# Patient Record
Sex: Male | Born: 1950 | Race: White | Smoking: Current every day smoker
Health system: Southern US, Community
[De-identification: ages and names within clinical notes are randomized; demographics above are authoritative.]

## PROBLEM LIST (undated history)

## (undated) DIAGNOSIS — F329 Major depressive disorder, single episode, unspecified: Secondary | ICD-10-CM

## (undated) DIAGNOSIS — M5386 Other specified dorsopathies, lumbar region: Secondary | ICD-10-CM

## (undated) DIAGNOSIS — K922 Gastrointestinal hemorrhage, unspecified: Secondary | ICD-10-CM

## (undated) DIAGNOSIS — F32A Depression, unspecified: Secondary | ICD-10-CM

## (undated) DIAGNOSIS — M62838 Other muscle spasm: Secondary | ICD-10-CM

## (undated) DIAGNOSIS — F1010 Alcohol abuse, uncomplicated: Secondary | ICD-10-CM

## (undated) DIAGNOSIS — M489 Spondylopathy, unspecified: Secondary | ICD-10-CM

## (undated) DIAGNOSIS — N189 Chronic kidney disease, unspecified: Secondary | ICD-10-CM

## (undated) DIAGNOSIS — F419 Anxiety disorder, unspecified: Secondary | ICD-10-CM

## (undated) DIAGNOSIS — N400 Enlarged prostate without lower urinary tract symptoms: Secondary | ICD-10-CM

## (undated) HISTORY — PX: ELBOW SURGERY: SHX618

## (undated) HISTORY — DX: Anxiety disorder, unspecified: F41.9

## (undated) HISTORY — DX: Other specified dorsopathies, lumbar region: M53.86

## (undated) HISTORY — DX: Chronic kidney disease, unspecified: N18.9

## (undated) HISTORY — DX: Spondylopathy, unspecified: M48.9

---

## 2012-07-04 ENCOUNTER — Other Ambulatory Visit: Payer: Self-pay | Admitting: Specialist

## 2012-07-04 DIAGNOSIS — M545 Low back pain: Secondary | ICD-10-CM

## 2012-07-04 DIAGNOSIS — M79606 Pain in leg, unspecified: Secondary | ICD-10-CM

## 2012-07-10 ENCOUNTER — Other Ambulatory Visit: Payer: Self-pay

## 2012-07-10 ENCOUNTER — Ambulatory Visit
Admission: RE | Admit: 2012-07-10 | Discharge: 2012-07-10 | Disposition: A | Discharge status: Home / Self Care | Payer: Worker's Compensation | Source: Ambulatory Visit | Attending: Specialist | Admitting: Specialist

## 2012-07-10 DIAGNOSIS — M79606 Pain in leg, unspecified: Secondary | ICD-10-CM

## 2012-07-10 DIAGNOSIS — M545 Low back pain: Secondary | ICD-10-CM

## 2012-07-15 ENCOUNTER — Ambulatory Visit
Admission: RE | Admit: 2012-07-15 | Discharge: 2012-07-15 | Disposition: A | Discharge status: Home / Self Care | Payer: Worker's Compensation | Source: Ambulatory Visit | Attending: Specialist | Admitting: Specialist

## 2012-07-15 VITALS — BP 138/70 | HR 73

## 2012-07-15 DIAGNOSIS — M542 Cervicalgia: Secondary | ICD-10-CM

## 2012-07-15 DIAGNOSIS — M549 Dorsalgia, unspecified: Secondary | ICD-10-CM

## 2012-07-15 MED ORDER — DIAZEPAM 5 MG PO TABS
10.0000 mg | ORAL_TABLET | Freq: Once | ORAL | Status: AC
  Administered 2012-07-15: 10 mg via ORAL

## 2012-07-15 MED ORDER — IOHEXOL 300 MG/ML  SOLN
10.0000 mL | Freq: Once | INTRAMUSCULAR | Status: AC | PRN
  Administered 2012-07-15: 10 mL via INTRATHECAL

## 2012-07-15 MED ORDER — HYDROXYZINE HCL 50 MG/ML IM SOLN
25.0000 mg | Freq: Four times a day (QID) | INTRAMUSCULAR | Status: DC | PRN
  Administered 2012-07-15: 25 mg via INTRAMUSCULAR

## 2012-07-15 MED ORDER — HYDROMORPHONE HCL PF 2 MG/ML IJ SOLN
2.0000 mg | Freq: Once | INTRAMUSCULAR | Status: AC
  Administered 2012-07-15: 2 mg via INTRAMUSCULAR

## 2013-08-03 ENCOUNTER — Other Ambulatory Visit: Payer: Self-pay | Admitting: Neurology

## 2013-08-03 DIAGNOSIS — M542 Cervicalgia: Secondary | ICD-10-CM

## 2013-08-11 ENCOUNTER — Other Ambulatory Visit: Payer: Self-pay | Admitting: Neurology

## 2013-08-11 ENCOUNTER — Ambulatory Visit
Admission: RE | Admit: 2013-08-11 | Discharge: 2013-08-11 | Disposition: A | Discharge status: Home / Self Care | Payer: Worker's Compensation | Source: Ambulatory Visit | Attending: Neurology | Admitting: Neurology

## 2013-08-11 DIAGNOSIS — M542 Cervicalgia: Secondary | ICD-10-CM

## 2013-08-11 MED ORDER — IOHEXOL 300 MG/ML  SOLN
1.0000 mL | Freq: Once | INTRAMUSCULAR | Status: AC | PRN
  Administered 2013-08-11: 1 mL via INTRAVENOUS

## 2013-08-11 MED ORDER — DEXAMETHASONE SODIUM PHOSPHATE 4 MG/ML IJ SOLN
6.0000 mg | Freq: Once | INTRAMUSCULAR | Status: AC
  Administered 2013-08-11: 6 mg via INTRA_ARTICULAR

## 2015-03-31 DIAGNOSIS — H2513 Age-related nuclear cataract, bilateral: Secondary | ICD-10-CM | POA: Diagnosis not present

## 2015-03-31 DIAGNOSIS — H524 Presbyopia: Secondary | ICD-10-CM | POA: Diagnosis not present

## 2015-03-31 DIAGNOSIS — H52203 Unspecified astigmatism, bilateral: Secondary | ICD-10-CM | POA: Diagnosis not present

## 2015-03-31 DIAGNOSIS — H5203 Hypermetropia, bilateral: Secondary | ICD-10-CM | POA: Diagnosis not present

## 2015-04-11 ENCOUNTER — Other Ambulatory Visit: Payer: Self-pay | Admitting: Neurology

## 2015-04-11 DIAGNOSIS — H5462 Unqualified visual loss, left eye, normal vision right eye: Secondary | ICD-10-CM

## 2015-04-13 ENCOUNTER — Ambulatory Visit
Admission: RE | Admit: 2015-04-13 | Discharge: 2015-04-13 | Disposition: A | Discharge status: Home / Self Care | Payer: Medicare Other | Source: Ambulatory Visit | Attending: Neurology | Admitting: Neurology

## 2015-04-13 DIAGNOSIS — I6523 Occlusion and stenosis of bilateral carotid arteries: Secondary | ICD-10-CM | POA: Diagnosis not present

## 2015-04-13 DIAGNOSIS — H5462 Unqualified visual loss, left eye, normal vision right eye: Secondary | ICD-10-CM

## 2015-10-17 ENCOUNTER — Encounter: Payer: Self-pay | Admitting: Family Medicine

## 2015-10-17 ENCOUNTER — Ambulatory Visit (INDEPENDENT_AMBULATORY_CARE_PROVIDER_SITE_OTHER): Payer: Medicare Other | Admitting: Physician Assistant

## 2015-10-17 ENCOUNTER — Encounter: Payer: Self-pay | Admitting: Physician Assistant

## 2015-10-17 VITALS — BP 132/76 | HR 84 | Temp 98.2°F | Resp 18 | Ht 74.0 in | Wt 237.0 lb

## 2015-10-17 DIAGNOSIS — N529 Male erectile dysfunction, unspecified: Secondary | ICD-10-CM

## 2015-10-17 DIAGNOSIS — Z720 Tobacco use: Secondary | ICD-10-CM | POA: Diagnosis not present

## 2015-10-17 DIAGNOSIS — Z125 Encounter for screening for malignant neoplasm of prostate: Secondary | ICD-10-CM | POA: Diagnosis not present

## 2015-10-17 DIAGNOSIS — Z0000 Encounter for general adult medical examination without abnormal findings: Secondary | ICD-10-CM

## 2015-10-17 DIAGNOSIS — M5386 Other specified dorsopathies, lumbar region: Secondary | ICD-10-CM | POA: Insufficient documentation

## 2015-10-17 DIAGNOSIS — Z7189 Other specified counseling: Secondary | ICD-10-CM

## 2015-10-17 DIAGNOSIS — G894 Chronic pain syndrome: Secondary | ICD-10-CM | POA: Diagnosis not present

## 2015-10-17 DIAGNOSIS — Z23 Encounter for immunization: Secondary | ICD-10-CM | POA: Diagnosis not present

## 2015-10-17 DIAGNOSIS — R5383 Other fatigue: Secondary | ICD-10-CM | POA: Diagnosis not present

## 2015-10-17 DIAGNOSIS — Z7689 Persons encountering health services in other specified circumstances: Secondary | ICD-10-CM

## 2015-10-17 DIAGNOSIS — F419 Anxiety disorder, unspecified: Secondary | ICD-10-CM | POA: Insufficient documentation

## 2015-10-17 DIAGNOSIS — M489 Spondylopathy, unspecified: Secondary | ICD-10-CM | POA: Insufficient documentation

## 2015-10-17 DIAGNOSIS — F17200 Nicotine dependence, unspecified, uncomplicated: Secondary | ICD-10-CM | POA: Insufficient documentation

## 2015-10-17 LAB — CBC WITH DIFFERENTIAL/PLATELET
Basophils Absolute: 0.1 10*3/uL (ref 0.0–0.1)
Basophils Relative: 1 % (ref 0–1)
EOS ABS: 0.3 10*3/uL (ref 0.0–0.7)
EOS PCT: 3 % (ref 0–5)
HEMATOCRIT: 48.3 % (ref 39.0–52.0)
Hemoglobin: 16.7 g/dL (ref 13.0–17.0)
LYMPHS PCT: 43 % (ref 12–46)
Lymphs Abs: 4.1 10*3/uL — ABNORMAL HIGH (ref 0.7–4.0)
MCH: 31.4 pg (ref 26.0–34.0)
MCHC: 34.6 g/dL (ref 30.0–36.0)
MCV: 90.8 fL (ref 78.0–100.0)
MONO ABS: 0.6 10*3/uL (ref 0.1–1.0)
MPV: 9.2 fL (ref 8.6–12.4)
Monocytes Relative: 6 % (ref 3–12)
Neutro Abs: 4.5 10*3/uL (ref 1.7–7.7)
Neutrophils Relative %: 47 % (ref 43–77)
Platelets: 213 10*3/uL (ref 150–400)
RBC: 5.32 MIL/uL (ref 4.22–5.81)
RDW: 13.4 % (ref 11.5–15.5)
WBC: 9.5 10*3/uL (ref 4.0–10.5)

## 2015-10-17 LAB — COMPLETE METABOLIC PANEL WITH GFR
ALT: 16 U/L (ref 9–46)
AST: 16 U/L (ref 10–35)
Albumin: 4.3 g/dL (ref 3.6–5.1)
Alkaline Phosphatase: 53 U/L (ref 40–115)
BUN: 10 mg/dL (ref 7–25)
CALCIUM: 9.5 mg/dL (ref 8.6–10.3)
CHLORIDE: 102 mmol/L (ref 98–110)
CO2: 26 mmol/L (ref 20–31)
Creat: 0.99 mg/dL (ref 0.70–1.25)
GFR, Est Non African American: 80 mL/min (ref >=60)
Glucose, Bld: 102 mg/dL — ABNORMAL HIGH (ref 70–99)
POTASSIUM: 4.4 mmol/L (ref 3.5–5.3)
Sodium: 137 mmol/L (ref 135–146)
Total Bilirubin: 0.6 mg/dL (ref 0.2–1.2)
Total Protein: 7.1 g/dL (ref 6.1–8.1)

## 2015-10-17 LAB — LIPID PANEL
CHOLESTEROL: 196 mg/dL (ref 125–200)
HDL: 29 mg/dL — AB (ref >=40)
LDL CALC: 98 mg/dL (ref <130)
TRIGLYCERIDES: 344 mg/dL — AB (ref <150)
Total CHOL/HDL Ratio: 6.8 Ratio — ABNORMAL HIGH (ref <=5.0)
VLDL: 69 mg/dL — AB (ref <30)

## 2015-10-17 LAB — TSH: TSH: 0.95 m[IU]/L (ref 0.40–4.50)

## 2015-10-17 MED ORDER — VARENICLINE TARTRATE 1 MG PO TABS: 1.0000 mg | ORAL_TABLET | Freq: Two times a day (BID) | ORAL | Status: DC

## 2015-10-17 MED ORDER — VARENICLINE TARTRATE 0.5 MG PO TABS: 0.5000 mg | ORAL_TABLET | Freq: Two times a day (BID) | ORAL | Status: DC
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
## 2015-10-18 LAB — VITAMIN D 25 HYDROXY (VIT D DEFICIENCY, FRACTURES): Vit D, 25-Hydroxy: 14 ng/mL — ABNORMAL LOW (ref 30–100)

## 2015-10-18 LAB — PSA, MEDICARE: PSA: 3.69 ng/mL (ref <=4.00)

## 2015-10-18 LAB — TESTOSTERONE: Testosterone: 224 ng/dL — ABNORMAL LOW (ref 250–827)

## 2015-10-21 ENCOUNTER — Other Ambulatory Visit: Payer: Medicare Other

## 2015-10-21 ENCOUNTER — Encounter: Payer: Self-pay | Admitting: Family Medicine

## 2015-10-21 DIAGNOSIS — E559 Vitamin D deficiency, unspecified: Secondary | ICD-10-CM

## 2015-10-21 DIAGNOSIS — E291 Testicular hypofunction: Secondary | ICD-10-CM | POA: Diagnosis not present

## 2015-10-21 DIAGNOSIS — R7989 Other specified abnormal findings of blood chemistry: Secondary | ICD-10-CM

## 2015-10-21 LAB — PROLACTIN: Prolactin: 6.1 ng/mL (ref 2.0–18.0)

## 2015-10-21 LAB — TESTOSTERONE: Testosterone: 241 ng/dL — ABNORMAL LOW (ref 250–827)

## 2015-10-21 LAB — FSH/LH
FSH: 8.6 m[IU]/mL — AB (ref 1.6–8.0)
LH: 9.4 m[IU]/mL (ref 1.6–15.2)

## 2015-10-26 ENCOUNTER — Other Ambulatory Visit: Payer: Self-pay | Admitting: Family Medicine

## 2015-10-26 ENCOUNTER — Encounter: Payer: Self-pay | Admitting: Gastroenterology

## 2015-10-26 DIAGNOSIS — E349 Endocrine disorder, unspecified: Secondary | ICD-10-CM | POA: Insufficient documentation

## 2015-10-26 MED ORDER — TESTOSTERONE 20.25 MG/ACT (1.62%) TD GEL: TRANSDERMAL | Status: DC

## 2015-11-30 ENCOUNTER — Telehealth: Payer: Self-pay | Admitting: Family Medicine

## 2015-11-30 MED ORDER — DIAZEPAM 5 MG PO TABS: 5.0000 mg | ORAL_TABLET | Freq: Three times a day (TID) | ORAL | Status: DC | PRN

## 2015-11-30 NOTE — Telephone Encounter (Signed)
Daughter calling, patient's wife just passed and funeral is toady.  Barry Mccoy is very distraught.  Can we order something to help calm him through this time?

## 2015-11-30 NOTE — Telephone Encounter (Signed)
Daughter aware of RX.  Extended our condolences.

## 2015-11-30 NOTE — Telephone Encounter (Signed)
Rx has been called in, left message for daughter to call back.

## 2015-11-30 NOTE — Telephone Encounter (Signed)
Valium 5 mg 1 po Q 8 hours PRN # 40 + 0 Tell him to take each night to help with sleep and PRN during day.  Sorry for his loss.

## 2015-12-02 ENCOUNTER — Other Ambulatory Visit: Payer: Self-pay | Admitting: *Deleted

## 2015-12-02 DIAGNOSIS — E349 Endocrine disorder, unspecified: Secondary | ICD-10-CM

## 2015-12-02 NOTE — Telephone Encounter (Signed)
Pt came into office stating he has been presribe Androgel and instructions states 4 pumps a day which only gives him enough for 15 days. Pt wants to know if you prescribe him a 90 day supply.   Pharmacy [place] [facility]

## 2015-12-05 ENCOUNTER — Ambulatory Visit (AMBULATORY_SURGERY_CENTER): Payer: Self-pay | Admitting: *Deleted

## 2015-12-05 VITALS — Ht 74.0 in | Wt 243.0 lb

## 2015-12-05 DIAGNOSIS — Z800 Family history of malignant neoplasm of digestive organs: Secondary | ICD-10-CM

## 2015-12-05 DIAGNOSIS — Z1211 Encounter for screening for malignant neoplasm of colon: Secondary | ICD-10-CM

## 2015-12-05 MED ORDER — NA SULFATE-K SULFATE-MG SULF 17.5-3.13-1.6 GM/177ML PO SOLN: 1.0000 | Freq: Once | ORAL | Status: DC

## 2015-12-05 NOTE — Progress Notes (Signed)
No egg or soy allergy known to patient  No issues with past sedation with any surgeries  or procedures, no intubation problems -only surgery was as a child No diet pills per patient No home 02 use per patient  No blood thinners per patient  Pt denies issues with constipation

## 2015-12-05 NOTE — Telephone Encounter (Signed)
OK to send 90 day supply 

## 2015-12-06 MED ORDER — TESTOSTERONE 20.25 MG/ACT (1.62%) TD GEL: TRANSDERMAL | Status: DC

## 2015-12-06 NOTE — Telephone Encounter (Signed)
Approved.  

## 2015-12-06 NOTE — Telephone Encounter (Signed)
rx printed, and will fax after signed in AM

## 2015-12-14 ENCOUNTER — Encounter: Payer: Self-pay | Admitting: Gastroenterology

## 2015-12-14 ENCOUNTER — Ambulatory Visit (AMBULATORY_SURGERY_CENTER): Payer: Medicare Other | Admitting: Gastroenterology

## 2015-12-14 VITALS — BP 90/58 | HR 72 | Temp 98.6°F | Resp 19 | Ht 74.0 in | Wt 243.0 lb

## 2015-12-14 DIAGNOSIS — D120 Benign neoplasm of cecum: Secondary | ICD-10-CM

## 2015-12-14 DIAGNOSIS — D125 Benign neoplasm of sigmoid colon: Secondary | ICD-10-CM | POA: Diagnosis not present

## 2015-12-14 DIAGNOSIS — Z800 Family history of malignant neoplasm of digestive organs: Secondary | ICD-10-CM

## 2015-12-14 DIAGNOSIS — Z1211 Encounter for screening for malignant neoplasm of colon: Secondary | ICD-10-CM

## 2015-12-14 DIAGNOSIS — N189 Chronic kidney disease, unspecified: Secondary | ICD-10-CM | POA: Diagnosis not present

## 2015-12-14 DIAGNOSIS — D123 Benign neoplasm of transverse colon: Secondary | ICD-10-CM | POA: Diagnosis not present

## 2015-12-14 DIAGNOSIS — G8929 Other chronic pain: Secondary | ICD-10-CM | POA: Diagnosis not present

## 2015-12-14 MED ORDER — SODIUM CHLORIDE 0.9 % IV SOLN: 500.0000 mL | INTRAVENOUS | Status: DC

## 2015-12-14 NOTE — Progress Notes (Signed)
Called to room to assist during endoscopic procedure.  Patient ID and intended procedure confirmed with present staff. Received instructions for my participation in the procedure from the performing physician.  

## 2015-12-14 NOTE — Progress Notes (Signed)
To pacu vss patent aw reprot to rn 

## 2015-12-14 NOTE — Patient Instructions (Signed)
YOU HAD AN ENDOSCOPIC PROCEDURE TODAY AT [facility]:   Refer to the procedure report that was given to you for any specific questions about what was found during the examination.  If the procedure report does not answer your questions, please call your gastroenterologist to clarify.  If you requested that your care partner not be given the details of your procedure findings, then the procedure report has been included in a sealed envelope for you to review at your convenience later.  YOU SHOULD EXPECT: Some feelings of bloating in the abdomen. Passage of more gas than usual.  Walking can help get rid of the air that was put into your GI tract during the procedure and reduce the bloating. If you had a lower endoscopy (such as a colonoscopy or flexible sigmoidoscopy) you may notice spotting of blood in your stool or on the toilet paper. If you underwent a bowel prep for your procedure, you may not have a normal bowel movement for a few days.  Please Note:  You might notice some irritation and congestion in your nose or some drainage.  This is from the oxygen used during your procedure.  There is no need for concern and it should clear up in a day or so.  SYMPTOMS TO REPORT IMMEDIATELY:   Following lower endoscopy (colonoscopy or flexible sigmoidoscopy):  Excessive amounts of blood in the stool  Significant tenderness or worsening of abdominal pains  Swelling of the abdomen that is new, acute  Fever of 100F or higher   For urgent or emergent issues, a gastroenterologist can be reached at any hour by calling [phone].   DIET: Your first meal following the procedure should be a small meal and then it is ok to progress to your normal diet. Heavy or fried foods are harder to digest and may make you feel nauseous or bloated.  Likewise, meals heavy in dairy and vegetables can increase bloating.  Drink plenty of fluids but you should avoid alcoholic beverages for 24  hours.  ACTIVITY:  You should plan to take it easy for the rest of today and you should NOT DRIVE or use heavy machinery until tomorrow (because of the sedation medicines used during the test).    FOLLOW UP: Our staff will call the number listed on your records the next business day following your procedure to check on you and address any questions or concerns that you may have regarding the information given to you following your procedure. If we do not reach you, we will leave a message.  However, if you are feeling well and you are not experiencing any problems, there is no need to return our call.  We will assume that you have returned to your regular daily activities without incident.  If any biopsies were taken you will be contacted by phone or by letter within the next 1-3 weeks.  Please call us at [phone] if you have not heard about the biopsies in 3 weeks.    SIGNATURES/CONFIDENTIALITY: You and/or your care partner have signed paperwork which will be entered into your electronic medical record.  These signatures attest to the fact that that the information above on your After Visit Summary has been reviewed and is understood.  Full responsibility of the confidentiality of this discharge information lies with you and/or your care-partner.  4 polyps removed and sent to pathology.  Await pathology for final recommendations.   No Aspirin or aspirin containing products for 2 weeks.

## 2015-12-14 NOTE — Op Note (Signed)
[facility] Patient Name: Hoyle Montero Procedure Date: 12/14/2015 11:44 AM MRN: [id number] Endoscopist: Remo Lipps P. Havery Moros , MD Age: 65 Referring MD:  Date of Birth: 01-07-1951 Gender: Male Procedure:                Colonoscopy Indications:              Screening in patient at increased risk: Family                            history of 1st-degree relative with colorectal                            cancer (mother around age 22), first time exam Medicines:                Monitored Anesthesia Care Procedure:                Pre-Anesthesia Assessment:                           - Prior to the procedure, a History and Physical                            was performed, and patient medications and                            allergies were reviewed. The patient's tolerance of                            previous anesthesia was also reviewed. The risks                            and benefits of the procedure and the sedation                            options and risks were discussed with the patient.                            All questions were answered, and informed consent                            was obtained. Prior Anticoagulants: The patient has                            taken no previous anticoagulant or antiplatelet                            agents. ASA Grade Assessment: III - A patient with                            severe systemic disease. After reviewing the risks                            and benefits, the patient was deemed in  satisfactory condition to undergo the procedure.                           After obtaining informed consent, the colonoscope                            was passed under direct vision. Throughout the                            procedure, the patient's blood pressure, pulse, and                            oxygen saturations were monitored continuously. The                            Model CF-HQ190L [id number]) scope  was introduced                            through the anus and advanced to the the cecum,                            identified by appendiceal orifice and ileocecal                            valve. The colonoscopy was performed without                            difficulty. The patient tolerated the procedure                            well. The quality of the bowel preparation was                            adequate. The ileocecal valve, appendiceal orifice,                            and rectum were photographed. Scope In: 11:54:15 AM Scope Out: 12:15:31 PM Scope Withdrawal Time: 0 hours 18 minutes 39 seconds  Total Procedure Duration: 0 hours 21 minutes 16 seconds  Findings:      The perianal and digital rectal examinations were normal.      A 5 mm polyp was found in the cecum. The polyp was sessile. The polyp       was removed with a cold snare. Resection and retrieval were complete.      A 10 mm polyp was found in the hepatic flexure. The polyp was sessile.       The polyp was removed with a cold snare. Resection and retrieval were       complete.      A 3 mm polyp was found in the transverse colon. The polyp was sessile.       The polyp was removed with a cold biopsy forceps. Resection and       retrieval were complete.      A 5 mm polyp was found in the sigmoid colon. The polyp was sessile. The  polyp was removed with a cold snare. Resection and retrieval were       complete.      Multiple small and large-mouthed diverticula were found in the left       colon. A few isolated diverticuli were noted in the right colon      Non-bleeding internal hemorrhoids were found during retroflexion. The       hemorrhoids were moderate.      The exam was otherwise normal throughout the examined colon. Complications:            No immediate complications. Estimated blood loss:                            Minimal. Estimated Blood Loss:     Estimated blood loss was minimal. Impression:                - One 5 mm polyp in the cecum, removed with a cold                            snare. Resected and retrieved.                           - One 10 mm polyp at the hepatic flexure, removed                            with a cold snare. Resected and retrieved.                           - One 3 mm polyp in the transverse colon, removed                            with a cold biopsy forceps. Resected and retrieved.                           - One 5 mm polyp in the sigmoid colon, removed with                            a cold snare. Resected and retrieved.                           - Diverticulosis in the left colon and right colon,                            highest burden in left colon                           - Non-bleeding internal hemorrhoids. Recommendation:           - Patient has a contact number available for                            emergencies. The signs and symptoms of potential                            delayed complications were discussed with the  patient. Return to normal activities tomorrow.                            Written discharge instructions were provided to the                            patient.                           - Resume previous diet.                           - Continue present medications.                           - No aspirin, ibuprofen, naproxen, or other                            non-steroidal anti-inflammatory drugs for 2 weeks                            after polyp removal.                           - Await pathology results.                           - Repeat colonoscopy is recommended for                            surveillance. The colonoscopy date will be                            determined after pathology results from today's                            exam become available for review. Remo Lipps P. Armbruster, MD 12/14/2015 12:20:29 PM This report has been signed electronically. Number of Addenda: 0 Referring  MD:      Lonie Peak. Dixon

## 2015-12-14 NOTE — Progress Notes (Signed)
No egg or soy allergy known to patient  No issues with past sedation with any surgeries  or procedures, no intubation problems  No diet pills per patient No home 02 use per patient  No blood thinners per patient  Pt denies issues with constipation   

## 2015-12-15 ENCOUNTER — Telehealth: Payer: Self-pay

## 2015-12-15 NOTE — Telephone Encounter (Signed)
  Follow up Call-  Call back number 12/14/2015  Post procedure Call Back phone  # [phone]  Permission to leave phone message Yes     Patient questions:  Do you have a fever, pain , or abdominal swelling? No. Pain Score  0 *  Have you tolerated food without any problems? Yes.    Have you been able to return to your normal activities? Yes.    Do you have any questions about your discharge instructions: Diet   No. Medications  No. Follow up visit  No.  Do you have questions or concerns about your Care? No.  Actions: * If pain score is 4 or above: No action needed, pain <4.

## 2015-12-24 ENCOUNTER — Encounter: Payer: Self-pay | Admitting: Gastroenterology

## 2016-04-19 ENCOUNTER — Ambulatory Visit (INDEPENDENT_AMBULATORY_CARE_PROVIDER_SITE_OTHER): Payer: Medicare Other | Admitting: Physician Assistant

## 2016-04-19 ENCOUNTER — Encounter: Payer: Self-pay | Admitting: Physician Assistant

## 2016-04-19 VITALS — BP 118/60 | HR 78 | Temp 98.4°F | Resp 16 | Ht 74.0 in | Wt 231.0 lb

## 2016-04-19 DIAGNOSIS — G894 Chronic pain syndrome: Secondary | ICD-10-CM | POA: Diagnosis not present

## 2016-04-19 DIAGNOSIS — E291 Testicular hypofunction: Secondary | ICD-10-CM | POA: Diagnosis not present

## 2016-04-19 DIAGNOSIS — M539 Dorsopathy, unspecified: Secondary | ICD-10-CM | POA: Diagnosis not present

## 2016-04-19 DIAGNOSIS — F17200 Nicotine dependence, unspecified, uncomplicated: Secondary | ICD-10-CM

## 2016-04-19 DIAGNOSIS — M5386 Other specified dorsopathies, lumbar region: Secondary | ICD-10-CM

## 2016-04-19 DIAGNOSIS — F419 Anxiety disorder, unspecified: Secondary | ICD-10-CM

## 2016-04-19 DIAGNOSIS — F329 Major depressive disorder, single episode, unspecified: Secondary | ICD-10-CM | POA: Diagnosis not present

## 2016-04-19 DIAGNOSIS — M489 Spondylopathy, unspecified: Secondary | ICD-10-CM

## 2016-04-19 DIAGNOSIS — E349 Endocrine disorder, unspecified: Secondary | ICD-10-CM

## 2016-04-19 DIAGNOSIS — E559 Vitamin D deficiency, unspecified: Secondary | ICD-10-CM | POA: Diagnosis not present

## 2016-04-19 DIAGNOSIS — F4321 Adjustment disorder with depressed mood: Secondary | ICD-10-CM | POA: Diagnosis not present

## 2016-04-19 DIAGNOSIS — Z720 Tobacco use: Secondary | ICD-10-CM

## 2016-04-19 DIAGNOSIS — F32A Depression, unspecified: Secondary | ICD-10-CM

## 2016-04-19 DIAGNOSIS — M438X2 Other specified deforming dorsopathies, cervical region: Secondary | ICD-10-CM | POA: Diagnosis not present

## 2016-04-19 DIAGNOSIS — F4320 Adjustment disorder, unspecified: Secondary | ICD-10-CM

## 2016-04-19 DIAGNOSIS — N400 Enlarged prostate without lower urinary tract symptoms: Secondary | ICD-10-CM

## 2016-04-19 MED ORDER — TAMSULOSIN HCL 0.4 MG PO CAPS: 0.4000 mg | ORAL_CAPSULE | Freq: Every day | ORAL | 3 refills | Status: DC

## 2016-04-19 MED ORDER — SERTRALINE HCL 50 MG PO TABS: 50.0000 mg | ORAL_TABLET | Freq: Every day | ORAL | 3 refills | Status: DC

## 2016-04-19 MED ORDER — DIAZEPAM 5 MG PO TABS: 5.0000 mg | ORAL_TABLET | Freq: Three times a day (TID) | ORAL | 0 refills | Status: DC | PRN
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
## 2016-05-31 ENCOUNTER — Encounter: Payer: Self-pay | Admitting: Physician Assistant

## 2016-05-31 ENCOUNTER — Ambulatory Visit (INDEPENDENT_AMBULATORY_CARE_PROVIDER_SITE_OTHER): Payer: Medicare Other | Admitting: Physician Assistant

## 2016-05-31 VITALS — BP 117/72 | HR 89 | Temp 97.6°F | Resp 16 | Wt 236.0 lb

## 2016-05-31 DIAGNOSIS — F419 Anxiety disorder, unspecified: Secondary | ICD-10-CM | POA: Diagnosis not present

## 2016-05-31 DIAGNOSIS — F4321 Adjustment disorder with depressed mood: Secondary | ICD-10-CM

## 2016-05-31 DIAGNOSIS — N400 Enlarged prostate without lower urinary tract symptoms: Secondary | ICD-10-CM

## 2016-05-31 DIAGNOSIS — F4320 Adjustment disorder, unspecified: Secondary | ICD-10-CM

## 2016-05-31 DIAGNOSIS — F329 Major depressive disorder, single episode, unspecified: Secondary | ICD-10-CM

## 2016-05-31 DIAGNOSIS — F32A Depression, unspecified: Secondary | ICD-10-CM

## 2016-05-31 MED ORDER — SERTRALINE HCL 50 MG PO TABS: 50.0000 mg | ORAL_TABLET | Freq: Every day | ORAL | 1 refills | Status: DC

## 2016-05-31 MED ORDER — TAMSULOSIN HCL 0.4 MG PO CAPS: 0.4000 mg | ORAL_CAPSULE | Freq: Every day | ORAL | 1 refills | Status: DC
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
## 2016-10-01 ENCOUNTER — Inpatient Hospital Stay (HOSPITAL_COMMUNITY)
Admission: EM | Admit: 2016-10-01 | Discharge: 2016-10-23 | DRG: 811 | Disposition: A | Discharge status: Skilled Nursing Facility | Payer: Medicare Other | Attending: Internal Medicine | Admitting: Internal Medicine

## 2016-10-01 ENCOUNTER — Emergency Department (HOSPITAL_COMMUNITY): Payer: Medicare Other

## 2016-10-01 ENCOUNTER — Encounter (HOSPITAL_COMMUNITY): Payer: Self-pay | Admitting: Emergency Medicine

## 2016-10-01 DIAGNOSIS — E861 Hypovolemia: Secondary | ICD-10-CM | POA: Diagnosis present

## 2016-10-01 DIAGNOSIS — Z452 Encounter for adjustment and management of vascular access device: Secondary | ICD-10-CM | POA: Diagnosis not present

## 2016-10-01 DIAGNOSIS — F333 Major depressive disorder, recurrent, severe with psychotic symptoms: Secondary | ICD-10-CM | POA: Diagnosis not present

## 2016-10-01 DIAGNOSIS — K921 Melena: Secondary | ICD-10-CM

## 2016-10-01 DIAGNOSIS — J690 Pneumonitis due to inhalation of food and vomit: Secondary | ICD-10-CM | POA: Diagnosis not present

## 2016-10-01 DIAGNOSIS — G2581 Restless legs syndrome: Secondary | ICD-10-CM | POA: Diagnosis present

## 2016-10-01 DIAGNOSIS — Z811 Family history of alcohol abuse and dependence: Secondary | ICD-10-CM | POA: Diagnosis not present

## 2016-10-01 DIAGNOSIS — Z01818 Encounter for other preprocedural examination: Secondary | ICD-10-CM

## 2016-10-01 DIAGNOSIS — R339 Retention of urine, unspecified: Secondary | ICD-10-CM | POA: Diagnosis not present

## 2016-10-01 DIAGNOSIS — F1123 Opioid dependence with withdrawal: Secondary | ICD-10-CM | POA: Diagnosis present

## 2016-10-01 DIAGNOSIS — N189 Chronic kidney disease, unspecified: Secondary | ICD-10-CM | POA: Diagnosis present

## 2016-10-01 DIAGNOSIS — E860 Dehydration: Secondary | ICD-10-CM | POA: Diagnosis present

## 2016-10-01 DIAGNOSIS — Z86010 Personal history of colonic polyps: Secondary | ICD-10-CM

## 2016-10-01 DIAGNOSIS — Z79899 Other long term (current) drug therapy: Secondary | ICD-10-CM | POA: Diagnosis not present

## 2016-10-01 DIAGNOSIS — Z800 Family history of malignant neoplasm of digestive organs: Secondary | ICD-10-CM

## 2016-10-01 DIAGNOSIS — R404 Transient alteration of awareness: Secondary | ICD-10-CM | POA: Diagnosis not present

## 2016-10-01 DIAGNOSIS — J9690 Respiratory failure, unspecified, unspecified whether with hypoxia or hypercapnia: Secondary | ICD-10-CM

## 2016-10-01 DIAGNOSIS — J811 Chronic pulmonary edema: Secondary | ICD-10-CM | POA: Diagnosis present

## 2016-10-01 DIAGNOSIS — R000 Tachycardia, unspecified: Secondary | ICD-10-CM

## 2016-10-01 DIAGNOSIS — K8020 Calculus of gallbladder without cholecystitis without obstruction: Secondary | ICD-10-CM | POA: Diagnosis not present

## 2016-10-01 DIAGNOSIS — K668 Other specified disorders of peritoneum: Secondary | ICD-10-CM | POA: Diagnosis not present

## 2016-10-01 DIAGNOSIS — K2970 Gastritis, unspecified, without bleeding: Secondary | ICD-10-CM | POA: Diagnosis present

## 2016-10-01 DIAGNOSIS — R410 Disorientation, unspecified: Secondary | ICD-10-CM

## 2016-10-01 DIAGNOSIS — D638 Anemia in other chronic diseases classified elsewhere: Secondary | ICD-10-CM | POA: Diagnosis present

## 2016-10-01 DIAGNOSIS — J9600 Acute respiratory failure, unspecified whether with hypoxia or hypercapnia: Secondary | ICD-10-CM

## 2016-10-01 DIAGNOSIS — K3189 Other diseases of stomach and duodenum: Secondary | ICD-10-CM | POA: Diagnosis present

## 2016-10-01 DIAGNOSIS — R778 Other specified abnormalities of plasma proteins: Secondary | ICD-10-CM

## 2016-10-01 DIAGNOSIS — G9349 Other encephalopathy: Secondary | ICD-10-CM | POA: Diagnosis present

## 2016-10-01 DIAGNOSIS — N3000 Acute cystitis without hematuria: Secondary | ICD-10-CM | POA: Diagnosis not present

## 2016-10-01 DIAGNOSIS — I959 Hypotension, unspecified: Secondary | ICD-10-CM | POA: Diagnosis present

## 2016-10-01 DIAGNOSIS — I4891 Unspecified atrial fibrillation: Secondary | ICD-10-CM | POA: Diagnosis present

## 2016-10-01 DIAGNOSIS — K922 Gastrointestinal hemorrhage, unspecified: Secondary | ICD-10-CM | POA: Diagnosis not present

## 2016-10-01 DIAGNOSIS — Z91013 Allergy to seafood: Secondary | ICD-10-CM | POA: Diagnosis not present

## 2016-10-01 DIAGNOSIS — G312 Degeneration of nervous system due to alcohol: Secondary | ICD-10-CM | POA: Diagnosis present

## 2016-10-01 DIAGNOSIS — Z7982 Long term (current) use of aspirin: Secondary | ICD-10-CM

## 2016-10-01 DIAGNOSIS — R0902 Hypoxemia: Secondary | ICD-10-CM | POA: Diagnosis present

## 2016-10-01 DIAGNOSIS — K766 Portal hypertension: Secondary | ICD-10-CM | POA: Diagnosis present

## 2016-10-01 DIAGNOSIS — F17210 Nicotine dependence, cigarettes, uncomplicated: Secondary | ICD-10-CM | POA: Diagnosis present

## 2016-10-01 DIAGNOSIS — Z8042 Family history of malignant neoplasm of prostate: Secondary | ICD-10-CM | POA: Diagnosis not present

## 2016-10-01 DIAGNOSIS — R161 Splenomegaly, not elsewhere classified: Secondary | ICD-10-CM | POA: Diagnosis present

## 2016-10-01 DIAGNOSIS — F0390 Unspecified dementia without behavioral disturbance: Secondary | ICD-10-CM | POA: Diagnosis present

## 2016-10-01 DIAGNOSIS — K529 Noninfective gastroenteritis and colitis, unspecified: Secondary | ICD-10-CM | POA: Diagnosis present

## 2016-10-01 DIAGNOSIS — Z4682 Encounter for fitting and adjustment of non-vascular catheter: Secondary | ICD-10-CM | POA: Diagnosis not present

## 2016-10-01 DIAGNOSIS — F322 Major depressive disorder, single episode, severe without psychotic features: Secondary | ICD-10-CM | POA: Diagnosis present

## 2016-10-01 DIAGNOSIS — D509 Iron deficiency anemia, unspecified: Secondary | ICD-10-CM | POA: Diagnosis not present

## 2016-10-01 DIAGNOSIS — Z9889 Other specified postprocedural states: Secondary | ICD-10-CM | POA: Diagnosis not present

## 2016-10-01 DIAGNOSIS — Z87442 Personal history of urinary calculi: Secondary | ICD-10-CM

## 2016-10-01 DIAGNOSIS — D62 Acute posthemorrhagic anemia: Secondary | ICD-10-CM | POA: Diagnosis not present

## 2016-10-01 DIAGNOSIS — R55 Syncope and collapse: Secondary | ICD-10-CM

## 2016-10-01 DIAGNOSIS — R109 Unspecified abdominal pain: Secondary | ICD-10-CM | POA: Diagnosis not present

## 2016-10-01 DIAGNOSIS — I471 Supraventricular tachycardia: Secondary | ICD-10-CM | POA: Diagnosis not present

## 2016-10-01 DIAGNOSIS — F323 Major depressive disorder, single episode, severe with psychotic features: Secondary | ICD-10-CM

## 2016-10-01 DIAGNOSIS — E876 Hypokalemia: Secondary | ICD-10-CM | POA: Diagnosis not present

## 2016-10-01 DIAGNOSIS — J9601 Acute respiratory failure with hypoxia: Secondary | ICD-10-CM | POA: Diagnosis not present

## 2016-10-01 DIAGNOSIS — E872 Acidosis: Secondary | ICD-10-CM | POA: Diagnosis present

## 2016-10-01 DIAGNOSIS — R7989 Other specified abnormal findings of blood chemistry: Secondary | ICD-10-CM | POA: Diagnosis not present

## 2016-10-01 DIAGNOSIS — R531 Weakness: Secondary | ICD-10-CM | POA: Diagnosis not present

## 2016-10-01 DIAGNOSIS — M545 Low back pain: Secondary | ICD-10-CM | POA: Diagnosis present

## 2016-10-01 DIAGNOSIS — F10231 Alcohol dependence with withdrawal delirium: Secondary | ICD-10-CM | POA: Diagnosis present

## 2016-10-01 DIAGNOSIS — R319 Hematuria, unspecified: Secondary | ICD-10-CM | POA: Diagnosis not present

## 2016-10-01 DIAGNOSIS — D649 Anemia, unspecified: Secondary | ICD-10-CM | POA: Diagnosis present

## 2016-10-01 DIAGNOSIS — N390 Urinary tract infection, site not specified: Secondary | ICD-10-CM | POA: Diagnosis not present

## 2016-10-01 DIAGNOSIS — Z781 Physical restraint status: Secondary | ICD-10-CM

## 2016-10-01 DIAGNOSIS — Z9911 Dependence on respirator [ventilator] status: Secondary | ICD-10-CM | POA: Diagnosis not present

## 2016-10-01 DIAGNOSIS — R933 Abnormal findings on diagnostic imaging of other parts of digestive tract: Secondary | ICD-10-CM | POA: Diagnosis not present

## 2016-10-01 DIAGNOSIS — J40 Bronchitis, not specified as acute or chronic: Secondary | ICD-10-CM | POA: Diagnosis not present

## 2016-10-01 DIAGNOSIS — G9340 Encephalopathy, unspecified: Secondary | ICD-10-CM | POA: Diagnosis not present

## 2016-10-01 DIAGNOSIS — Z91048 Other nonmedicinal substance allergy status: Secondary | ICD-10-CM

## 2016-10-01 DIAGNOSIS — I864 Gastric varices: Secondary | ICD-10-CM | POA: Diagnosis present

## 2016-10-01 DIAGNOSIS — F419 Anxiety disorder, unspecified: Secondary | ICD-10-CM | POA: Diagnosis present

## 2016-10-01 DIAGNOSIS — I130 Hypertensive heart and chronic kidney disease with heart failure and stage 1 through stage 4 chronic kidney disease, or unspecified chronic kidney disease: Secondary | ICD-10-CM | POA: Diagnosis present

## 2016-10-01 DIAGNOSIS — Z836 Family history of other diseases of the respiratory system: Secondary | ICD-10-CM | POA: Diagnosis not present

## 2016-10-01 DIAGNOSIS — G8929 Other chronic pain: Secondary | ICD-10-CM | POA: Diagnosis present

## 2016-10-01 DIAGNOSIS — F05 Delirium due to known physiological condition: Secondary | ICD-10-CM | POA: Diagnosis not present

## 2016-10-01 DIAGNOSIS — R4182 Altered mental status, unspecified: Secondary | ICD-10-CM

## 2016-10-01 DIAGNOSIS — Z4659 Encounter for fitting and adjustment of other gastrointestinal appliance and device: Secondary | ICD-10-CM

## 2016-10-01 DIAGNOSIS — G43909 Migraine, unspecified, not intractable, without status migrainosus: Secondary | ICD-10-CM | POA: Diagnosis present

## 2016-10-01 DIAGNOSIS — M6281 Muscle weakness (generalized): Secondary | ICD-10-CM

## 2016-10-01 DIAGNOSIS — R195 Other fecal abnormalities: Secondary | ICD-10-CM | POA: Diagnosis not present

## 2016-10-01 DIAGNOSIS — Z978 Presence of other specified devices: Secondary | ICD-10-CM

## 2016-10-01 DIAGNOSIS — R9401 Abnormal electroencephalogram [EEG]: Secondary | ICD-10-CM | POA: Diagnosis present

## 2016-10-01 DIAGNOSIS — R0600 Dyspnea, unspecified: Secondary | ICD-10-CM | POA: Diagnosis not present

## 2016-10-01 DIAGNOSIS — N179 Acute kidney failure, unspecified: Secondary | ICD-10-CM | POA: Diagnosis present

## 2016-10-01 DIAGNOSIS — K7460 Unspecified cirrhosis of liver: Secondary | ICD-10-CM | POA: Diagnosis present

## 2016-10-01 HISTORY — DX: Other muscle spasm: M62.838

## 2016-10-01 LAB — I-STAT CG4 LACTIC ACID, ED: LACTIC ACID, VENOUS: 2.15 mmol/L — AB (ref 0.5–1.9)

## 2016-10-01 LAB — BASIC METABOLIC PANEL
ANION GAP: 9 (ref 5–15)
BUN: 16 mg/dL (ref 6–20)
CHLORIDE: 102 mmol/L (ref 101–111)
CO2: 23 mmol/L (ref 22–32)
Calcium: 8.6 mg/dL — ABNORMAL LOW (ref 8.9–10.3)
Creatinine, Ser: 1.21 mg/dL (ref 0.61–1.24)
GFR calc Af Amer: >60 mL/min (ref >60)
GFR calc non Af Amer: >60 mL/min (ref >60)
GLUCOSE: 111 mg/dL — AB (ref 65–99)
POTASSIUM: 3.9 mmol/L (ref 3.5–5.1)
Sodium: 134 mmol/L — ABNORMAL LOW (ref 135–145)

## 2016-10-01 LAB — LIPASE, BLOOD: Lipase: 40 U/L (ref 11–51)

## 2016-10-01 LAB — CBC
HEMATOCRIT: 13.9 % — AB (ref 39.0–52.0)
HEMOGLOBIN: 4.1 g/dL — AB (ref 13.0–17.0)
MCH: 22.4 pg — ABNORMAL LOW (ref 26.0–34.0)
MCHC: 29.5 g/dL — AB (ref 30.0–36.0)
MCV: 76 fL — AB (ref 78.0–100.0)
Platelets: 327 10*3/uL (ref 150–400)
RBC: 1.83 MIL/uL — ABNORMAL LOW (ref 4.22–5.81)
RDW: 19.5 % — AB (ref 11.5–15.5)
WBC: 10.3 10*3/uL (ref 4.0–10.5)

## 2016-10-01 LAB — HEPATIC FUNCTION PANEL
ALK PHOS: 62 U/L (ref 38–126)
ALT: 14 U/L — ABNORMAL LOW (ref 17–63)
AST: 18 U/L (ref 15–41)
Albumin: 3 g/dL — ABNORMAL LOW (ref 3.5–5.0)
Bilirubin, Direct: 0.1 mg/dL (ref 0.1–0.5)
Indirect Bilirubin: 0.3 mg/dL (ref 0.3–0.9)
TOTAL PROTEIN: 5.9 g/dL — AB (ref 6.5–8.1)
Total Bilirubin: 0.4 mg/dL (ref 0.3–1.2)

## 2016-10-01 LAB — PROTIME-INR
INR: 1.26
Prothrombin Time: 15.9 seconds — ABNORMAL HIGH (ref 11.4–15.2)

## 2016-10-01 LAB — PREPARE RBC (CROSSMATCH)

## 2016-10-01 MED ORDER — ACETAMINOPHEN 650 MG RE SUPP: 650.0000 mg | Freq: Four times a day (QID) | RECTAL | Status: DC | PRN

## 2016-10-01 MED ORDER — PANTOPRAZOLE SODIUM 40 MG IV SOLR: 40.0000 mg | Freq: Once | INTRAVENOUS | Status: DC

## 2016-10-01 MED ORDER — ONDANSETRON HCL 4 MG PO TABS: 4.0000 mg | ORAL_TABLET | Freq: Four times a day (QID) | ORAL | Status: DC | PRN

## 2016-10-01 MED ORDER — SODIUM CHLORIDE 0.9 % IV SOLN
80.0000 mg | Freq: Once | INTRAVENOUS | Status: AC
  Administered 2016-10-02: 01:00:00 80 mg via INTRAVENOUS
  Filled 2016-10-01: qty 80

## 2016-10-01 MED ORDER — SODIUM CHLORIDE 0.9 % IV SOLN
8.0000 mg/h | INTRAVENOUS | Status: DC
  Administered 2016-10-02 – 2016-10-04 (×5): 8 mg/h via INTRAVENOUS
  Filled 2016-10-01 (×9): qty 80

## 2016-10-01 MED ORDER — SODIUM CHLORIDE 0.9 % IV SOLN
INTRAVENOUS | Status: AC
  Administered 2016-10-02 (×2): via INTRAVENOUS

## 2016-10-01 MED ORDER — SODIUM CHLORIDE 0.9 % IV SOLN
1500.0000 mg | Freq: Every day | INTRAVENOUS | Status: DC
  Administered 2016-10-02 – 2016-10-09 (×9): 1500 mg via INTRAVENOUS
  Filled 2016-10-01 (×11): qty 15

## 2016-10-01 MED ORDER — IOPAMIDOL (ISOVUE-370) INJECTION 76%
INTRAVENOUS | Status: AC
  Administered 2016-10-01: 100 mL
  Filled 2016-10-01: qty 100

## 2016-10-01 MED ORDER — SODIUM CHLORIDE 0.9 % IV SOLN: 10.0000 mL/h | Freq: Once | INTRAVENOUS | Status: DC

## 2016-10-01 MED ORDER — PANTOPRAZOLE SODIUM 40 MG IV SOLR: 40.0000 mg | Freq: Two times a day (BID) | INTRAVENOUS | Status: DC

## 2016-10-01 MED ORDER — ONDANSETRON HCL 4 MG/2ML IJ SOLN: 4.0000 mg | Freq: Four times a day (QID) | INTRAMUSCULAR | Status: DC | PRN

## 2016-10-01 MED ORDER — ACETAMINOPHEN 325 MG PO TABS: 650.0000 mg | ORAL_TABLET | Freq: Four times a day (QID) | ORAL | Status: DC | PRN

## 2016-10-01 MED ORDER — SODIUM CHLORIDE 0.9 % IV BOLUS (SEPSIS)
1000.0000 mL | Freq: Once | INTRAVENOUS | Status: AC
  Administered 2016-10-01: 1000 mL via INTRAVENOUS

## 2016-10-01 NOTE — ED Triage Notes (Signed)
Pt brought to ED by [facility] from [facility] for increase weakness near syncope, pt very diaphoretic on first responder arrival SPO2 sat 88 on RA placed on NRM changed to 100 % pt then change to 2L [place] and keep saturation on 100%, initial BP 100/70, very pale, pt states he got only one episode of clear secretion vomit today and black stool for the past few day, last time feeling good 4 weeks ago. C/o 8/10 left side pain with hx of kidney stone, 2-3 runs of A fib on the monitor with no prior hx of that with a HR 110-130 that lasted for 15-20 sec on monitor, SR on monitor with frequent PAC, pt having a left side eye drop that pt states this is normal for him.

## 2016-10-01 NOTE — ED Notes (Signed)
Sent add on labels

## 2016-10-01 NOTE — ED Notes (Signed)
Nurse will draw labs. 

## 2016-10-01 NOTE — ED Provider Notes (Signed)
[facility] DEPT Provider Note   CSN: [id number] Arrival date & time: 10/01/16  1750     History   Chief Complaint Chief Complaint  Patient presents with  . Weakness  . Near Syncope    HPI Barry Mccoy is a 66 y.o. male.  HPI 66 year old male presenting with a near syncopal episode and intermittent dark stools. He states that for the past several months he has had intermittent dark stools. He is a very poor historian and difficult to get information from. He states that he does not have dark stools every day but he did have one today. He has had increasing fatigue over the past few days. He states that when he woke up this morning he "did not feel right" and was unable to explain further. He states that he went to [employer] to get Cat food and while walking around he thought he was going passout. He became diaphoretic and pale and very lightheaded. He states that he did not fall or hit his head. He slid down a wall this down. When EMS arrived he was supposedly 88% on room air and placed on a nonrebreather and changed to 2 L keeping it O2 sat of 100%. Initial BP on scene was 100/70. When asked if he has had chest pain. He has a very difficult time explaining but he states that he has had a "funny feeling in his chest" earlier today but denies any chest pain or that "funny feeling" at this time  Past Medical History:  Diagnosis Date  . Muscle spasm     There are no active problems to display for this patient.   History reviewed. No pertinent surgical history.     Home Medications    Prior to Admission medications   Medication Sig Start Date End Date Taking? Authorizing Provider  baclofen (LIORESAL) 20 MG tablet Take 40 mg by mouth at bedtime.   Yes Historical Provider, MD  Cholecalciferol (VITAMIN D-3) 5000 units TABS Take 5,000 Units by mouth at bedtime.   Yes Historical Provider, MD  levETIRAcetam (KEPPRA) 500 MG tablet Take 1,500 mg by mouth at bedtime. For migraine  prevention   Yes Historical Provider, MD  lidocaine (LIDODERM) 5 % Place 2 patches onto the skin daily as needed (pain). Remove & Discard patch within 12 hours or as directed by MD   Yes Historical Provider, MD  Multiple Vitamin (MULTIVITAMIN WITH MINERALS) TABS tablet Take 1 tablet by mouth at bedtime.   Yes Historical Provider, MD  OnabotulinumtoxinA (BOTOX IJ) Inject as directed See admin instructions. Botox injections done at Dr. Audery Amel office approximately every 90 days   Yes Historical Provider, MD  sertraline (ZOLOFT) 50 MG tablet Take 50 mg by mouth at bedtime.   Yes Historical Provider, MD  tamsulosin (FLOMAX) 0.4 MG CAPS capsule Take 0.4 mg by mouth at bedtime.   Yes Historical Provider, MD  traZODone (DESYREL) 100 MG tablet Take 200 mg by mouth at bedtime.   Yes Historical Provider, MD    Family History History reviewed. No pertinent family history.  Social History Social History  Substance Use Topics  . Smoking status: Current Every Day Smoker    Packs/day: 1.00    Types: Cigarettes  . Smokeless tobacco: Never Used  . Alcohol use No     Allergies   Patient has no known allergies.   Review of Systems Review of Systems  Constitutional: Positive for fatigue. Negative for chills and fever.  HENT: Negative.   Respiratory:  Positive for shortness of breath. Negative for cough and wheezing.   Cardiovascular: Positive for chest pain. Negative for palpitations.  Gastrointestinal: Positive for blood in stool. Negative for abdominal pain, constipation, diarrhea, nausea and vomiting.  Genitourinary: Negative.   Musculoskeletal: Negative.   Skin: Negative.   Neurological: Negative for dizziness, weakness and light-headedness.  All other systems reviewed and are negative.    Physical Exam Updated Vital Signs BP 104/56   Pulse (!) 45   Temp 98.2 F (36.8 C) (Oral)   Resp 21   Ht 6\' 2"  (1.88 m)   Wt 63.5 kg   SpO2 (!) 70%   BMI 17.97 kg/m   Physical Exam    Constitutional: He is oriented to person, place, and time. He appears well-developed and well-nourished.  HENT:  Head: Normocephalic and atraumatic.  Eyes: EOM are normal. Pupils are equal, round, and reactive to light.  Neck: Normal range of motion. Neck supple.  Cardiovascular: Normal rate, regular rhythm, normal heart sounds and intact distal pulses.  Exam reveals no gallop and no friction rub.   No murmur heard. Pulmonary/Chest: Effort normal and breath sounds normal. No respiratory distress. He has no decreased breath sounds. He has no wheezes.  Abdominal: Soft. Bowel sounds are normal. He exhibits no distension. There is no tenderness. There is no rigidity, no rebound and no guarding.  Genitourinary: Rectal exam shows no mass and no tenderness.  Genitourinary Comments: Brown stool on rectal exam  Neurological: He is alert and oriented to person, place, and time. He has normal strength. No cranial nerve deficit or sensory deficit. GCS eye subscore is 4. GCS verbal subscore is 5. GCS motor subscore is 6.  Skin: Skin is warm. Capillary refill takes 2 to 3 seconds. There is pallor.  Nursing note and vitals reviewed.    ED Treatments / Results  Labs (all labs ordered are listed, but only abnormal results are displayed) Labs Reviewed  BASIC METABOLIC PANEL - Abnormal; Notable for the following:       Result Value   Sodium 134 (*)    Glucose, Bld 111 (*)    Calcium 8.6 (*)    All other components within normal limits  CBC - Abnormal; Notable for the following:    RBC 1.83 (*)    Hemoglobin 4.1 (*)    HCT 13.9 (*)    MCV 76.0 (*)    MCH 22.4 (*)    MCHC 29.5 (*)    RDW 19.5 (*)    All other components within normal limits  PROTIME-INR - Abnormal; Notable for the following:    Prothrombin Time 15.9 (*)    All other components within normal limits  HEPATIC FUNCTION PANEL - Abnormal; Notable for the following:    Total Protein 5.9 (*)    Albumin 3.0 (*)    ALT 14 (*)    All  other components within normal limits  LIPASE, BLOOD  URINALYSIS, ROUTINE W REFLEX MICROSCOPIC  CBG MONITORING, ED  POC OCCULT BLOOD, ED  TYPE AND SCREEN  ABO/RH  PREPARE RBC (CROSSMATCH)    EKG  EKG Interpretation  Date/Time:  Monday October 01 2016 17:52:27 EST Ventricular Rate:  82 PR Interval:    QRS Duration: 100 QT Interval:  415 QTC Calculation: 485 R Axis:   109 Text Interpretation:  Right and left arm electrode reversal, interpretation assumes no reversal Sinus arrhythmia Ventricular premature complex Probable lateral infarct, age indeterminate No significant change since last tracing Confirmed by Darl Householder  MD, DAVID ([id number]) on 10/01/2016 7:43:12 PM       Radiology Ct Angio Chest/abd/pel For Dissection W And/or Wo Contrast  Result Date: 10/01/2016 CLINICAL DATA:  66 year old male with left-sided flank pain, concerning for dissection. EXAM: CT ANGIOGRAPHY CHEST, ABDOMEN AND PELVIS TECHNIQUE: Multidetector CT imaging through the chest, abdomen and pelvis was performed using the standard protocol during bolus administration of intravenous contrast. Multiplanar reconstructed images including MIPs were obtained and reviewed to evaluate the vascular anatomy. CONTRAST:  100 cc Isovue 370 COMPARISON:  None. FINDINGS: CTA CHEST FINDINGS Cardiovascular: Heart: No cardiomegaly. No pericardial fluid/thickening. Calcifications of the left main, left anterior descending, circumflex coronary arteries. Aorta: Unremarkable course, caliber, contour of the thoracic aorta. No aneurysm or dissection flap. No periaortic fluid. Soft plaque of the aortic arch with minimal calcifications. Mixed atherosclerotic and soft plaque of the descending thoracic aorta. Pulmonary arteries: No central, lobar, segmental, or proximal subsegmental filling defects. Mediastinum/Nodes: Mediastinal lymph nodes are present, none of which are enlarged by CT size criteria. Unremarkable appearance of the thoracic esophagus.  Unremarkable appearance of the thoracic inlet and thyroid. Lungs/Pleura: No pneumothorax or pleural effusion. Centrilobular emphysema with minimal paraseptal emphysema at the lung apices. No confluent airspace disease. No significant bronchial wall thickening. No pneumothorax. Musculoskeletal: No displaced fracture. Degenerative changes of the thoracic spine. Review of the MIP images confirms the above findings. CTA ABDOMEN AND PELVIS FINDINGS VASCULAR Aorta: Mixed calcified and soft plaque of the abdominal aorta. No aneurysm. No periaortic fluid. No dissection. Celiac: Typical branch pattern of the celiac artery, with left gastric, common hepatic, and splenic artery identified. SMA: No significant atherosclerotic changes of the superior mesenteric artery. Renals: Renal arteries are patent. Single left and single right renal artery. IMA: Inferior mesenteric artery is patent. Left colic artery patent. Superior rectal artery patent. Right lower extremity: Unremarkable course, caliber, and contour of the right iliac system. No aneurysm, dissection, or occlusion. Hypogastric artery is patent. Anterior and posterior division patent. Common femoral artery patent. Proximal SFA and profunda femoris patent. Left lower extremity: Unremarkable course, caliber, and contour of the left iliac system. No aneurysm, dissection, or occlusion. Hypogastric artery is patent. Anterior and posterior division patent. Common femoral artery patent. Proximal SFA and profunda femoris patent. Veins: Splenic vein appears attenuated posterior to the body of the pancreas, though not well evaluated secondary to the phase of the contrast. Portal vein not well evaluated by the phase of the contrast bolus. Review of the MIP images confirms the above findings. NON-VASCULAR Hepatobiliary: Unremarkable appearance of liver. The gallbladder demonstrates lumen full of non radiopaque stones. Pericholecystic fluid is present with questionable gallbladder wall  thickening. No intrahepatic or extrahepatic biliary ductal dilatation. Pancreas: No inflammatory changes at the head of the pancreas. Body the pancreas relatively unremarkable. Coarse calcification the tail of the pancreas. Focal fluid at the tail the pancreas extending towards the inferior pole of the spleen and adjacent to the splenic flexure, within the splenic colic ligament. No pancreatic ductal dilation. Spleen: Fluid and inflammatory changes adjacent to the inferior tip of the spleen. Adrenals/Urinary Tract: Unremarkable appearance of adrenal glands. No evidence of right-sided or left-sided hydronephrosis. Likely nonobstructive nephrolithiasis bilateral kidneys. Course the bilateral ureters unremarkable. Unremarkable appearance of the urinary bladder. Stomach/Bowel: Inflammatory changes adjacent to the splenic flexure of the colon. Circumferential wall thickening of the distal transverse colon and sigmoid colon. Small fat focus on the lateral sigmoid colon (image 170 series 6). Extensive colonic diverticula of the sigmoid colon, descending colon,  and the distal transverse colon. Unremarkable stomach. Unremarkable small bowel with no abnormal distention. Normal appendix. Lymphatic: Lymph nodes within the preaortic and para-aortic nodal station. Mesenteric: Inflammatory changes within the left upper quadrant adjacent to the spleen. Inflammatory changes of the fat adjacent the gallbladder extending into the hilum of the liver surrounding portal venous structures. No free fluid. Reproductive: Unremarkable appearance of the pelvic organs. Other: No hernia. Musculoskeletal: No acute fracture identified. Multilevel degenerative changes of the lower lumbar spine worst at the L2-L3 level and L5-S1 level. Bilateral facet disease worst in the lower lumbar spine. Degenerative changes bilateral hips. Review of the MIP images confirms the above findings. IMPRESSION: Study is negative for findings of acute aortic syndrome.  Significant inflammatory changes within the left upper quadrant at the tail the pancreas and splenic flexure of the colon. There is apparent circumferential colon wall thickening, and small volume of free fluid. Differential diagnosis includes distal pancreatitis, diverticulitis/epiploic appendagitis, focal ischemia, or potentially sequela of portal hypertension/sinistral portal hypertension. Considered much less likely would be occult colon cancer with perforation. Cholelithiasis with circumferential fluid/gallbladder wall thickening. If there is concern for acute cholecystitis, correlation with lab values and potentially HIDA scan may be considered. Inflammatory changes adjacent to the gallbladder extend towards the liver hilum surrounding portal venous structures, and there is small caliber splenic vein posterior to the pancreas. Portal vein not well evaluated given the absence of a delayed CT phase. If there is concern for portal vein abnormality/thrombus, repeat contrast-enhanced CT with 8 delayed portal venous phase may be considered. The above results were called by telephone at the time of interpretation on 10/01/2016 at 9:30 pm to Dr. Ovid Curd Latanga Nedrow , who verbally acknowledged these results. Aortic atherosclerosis without aneurysm. Coarse calcification the tail of the pancreas, potentially representing chronic pancreatitis/prior pancreatitis. Centrilobular emphysema with minimal paraseptal emphysema. Coronary artery disease. Signed, Dulcy Fanny. Earleen Newport, DO [facility] Radiology Electronically Signed   By: Corrie Mckusick D.O.   On: 10/01/2016 21:34    Procedures Procedures (including critical care time) EMERGENCY DEPARTMENT ULTRASOUND  Study: Limited Retroperitoneal Ultrasound of the Abdominal Aorta.  INDICATIONS:Hypotension Multiple views of the abdominal aorta were obtained in real-time from the diaphragmatic hiatus to the aortic bifurcation in transverse  planes with a multi-frequency probe. PERFORMED BY: Myself and my Attending, Dr. Darl Householder IMAGES ARCHIVED?: Yes FINDINGS: Maximum aortic dimensions are 2.8cm LIMITATIONS:  Body habitus and Bowel gas INTERPRETATION:  No abdominal aortic aneurysm     Medications Ordered in ED Medications  0.9 %  sodium chloride infusion (not administered)  sodium chloride 0.9 % bolus 1,000 mL (1,000 mLs Intravenous New Bag/Given 10/01/16 1849)  iopamidol (ISOVUE-370) 76 % injection (100 mLs  Contrast Given 10/01/16 2020)     Initial Impression / Assessment and Plan / ED Course  I have reviewed the triage vital signs and the nursing notes.  Pertinent labs & imaging results that were available during my care of the patient were reviewed by me and considered in my medical decision making (see chart for details).    66 year old male presenting with a near syncopal episode and intermittent dark stools for several months. Patient hypotensive to 90/40 in triage with a heart rate of 82. Satting well on room air. House and the patient had a systolic in the low [id number]. He is very pale all over. Not currently diaphoretic and denies any pain, chest pain, shortness of breath, abdominal pain, nausea, vomiting. Rectal exam with brown stool. Bedside ultrasound  for AAA negative however limited due to body habitus and bowel gas. Fluid bolus given. CT chest and pelvis dissection study ordered to rule out ruptured AAA.  Labs for a hemoglobin of 4.1 and hematocrit of 13.9. Type and screen ordered and 3 units of packed red blood cells ordered. Protonix Given.  CT with negative findings of acute aortic syndrome however several findings found significant for inflammatory changes in the left upper quadrant at the patella pancreas and splenic flexure of the colon as well as around the gallbladder that I spoke about with the radiologist. LFTs and lipase added. LFTs and lipase unremarkable and he has no abdominal tenderness to  palpation. Patient will be admitted to stepdown unit with tried hospitalist for continued transfusion and monitoring of hemoglobin as well as further workup for concern of upper GI bleed.  Patient care discussed and supervised by my attending, Dr. Darl Householder. Drucie Ip, MD   Final Clinical Impressions(s) / ED Diagnoses   Final diagnoses:  Weakness  Melena    New Prescriptions New Prescriptions   No medications on file     Jaeley Wiker [place] Fredy Gladu, MD 10/01/16 Sharon Yao, MD 10/02/16 [place] Yao, MD 10/02/16 1113

## 2016-10-01 NOTE — H&P (Signed)
History and Physical    Barry Mccoy DOB: Jun 10, 1951 DOA: 10/01/2016  PCP: No PCP Per Patient  Patient coming from: Home.  Chief Complaint: Almost passed out.  HPI: Barry Mccoy is a 66 y.o. male with history of low back pain was brought to the ER the patient almost passed out during shopping. Patient states over the last 1 month patient has been having recurrent episodes of near syncope. Patient also gets exertional shortness of breath. Initially the patient was hypotensive in the ER which improved with fluid bolus. Hemoglobin was 4.1 with microcytic hypochromic picture. Patient states he takes NSAIDs off and on for pain related issues. And also has noticed black stools. Stool for blood in the ER was done which showed brown stools. At this time patient is being admitted for symptomatic severe anemia possibly from GI bleed. Patient states he has had a colonoscopy 8 months ago at [facility] and was advised to get it done every 2-3 years because of polyp.  Since patient also had mild chest discomfort on presentation with hypotension ER physician and ordered CT and exam of the chest abdomen and pelvis which shows inflammatory stranding around the upper quadrants. Patient states that over the last 1 week he has been having some left upper quadrant pain.   ED Course: IV fluid bolus was given followed by 2 units of PRBC ordered. EKG shows normal sinus rhythm. CT angiogram of the chest abdomen and pelvis was done.  Review of Systems: As per HPI, rest all negative.   Past Medical History:  Diagnosis Date  . Muscle spasm     Past Surgical History:  Procedure Laterality Date  . ELBOW SURGERY       reports that he has been smoking Cigarettes.  He has been smoking about 1.00 pack per day. He has never used smokeless tobacco. He reports that he does not drink alcohol or use drugs.  No Known Allergies  Family History  Problem Relation Age of Onset  . Colon  cancer Brother     Prior to Admission medications   Medication Sig Start Date End Date Taking? Authorizing Provider  baclofen (LIORESAL) 20 MG tablet Take 40 mg by mouth at bedtime.   Yes Historical Provider, MD  Cholecalciferol (VITAMIN D-3) 5000 units TABS Take 5,000 Units by mouth at bedtime.   Yes Historical Provider, MD  levETIRAcetam (KEPPRA) 500 MG tablet Take 1,500 mg by mouth at bedtime. For migraine prevention   Yes Historical Provider, MD  lidocaine (LIDODERM) 5 % Place 2 patches onto the skin daily as needed (pain). Remove & Discard patch within 12 hours or as directed by MD   Yes Historical Provider, MD  Multiple Vitamin (MULTIVITAMIN WITH MINERALS) TABS tablet Take 1 tablet by mouth at bedtime.   Yes Historical Provider, MD  OnabotulinumtoxinA (BOTOX IJ) Inject as directed See admin instructions. Botox injections done at Dr. Audery Amel office approximately every 90 days   Yes Historical Provider, MD  sertraline (ZOLOFT) 50 MG tablet Take 50 mg by mouth at bedtime.   Yes Historical Provider, MD  tamsulosin (FLOMAX) 0.4 MG CAPS capsule Take 0.4 mg by mouth at bedtime.   Yes Historical Provider, MD  traZODone (DESYREL) 100 MG tablet Take 200 mg by mouth at bedtime.   Yes Historical Provider, MD    Physical Exam: Vitals:   10/01/16 1820 10/01/16 1822 10/01/16 2247 10/01/16 2308  BP:   (!) 110/52 106/71  Pulse:   (!) 135 Marland Kitchen)  131  Resp:   20 20  Temp: 98.2 F (36.8 C)  99 F (37.2 C) 98.3 F (36.8 C)  TempSrc: Oral  Oral Oral  SpO2:   100% 100%  Weight:  63.5 kg (140 lb)    Height:  6\' 2"  (1.88 m)        Constitutional: Moderately built and nourished. Vitals:   10/01/16 1820 10/01/16 1822 10/01/16 2247 10/01/16 2308  BP:   (!) 110/52 106/71  Pulse:   (!) 135 (!) 131  Resp:   20 20  Temp: 98.2 F (36.8 C)  99 F (37.2 C) 98.3 F (36.8 C)  TempSrc: Oral  Oral Oral  SpO2:   100% 100%  Weight:  63.5 kg (140 lb)    Height:  6\' 2"  (1.88 m)     Eyes: Anicteric no  pallor. ENMT: No discharge from the ears eyes nose and mouth. Neck: No neck rigidity no mass felt. Respiratory: No rhonchi or crepitations. Cardiovascular: S1-S2 heard no murmurs appreciated. Abdomen: Soft nontender bowel sounds present. Musculoskeletal: No edema no joint effusion. Skin: No rash. Skin appears warm. Neurologic: Alert awake oriented to time place and person. Moves all extremities. Psychiatric: Appears normal. Normal affect.   Labs on Admission: I have personally reviewed following labs and imaging studies  CBC:  Recent Labs Lab 10/01/16 2000  WBC 10.3  HGB 4.1*  HCT 13.9*  MCV 76.0*  PLT [id number]   Basic Metabolic Panel:  Recent Labs Lab 10/01/16 1846  NA 134*  K 3.9  CL 102  CO2 23  GLUCOSE 111*  BUN 16  CREATININE 1.21  CALCIUM 8.6*   GFR: Estimated Creatinine Clearance: 53.9 mL/min (by C-G formula based on SCr of 1.21 mg/dL). Liver Function Tests:  Recent Labs Lab 10/01/16 2112  AST 18  ALT 14*  ALKPHOS 62  BILITOT 0.4  PROT 5.9*  ALBUMIN 3.0*    Recent Labs Lab 10/01/16 2112  LIPASE 40   No results for input(s): AMMONIA in the last 168 hours. Coagulation Profile:  Recent Labs Lab 10/01/16 2000  INR 1.26   Cardiac Enzymes: No results for input(s): CKTOTAL, CKMB, CKMBINDEX, TROPONINI in the last 168 hours. BNP (last 3 results) No results for input(s): PROBNP in the last 8760 hours. HbA1C: No results for input(s): HGBA1C in the last 72 hours. CBG: No results for input(s): GLUCAP in the last 168 hours. Lipid Profile: No results for input(s): CHOL, HDL, LDLCALC, TRIG, CHOLHDL, LDLDIRECT in the last 72 hours. Thyroid Function Tests: No results for input(s): TSH, T4TOTAL, FREET4, T3FREE, THYROIDAB in the last 72 hours. Anemia Panel: No results for input(s): VITAMINB12, FOLATE, FERRITIN, TIBC, IRON, RETICCTPCT in the last 72 hours. Urine analysis: No results found for: COLORURINE, APPEARANCEUR, LABSPEC, PHURINE, GLUCOSEU, HGBUR,  BILIRUBINUR, KETONESUR, PROTEINUR, UROBILINOGEN, NITRITE, LEUKOCYTESUR Sepsis Labs: @LABRCNTIP (procalcitonin:4,lacticidven:4) )No results found for this or any previous visit (from the past 240 hour(s)).   Radiological Exams on Admission: Ct Angio Chest/abd/pel For Dissection W And/or Wo Contrast  Result Date: 10/01/2016 CLINICAL DATA:  66 year old male with left-sided flank pain, concerning for dissection. EXAM: CT ANGIOGRAPHY CHEST, ABDOMEN AND PELVIS TECHNIQUE: Multidetector CT imaging through the chest, abdomen and pelvis was performed using the standard protocol during bolus administration of intravenous contrast. Multiplanar reconstructed images including MIPs were obtained and reviewed to evaluate the vascular anatomy. CONTRAST:  100 cc Isovue 370 COMPARISON:  None. FINDINGS: CTA CHEST FINDINGS Cardiovascular: Heart: No cardiomegaly. No pericardial fluid/thickening. Calcifications of the left main, left anterior  descending, circumflex coronary arteries. Aorta: Unremarkable course, caliber, contour of the thoracic aorta. No aneurysm or dissection flap. No periaortic fluid. Soft plaque of the aortic arch with minimal calcifications. Mixed atherosclerotic and soft plaque of the descending thoracic aorta. Pulmonary arteries: No central, lobar, segmental, or proximal subsegmental filling defects. Mediastinum/Nodes: Mediastinal lymph nodes are present, none of which are enlarged by CT size criteria. Unremarkable appearance of the thoracic esophagus. Unremarkable appearance of the thoracic inlet and thyroid. Lungs/Pleura: No pneumothorax or pleural effusion. Centrilobular emphysema with minimal paraseptal emphysema at the lung apices. No confluent airspace disease. No significant bronchial wall thickening. No pneumothorax. Musculoskeletal: No displaced fracture. Degenerative changes of the thoracic spine. Review of the MIP images confirms the above findings. CTA ABDOMEN AND PELVIS FINDINGS VASCULAR Aorta:  Mixed calcified and soft plaque of the abdominal aorta. No aneurysm. No periaortic fluid. No dissection. Celiac: Typical branch pattern of the celiac artery, with left gastric, common hepatic, and splenic artery identified. SMA: No significant atherosclerotic changes of the superior mesenteric artery. Renals: Renal arteries are patent. Single left and single right renal artery. IMA: Inferior mesenteric artery is patent. Left colic artery patent. Superior rectal artery patent. Right lower extremity: Unremarkable course, caliber, and contour of the right iliac system. No aneurysm, dissection, or occlusion. Hypogastric artery is patent. Anterior and posterior division patent. Common femoral artery patent. Proximal SFA and profunda femoris patent. Left lower extremity: Unremarkable course, caliber, and contour of the left iliac system. No aneurysm, dissection, or occlusion. Hypogastric artery is patent. Anterior and posterior division patent. Common femoral artery patent. Proximal SFA and profunda femoris patent. Veins: Splenic vein appears attenuated posterior to the body of the pancreas, though not well evaluated secondary to the phase of the contrast. Portal vein not well evaluated by the phase of the contrast bolus. Review of the MIP images confirms the above findings. NON-VASCULAR Hepatobiliary: Unremarkable appearance of liver. The gallbladder demonstrates lumen full of non radiopaque stones. Pericholecystic fluid is present with questionable gallbladder wall thickening. No intrahepatic or extrahepatic biliary ductal dilatation. Pancreas: No inflammatory changes at the head of the pancreas. Body the pancreas relatively unremarkable. Coarse calcification the tail of the pancreas. Focal fluid at the tail the pancreas extending towards the inferior pole of the spleen and adjacent to the splenic flexure, within the splenic colic ligament. No pancreatic ductal dilation. Spleen: Fluid and inflammatory changes adjacent  to the inferior tip of the spleen. Adrenals/Urinary Tract: Unremarkable appearance of adrenal glands. No evidence of right-sided or left-sided hydronephrosis. Likely nonobstructive nephrolithiasis bilateral kidneys. Course the bilateral ureters unremarkable. Unremarkable appearance of the urinary bladder. Stomach/Bowel: Inflammatory changes adjacent to the splenic flexure of the colon. Circumferential wall thickening of the distal transverse colon and sigmoid colon. Small fat focus on the lateral sigmoid colon (image 170 series 6). Extensive colonic diverticula of the sigmoid colon, descending colon, and the distal transverse colon. Unremarkable stomach. Unremarkable small bowel with no abnormal distention. Normal appendix. Lymphatic: Lymph nodes within the preaortic and para-aortic nodal station. Mesenteric: Inflammatory changes within the left upper quadrant adjacent to the spleen. Inflammatory changes of the fat adjacent the gallbladder extending into the hilum of the liver surrounding portal venous structures. No free fluid. Reproductive: Unremarkable appearance of the pelvic organs. Other: No hernia. Musculoskeletal: No acute fracture identified. Multilevel degenerative changes of the lower lumbar spine worst at the L2-L3 level and L5-S1 level. Bilateral facet disease worst in the lower lumbar spine. Degenerative changes bilateral hips. Review of the MIP images  confirms the above findings. IMPRESSION: Study is negative for findings of acute aortic syndrome. Significant inflammatory changes within the left upper quadrant at the tail the pancreas and splenic flexure of the colon. There is apparent circumferential colon wall thickening, and small volume of free fluid. Differential diagnosis includes distal pancreatitis, diverticulitis/epiploic appendagitis, focal ischemia, or potentially sequela of portal hypertension/sinistral portal hypertension. Considered much less likely would be occult colon cancer with  perforation. Cholelithiasis with circumferential fluid/gallbladder wall thickening. If there is concern for acute cholecystitis, correlation with lab values and potentially HIDA scan may be considered. Inflammatory changes adjacent to the gallbladder extend towards the liver hilum surrounding portal venous structures, and there is small caliber splenic vein posterior to the pancreas. Portal vein not well evaluated given the absence of a delayed CT phase. If there is concern for portal vein abnormality/thrombus, repeat contrast-enhanced CT with 8 delayed portal venous phase may be considered. The above results were called by telephone at the time of interpretation on 10/01/2016 at 9:30 pm to Dr. Ovid Curd PAGE , who verbally acknowledged these results. Aortic atherosclerosis without aneurysm. Coarse calcification the tail of the pancreas, potentially representing chronic pancreatitis/prior pancreatitis. Centrilobular emphysema with minimal paraseptal emphysema. Coronary artery disease. Signed, Dulcy Fanny. Earleen Newport, DO [facility] Electronically Signed   By: Corrie Mckusick D.O.   On: 10/01/2016 21:34    EKG: Independently reviewed. Normal sinus rhythm.  Assessment/Plan Principal Problem:   Acute GI bleeding Active Problems:   Symptomatic anemia    1. Acute GI bleeding - given the history of black stools and use of NSAIDs probably patient has upper GI bleed. Patient has been placed on Protonix infusion and 3 units of packed red blood cell transfusion has been ordered. Patient will be kept nothing by mouth in anticipation of GI procedure. Will need to consult gastroenterologist. Patient states he has had a colonoscopy 8 months ago in [facility]. Patient was told to repeat colonoscopy in 2-3 years. 2. Abnormal CT of the abdomen - discussed with Dr. Alvino Blood, on call general surgeon. At this time per Dr. Alvino Blood, there was no signs of perforation. Likely  patient has colitis as per the general surgeon. I have placed patient on IV antibiotics for now. 3. Gallbladder stones - further workup as outpatient. 4. Chronic back pain - for now I have dosed Keppra through IV. 5. Blood loss anemia - follow CBC after transfusion.  Since patient also had initially had some chest pain probably due to symptomatic anemia will check troponin.   DVT prophylaxis: SCDs. Code Status: Full code.  Family Communication: Discussed with patient.  Disposition Plan: Home.  Consults called: Discussed with general surgery.  Admission status: Inpatient.    Rise Patience MD [facility] Pager [phone].  If 7PM-7AM, please contact night-coverage [email or web address] Password [facility]  10/01/2016, 11:36 PM

## 2016-10-02 ENCOUNTER — Inpatient Hospital Stay (HOSPITAL_COMMUNITY): Payer: Medicare Other

## 2016-10-02 DIAGNOSIS — J9600 Acute respiratory failure, unspecified whether with hypoxia or hypercapnia: Secondary | ICD-10-CM

## 2016-10-02 DIAGNOSIS — K922 Gastrointestinal hemorrhage, unspecified: Secondary | ICD-10-CM

## 2016-10-02 DIAGNOSIS — R410 Disorientation, unspecified: Secondary | ICD-10-CM

## 2016-10-02 DIAGNOSIS — F10231 Alcohol dependence with withdrawal delirium: Secondary | ICD-10-CM

## 2016-10-02 DIAGNOSIS — D649 Anemia, unspecified: Secondary | ICD-10-CM

## 2016-10-02 DIAGNOSIS — J9601 Acute respiratory failure with hypoxia: Secondary | ICD-10-CM

## 2016-10-02 DIAGNOSIS — G9340 Encephalopathy, unspecified: Secondary | ICD-10-CM

## 2016-10-02 LAB — COMPREHENSIVE METABOLIC PANEL
ALBUMIN: 3 g/dL — AB (ref 3.5–5.0)
ALT: 20 U/L (ref 17–63)
AST: 33 U/L (ref 15–41)
Alkaline Phosphatase: 70 U/L (ref 38–126)
Anion gap: 13 (ref 5–15)
BILIRUBIN TOTAL: 2.6 mg/dL — AB (ref 0.3–1.2)
BUN: 19 mg/dL (ref 6–20)
CHLORIDE: 106 mmol/L (ref 101–111)
CO2: 18 mmol/L — AB (ref 22–32)
Calcium: 8.4 mg/dL — ABNORMAL LOW (ref 8.9–10.3)
Creatinine, Ser: 1.41 mg/dL — ABNORMAL HIGH (ref 0.61–1.24)
GFR calc Af Amer: 58 mL/min — ABNORMAL LOW (ref >60)
GFR calc non Af Amer: 50 mL/min — ABNORMAL LOW (ref >60)
GLUCOSE: 138 mg/dL — AB (ref 65–99)
POTASSIUM: 4 mmol/L (ref 3.5–5.1)
SODIUM: 137 mmol/L (ref 135–145)
Total Protein: 6.1 g/dL — ABNORMAL LOW (ref 6.5–8.1)

## 2016-10-02 LAB — BASIC METABOLIC PANEL
ANION GAP: 9 (ref 5–15)
BUN: 21 mg/dL — ABNORMAL HIGH (ref 6–20)
CHLORIDE: 108 mmol/L (ref 101–111)
CO2: 20 mmol/L — AB (ref 22–32)
CREATININE: 1.19 mg/dL (ref 0.61–1.24)
Calcium: 8.3 mg/dL — ABNORMAL LOW (ref 8.9–10.3)
GFR calc non Af Amer: >60 mL/min (ref >60)
Glucose, Bld: 89 mg/dL (ref 65–99)
Potassium: 4 mmol/L (ref 3.5–5.1)
SODIUM: 137 mmol/L (ref 135–145)

## 2016-10-02 LAB — GLUCOSE, CAPILLARY
GLUCOSE-CAPILLARY: 101 mg/dL — AB (ref 65–99)
Glucose-Capillary: 108 mg/dL — ABNORMAL HIGH (ref 65–99)

## 2016-10-02 LAB — URINALYSIS, ROUTINE W REFLEX MICROSCOPIC
Bilirubin Urine: NEGATIVE
Glucose, UA: NEGATIVE mg/dL
Hgb urine dipstick: NEGATIVE
Ketones, ur: NEGATIVE mg/dL
LEUKOCYTES UA: NEGATIVE
NITRITE: NEGATIVE
PH: 5 (ref 5.0–8.0)
Protein, ur: NEGATIVE mg/dL

## 2016-10-02 LAB — MRSA PCR SCREENING
MRSA by PCR: NEGATIVE
MRSA by PCR: NEGATIVE

## 2016-10-02 LAB — CK TOTAL AND CKMB (NOT AT ARMC)
CK TOTAL: 1886 U/L — AB (ref 49–397)
CK, MB: 10.3 ng/mL — AB (ref 0.5–5.0)
RELATIVE INDEX: 0.5 (ref 0.0–2.5)

## 2016-10-02 LAB — CBC
HCT: 19.7 % — ABNORMAL LOW (ref 39.0–52.0)
Hemoglobin: 6.1 g/dL — CL (ref 13.0–17.0)
MCH: 24.7 pg — ABNORMAL LOW (ref 26.0–34.0)
MCHC: 31 g/dL (ref 30.0–36.0)
MCV: 79.8 fL (ref 78.0–100.0)
PLATELETS: 296 10*3/uL (ref 150–400)
RBC: 2.47 MIL/uL — ABNORMAL LOW (ref 4.22–5.81)
RDW: 19 % — AB (ref 11.5–15.5)
WBC: 11.6 10*3/uL — AB (ref 4.0–10.5)

## 2016-10-02 LAB — PREPARE RBC (CROSSMATCH)

## 2016-10-02 LAB — POCT I-STAT 3, ART BLOOD GAS (G3+)
Acid-base deficit: 2 mmol/L (ref 0.0–2.0)
BICARBONATE: 22.7 mmol/L (ref 20.0–28.0)
O2 SAT: 100 %
Patient temperature: 99.2
TCO2: 24 mmol/L (ref 0–100)
pCO2 arterial: 40.3 mmHg (ref 32.0–48.0)
pH, Arterial: 7.361 (ref 7.350–7.450)
pO2, Arterial: 180 mmHg — ABNORMAL HIGH (ref 83.0–108.0)

## 2016-10-02 LAB — CBG MONITORING, ED: Glucose-Capillary: 97 mg/dL (ref 65–99)

## 2016-10-02 LAB — ETHANOL: Alcohol, Ethyl (B): <5 mg/dL (ref <5)

## 2016-10-02 LAB — AMMONIA: AMMONIA: 18 umol/L (ref 9–35)

## 2016-10-02 LAB — TROPONIN I
Troponin I: 0.04 ng/mL (ref <0.03)
Troponin I: 0.05 ng/mL (ref <0.03)

## 2016-10-02 LAB — ABO/RH: ABO/RH(D): A POS

## 2016-10-02 LAB — TSH: TSH: 1.094 u[IU]/mL (ref 0.350–4.500)

## 2016-10-02 MED ORDER — PIPERACILLIN-TAZOBACTAM 3.375 G IVPB
3.3750 g | Freq: Three times a day (TID) | INTRAVENOUS | Status: DC
  Filled 2016-10-02 (×3): qty 50

## 2016-10-02 MED ORDER — CHLORHEXIDINE GLUCONATE 0.12% ORAL RINSE (MEDLINE KIT)
15.0000 mL | Freq: Two times a day (BID) | OROMUCOSAL | Status: DC
  Administered 2016-10-03 – 2016-10-09 (×12): 15 mL via OROMUCOSAL

## 2016-10-02 MED ORDER — DIAZEPAM 5 MG/ML IJ SOLN
2.5000 mg | INTRAMUSCULAR | Status: DC | PRN
  Administered 2016-10-02: 2.5 mg via INTRAVENOUS

## 2016-10-02 MED ORDER — ROCURONIUM BROMIDE 50 MG/5ML IV SOLN
50.0000 mg | Freq: Once | INTRAVENOUS | Status: AC
  Administered 2016-10-02: 50 mg via INTRAVENOUS

## 2016-10-02 MED ORDER — FENTANYL BOLUS VIA INFUSION
25.0000 ug | INTRAVENOUS | Status: DC | PRN
  Administered 2016-10-04 – 2016-10-07 (×5): 25 ug via INTRAVENOUS
  Filled 2016-10-02: qty 25

## 2016-10-02 MED ORDER — ETOMIDATE 2 MG/ML IV SOLN
20.0000 mg | Freq: Once | INTRAVENOUS | Status: AC
  Administered 2016-10-02: 20 mg via INTRAVENOUS

## 2016-10-02 MED ORDER — DIAZEPAM 5 MG/ML IJ SOLN: 5.0000 mg | INTRAMUSCULAR | Status: DC | PRN

## 2016-10-02 MED ORDER — MIDAZOLAM HCL 2 MG/2ML IJ SOLN
2.0000 mg | Freq: Once | INTRAMUSCULAR | Status: AC
  Administered 2016-10-02: 2 mg via INTRAVENOUS

## 2016-10-02 MED ORDER — LORAZEPAM 2 MG/ML IJ SOLN
2.0000 mg | INTRAMUSCULAR | Status: DC | PRN
  Administered 2016-10-02: 2 mg via INTRAVENOUS
  Administered 2016-10-02: 3 mg via INTRAVENOUS
  Filled 2016-10-02: qty 2
  Filled 2016-10-02: qty 1

## 2016-10-02 MED ORDER — LORAZEPAM 2 MG/ML IJ SOLN
2.0000 mg | INTRAMUSCULAR | Status: DC
  Administered 2016-10-02: 2 mg via INTRAVENOUS

## 2016-10-02 MED ORDER — LORAZEPAM 2 MG/ML IJ SOLN: 4.0000 mg | INTRAMUSCULAR | Status: DC | PRN

## 2016-10-02 MED ORDER — SODIUM CHLORIDE 0.9 % IV SOLN
25.0000 ug/h | INTRAVENOUS | Status: DC
  Administered 2016-10-02: 100 ug/h via INTRAVENOUS
  Administered 2016-10-03 (×2): 400 ug/h via INTRAVENOUS
  Administered 2016-10-03: 175 ug/h via INTRAVENOUS
  Administered 2016-10-04 – 2016-10-05 (×7): 400 ug/h via INTRAVENOUS
  Administered 2016-10-06: 250 ug/h via INTRAVENOUS
  Administered 2016-10-06 – 2016-10-07 (×2): 300 ug/h via INTRAVENOUS
  Filled 2016-10-02 (×18): qty 50

## 2016-10-02 MED ORDER — LORAZEPAM 2 MG/ML IJ SOLN
INTRAMUSCULAR | Status: AC
  Administered 2016-10-02: 2 mg
  Filled 2016-10-02: qty 1

## 2016-10-02 MED ORDER — DIAZEPAM 5 MG/ML IJ SOLN
2.5000 mg | INTRAMUSCULAR | Status: DC | PRN
  Filled 2016-10-02: qty 2

## 2016-10-02 MED ORDER — LORAZEPAM 2 MG/ML IJ SOLN
1.0000 mg | Freq: Once | INTRAMUSCULAR | Status: AC
  Administered 2016-10-02: 1 mg via INTRAVENOUS

## 2016-10-02 MED ORDER — MIDAZOLAM HCL 2 MG/2ML IJ SOLN
1.0000 mg | INTRAMUSCULAR | Status: DC | PRN
  Administered 2016-10-06 – 2016-10-08 (×4): 1 mg via INTRAVENOUS
  Filled 2016-10-02: qty 2

## 2016-10-02 MED ORDER — FENTANYL CITRATE (PF) 100 MCG/2ML IJ SOLN: 50.0000 ug | Freq: Once | INTRAMUSCULAR | Status: DC

## 2016-10-02 MED ORDER — NICOTINE 21 MG/24HR TD PT24
21.0000 mg | MEDICATED_PATCH | Freq: Every day | TRANSDERMAL | Status: DC
  Administered 2016-10-02 – 2016-10-08 (×8): 21 mg via TRANSDERMAL
  Filled 2016-10-02 (×10): qty 1

## 2016-10-02 MED ORDER — HALOPERIDOL LACTATE 5 MG/ML IJ SOLN
2.0000 mg | Freq: Once | INTRAMUSCULAR | Status: AC
  Administered 2016-10-02: 2 mg via INTRAMUSCULAR
  Filled 2016-10-02: qty 1

## 2016-10-02 MED ORDER — LORAZEPAM 2 MG/ML IJ SOLN
INTRAMUSCULAR | Status: AC
  Administered 2016-10-02: 2 mg via INTRAMUSCULAR
  Filled 2016-10-02: qty 1

## 2016-10-02 MED ORDER — PIPERACILLIN-TAZOBACTAM 3.375 G IVPB
3.3750 g | Freq: Three times a day (TID) | INTRAVENOUS | Status: AC
  Administered 2016-10-02 – 2016-10-09 (×21): 3.375 g via INTRAVENOUS
  Filled 2016-10-02 (×21): qty 50

## 2016-10-02 MED ORDER — THIAMINE HCL 100 MG/ML IJ SOLN
100.0000 mg | Freq: Every day | INTRAMUSCULAR | Status: DC
  Administered 2016-10-03 – 2016-10-09 (×7): 100 mg via INTRAVENOUS
  Filled 2016-10-02 (×8): qty 2

## 2016-10-02 MED ORDER — ORAL CARE MOUTH RINSE
15.0000 mL | Freq: Four times a day (QID) | OROMUCOSAL | Status: DC
  Administered 2016-10-03 – 2016-10-09 (×26): 15 mL via OROMUCOSAL

## 2016-10-02 MED ORDER — LORAZEPAM 2 MG/ML IJ SOLN
2.0000 mg | Freq: Once | INTRAMUSCULAR | Status: AC
Start: 2016-10-02 — End: 2016-10-02
  Administered 2016-10-02: 2 mg via INTRAMUSCULAR

## 2016-10-02 MED ORDER — DEXMEDETOMIDINE HCL IN NACL 400 MCG/100ML IV SOLN
0.0000 ug/kg/h | INTRAVENOUS | Status: DC
  Administered 2016-10-02: 1.1 ug/kg/h via INTRAVENOUS
  Administered 2016-10-02: 0.4 ug/kg/h via INTRAVENOUS
  Administered 2016-10-02 (×2): 1.2 ug/kg/h via INTRAVENOUS
  Administered 2016-10-03: 1.1 ug/kg/h via INTRAVENOUS
  Administered 2016-10-03: 1 ug/kg/h via INTRAVENOUS
  Administered 2016-10-03: 1.2 ug/kg/h via INTRAVENOUS
  Administered 2016-10-03: 0.5 ug/kg/h via INTRAVENOUS
  Administered 2016-10-03: 1.2 ug/kg/h via INTRAVENOUS
  Filled 2016-10-02: qty 50
  Filled 2016-10-02: qty 100
  Filled 2016-10-02: qty 50
  Filled 2016-10-02: qty 100
  Filled 2016-10-02 (×2): qty 50
  Filled 2016-10-02: qty 100
  Filled 2016-10-02: qty 50
  Filled 2016-10-02 (×2): qty 100

## 2016-10-02 MED ORDER — LORAZEPAM 2 MG/ML IJ SOLN
INTRAMUSCULAR | Status: AC
  Administered 2016-10-02: 09:00:00 via INTRAMUSCULAR
  Filled 2016-10-02: qty 1

## 2016-10-02 MED ORDER — SODIUM CHLORIDE 0.9 % IV SOLN
Freq: Once | INTRAVENOUS | Status: AC
  Administered 2016-10-02: 21:00:00 via INTRAVENOUS

## 2016-10-02 MED ORDER — MIDAZOLAM HCL 2 MG/2ML IJ SOLN
1.0000 mg | INTRAMUSCULAR | Status: DC | PRN
  Administered 2016-10-03 (×2): 1 mg via INTRAVENOUS
  Filled 2016-10-02 (×2): qty 2

## 2016-10-02 MED ORDER — FENTANYL CITRATE (PF) 100 MCG/2ML IJ SOLN
100.0000 ug | Freq: Once | INTRAMUSCULAR | Status: AC
  Administered 2016-10-02: 100 ug via INTRAVENOUS

## 2016-10-02 NOTE — Procedures (Signed)
Intubation Procedure Note Barry Mccoy [id number] 04-Sep-1951  Procedure: Intubation Indications: Respiratory insufficiency  Procedure Details Consent: Unable to obtain consent because of emergent medical necessity. Time Out: Verified patient identification, verified procedure, site/side was marked, verified correct patient position, special equipment/implants available, medications/allergies/relevent history reviewed, required imaging and test results available.  Performed  Maximum sterile technique was used including gloves, hand hygiene and mask.  MAC    Evaluation Hemodynamic Status: BP stable throughout; O2 sats: stable throughout Patient's Current Condition: stable Complications: No apparent complications Patient did tolerate procedure well. Chest X-ray ordered to verify placement.  CXR: pending.   [name] 10/02/2016

## 2016-10-02 NOTE — ED Notes (Signed)
Hospital bed ordered.

## 2016-10-02 NOTE — Progress Notes (Signed)
Around  6:30, RN noticed pt became increasingly fidgety and anxious. NP Schorr notified and 1mg  of ativan was given. Afterwards, pt appeared to have altered mental status and became combative with RN and assistance was called. MD Reesa Chew, and rapid response notified to come to bedside for assessment and 4 pt restraints were applied.

## 2016-10-02 NOTE — ED Notes (Signed)
Pt removed himself from cardiac monitor. States he needs to walk around the room. Pt encouraged to allow staff to reattach monitor leads, pt refused, walking around the treatment room at this time, denies lightheadedness/dizziness.

## 2016-10-02 NOTE — Consult Note (Addendum)
PULMONARY / CRITICAL CARE MEDICINE   Name: Barry Mccoy MRN: [id number] DOB: [birth year]    ADMISSION DATE:  10/01/2016 CONSULTATION DATE:  1/23  REFERRING MD:  Reesa Chew   CHIEF COMPLAINT:  Acute Encephalopathy and intermittent hypoxia   HISTORY OF PRESENT ILLNESS:   This is a 66 year old male who was admitted on 1/23 w/ cc: recurrent episodes of near syncope & exertional shortness of breath. In the ER found to be Hypotensive which responded to IVFs and had a hgb of 4.1. Had been reporting black stools, + NSAIDs.  Has h/o polyps. Admitted for symptomatic and severe anemia. In ER got 3 units PRBCs, IVF bolus's. Was admitted w/ working dx of melena, symptomatic anemia.  - since admit from the ER developed increased agitation to the point he was in 4 point restraints.  - was combative and received PRN ativan.  - as of the afternoon on 1/23: had received 12mg  ativan this then resulted in hypoactive delirium/oversedation w/ sats at times down to the 70 w/ associated episodes of apnea. PCCM was asked to assess.    PAST MEDICAL HISTORY :  He  has a past medical history of Muscle spasm. Also h/o colon polyps  PAST SURGICAL HISTORY: He  has a past surgical history that includes Elbow surgery.  No Known Allergies  No current facility-administered medications on file prior to encounter.    No current outpatient prescriptions on file prior to encounter.    FAMILY HISTORY:  His indicated that the status of his brother is unknown.    SOCIAL HISTORY: He  reports that he has been smoking Cigarettes.  He has been smoking about 1.00 pack per day. He has never used smokeless tobacco. He reports that he does not drink alcohol or use drugs.  REVIEW OF SYSTEMS:   Not able currently   SUBJECTIVE:  Minimally responsive 66 year old male  VITAL SIGNS: BP 130/82   Pulse 93   Temp 98.6 F (37 C) (Axillary)   Resp (!) 26   Ht 6\' 2"  (1.88 m)   Wt 235 lb 3.2 oz (106.7 kg)   SpO2 (!) 89%   BMI 30.20  kg/m   HEMODYNAMICS:    VENTILATOR SETTINGS: FiO2 (%):  [0 %] 0 %  INTAKE / OUTPUT: I/O last 3 completed shifts: In: 1857.4 [I.V.:286.3; Blood:1356.2; IV Piggyback:215] Out: -   PHYSICAL EXAMINATION: General appearance:  66 Year old  Male, well nourished, pale, currently minimally responsive.  Eyes: anicteric, moist conjunctivae; PERRL, EOMI bilaterally. Mouth:  membranes and no mucosal ulcerations; normal hard and soft palate, tongue and mouth is dry  Neck: Trachea midline; neck supple, no JVD Lungs/chest: mild accessory muscle use. Prolonged expiratory wheeze CV: RRR, no MRGs  Abdomen: Soft, non-tender; no masses or HSM Extremities: No peripheral edema or extremity lymphadenopathy Skin: Normal temperature, turgor and texture; no rash, ulcers or subcutaneous nodules, pale as mentioned above  Psych/neuro: currently sedated. Was agitated and non-focal  LABS:  BMET  Recent Labs Lab 10/01/16 1846 10/02/16 0955  NA 134* 137  K 3.9 4.0  CL 102 106  CO2 23 18*  BUN 16 19  CREATININE 1.21 1.41*  GLUCOSE 111* 138*    Electrolytes  Recent Labs Lab 10/01/16 1846 10/02/16 0955  CALCIUM 8.6* 8.4*    CBC  Recent Labs Lab 10/01/16 2000 10/02/16 0955  WBC 10.3 18.4*  HGB 4.1* 6.3*  HCT 13.9* 20.2*  PLT 327 308    Coag's  Recent Labs Lab  10/01/16 2000  INR 1.26    Sepsis Markers  Recent Labs Lab 10/01/16 2234  LATICACIDVEN 2.15*    ABG No results for input(s): PHART, PCO2ART, PO2ART in the last 168 hours.  Liver Enzymes  Recent Labs Lab 10/01/16 2112 10/02/16 0955  AST 18 33  ALT 14* 20  ALKPHOS 62 70  BILITOT 0.4 2.6*  ALBUMIN 3.0* 3.0*    Cardiac Enzymes  Recent Labs Lab 10/02/16 0955  TROPONINI 0.04*    Glucose  Recent Labs Lab 10/02/16 0018  GLUCAP 97    Imaging Ct Angio Chest/abd/pel For Dissection W And/or Wo Contrast  Result Date: 10/01/2016 CLINICAL DATA:  66 year old male with left-sided flank pain, concerning  for dissection. EXAM: CT ANGIOGRAPHY CHEST, ABDOMEN AND PELVIS TECHNIQUE: Multidetector CT imaging through the chest, abdomen and pelvis was performed using the standard protocol during bolus administration of intravenous contrast. Multiplanar reconstructed images including MIPs were obtained and reviewed to evaluate the vascular anatomy. CONTRAST:  100 cc Isovue 370 COMPARISON:  None. FINDINGS: CTA CHEST FINDINGS Cardiovascular: Heart: No cardiomegaly. No pericardial fluid/thickening. Calcifications of the left main, left anterior descending, circumflex coronary arteries. Aorta: Unremarkable course, caliber, contour of the thoracic aorta. No aneurysm or dissection flap. No periaortic fluid. Soft plaque of the aortic arch with minimal calcifications. Mixed atherosclerotic and soft plaque of the descending thoracic aorta. Pulmonary arteries: No central, lobar, segmental, or proximal subsegmental filling defects. Mediastinum/Nodes: Mediastinal lymph nodes are present, none of which are enlarged by CT size criteria. Unremarkable appearance of the thoracic esophagus. Unremarkable appearance of the thoracic inlet and thyroid. Lungs/Pleura: No pneumothorax or pleural effusion. Centrilobular emphysema with minimal paraseptal emphysema at the lung apices. No confluent airspace disease. No significant bronchial wall thickening. No pneumothorax. Musculoskeletal: No displaced fracture. Degenerative changes of the thoracic spine. Review of the MIP images confirms the above findings. CTA ABDOMEN AND PELVIS FINDINGS VASCULAR Aorta: Mixed calcified and soft plaque of the abdominal aorta. No aneurysm. No periaortic fluid. No dissection. Celiac: Typical branch pattern of the celiac artery, with left gastric, common hepatic, and splenic artery identified. SMA: No significant atherosclerotic changes of the superior mesenteric artery. Renals: Renal arteries are patent. Single left and single right renal artery. IMA: Inferior mesenteric  artery is patent. Left colic artery patent. Superior rectal artery patent. Right lower extremity: Unremarkable course, caliber, and contour of the right iliac system. No aneurysm, dissection, or occlusion. Hypogastric artery is patent. Anterior and posterior division patent. Common femoral artery patent. Proximal SFA and profunda femoris patent. Left lower extremity: Unremarkable course, caliber, and contour of the left iliac system. No aneurysm, dissection, or occlusion. Hypogastric artery is patent. Anterior and posterior division patent. Common femoral artery patent. Proximal SFA and profunda femoris patent. Veins: Splenic vein appears attenuated posterior to the body of the pancreas, though not well evaluated secondary to the phase of the contrast. Portal vein not well evaluated by the phase of the contrast bolus. Review of the MIP images confirms the above findings. NON-VASCULAR Hepatobiliary: Unremarkable appearance of liver. The gallbladder demonstrates lumen full of non radiopaque stones. Pericholecystic fluid is present with questionable gallbladder wall thickening. No intrahepatic or extrahepatic biliary ductal dilatation. Pancreas: No inflammatory changes at the head of the pancreas. Body the pancreas relatively unremarkable. Coarse calcification the tail of the pancreas. Focal fluid at the tail the pancreas extending towards the inferior pole of the spleen and adjacent to the splenic flexure, within the splenic colic ligament. No pancreatic ductal dilation. Spleen: Fluid and  inflammatory changes adjacent to the inferior tip of the spleen. Adrenals/Urinary Tract: Unremarkable appearance of adrenal glands. No evidence of right-sided or left-sided hydronephrosis. Likely nonobstructive nephrolithiasis bilateral kidneys. Course the bilateral ureters unremarkable. Unremarkable appearance of the urinary bladder. Stomach/Bowel: Inflammatory changes adjacent to the splenic flexure of the colon. Circumferential  wall thickening of the distal transverse colon and sigmoid colon. Small fat focus on the lateral sigmoid colon (image 170 series 6). Extensive colonic diverticula of the sigmoid colon, descending colon, and the distal transverse colon. Unremarkable stomach. Unremarkable small bowel with no abnormal distention. Normal appendix. Lymphatic: Lymph nodes within the preaortic and para-aortic nodal station. Mesenteric: Inflammatory changes within the left upper quadrant adjacent to the spleen. Inflammatory changes of the fat adjacent the gallbladder extending into the hilum of the liver surrounding portal venous structures. No free fluid. Reproductive: Unremarkable appearance of the pelvic organs. Other: No hernia. Musculoskeletal: No acute fracture identified. Multilevel degenerative changes of the lower lumbar spine worst at the L2-L3 level and L5-S1 level. Bilateral facet disease worst in the lower lumbar spine. Degenerative changes bilateral hips. Review of the MIP images confirms the above findings. IMPRESSION: Study is negative for findings of acute aortic syndrome. Significant inflammatory changes within the left upper quadrant at the tail the pancreas and splenic flexure of the colon. There is apparent circumferential colon wall thickening, and small volume of free fluid. Differential diagnosis includes distal pancreatitis, diverticulitis/epiploic appendagitis, focal ischemia, or potentially sequela of portal hypertension/sinistral portal hypertension. Considered much less likely would be occult colon cancer with perforation. Cholelithiasis with circumferential fluid/gallbladder wall thickening. If there is concern for acute cholecystitis, correlation with lab values and potentially HIDA scan may be considered. Inflammatory changes adjacent to the gallbladder extend towards the liver hilum surrounding portal venous structures, and there is small caliber splenic vein posterior to the pancreas. Portal vein not well  evaluated given the absence of a delayed CT phase. If there is concern for portal vein abnormality/thrombus, repeat contrast-enhanced CT with 8 delayed portal venous phase may be considered. The above results were called by telephone at the time of interpretation on 10/01/2016 at 9:30 pm to Dr. Ovid Curd PAGE , who verbally acknowledged these results. Aortic atherosclerosis without aneurysm. Coarse calcification the tail of the pancreas, potentially representing chronic pancreatitis/prior pancreatitis. Centrilobular emphysema with minimal paraseptal emphysema. Coronary artery disease. Signed, Dulcy Fanny. Earleen Newport, DO [facility] Electronically Signed   By: Corrie Mckusick D.O.   On: 10/01/2016 21:34     STUDIES:  EEG 1/23>>> Ammonia 1/23>>> TSH 1/23>> Total CK 1/23>> ETOH 1/23>>> PCT 1/23>>> CT head 1/23>>> CT abd/pelvis 1/23:Significant inflammatory changes within the left upper quadrant at the tail the pancreas and splenic flexure of the colon. There is apparent circumferential colon wall thickening, and small volume of free fluid. Differential diagnosis includes distal pancreatitis, diverticulitis/epiploic appendagitis, focal ischemia, or potentially sequela of portal hypertension/sinistral portal hypertension. Considered much less likely would be occult colon cancer with perforation. Cholelithiasis with circumferential fluid/gallbladder wall thickening. If there is concern for acute cholecystitis, correlation with lab values and potentially HIDA scan may be considered. Inflammatory changes adjacent to the gallbladder extend towards the liver hilum surrounding portal venous structures, and there is small caliber splenic vein posterior to the pancreas. Portal vein not well evaluated given the absence of a delayed CT phase. If there is concern for portal vein abnormality/thrombus, repeat contrast-enhanced CT with 8 delayed portal venous phase  may  CULTURES: UC 1/23>>>  ANTIBIOTICS: Zosyn 1/23>>> SIGNIFICANT EVENTS:   LINES/TUBES:   DISCUSSION: This is a 66 year old male who was admitted for near syncope in the setting of symptomatic anemia. Source seems to be GI w/ findings of melena. Has been transfused and is now hemodynamically stable but unfortunately developed acute delirium and agitation. This required a total of 12mg  of ativan. He is now over sedated. Unclear if he was withdrawling or what exactly the etiology of his MS change is. For now we will move to the ICU, stop benzos, ck abg, ammonia,CT head and EEG.    ASSESSMENT / PLAN:  NEUROLOGIC A:   Acute encephalopathy.  Near syncope  -presume ETOH w/d, alternatively consider post-ictal state or sepsis. He has baclofen, Keppra and zoloft on his home med list. Not sure when these were last taken  P:   Dc lorazepam Place on precedex gtt Ck ammonia level Ck total CKs Ck Tsh CT head Ck ABG Repeat chemistry  EEG thiamine and folate supplementation    PULMONARY A: Apnea w/ acute hypoxic respiratory failure. Exacerbated by sedating medications P:   Transfer to the ICU O2 as needed ABG; repeat PCXR Limit sedating medications Not BIPAP candidate given Mental status   CARDIOVASCULAR A:  Hypovolemia-> seems to have improved.  Mild troponin I elevation P:  Trend trops  Cont IVFs Holding antihypertensives   RENAL A:   AKI Lactic acidosis as well as NAGMA --.suspect d/t climbing Cl w/ NaCl resuscitation   P:   Cont IVFs Re-assess chemistry, Might need to consider change IVFs to LR Strict I&O Hold any antihypertensives.   GASTROINTESTINAL A:   Melena  Possible colitis  P:   Cont PPI NPO EGD when MS more appropriate   HEMATOLOGIC A:   Acute/subacute blood loss anemia  Microcytic/hypochromic anemia  S/p transfusion 3 units.  P:  F/u cbc tranfuse for hgb <7 Holding anticoagulation  SCDs  INFECTIOUS A:   R/o colitis  P:   Trend  fever curve Trend wbc Zosyn      FAMILY  - Updates: pending   - Inter-disciplinary family meet or Palliative Care meeting due by:  1/30  Erick Colace ACNP-BC [place] Pager # [phone] OR # [phone] if no answer  Attending Note:  66 year old male with alcohol abuse history who presents to the hospital with pre-syncope and was noted to have severe anemia from UGI bleed.  While in the hospital developed DTs that required 12 mg of ativan in 6 hours.  PCCM was consulted since patient was combative and agitated.  Upon evaluating the patient, he is completely obtunded and not protecting his airway.  However, per RN when becomes agitated he is screaming.  She reports she just gave him 2 mg of IV ativan.  I reviewed chest CT myself, no evidence of acute aortic disease.  At this point, given concern for airway protection and agitation at times, I believe it will be safer to transfer patient to the ICU for closer observation.  Check TSH, head CT, ammonia level, electrolytes and LFT.  Maintain NPO.  Precedex if becomes agitated.  EEG is ordered as well, unsure if post ictal.  Continue current medications otherwise.  The patient is critically ill with multiple organ systems failure and requires high complexity decision making for assessment and support, frequent evaluation and titration of therapies, application of advanced monitoring technologies and extensive interpretation of multiple databases.   Critical Care Time devoted to patient care services described  in this note is  45  Minutes. This time reflects time of care of this signee Dr [name]. This critical care time does not reflect procedure time, or teaching time or supervisory time of PA/NP/Med student/Med Resident etc but could involve care discussion time.  Rush Farmer, M.D. [facility] Pulmonary/Critical Care Medicine. Pager: [phone]. After hours pager: [phone].  10/02/2016, 1:55 PM

## 2016-10-02 NOTE — ED Notes (Signed)
Paged by [employer]

## 2016-10-02 NOTE — ED Notes (Signed)
Asked pt for urine. Specimen will give urine sample when he can.

## 2016-10-02 NOTE — Progress Notes (Signed)
PROGRESS NOTE    Barry Mccoy  [name] DOB: 1951-08-05 DOA: 10/01/2016 PCP: No PCP Per Patient   Brief Narrative:  Patient admitted for multiple episodes of syncope concerns of GI bleed likely due to NSAID use. His Hb on Admission was 4.1 and received 3 units of PRBC and 2L Fluids.  Overnight he became extremely agitated.    Assessment & Plan:   Principal Problem:   Acute GI bleeding Active Problems:   Symptomatic anemia   Acute Agitation and AMS, concerns for alcohol withdrawal  -will start him on CIWA, scoring high at this time. -discussed with the ICU attending Dr Ashok Cordia, will schedule him for more frequent ativan and add valium for longer acting regimen. Add thiamine as well daily.  -supportive care at this time.  -check b12 folate  -no need for CT head - no focal deficit at this time.   Syncope -likely from dehydration/ low Hb and GI bleed -will cont IVF for now, consult GI  GI Bleed, unknown etiology at this time  -s/p 3 units prbc, GI consulted -cont PPI drip at this time, recheck CBC this morning.  -further plan once we determine the etiology of his bleeding  -keep him off antihypertensive   Non specific Colitis -unknown cause of it, vascular in nature? -cont fluids and antibiotics for now -Pain control  -CTA Chest, abd and pelvis done- no significant stenosis of abd blood vessels.   Anemia -likely chronic from blood loss anemia.  -monitor for now   Gallbladder stones -outpatient work up for now   Chronic low back pain -on keppra per the admitting physician? (patient unable to provide hx at this time in regards to why he is on it). Will cont IV form for now.     DVT prophylaxis:  SCDs Code Status: FULL  Disposition Plan: Maintain stepdown status, low threshold for upgrade to the ICU  CONTINUE 4 POINTS RESTRAINT FOR STAFF AND PATIENT SAFETY.   Consultants:   None  Procedures:   None  Antimicrobials:   None   Subjective: Patient is  very agitated this morning. I am unable to get any hx from him but overnight nurse tells me apparently he may have hx of alcohol abuse. No furhter episode of bleeding at this time. He is s/p 3 units of blood. So far he has received couple of doses of ativan and Haldol. He continues to be on 4 points restraints.   Objective: Vitals:   10/02/16 0400 10/02/16 0430 10/02/16 0500 10/02/16 0600  BP: 119/80 118/62 (!) 116/100 (!) 107/50  Pulse: (!) 128 83 87 72  Resp: 17 (!) 21 (!) 24 17  Temp:  98.1 F (36.7 C)    TempSrc:  Oral    SpO2: 97% 100% 100% 95%  Weight:      Height:        Intake/Output Summary (Last 24 hours) at 10/02/16 0820 Last data filed at 10/02/16 0430  Gross per 24 hour  Intake          1857.42 ml  Output                0 ml  Net          1857.42 ml   Filed Weights   10/01/16 1822 10/02/16 0320  Weight: 63.5 kg (140 lb) 106.7 kg (235 lb 3.2 oz)    Examination:  General exam: Pale and extremely agitated; diaphoretic.  Respiratory system: Clear to auscultation. Respiratory effort normal with tachypnea Cardiovascular  system: tacyhcardia, S1 & S2 heard, RRR. No JVD, murmurs, rubs, gallops or clicks. No pedal edema. Gastrointestinal system: Abdomen is nondistended, soft and nontender. No organomegaly or masses felt. Normal bowel sounds heard. Central nervous system: Alert and oriented. No focal neurological deficits. Extremities: Symmetric 5 x 5 power 4 point restraints in place.  Skin: No rashes, lesions or ulcers Psychiatry: unable to assess    Data Reviewed:   CBC:  Recent Labs Lab 10/01/16 2000  WBC 10.3  HGB 4.1*  HCT 13.9*  MCV 76.0*  PLT [id number]   Basic Metabolic Panel:  Recent Labs Lab 10/01/16 1846  NA 134*  K 3.9  CL 102  CO2 23  GLUCOSE 111*  BUN 16  CREATININE 1.21  CALCIUM 8.6*   GFR: Estimated Creatinine Clearance: 78.1 mL/min (by C-G formula based on SCr of 1.21 mg/dL). Liver Function Tests:  Recent Labs Lab 10/01/16 2112    AST 18  ALT 14*  ALKPHOS 62  BILITOT 0.4  PROT 5.9*  ALBUMIN 3.0*    Recent Labs Lab 10/01/16 2112  LIPASE 40   No results for input(s): AMMONIA in the last 168 hours. Coagulation Profile:  Recent Labs Lab 10/01/16 2000  INR 1.26   Cardiac Enzymes: No results for input(s): CKTOTAL, CKMB, CKMBINDEX, TROPONINI in the last 168 hours. BNP (last 3 results) No results for input(s): PROBNP in the last 8760 hours. HbA1C: No results for input(s): HGBA1C in the last 72 hours. CBG:  Recent Labs Lab 10/02/16 0018  GLUCAP 97   Lipid Profile: No results for input(s): CHOL, HDL, LDLCALC, TRIG, CHOLHDL, LDLDIRECT in the last 72 hours. Thyroid Function Tests: No results for input(s): TSH, T4TOTAL, FREET4, T3FREE, THYROIDAB in the last 72 hours. Anemia Panel: No results for input(s): VITAMINB12, FOLATE, FERRITIN, TIBC, IRON, RETICCTPCT in the last 72 hours. Sepsis Labs:  Recent Labs Lab 10/01/16 2234  LATICACIDVEN 2.15*    Recent Results (from the past 240 hour(s))  MRSA PCR Screening     Status: None   Collection Time: 10/02/16  3:32 AM  Result Value Ref Range Status   MRSA by PCR NEGATIVE NEGATIVE Final    Comment:        The GeneXpert MRSA Assay (FDA approved for NASAL specimens only), is one component of a comprehensive MRSA colonization surveillance program. It is not intended to diagnose MRSA infection nor to guide or monitor treatment for MRSA infections.          Radiology Studies: Ct Angio Chest/abd/pel For Dissection W And/or Wo Contrast  Result Date: 10/01/2016 CLINICAL DATA:  65 year old male with left-sided flank pain, concerning for dissection. EXAM: CT ANGIOGRAPHY CHEST, ABDOMEN AND PELVIS TECHNIQUE: Multidetector CT imaging through the chest, abdomen and pelvis was performed using the standard protocol during bolus administration of intravenous contrast. Multiplanar reconstructed images including MIPs were obtained and reviewed to evaluate the  vascular anatomy. CONTRAST:  100 cc Isovue 370 COMPARISON:  None. FINDINGS: CTA CHEST FINDINGS Cardiovascular: Heart: No cardiomegaly. No pericardial fluid/thickening. Calcifications of the left main, left anterior descending, circumflex coronary arteries. Aorta: Unremarkable course, caliber, contour of the thoracic aorta. No aneurysm or dissection flap. No periaortic fluid. Soft plaque of the aortic arch with minimal calcifications. Mixed atherosclerotic and soft plaque of the descending thoracic aorta. Pulmonary arteries: No central, lobar, segmental, or proximal subsegmental filling defects. Mediastinum/Nodes: Mediastinal lymph nodes are present, none of which are enlarged by CT size criteria. Unremarkable appearance of the thoracic esophagus. Unremarkable appearance of the thoracic  inlet and thyroid. Lungs/Pleura: No pneumothorax or pleural effusion. Centrilobular emphysema with minimal paraseptal emphysema at the lung apices. No confluent airspace disease. No significant bronchial wall thickening. No pneumothorax. Musculoskeletal: No displaced fracture. Degenerative changes of the thoracic spine. Review of the MIP images confirms the above findings. CTA ABDOMEN AND PELVIS FINDINGS VASCULAR Aorta: Mixed calcified and soft plaque of the abdominal aorta. No aneurysm. No periaortic fluid. No dissection. Celiac: Typical branch pattern of the celiac artery, with left gastric, common hepatic, and splenic artery identified. SMA: No significant atherosclerotic changes of the superior mesenteric artery. Renals: Renal arteries are patent. Single left and single right renal artery. IMA: Inferior mesenteric artery is patent. Left colic artery patent. Superior rectal artery patent. Right lower extremity: Unremarkable course, caliber, and contour of the right iliac system. No aneurysm, dissection, or occlusion. Hypogastric artery is patent. Anterior and posterior division patent. Common femoral artery patent. Proximal SFA and  profunda femoris patent. Left lower extremity: Unremarkable course, caliber, and contour of the left iliac system. No aneurysm, dissection, or occlusion. Hypogastric artery is patent. Anterior and posterior division patent. Common femoral artery patent. Proximal SFA and profunda femoris patent. Veins: Splenic vein appears attenuated posterior to the body of the pancreas, though not well evaluated secondary to the phase of the contrast. Portal vein not well evaluated by the phase of the contrast bolus. Review of the MIP images confirms the above findings. NON-VASCULAR Hepatobiliary: Unremarkable appearance of liver. The gallbladder demonstrates lumen full of non radiopaque stones. Pericholecystic fluid is present with questionable gallbladder wall thickening. No intrahepatic or extrahepatic biliary ductal dilatation. Pancreas: No inflammatory changes at the head of the pancreas. Body the pancreas relatively unremarkable. Coarse calcification the tail of the pancreas. Focal fluid at the tail the pancreas extending towards the inferior pole of the spleen and adjacent to the splenic flexure, within the splenic colic ligament. No pancreatic ductal dilation. Spleen: Fluid and inflammatory changes adjacent to the inferior tip of the spleen. Adrenals/Urinary Tract: Unremarkable appearance of adrenal glands. No evidence of right-sided or left-sided hydronephrosis. Likely nonobstructive nephrolithiasis bilateral kidneys. Course the bilateral ureters unremarkable. Unremarkable appearance of the urinary bladder. Stomach/Bowel: Inflammatory changes adjacent to the splenic flexure of the colon. Circumferential wall thickening of the distal transverse colon and sigmoid colon. Small fat focus on the lateral sigmoid colon (image 170 series 6). Extensive colonic diverticula of the sigmoid colon, descending colon, and the distal transverse colon. Unremarkable stomach. Unremarkable small bowel with no abnormal distention. Normal  appendix. Lymphatic: Lymph nodes within the preaortic and para-aortic nodal station. Mesenteric: Inflammatory changes within the left upper quadrant adjacent to the spleen. Inflammatory changes of the fat adjacent the gallbladder extending into the hilum of the liver surrounding portal venous structures. No free fluid. Reproductive: Unremarkable appearance of the pelvic organs. Other: No hernia. Musculoskeletal: No acute fracture identified. Multilevel degenerative changes of the lower lumbar spine worst at the L2-L3 level and L5-S1 level. Bilateral facet disease worst in the lower lumbar spine. Degenerative changes bilateral hips. Review of the MIP images confirms the above findings. IMPRESSION: Study is negative for findings of acute aortic syndrome. Significant inflammatory changes within the left upper quadrant at the tail the pancreas and splenic flexure of the colon. There is apparent circumferential colon wall thickening, and small volume of free fluid. Differential diagnosis includes distal pancreatitis, diverticulitis/epiploic appendagitis, focal ischemia, or potentially sequela of portal hypertension/sinistral portal hypertension. Considered much less likely would be occult colon cancer with perforation. Cholelithiasis with circumferential  fluid/gallbladder wall thickening. If there is concern for acute cholecystitis, correlation with lab values and potentially HIDA scan may be considered. Inflammatory changes adjacent to the gallbladder extend towards the liver hilum surrounding portal venous structures, and there is small caliber splenic vein posterior to the pancreas. Portal vein not well evaluated given the absence of a delayed CT phase. If there is concern for portal vein abnormality/thrombus, repeat contrast-enhanced CT with 8 delayed portal venous phase may be considered. The above results were called by telephone at the time of interpretation on 10/01/2016 at 9:30 pm to Dr. Ovid Curd PAGE , who  verbally acknowledged these results. Aortic atherosclerosis without aneurysm. Coarse calcification the tail of the pancreas, potentially representing chronic pancreatitis/prior pancreatitis. Centrilobular emphysema with minimal paraseptal emphysema. Coronary artery disease. Signed, Dulcy Fanny. Earleen Newport, DO [facility] Radiology Electronically Signed   By: Corrie Mckusick D.O.   On: 10/01/2016 21:34        Scheduled Meds: . LORazepam      . LORazepam      . levETIRAcetam  1,500 mg Intravenous QHS  . nicotine  21 mg Transdermal Daily  . [START ON 10/05/2016] pantoprazole  40 mg Intravenous Q12H  . piperacillin-tazobactam (ZOSYN)  IV  3.375 g Intravenous Q8H   Continuous Infusions: . sodium chloride 100 mL/hr at 10/02/16 0020  . pantoprozole (PROTONIX) infusion 8 mg/hr (10/02/16 0213)     LOS: 1 day    Time spent: 40 mins     Ankit Arsenio Loader, MD [facility] Pager [phone]   If 7PM-7AM, please contact night-coverage [email or web address] Password [email or web address] 10/02/2016, 8:20 AM

## 2016-10-02 NOTE — Progress Notes (Signed)
Patient not available for EEG at this time. He will be moving to an ICU shortly per Nira Conn, RN

## 2016-10-02 NOTE — Progress Notes (Signed)
CRITICAL VALUE ALERT  Critical value received: Troponin 0.04  Date of notification: 10/02/16  Time of notification: 1104  Critical value read back:Yes.    Nurse who received alert: Norva Karvonen, RN   MD notified (1st page): Dr. Reesa Chew  Time of first page: 11:09      Responding MD:  Dr. Reesa Chew  Time MD responded:  11:18

## 2016-10-02 NOTE — ED Notes (Signed)
Pt receiving blood transfusion at this time.

## 2016-10-02 NOTE — Progress Notes (Signed)
Called by bedside RN, patient is deteriorating from a respiratory standpoint.  After evaluation, decision was made to intubate, sedate via precedex.  Informed primary MD that we will take patient down to ICU and intubate.  Will evaluate if central line is needed after patient is more stable.  The patient is critically ill with multiple organ systems failure and requires high complexity decision making for assessment and support, frequent evaluation and titration of therapies, application of advanced monitoring technologies and extensive interpretation of multiple databases.   Critical Care Time devoted to patient care services described in this note is  45  Minutes. This time reflects time of care of this signee Dr [name]. This critical care time does not reflect procedure time, or teaching time or supervisory time of PA/NP/Med student/Med Resident etc but could involve care discussion time.  Rush Farmer, M.D. [facility] Pulmonary/Critical Care Medicine. Pager: [phone]. After hours pager: [phone].

## 2016-10-02 NOTE — Consult Note (Signed)
Reason for Consult: Melena and severe anemia Referring Physician: [facility]  Normand Sloop HPI: This is a 66 year old male without any significant PMH admitted for syncope.  During the work up his admission HGB was found to be at 4.1 g/dL.  He states that he has had near syncopal events over the past month and DOE.  There is a history of melena and recent NSAID use for complaints of low back pain.  Per the hospital notes, he had a colonoscopy at [facility] 8 months ago for a history of polyps.  Currently he is suffering from severe AMS.  Nursing reports that sometime during the evening his mental status acutely changed.  He was combative and disoriented, which required four point restraints.  Currently the patient is going to be transferred to ICU for further management.  Past Medical History:  Diagnosis Date  . Muscle spasm     Past Surgical History:  Procedure Laterality Date  . ELBOW SURGERY      Family History  Problem Relation Age of Onset  . Colon cancer Brother     Social History:  reports that he has been smoking Cigarettes.  He has been smoking about 1.00 pack per day. He has never used smokeless tobacco. He reports that he does not drink alcohol or use drugs.  Allergies: No Known Allergies  Medications:  Scheduled: . levETIRAcetam  1,500 mg Intravenous QHS  . nicotine  21 mg Transdermal Daily  . [START ON 10/05/2016] pantoprazole  40 mg Intravenous Q12H  . piperacillin-tazobactam (ZOSYN)  IV  3.375 g Intravenous Q8H  . thiamine injection  100 mg Intravenous Daily   Continuous: . sodium chloride 100 mL/hr at 10/02/16 0020  . pantoprozole (PROTONIX) infusion 8 mg/hr (10/02/16 0213)    Results for orders placed or performed during the hospital encounter of 10/01/16 (from the past 24 hour(s))  Basic metabolic panel     Status: Abnormal   Collection Time: 10/01/16  6:46 PM  Result Value Ref Range   Sodium 134 (L) 135 - 145 mmol/L   Potassium 3.9 3.5 - 5.1  mmol/L   Chloride 102 101 - 111 mmol/L   CO2 23 22 - 32 mmol/L   Glucose, Bld 111 (H) 65 - 99 mg/dL   BUN 16 6 - 20 mg/dL   Creatinine, Ser 1.21 0.61 - 1.24 mg/dL   Calcium 8.6 (L) 8.9 - 10.3 mg/dL   GFR calc non Af Amer >60 >60 mL/min   GFR calc Af Amer >60 >60 mL/min   Anion gap 9 5 - 15  CBC     Status: Abnormal   Collection Time: 10/01/16  8:00 PM  Result Value Ref Range   WBC 10.3 4.0 - 10.5 K/uL   RBC 1.83 (L) 4.22 - 5.81 MIL/uL   Hemoglobin 4.1 (LL) 13.0 - 17.0 g/dL   HCT 13.9 (L) 39.0 - 52.0 %   MCV 76.0 (L) 78.0 - 100.0 fL   MCH 22.4 (L) 26.0 - 34.0 pg   MCHC 29.5 (L) 30.0 - 36.0 g/dL   RDW 19.5 (H) 11.5 - 15.5 %   Platelets 327 150 - 400 K/uL  Protime-INR     Status: Abnormal   Collection Time: 10/01/16  8:00 PM  Result Value Ref Range   Prothrombin Time 15.9 (H) 11.4 - 15.2 seconds   INR 1.26   Type and screen [place]     Status: None (Preliminary result)   Collection Time:  10/01/16  8:07 PM  Result Value Ref Range   ISSUE DATE / TIME [id number]    Blood Product Unit Number [id number]    PRODUCT CODE [id number]    Unit Type and Rh 6200    Blood Product Expiration Date V154338    ISSUE DATE / TIME [id number]    Blood Product Unit Number [id number]    PRODUCT CODE [id number]    Unit Type and Rh [id number]    Blood Product Expiration Date V154338    ISSUE DATE / TIME [id number]    Blood Product Unit Number [id number]    PRODUCT CODE [id number]    Unit Type and Rh 6200    Blood Product Expiration Date V154338   ABO/Rh     Status: None   Collection Time: 10/01/16  8:07 PM  Result Value Ref Range   ABO/RH(D) A POS   Prepare RBC     Status: None   Collection Time: 10/01/16  9:05 PM  Result Value Ref Range   Order Confirmation ORDER PROCESSED BY BLOOD BANK   Lipase, blood     Status: None   Collection Time: 10/01/16  9:12 PM  Result Value Ref Range   Lipase 40 11 - 51 U/L  Hepatic function panel     Status: Abnormal    Collection Time: 10/01/16  9:12 PM  Result Value Ref Range   Total Protein 5.9 (L) 6.5 - 8.1 g/dL   Albumin 3.0 (L) 3.5 - 5.0 g/dL   AST 18 15 - 41 U/L   ALT 14 (L) 17 - 63 U/L   Alkaline Phosphatase 62 38 - 126 U/L   Total Bilirubin 0.4 0.3 - 1.2 mg/dL   Bilirubin, Direct 0.1 0.1 - 0.5 mg/dL   Indirect Bilirubin 0.3 0.3 - 0.9 mg/dL  I-Stat CG4 Lactic Acid, ED     Status: Abnormal   Collection Time: 10/01/16 10:34 PM  Result Value Ref Range   Lactic Acid, Venous 2.15 (HH) 0.5 - 1.9 mmol/L   Comment NOTIFIED PHYSICIAN   CBG monitoring, ED     Status: None   Collection Time: 10/02/16 12:18 AM  Result Value Ref Range   Glucose-Capillary 97 65 - 99 mg/dL  Urinalysis, Routine w reflex microscopic     Status: Abnormal   Collection Time: 10/02/16  1:00 AM  Result Value Ref Range   Color, Urine YELLOW YELLOW   APPearance CLEAR CLEAR   Specific Gravity, Urine >1.046 (H) 1.005 - 1.030   pH 5.0 5.0 - 8.0   Glucose, UA NEGATIVE NEGATIVE mg/dL   Hgb urine dipstick NEGATIVE NEGATIVE   Bilirubin Urine NEGATIVE NEGATIVE   Ketones, ur NEGATIVE NEGATIVE mg/dL   Protein, ur NEGATIVE NEGATIVE mg/dL   Nitrite NEGATIVE NEGATIVE   Leukocytes, UA NEGATIVE NEGATIVE  MRSA PCR Screening     Status: None   Collection Time: 10/02/16  3:32 AM  Result Value Ref Range   MRSA by PCR NEGATIVE NEGATIVE  CBC WITH DIFFERENTIAL     Status: Abnormal   Collection Time: 10/02/16  9:55 AM  Result Value Ref Range   WBC 18.4 (H) 4.0 - 10.5 K/uL   RBC 2.53 (L) 4.22 - 5.81 MIL/uL   Hemoglobin 6.3 (LL) 13.0 - 17.0 g/dL   HCT 20.2 (L) 39.0 - 52.0 %   MCV 79.8 78.0 - 100.0 fL   MCH 24.9 (L) 26.0 - 34.0 pg   MCHC 31.2 30.0 - 36.0 g/dL   RDW 19.5 (H) 11.5 - 15.5 %  Platelets 308 150 - 400 K/uL   Neutrophils Relative % 74 %   Neutro Abs 13.6 (H) 1.7 - 7.7 K/uL   Lymphocytes Relative 21 %   Lymphs Abs 3.8 0.7 - 4.0 K/uL   Monocytes Relative 5 %   Monocytes Absolute 0.9 0.1 - 1.0 K/uL   Eosinophils Relative 0 %    Eosinophils Absolute 0.0 0.0 - 0.7 K/uL   Basophils Relative 0 %   Basophils Absolute 0.0 0.0 - 0.1 K/uL  Comprehensive metabolic panel     Status: Abnormal   Collection Time: 10/02/16  9:55 AM  Result Value Ref Range   Sodium 137 135 - 145 mmol/L   Potassium 4.0 3.5 - 5.1 mmol/L   Chloride 106 101 - 111 mmol/L   CO2 18 (L) 22 - 32 mmol/L   Glucose, Bld 138 (H) 65 - 99 mg/dL   BUN 19 6 - 20 mg/dL   Creatinine, Ser 1.41 (H) 0.61 - 1.24 mg/dL   Calcium 8.4 (L) 8.9 - 10.3 mg/dL   Total Protein 6.1 (L) 6.5 - 8.1 g/dL   Albumin 3.0 (L) 3.5 - 5.0 g/dL   AST 33 15 - 41 U/L   ALT 20 17 - 63 U/L   Alkaline Phosphatase 70 38 - 126 U/L   Total Bilirubin 2.6 (H) 0.3 - 1.2 mg/dL   GFR calc non Af Amer 50 (L) >60 mL/min   GFR calc Af Amer 58 (L) >60 mL/min   Anion gap 13 5 - 15  Troponin I     Status: Abnormal   Collection Time: 10/02/16  9:55 AM  Result Value Ref Range   Troponin I 0.04 (HH) <0.03 ng/mL  BLOOD TRANSFUSION REPORT - SCANNED     Status: None   Collection Time: 10/02/16 12:04 PM   Narrative   Ordered by an unspecified provider.     Ct Angio Chest/abd/pel For Dissection W And/or Wo Contrast  Result Date: 10/01/2016 CLINICAL DATA:  66 year old male with left-sided flank pain, concerning for dissection. EXAM: CT ANGIOGRAPHY CHEST, ABDOMEN AND PELVIS TECHNIQUE: Multidetector CT imaging through the chest, abdomen and pelvis was performed using the standard protocol during bolus administration of intravenous contrast. Multiplanar reconstructed images including MIPs were obtained and reviewed to evaluate the vascular anatomy. CONTRAST:  100 cc Isovue 370 COMPARISON:  None. FINDINGS: CTA CHEST FINDINGS Cardiovascular: Heart: No cardiomegaly. No pericardial fluid/thickening. Calcifications of the left main, left anterior descending, circumflex coronary arteries. Aorta: Unremarkable course, caliber, contour of the thoracic aorta. No aneurysm or dissection flap. No periaortic fluid. Soft  plaque of the aortic arch with minimal calcifications. Mixed atherosclerotic and soft plaque of the descending thoracic aorta. Pulmonary arteries: No central, lobar, segmental, or proximal subsegmental filling defects. Mediastinum/Nodes: Mediastinal lymph nodes are present, none of which are enlarged by CT size criteria. Unremarkable appearance of the thoracic esophagus. Unremarkable appearance of the thoracic inlet and thyroid. Lungs/Pleura: No pneumothorax or pleural effusion. Centrilobular emphysema with minimal paraseptal emphysema at the lung apices. No confluent airspace disease. No significant bronchial wall thickening. No pneumothorax. Musculoskeletal: No displaced fracture. Degenerative changes of the thoracic spine. Review of the MIP images confirms the above findings. CTA ABDOMEN AND PELVIS FINDINGS VASCULAR Aorta: Mixed calcified and soft plaque of the abdominal aorta. No aneurysm. No periaortic fluid. No dissection. Celiac: Typical branch pattern of the celiac artery, with left gastric, common hepatic, and splenic artery identified. SMA: No significant atherosclerotic changes of the superior mesenteric artery. Renals: Renal arteries are patent.  Single left and single right renal artery. IMA: Inferior mesenteric artery is patent. Left colic artery patent. Superior rectal artery patent. Right lower extremity: Unremarkable course, caliber, and contour of the right iliac system. No aneurysm, dissection, or occlusion. Hypogastric artery is patent. Anterior and posterior division patent. Common femoral artery patent. Proximal SFA and profunda femoris patent. Left lower extremity: Unremarkable course, caliber, and contour of the left iliac system. No aneurysm, dissection, or occlusion. Hypogastric artery is patent. Anterior and posterior division patent. Common femoral artery patent. Proximal SFA and profunda femoris patent. Veins: Splenic vein appears attenuated posterior to the body of the pancreas, though  not well evaluated secondary to the phase of the contrast. Portal vein not well evaluated by the phase of the contrast bolus. Review of the MIP images confirms the above findings. NON-VASCULAR Hepatobiliary: Unremarkable appearance of liver. The gallbladder demonstrates lumen full of non radiopaque stones. Pericholecystic fluid is present with questionable gallbladder wall thickening. No intrahepatic or extrahepatic biliary ductal dilatation. Pancreas: No inflammatory changes at the head of the pancreas. Body the pancreas relatively unremarkable. Coarse calcification the tail of the pancreas. Focal fluid at the tail the pancreas extending towards the inferior pole of the spleen and adjacent to the splenic flexure, within the splenic colic ligament. No pancreatic ductal dilation. Spleen: Fluid and inflammatory changes adjacent to the inferior tip of the spleen. Adrenals/Urinary Tract: Unremarkable appearance of adrenal glands. No evidence of right-sided or left-sided hydronephrosis. Likely nonobstructive nephrolithiasis bilateral kidneys. Course the bilateral ureters unremarkable. Unremarkable appearance of the urinary bladder. Stomach/Bowel: Inflammatory changes adjacent to the splenic flexure of the colon. Circumferential wall thickening of the distal transverse colon and sigmoid colon. Small fat focus on the lateral sigmoid colon (image 170 series 6). Extensive colonic diverticula of the sigmoid colon, descending colon, and the distal transverse colon. Unremarkable stomach. Unremarkable small bowel with no abnormal distention. Normal appendix. Lymphatic: Lymph nodes within the preaortic and para-aortic nodal station. Mesenteric: Inflammatory changes within the left upper quadrant adjacent to the spleen. Inflammatory changes of the fat adjacent the gallbladder extending into the hilum of the liver surrounding portal venous structures. No free fluid. Reproductive: Unremarkable appearance of the pelvic organs. Other:  No hernia. Musculoskeletal: No acute fracture identified. Multilevel degenerative changes of the lower lumbar spine worst at the L2-L3 level and L5-S1 level. Bilateral facet disease worst in the lower lumbar spine. Degenerative changes bilateral hips. Review of the MIP images confirms the above findings. IMPRESSION: Study is negative for findings of acute aortic syndrome. Significant inflammatory changes within the left upper quadrant at the tail the pancreas and splenic flexure of the colon. There is apparent circumferential colon wall thickening, and small volume of free fluid. Differential diagnosis includes distal pancreatitis, diverticulitis/epiploic appendagitis, focal ischemia, or potentially sequela of portal hypertension/sinistral portal hypertension. Considered much less likely would be occult colon cancer with perforation. Cholelithiasis with circumferential fluid/gallbladder wall thickening. If there is concern for acute cholecystitis, correlation with lab values and potentially HIDA scan may be considered. Inflammatory changes adjacent to the gallbladder extend towards the liver hilum surrounding portal venous structures, and there is small caliber splenic vein posterior to the pancreas. Portal vein not well evaluated given the absence of a delayed CT phase. If there is concern for portal vein abnormality/thrombus, repeat contrast-enhanced CT with 8 delayed portal venous phase may be considered. The above results were called by telephone at the time of interpretation on 10/01/2016 at 9:30 pm to Dr. Ovid Curd PAGE , who verbally  acknowledged these results. Aortic atherosclerosis without aneurysm. Coarse calcification the tail of the pancreas, potentially representing chronic pancreatitis/prior pancreatitis. Centrilobular emphysema with minimal paraseptal emphysema. Coronary artery disease. Signed, Dulcy Fanny. Earleen Newport, DO [facility] Radiology Electronically Signed    By: Corrie Mckusick D.O.   On: 10/01/2016 21:34    ROS:  As stated above in the HPI otherwise negative.  Blood pressure 130/82, pulse 93, temperature 98.6 F (37 C), temperature source Axillary, resp. rate (!) 26, height 6\' 2"  (1.88 m), weight 106.7 kg (235 lb 3.2 oz), SpO2 (!) 89 %.    PE: Gen: Sedated. Lungs: CTA Bilaterally CV: RRR ABM: Soft, NTND, +BS Ext: No C/C/E  Assessment/Plan: 1) Melena. 2) Anemia. 3) Syncope. 4) Severe AMS.   This appears to be a slow bleed.  He is hemodynamically stable, but his mental status is severely altered.  The etiology is unknown.  There is no history about any ETOH use/abuse, etc.  He requires an EGD for further evaluation, however, this procedure will be delayed until his mental status improves.    Plan: 1) Work up and treat AMS. 2) Follow HGB and transfuse as necessary. 3) GI will follow and perform an EGD when AMS improves or if there is an emergent need.  Bridget Westbrooks D 10/02/2016, 1:30 PM

## 2016-10-02 NOTE — Significant Event (Signed)
Rapid Response Event Note  Overview: Time Called: 0729 Arrival Time: 0733 Event Type: Other (Comment) (DTs)  Initial Focused Assessment: Patient very agitated in 4 point restrains, not communicating or responding to staff. Skin color is pale.  Mottled legs  Lung sounds clear  Heart tones regular. When agitated HR 130s  When calm HR 100s BP 125/57  RR 32  O2 sat 100% on [place] He is cool and diaphoretic No IV access Dr Reesa Chew at bedside   Interventions: Ativan given IM x 2 he would calm down for a few minutes then be very agitated again. CCM consulted, asked to maximize PRN meds IV team able to place PIV Left forearm. Valium given IV When patient calm and sleep he will obstruct his airway  O2 sats decrease to 69 then quickly improve to 95%   Plan of Care (if not transferred): MD wrote orders for increased medication administration. Patient continued to cycle between being completely sedated to completely agitated with medication 1600 CCM at bedside:  Plan transfer patient to ICU   Event Summary: Name of Physician Notified: Reesa Chew at [place]  Name of Consulting Physician Notified: Yacoub at 0800  Outcome: Transferred (Comment)  Event End Time: 0920  Raliegh Ip

## 2016-10-03 ENCOUNTER — Inpatient Hospital Stay (HOSPITAL_COMMUNITY): Payer: Medicare Other

## 2016-10-03 LAB — CBC
HCT: 25.8 % — ABNORMAL LOW (ref 39.0–52.0)
HCT: 25.5 % — ABNORMAL LOW (ref 39.0–52.0)
HEMATOCRIT: 26.4 % — AB (ref 39.0–52.0)
HEMOGLOBIN: 8.1 g/dL — AB (ref 13.0–17.0)
HEMOGLOBIN: 7.8 g/dL — AB (ref 13.0–17.0)
Hemoglobin: 8.2 g/dL — ABNORMAL LOW (ref 13.0–17.0)
MCH: 25.6 pg — AB (ref 26.0–34.0)
MCH: 25.5 pg — ABNORMAL LOW (ref 26.0–34.0)
MCH: 25.6 pg — AB (ref 26.0–34.0)
MCHC: 31.4 g/dL (ref 30.0–36.0)
MCHC: 31.1 g/dL (ref 30.0–36.0)
MCHC: 30.6 g/dL (ref 30.0–36.0)
MCV: 81.4 fL (ref 78.0–100.0)
MCV: 82.2 fL (ref 78.0–100.0)
MCV: 83.6 fL (ref 78.0–100.0)
Platelets: 269 10*3/uL (ref 150–400)
Platelets: 262 10*3/uL (ref 150–400)
Platelets: 246 10*3/uL (ref 150–400)
RBC: 3.17 MIL/uL — ABNORMAL LOW (ref 4.22–5.81)
RBC: 3.21 MIL/uL — ABNORMAL LOW (ref 4.22–5.81)
RBC: 3.05 MIL/uL — AB (ref 4.22–5.81)
RDW: 17.9 % — ABNORMAL HIGH (ref 11.5–15.5)
RDW: 18.5 % — AB (ref 11.5–15.5)
RDW: 18.9 % — ABNORMAL HIGH (ref 11.5–15.5)
WBC: 12.4 10*3/uL — ABNORMAL HIGH (ref 4.0–10.5)
WBC: 13.2 10*3/uL — AB (ref 4.0–10.5)
WBC: 12.3 10*3/uL — ABNORMAL HIGH (ref 4.0–10.5)

## 2016-10-03 LAB — COMPREHENSIVE METABOLIC PANEL
ALBUMIN: 2.7 g/dL — AB (ref 3.5–5.0)
ALT: 32 U/L (ref 17–63)
ANION GAP: 6 (ref 5–15)
AST: 43 U/L — ABNORMAL HIGH (ref 15–41)
Alkaline Phosphatase: 64 U/L (ref 38–126)
BUN: 21 mg/dL — ABNORMAL HIGH (ref 6–20)
CO2: 23 mmol/L (ref 22–32)
Calcium: 8.2 mg/dL — ABNORMAL LOW (ref 8.9–10.3)
Chloride: 111 mmol/L (ref 101–111)
Creatinine, Ser: 1.27 mg/dL — ABNORMAL HIGH (ref 0.61–1.24)
GFR calc Af Amer: >60 mL/min (ref >60)
GFR calc non Af Amer: 57 mL/min — ABNORMAL LOW (ref >60)
Glucose, Bld: 92 mg/dL (ref 65–99)
POTASSIUM: 4.2 mmol/L (ref 3.5–5.1)
SODIUM: 140 mmol/L (ref 135–145)
TOTAL PROTEIN: 5.7 g/dL — AB (ref 6.5–8.1)
Total Bilirubin: 1.7 mg/dL — ABNORMAL HIGH (ref 0.3–1.2)

## 2016-10-03 LAB — CBC WITH DIFFERENTIAL/PLATELET
BASOS ABS: 0 10*3/uL (ref 0.0–0.1)
BASOS PCT: 0 %
Eosinophils Absolute: 0 10*3/uL (ref 0.0–0.7)
Eosinophils Relative: 0 %
HEMATOCRIT: 20.2 % — AB (ref 39.0–52.0)
Hemoglobin: 6.3 g/dL — CL (ref 13.0–17.0)
Lymphocytes Relative: 21 %
Lymphs Abs: 3.8 10*3/uL (ref 0.7–4.0)
MCH: 24.9 pg — ABNORMAL LOW (ref 26.0–34.0)
MCHC: 31.2 g/dL (ref 30.0–36.0)
MCV: 79.8 fL (ref 78.0–100.0)
Monocytes Absolute: 0.9 10*3/uL (ref 0.1–1.0)
Monocytes Relative: 5 %
NEUTROS ABS: 13.6 10*3/uL — AB (ref 1.7–7.7)
NEUTROS PCT: 74 %
PLATELETS: 308 10*3/uL (ref 150–400)
RBC: 2.53 MIL/uL — ABNORMAL LOW (ref 4.22–5.81)
RDW: 19.5 % — AB (ref 11.5–15.5)
WBC: 18.4 10*3/uL — ABNORMAL HIGH (ref 4.0–10.5)

## 2016-10-03 LAB — TYPE AND SCREEN
BLOOD PRODUCT EXPIRATION DATE: 201802072359
BLOOD PRODUCT EXPIRATION DATE: 201802072359
BLOOD PRODUCT EXPIRATION DATE: 201802082359
Blood Product Expiration Date: 201802072359
Blood Product Expiration Date: 201802082359
ISSUE DATE / TIME: 201801230152
ISSUE DATE / TIME: 201801232226
ISSUE DATE / TIME: 201801222235
ISSUE DATE / TIME: 201801230447
ISSUE DATE / TIME: 201801232056
UNIT TYPE AND RH: 6200
Unit Type and Rh: 6200
Unit Type and Rh: 6200
Unit Type and Rh: 6200
Unit Type and Rh: 6200

## 2016-10-03 LAB — GLUCOSE, CAPILLARY
GLUCOSE-CAPILLARY: 96 mg/dL (ref 65–99)
Glucose-Capillary: 108 mg/dL — ABNORMAL HIGH (ref 65–99)
Glucose-Capillary: 105 mg/dL — ABNORMAL HIGH (ref 65–99)
Glucose-Capillary: 94 mg/dL (ref 65–99)
Glucose-Capillary: 110 mg/dL — ABNORMAL HIGH (ref 65–99)
Glucose-Capillary: 90 mg/dL (ref 65–99)

## 2016-10-03 LAB — APTT: aPTT: 37 seconds — ABNORMAL HIGH (ref 24–36)

## 2016-10-03 LAB — BLOOD GAS, ARTERIAL
ACID-BASE DEFICIT: 2.3 mmol/L — AB (ref 0.0–2.0)
BICARBONATE: 21.2 mmol/L (ref 20.0–28.0)
Drawn by: 25788
FIO2: 44
O2 Saturation: 94.8 %
PATIENT TEMPERATURE: 98.6
PH ART: 7.44 (ref 7.350–7.450)
PO2 ART: 72.6 mmHg — AB (ref 83.0–108.0)
pCO2 arterial: 31.8 mmHg — ABNORMAL LOW (ref 32.0–48.0)

## 2016-10-03 LAB — PROCALCITONIN: PROCALCITONIN: 3.18 ng/mL

## 2016-10-03 LAB — PROTIME-INR
INR: 1.34
Prothrombin Time: 16.7 seconds — ABNORMAL HIGH (ref 11.4–15.2)

## 2016-10-03 LAB — URINE CULTURE

## 2016-10-03 LAB — TROPONIN I
Troponin I: 0.05 ng/mL (ref <0.03)
Troponin I: 0.04 ng/mL (ref <0.03)

## 2016-10-03 LAB — VITAMIN B12: VITAMIN B 12: 241 pg/mL (ref 180–914)

## 2016-10-03 LAB — LACTIC ACID, PLASMA: LACTIC ACID, VENOUS: 0.9 mmol/L (ref 0.5–1.9)

## 2016-10-03 MED ORDER — SODIUM CHLORIDE 0.9 % IV SOLN: INTRAVENOUS | Status: DC

## 2016-10-03 MED ORDER — MIDAZOLAM HCL 5 MG/ML IJ SOLN
1.0000 mg/h | INTRAMUSCULAR | Status: DC
  Administered 2016-10-03: 5 mg/h via INTRAVENOUS
  Administered 2016-10-03: 1 mg/h via INTRAVENOUS
  Administered 2016-10-04: 7 mg/h via INTRAVENOUS
  Administered 2016-10-04: 6 mg/h via INTRAVENOUS
  Administered 2016-10-05 (×2): 3 mg/h via INTRAVENOUS
  Administered 2016-10-06: 2 mg/h via INTRAVENOUS
  Filled 2016-10-03 (×8): qty 10

## 2016-10-03 MED ORDER — LACTATED RINGERS IV SOLN
INTRAVENOUS | Status: DC
  Administered 2016-10-03 – 2016-10-05 (×4): via INTRAVENOUS

## 2016-10-03 MED ORDER — FOLIC ACID 5 MG/ML IJ SOLN
1.0000 mg | Freq: Every day | INTRAMUSCULAR | Status: DC
  Administered 2016-10-03 – 2016-10-09 (×7): 1 mg via INTRAVENOUS
  Filled 2016-10-03 (×7): qty 0.2

## 2016-10-03 NOTE — Progress Notes (Signed)
Patient not available for EEG at this time. Pt will be going for a procedure. Nurse will let us know if he becomes available soon or will try again tomorrow as schedule permits.

## 2016-10-03 NOTE — Progress Notes (Signed)
PULMONARY / CRITICAL CARE MEDICINE   Name: Barry Mccoy MRN: [id number] DOB: 1950-11-18    ADMISSION DATE:  10/01/2016 CONSULTATION DATE:  1/23  REFERRING MD:  Reesa Chew, MD ([facility])  CHIEF COMPLAINT:  Acute Encephalopathy and intermittent hypoxia   SUBJECTIVE:  Intubated, sedated  VITAL SIGNS: BP (!) 87/50   Pulse (!) 56   Temp 97.7 F (36.5 C) (Oral)   Resp 14   Ht 6\' 2"  (1.88 m)   Wt 238 lb 1.6 oz (108 kg)   SpO2 97%   BMI 30.57 kg/m   HEMODYNAMICS:    VENTILATOR SETTINGS: Vent Mode: PRVC FiO2 (%):  [30 %-100 %] 30 % Set Rate:  [14 bmp-16 bmp] 14 bmp Vt Set:  [500 mL-660 mL] 660 mL PEEP:  [5 cmH20] 5 cmH20 Plateau Pressure:  [11 cmH20-20 cmH20] 17 cmH20  INTAKE / OUTPUT: I/O last 3 completed shifts: In: 5729.6 [I.V.:2628.4; Blood:2736.2; IV Piggyback:365] Out: 900 [Urine:900]  PHYSICAL EXAMINATION: General appearance: NAD  Eyes: anicteric sclera, PERRL, EOMI bilaterally. Mouth:  membranes and no mucosal ulcerations; normal hard and soft palate, tongue and mouth is dry  Neck: Trachea midline; neck supple, no JVD Lungs/chest: No wheezes CV: RRR, no MRGs  Abdomen: Soft, non-tender; no masses or HSM Extremities: No peripheral edema or extremity lymphadenopathy Skin: Normal temperature, turgor and texture; no rash, ulcers or subcutaneous nodules, pale as mentioned above  Psych/neuro: currently sedated    LABS:  BMET  Recent Labs Lab 10/01/16 1846 10/02/16 0955 10/02/16 1515  NA 134* 137 137  K 3.9 4.0 4.0  CL 102 106 108  CO2 23 18* 20*  BUN 16 19 21*  CREATININE 1.21 1.41* 1.19  GLUCOSE 111* 138* 89    Electrolytes  Recent Labs Lab 10/01/16 1846 10/02/16 0955 10/02/16 1515  CALCIUM 8.6* 8.4* 8.3*    CBC  Recent Labs Lab 10/02/16 0955 10/02/16 1900 10/03/16 0239  WBC 18.4* 11.6* 12.4*  HGB 6.3* 6.1* 8.1*  HCT 20.2* 19.7* 25.8*  PLT 308 296 269    Coag's  Recent Labs Lab 10/01/16 2000 10/03/16 0239  APTT  --  37*  INR  1.26 1.34    Sepsis Markers  Recent Labs Lab 10/01/16 2234  LATICACIDVEN 2.15*    ABG  Recent Labs Lab 10/02/16 1530 10/02/16 2113  PHART 7.440 7.361  PCO2ART 31.8* 40.3  PO2ART 72.6* 180.0*    Liver Enzymes  Recent Labs Lab 10/01/16 2112 10/02/16 0955  AST 18 33  ALT 14* 20  ALKPHOS 62 70  BILITOT 0.4 2.6*  ALBUMIN 3.0* 3.0*    Cardiac Enzymes  Recent Labs Lab 10/02/16 0955 10/02/16 1515 10/03/16 0239  TROPONINI 0.04* 0.05* 0.05*    Glucose  Recent Labs Lab 10/02/16 0018 10/02/16 1932 10/02/16 2346 10/03/16 0447  GLUCAP 97 101* 108* 108*    Imaging Ct Head Wo Contrast  Result Date: 10/03/2016 CLINICAL DATA:  66 year old male with acute encephalopathy. EXAM: CT HEAD WITHOUT CONTRAST TECHNIQUE: Contiguous axial images were obtained from the base of the skull through the vertex without intravenous contrast. COMPARISON:  None. FINDINGS: Brain: The ventricles and sulci appropriate in size for patient's age. Mild periventricular and deep white matter chronic microvascular ischemic changes noted. A focal area of hypodensity in the right occipital subcortical white matter likely represent chronic microvascular ischemic changes. 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 There is no acute intracranial hemorrhage. No mass effect or midline shift noted. No extra-axial fluid collection. Vascular: No hyperdense vessel or unexpected  calcification. Skull: Normal. Negative for fracture or focal lesion. Sinuses/Orbits: No acute finding. Other: Partially visualized support tubes in the mouth. IMPRESSION: No acute intracranial hemorrhage. Mild chronic microvascular ischemic changes. If symptoms persist and there are no contraindications, MRI may provide better evaluation if clinically indicated. Electronically Signed   By: Anner Crete M.D.   On: 10/03/2016 01:28   Portable Chest Xray  Result Date: 10/02/2016 CLINICAL DATA:  Hypoxia EXAM: PORTABLE CHEST 1 VIEW  COMPARISON:  Chest CT October 01, 2016 FINDINGS: Endotracheal tube tip is 2.5 cm above the carina. Nasogastric tube tip and side port are below the diaphragm. No pneumothorax. There is patchy consolidation in the left lower lobe. Lungs elsewhere are clear. There is mild cardiomegaly with pulmonary venous hypertension. No evident adenopathy. No bone lesions. IMPRESSION: Tube positions as described without pneumothorax. Left lower lobe consolidation, likely pneumonia. There is cardiomegaly with pulmonary venous hypertension indicative of a degree of pulmonary vascular congestion. Electronically Signed   By: Lowella Grip III M.D.   On: 10/02/2016 17:30   Dg Abd Portable 1v  Result Date: 10/02/2016 CLINICAL DATA:  Nasogastric tube placement EXAM: PORTABLE ABDOMEN - 1 VIEW COMPARISON:  None. FINDINGS: Nasogastric tube tip and side port are in the stomach. There is no bowel dilatation or air-fluid level suggesting bowel obstruction. No free air. IMPRESSION: Nasogastric tube tip and side port in stomach. Bowel gas pattern unremarkable. Electronically Signed   By: Lowella Grip III M.D.   On: 10/02/2016 17:28     STUDIES:  EEG 1/23>> CT head 1/23>> No acute intracranial hemorrhage. Mild chronic microvascular ischemic changes. CT abd/pelvis 1/23>>Significant inflammatory changes within the left upper quadrant at the tail the pancreas and splenic flexure of the colon. Circumferential colon wall thickening, and small volume of free fluid. Cholelithiasis with circumferential fluid/gallbladder wall Thickening. CXR 1/23>>Left lower lobe consolidation   CULTURES: UCx 1/23>>>  ANTIBIOTICS: Zosyn 1/23>>>  SIGNIFICANT EVENTS: 1/22 admit, transfused 3 u pRBC 1/23 intubated, transfused 2 u pRBC  LINES/TUBES: ETT 1/23>>  DISCUSSION: 66 year old man presented with near syncope, dyspnea on exertion found to have symptomatic anemia. Source seems to be GI w/ findings of melena. He was transfused and was  hemodynamically stable but developed acute delirium and agitation requiring a total of 12mg  of ativan. This resulted in somnolence and acute hypoxic respiratory failure with episodes of apnea. He was transferred to the ICU.  ASSESSMENT / PLAN:  NEUROLOGIC A:   Acute encephalopathy: TSH and ammonia normal, CT head with no acute changes Near syncope  Possible Alcohol Withdrawal P:   RASS goal 0 to -1 Precedex gtt, fent gtt, fent prn, versed prn EEG Cont keppra Thiamine and folate supplementation   PULMONARY A: Apnea w/ acute hypoxic respiratory failure LLL consolidation P:   Wean vent as tolerated  CARDIOVASCULAR A:  Hypovolemia > seems to have improved.  Mild troponin I elevation > plateau'd at 0.05 P:  Cont IVFs  RENAL A:   AKI Lactic acidosis Non Anion Gap Metabolic Acidosis Elevated CK > 1886 on 1/23. UA negative. P:   LR@100   GASTROINTESTINAL A:   Melena  Possible colitis  P:   Cont PPI gtt NPO EGD when MS more appropriate   HEMATOLOGIC A:   Acute/subacute blood loss anemia  Microcytic/hypochromic anemia  S/p transfusion 3 units.  P:  F/u cbc tranfuse for hgb <7 Holding anticoagulation  SCDs  INFECTIOUS A:   R/o colitis  P:   Trend fever curve Trend wbc Zosyn 1/23>>  FAMILY  - Updates: pending   - Inter-disciplinary family meet or Palliative Care meeting due by:  1/30  Jacques Earthly, MD  Internal Medicine PGY-3 Pager # [phone] OR # [phone] if no answer 10/03/2016, 7:14 AM

## 2016-10-03 NOTE — Progress Notes (Signed)
EEG completed; results pending.    

## 2016-10-03 NOTE — Progress Notes (Signed)
RN called d/t pt desat to 85% on 30% fio2.  Per RN she has given several 100% o2 breaths x 2 minutes per vent, and also that pt was agitated and RN just sedated pt.    Pt suctioned x 2 for moderate secretions.  No distress noted, fio2 increased to 100%, sat improved to 94-95% on 100% fio2 currently.  Will notify unit  RT.

## 2016-10-03 NOTE — Progress Notes (Signed)
Initial Nutrition Assessment  DOCUMENTATION CODES:   Obesity unspecified  INTERVENTION:    If unable to extubate within next 24 hours, recommend initiate TF   Vital High Protein at goal rate of 40 ml/h (960 ml per day) and Prostat 60 ml TID would provide 1560 kcals, 174 gm protein, 803 ml free water daily.  NUTRITION DIAGNOSIS:   Inadequate oral intake related to inability to eat as evidenced by NPO status.  GOAL:   Provide needs based on ASPEN/SCCM guidelines  MONITOR:   Vent status  REASON FOR ASSESSMENT:   Ventilator    ASSESSMENT:   66 year old man who presented to the hospital on 1/22 with near syncope, dyspnea on exertion found to have symptomatic anemia. Source seems to be GI w/ findings of melena. He developed acute hypoxic respiratory failure requiring transfer to the ICU and intubation 1/23.  Nutrition-Focused physical exam completed. Findings are no fat depletion, no muscle depletion, and no edema.  Patient has required 5 units of PRBC since admission. Patient is currently intubated on ventilator support MV: 9.8 L/min Temp (24hrs), Avg:98.6 F (37 C), Min:97.7 F (36.5 C), Max:99.2 F (37.3 C)   Diet Order:  Diet NPO time specified  Skin:  Reviewed, no issues  Last BM:  1/22  Height:   Ht Readings from Last 1 Encounters:  10/02/16 6\' 2"  (1.88 m)    Weight:   Wt Readings from Last 1 Encounters:  10/03/16 238 lb 1.6 oz (108 kg)    Ideal Body Weight:  86.4 kg  BMI:  Body mass index is 30.57 kg/m.  Estimated Nutritional Needs:   Kcal:  1200-1500  Protein:  >/= 170 gm  Fluid:  2 L  EDUCATION NEEDS:   No education needs identified at this time  Molli Barrows, [place], [place], [place] Pager [phone] After Hours Pager [phone]

## 2016-10-03 NOTE — Progress Notes (Signed)
UNASSIGNED PATIENT Subjective: Patient was scheduled for an EGD today that got delayed as patient underwent an EEG, results of which are not available right now. His hemoglobin has been stable and he was intubated this afternoon as he was in severe respiratory distress. Her is on Precidex and is going to be started on a Versed drip now.  Objective: Vital signs in last 24 hours: Temp:  [97.7 F (36.5 C)-99.2 F (37.3 C)] 98 F (36.7 C) (01/24 1644) Pulse Rate:  [54-82] 59 (01/24 1515) Resp:  [13-25] 15 (01/24 1515) BP: (83-146)/(50-87) 114/63 (01/24 1515) SpO2:  [96 %-100 %] 100 % (01/24 1515) FiO2 (%):  [30 %-100 %] 80 % (01/24 1515) Weight:  [108 kg (238 lb 1.6 oz)] 108 kg (238 lb 1.6 oz) (01/24 0300) Last BM Date: 10/01/16  Intake/Output from previous day: 01/23 0701 - 01/24 0700 In: 4246.4 [I.V.:2728.9; Blood:1380; IV Piggyback:137.5] Out: 900 [Urine:900] Intake/Output this shift: Total I/O In: 1184.2 [I.V.:1184.2] Out: 290 [Urine:290]  General appearance: appears older than stated age, combative, no distress and pale Resp: clear to auscultation bilaterally Cardio: regular rate and rhythm, S1, S2 normal, no murmur, click, rub or gallop GI: soft, non-tender; bowel sounds normal; no masses,  no organomegaly Extremities: extremities normal, atraumatic, no cyanosis or edema  Lab Results:  Recent Labs  10/02/16 1900 10/03/16 0239 10/03/16 1505  WBC 11.6* 12.4* 13.2*  HGB 6.1* 8.1* 8.2*  HCT 19.7* 25.8* 26.4*  PLT 296 269 262   BMET  Recent Labs  10/02/16 0955 10/02/16 1515 10/03/16 0751  NA 137 137 140  K 4.0 4.0 4.2  CL 106 108 111  CO2 18* 20* 23  GLUCOSE 138* 89 92  BUN 19 21* 21*  CREATININE 1.41* 1.19 1.27*  CALCIUM 8.4* 8.3* 8.2*   LFT  Recent Labs  10/01/16 2112  10/03/16 0751  PROT 5.9*  < > 5.7*  ALBUMIN 3.0*  < > 2.7*  AST 18  < > 43*  ALT 14*  < > 32  ALKPHOS 62  < > 64  BILITOT 0.4  < > 1.7*  BILIDIR 0.1  --   --   IBILI 0.3  --   --    < > = values in this interval not displayed. PT/INR  Recent Labs  10/01/16 2000 10/03/16 0239  LABPROT 15.9* 16.7*  INR 1.26 1.34   Hepatitis Panel No results for input(s): HEPBSAG, HCVAB, HEPAIGM, HEPBIGM in the last 72 hours. C-Diff No results for input(s): CDIFFTOX in the last 72 hours. No results for input(s): CDIFFPCR in the last 72 hours. Fecal Lactopherrin No results for input(s): FECLLACTOFRN in the last 72 hours.  Studies/Results: Ct Head Wo Contrast  Result Date: 10/03/2016 CLINICAL DATA:  65 year old male with acute encephalopathy. EXAM: CT HEAD WITHOUT CONTRAST TECHNIQUE: Contiguous axial images were obtained from the base of the skull through the vertex without intravenous contrast. COMPARISON:  None. FINDINGS: Brain: The ventricles and sulci appropriate in size for patient's age. Mild periventricular and deep white matter chronic microvascular ischemic changes noted. A focal area of hypodensity in the right occipital subcortical white matter likely represent chronic microvascular ischemic changes. 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 There is no acute intracranial hemorrhage. No mass effect or midline shift noted. No extra-axial fluid collection. Vascular: No hyperdense vessel or unexpected calcification. Skull: Normal. Negative for fracture or focal lesion. Sinuses/Orbits: No acute finding. Other: Partially visualized support tubes in the mouth. IMPRESSION: No acute intracranial hemorrhage. Mild chronic microvascular  ischemic changes. If symptoms persist and there are no contraindications, MRI may provide better evaluation if clinically indicated. Electronically Signed   By: Anner Crete M.D.   On: 10/03/2016 01:28   Dg Chest Port 1 View  Result Date: 10/03/2016 CLINICAL DATA:  Hypoxia, intubated, smoker EXAM: PORTABLE CHEST 1 VIEW COMPARISON:  Portable exam 0901 hours compared to 10/02/2016 FINDINGS: Tip of endotracheal tube projects 3.3 cm above carina.  Enlargement of cardiac silhouette. Mediastinal contours and pulmonary vascularity normal. Persistent LEFT lower lobe infiltrate question pneumonia. Central peribronchial thickening with question minimal atelectasis or infiltrate at RIGHT base. Upper lungs clear. No definite pleural effusion or pneumothorax. IMPRESSION: Persistent LEFT lower lobe infiltrate with question minimal atelectasis versus infiltrate RIGHT base. Electronically Signed   By: Lavonia Dana M.D.   On: 10/03/2016 09:11   Portable Chest Xray  Result Date: 10/02/2016 CLINICAL DATA:  Hypoxia EXAM: PORTABLE CHEST 1 VIEW COMPARISON:  Chest CT October 01, 2016 FINDINGS: Endotracheal tube tip is 2.5 cm above the carina. Nasogastric tube tip and side port are below the diaphragm. No pneumothorax. There is patchy consolidation in the left lower lobe. Lungs elsewhere are clear. There is mild cardiomegaly with pulmonary venous hypertension. No evident adenopathy. No bone lesions. IMPRESSION: Tube positions as described without pneumothorax. Left lower lobe consolidation, likely pneumonia. There is cardiomegaly with pulmonary venous hypertension indicative of a degree of pulmonary vascular congestion. Electronically Signed   By: Lowella Grip III M.D.   On: 10/02/2016 17:30   Dg Abd Portable 1v  Result Date: 10/03/2016 CLINICAL DATA:  Assess orogastric tube placement. EXAM: PORTABLE ABDOMEN - 1 VIEW COMPARISON:  Abdominal radiograph of October 02, 2016 FINDINGS: The orogastric tube tip lies in the mid gastric body. The proximal port lies just below the GE junction. The observed bowel gas pattern is unremarkable. IMPRESSION: Positioning of the orogastric tube since at the proximal port is just below the expected location of the GE junction. Advancement by 5-10 cm is recommended to assure that the proximal port remains below the GE junction. Electronically Signed   By: David  Martinique M.D.   On: 10/03/2016 07:57   Dg Abd Portable 1v  Result Date:  10/02/2016 CLINICAL DATA:  Nasogastric tube placement EXAM: PORTABLE ABDOMEN - 1 VIEW COMPARISON:  None. FINDINGS: Nasogastric tube tip and side port are in the stomach. There is no bowel dilatation or air-fluid level suggesting bowel obstruction. No free air. IMPRESSION: Nasogastric tube tip and side port in stomach. Bowel gas pattern unremarkable. Electronically Signed   By: Lowella Grip III M.D.   On: 10/02/2016 17:28   Ct Angio Chest/abd/pel For Dissection W And/or Wo Contrast  Result Date: 10/01/2016 CLINICAL DATA:  67 year old male with left-sided flank pain, concerning for dissection. EXAM: CT ANGIOGRAPHY CHEST, ABDOMEN AND PELVIS TECHNIQUE: Multidetector CT imaging through the chest, abdomen and pelvis was performed using the standard protocol during bolus administration of intravenous contrast. Multiplanar reconstructed images including MIPs were obtained and reviewed to evaluate the vascular anatomy. CONTRAST:  100 cc Isovue 370 COMPARISON:  None. FINDINGS: CTA CHEST FINDINGS Cardiovascular: Heart: No cardiomegaly. No pericardial fluid/thickening. Calcifications of the left main, left anterior descending, circumflex coronary arteries. Aorta: Unremarkable course, caliber, contour of the thoracic aorta. No aneurysm or dissection flap. No periaortic fluid. Soft plaque of the aortic arch with minimal calcifications. Mixed atherosclerotic and soft plaque of the descending thoracic aorta. Pulmonary arteries: No central, lobar, segmental, or proximal subsegmental filling defects. Mediastinum/Nodes: Mediastinal lymph nodes are present,  none of which are enlarged by CT size criteria. Unremarkable appearance of the thoracic esophagus. Unremarkable appearance of the thoracic inlet and thyroid. Lungs/Pleura: No pneumothorax or pleural effusion. Centrilobular emphysema with minimal paraseptal emphysema at the lung apices. No confluent airspace disease. No significant bronchial wall thickening. No pneumothorax.  Musculoskeletal: No displaced fracture. Degenerative changes of the thoracic spine. Review of the MIP images confirms the above findings. CTA ABDOMEN AND PELVIS FINDINGS VASCULAR Aorta: Mixed calcified and soft plaque of the abdominal aorta. No aneurysm. No periaortic fluid. No dissection. Celiac: Typical branch pattern of the celiac artery, with left gastric, common hepatic, and splenic artery identified. SMA: No significant atherosclerotic changes of the superior mesenteric artery. Renals: Renal arteries are patent. Single left and single right renal artery. IMA: Inferior mesenteric artery is patent. Left colic artery patent. Superior rectal artery patent. Right lower extremity: Unremarkable course, caliber, and contour of the right iliac system. No aneurysm, dissection, or occlusion. Hypogastric artery is patent. Anterior and posterior division patent. Common femoral artery patent. Proximal SFA and profunda femoris patent. Left lower extremity: Unremarkable course, caliber, and contour of the left iliac system. No aneurysm, dissection, or occlusion. Hypogastric artery is patent. Anterior and posterior division patent. Common femoral artery patent. Proximal SFA and profunda femoris patent. Veins: Splenic vein appears attenuated posterior to the body of the pancreas, though not well evaluated secondary to the phase of the contrast. Portal vein not well evaluated by the phase of the contrast bolus. Review of the MIP images confirms the above findings. NON-VASCULAR Hepatobiliary: Unremarkable appearance of liver. The gallbladder demonstrates lumen full of non radiopaque stones. Pericholecystic fluid is present with questionable gallbladder wall thickening. No intrahepatic or extrahepatic biliary ductal dilatation. Pancreas: No inflammatory changes at the head of the pancreas. Body the pancreas relatively unremarkable. Coarse calcification the tail of the pancreas. Focal fluid at the tail the pancreas extending  towards the inferior pole of the spleen and adjacent to the splenic flexure, within the splenic colic ligament. No pancreatic ductal dilation. Spleen: Fluid and inflammatory changes adjacent to the inferior tip of the spleen. Adrenals/Urinary Tract: Unremarkable appearance of adrenal glands. No evidence of right-sided or left-sided hydronephrosis. Likely nonobstructive nephrolithiasis bilateral kidneys. Course the bilateral ureters unremarkable. Unremarkable appearance of the urinary bladder. Stomach/Bowel: Inflammatory changes adjacent to the splenic flexure of the colon. Circumferential wall thickening of the distal transverse colon and sigmoid colon. Small fat focus on the lateral sigmoid colon (image 170 series 6). Extensive colonic diverticula of the sigmoid colon, descending colon, and the distal transverse colon. Unremarkable stomach. Unremarkable small bowel with no abnormal distention. Normal appendix. Lymphatic: Lymph nodes within the preaortic and para-aortic nodal station. Mesenteric: Inflammatory changes within the left upper quadrant adjacent to the spleen. Inflammatory changes of the fat adjacent the gallbladder extending into the hilum of the liver surrounding portal venous structures. No free fluid. Reproductive: Unremarkable appearance of the pelvic organs. Other: No hernia. Musculoskeletal: No acute fracture identified. Multilevel degenerative changes of the lower lumbar spine worst at the L2-L3 level and L5-S1 level. Bilateral facet disease worst in the lower lumbar spine. Degenerative changes bilateral hips. Review of the MIP images confirms the above findings. IMPRESSION: Study is negative for findings of acute aortic syndrome. Significant inflammatory changes within the left upper quadrant at the tail the pancreas and splenic flexure of the colon. There is apparent circumferential colon wall thickening, and small volume of free fluid. Differential diagnosis includes distal pancreatitis,  diverticulitis/epiploic appendagitis, focal ischemia, or  potentially sequela of portal hypertension/sinistral portal hypertension. Considered much less likely would be occult colon cancer with perforation. Cholelithiasis with circumferential fluid/gallbladder wall thickening. If there is concern for acute cholecystitis, correlation with lab values and potentially HIDA scan may be considered. Inflammatory changes adjacent to the gallbladder extend towards the liver hilum surrounding portal venous structures, and there is small caliber splenic vein posterior to the pancreas. Portal vein not well evaluated given the absence of a delayed CT phase. If there is concern for portal vein abnormality/thrombus, repeat contrast-enhanced CT with 8 delayed portal venous phase may be considered. The above results were called by telephone at the time of interpretation on 10/01/2016 at 9:30 pm to Dr. Ovid Curd PAGE , who verbally acknowledged these results. Aortic atherosclerosis without aneurysm. Coarse calcification the tail of the pancreas, potentially representing chronic pancreatitis/prior pancreatitis. Centrilobular emphysema with minimal paraseptal emphysema. Coronary artery disease. Signed, Dulcy Fanny. Earleen Newport, DO [facility] Electronically Signed   By: Corrie Mckusick D.O.   On: 10/01/2016 21:34   Medications: I have reviewed the patient's current medications.  Assessment/Plan: Melena with severe anemia in the setting of MS-we will check the EEG results before doing an EGD as his hemoglobin is currently stable.  LOS: 2 days   Christine Schiefelbein 10/03/2016, 4:53 PM

## 2016-10-04 ENCOUNTER — Encounter (HOSPITAL_COMMUNITY)
Admission: EM | Disposition: A | Discharge status: Skilled Nursing Facility | Payer: Self-pay | Source: Home / Self Care | Attending: Pulmonary Disease

## 2016-10-04 ENCOUNTER — Inpatient Hospital Stay (HOSPITAL_COMMUNITY): Payer: Medicare Other

## 2016-10-04 ENCOUNTER — Encounter (HOSPITAL_COMMUNITY): Payer: Self-pay | Admitting: *Deleted

## 2016-10-04 HISTORY — PX: ESOPHAGOGASTRODUODENOSCOPY: SHX5428

## 2016-10-04 LAB — CBC
HCT: 26.6 % — ABNORMAL LOW (ref 39.0–52.0)
HEMATOCRIT: 25.9 % — AB (ref 39.0–52.0)
HEMOGLOBIN: 7.9 g/dL — AB (ref 13.0–17.0)
Hemoglobin: 7.8 g/dL — ABNORMAL LOW (ref 13.0–17.0)
MCH: 25.5 pg — ABNORMAL LOW (ref 26.0–34.0)
MCH: 25.4 pg — ABNORMAL LOW (ref 26.0–34.0)
MCHC: 30.1 g/dL (ref 30.0–36.0)
MCHC: 29.7 g/dL — AB (ref 30.0–36.0)
MCV: 84.6 fL (ref 78.0–100.0)
MCV: 85.5 fL (ref 78.0–100.0)
PLATELETS: 251 10*3/uL (ref 150–400)
Platelets: 224 10*3/uL (ref 150–400)
RBC: 3.06 MIL/uL — ABNORMAL LOW (ref 4.22–5.81)
RBC: 3.11 MIL/uL — AB (ref 4.22–5.81)
RDW: 19.2 % — AB (ref 11.5–15.5)
RDW: 19.1 % — ABNORMAL HIGH (ref 11.5–15.5)
WBC: 11.7 10*3/uL — ABNORMAL HIGH (ref 4.0–10.5)
WBC: 11.8 10*3/uL — ABNORMAL HIGH (ref 4.0–10.5)

## 2016-10-04 LAB — FOLATE RBC
FOLATE, HEMOLYSATE: 244.8 ng/mL
Folate, RBC: 927 ng/mL (ref >498)
HEMATOCRIT: 26.4 % — AB (ref 37.5–51.0)

## 2016-10-04 LAB — BASIC METABOLIC PANEL
Anion gap: 9 (ref 5–15)
BUN: 21 mg/dL — AB (ref 6–20)
CHLORIDE: 111 mmol/L (ref 101–111)
CO2: 21 mmol/L — ABNORMAL LOW (ref 22–32)
CREATININE: 1.26 mg/dL — AB (ref 0.61–1.24)
Calcium: 8.2 mg/dL — ABNORMAL LOW (ref 8.9–10.3)
GFR calc Af Amer: >60 mL/min (ref >60)
GFR calc non Af Amer: 58 mL/min — ABNORMAL LOW (ref >60)
Glucose, Bld: 84 mg/dL (ref 65–99)
Potassium: 4 mmol/L (ref 3.5–5.1)
SODIUM: 141 mmol/L (ref 135–145)

## 2016-10-04 LAB — MAGNESIUM: MAGNESIUM: 2.4 mg/dL (ref 1.7–2.4)

## 2016-10-04 LAB — PHOSPHORUS: Phosphorus: 3.8 mg/dL (ref 2.5–4.6)

## 2016-10-04 LAB — GLUCOSE, CAPILLARY
GLUCOSE-CAPILLARY: 100 mg/dL — AB (ref 65–99)
GLUCOSE-CAPILLARY: 75 mg/dL (ref 65–99)
GLUCOSE-CAPILLARY: 92 mg/dL (ref 65–99)
GLUCOSE-CAPILLARY: 96 mg/dL (ref 65–99)
Glucose-Capillary: 83 mg/dL (ref 65–99)

## 2016-10-04 LAB — PROCALCITONIN: Procalcitonin: 1.82 ng/mL

## 2016-10-04 SURGERY — EGD (ESOPHAGOGASTRODUODENOSCOPY)
Anesthesia: Moderate Sedation

## 2016-10-04 MED ORDER — PANTOPRAZOLE SODIUM 40 MG IV SOLR
40.0000 mg | Freq: Two times a day (BID) | INTRAVENOUS | Status: DC
  Administered 2016-10-04 – 2016-10-11 (×15): 40 mg via INTRAVENOUS
  Filled 2016-10-04 (×16): qty 40

## 2016-10-04 MED ORDER — DEXMEDETOMIDINE HCL IN NACL 200 MCG/50ML IV SOLN
0.4000 ug/kg/h | INTRAVENOUS | Status: DC
  Administered 2016-10-04 – 2016-10-06 (×15): 1.2 ug/kg/h via INTRAVENOUS
  Administered 2016-10-06: 0.8 ug/kg/h via INTRAVENOUS
  Administered 2016-10-06: 1.2 ug/kg/h via INTRAVENOUS
  Administered 2016-10-06: 0.8 ug/kg/h via INTRAVENOUS
  Administered 2016-10-07: 1.6 ug/kg/h via INTRAVENOUS
  Administered 2016-10-07: 1.2 ug/kg/h via INTRAVENOUS
  Administered 2016-10-07 (×2): 1.6 ug/kg/h via INTRAVENOUS
  Administered 2016-10-07 (×2): 1.8 ug/kg/h via INTRAVENOUS
  Administered 2016-10-07: 1 ug/kg/h via INTRAVENOUS
  Administered 2016-10-07: 1.2 ug/kg/h via INTRAVENOUS
  Administered 2016-10-07 – 2016-10-09 (×21): 1.8 ug/kg/h via INTRAVENOUS
  Administered 2016-10-09: 1.5 ug/kg/h via INTRAVENOUS
  Administered 2016-10-09 (×3): 1.8 ug/kg/h via INTRAVENOUS
  Administered 2016-10-09: 1.2 ug/kg/h via INTRAVENOUS
  Administered 2016-10-09 (×7): 1.8 ug/kg/h via INTRAVENOUS
  Administered 2016-10-09: 1.2 ug/kg/h via INTRAVENOUS
  Administered 2016-10-09 (×4): 1.8 ug/kg/h via INTRAVENOUS
  Administered 2016-10-10: 1.4 ug/kg/h via INTRAVENOUS
  Administered 2016-10-10: 1.8 ug/kg/h via INTRAVENOUS
  Administered 2016-10-10: 1.4 ug/kg/h via INTRAVENOUS
  Administered 2016-10-10: 0.5 ug/kg/h via INTRAVENOUS
  Administered 2016-10-10: 1.8 ug/kg/h via INTRAVENOUS
  Administered 2016-10-10: 1.6 ug/kg/h via INTRAVENOUS
  Administered 2016-10-10: 1.8 ug/kg/h via INTRAVENOUS
  Filled 2016-10-04: qty 150
  Filled 2016-10-04: qty 100
  Filled 2016-10-04: qty 200
  Filled 2016-10-04: qty 100
  Filled 2016-10-04: qty 50
  Filled 2016-10-04 (×10): qty 100
  Filled 2016-10-04: qty 200
  Filled 2016-10-04 (×4): qty 100
  Filled 2016-10-04: qty 50
  Filled 2016-10-04 (×8): qty 100
  Filled 2016-10-04: qty 150
  Filled 2016-10-04: qty 50
  Filled 2016-10-04 (×2): qty 100
  Filled 2016-10-04: qty 200
  Filled 2016-10-04 (×6): qty 100
  Filled 2016-10-04: qty 50
  Filled 2016-10-04: qty 150
  Filled 2016-10-04 (×4): qty 100
  Filled 2016-10-04 (×2): qty 200
  Filled 2016-10-04 (×2): qty 50
  Filled 2016-10-04 (×3): qty 100

## 2016-10-04 MED ORDER — SODIUM CHLORIDE 0.9 % IV BOLUS (SEPSIS)
500.0000 mL | Freq: Once | INTRAVENOUS | Status: AC
  Administered 2016-10-04: 500 mL via INTRAVENOUS

## 2016-10-04 MED ORDER — DEXMEDETOMIDINE HCL IN NACL 200 MCG/50ML IV SOLN
0.4000 ug/kg/h | INTRAVENOUS | Status: DC
  Administered 2016-10-04: 0.4 ug/kg/h via INTRAVENOUS
  Filled 2016-10-04: qty 50

## 2016-10-04 MED ORDER — SODIUM CHLORIDE 0.9 % IV SOLN
0.0000 ug/min | INTRAVENOUS | Status: DC
  Administered 2016-10-04: 20 ug/min via INTRAVENOUS
  Filled 2016-10-04 (×2): qty 1

## 2016-10-04 MED ORDER — SERTRALINE HCL 50 MG PO TABS
50.0000 mg | ORAL_TABLET | Freq: Every day | ORAL | Status: DC
  Administered 2016-10-05 – 2016-10-06 (×2): 50 mg
  Filled 2016-10-04 (×4): qty 1

## 2016-10-04 NOTE — Progress Notes (Signed)
PULMONARY / CRITICAL CARE MEDICINE   Name: Aubrey Powelson MRN: [id number] DOB: [birth year]    ADMISSION DATE:  10/01/2016 CONSULTATION DATE:  1/23  REFERRING MD:  Reesa Chew, MD ([facility])  CHIEF COMPLAINT:  Acute Encephalopathy and intermittent hypoxia   SUBJECTIVE:  Had agitation last night and Precedex reordered. Agitation this morning when stimulated.   VITAL SIGNS: BP (!) 86/55   Pulse (!) 51   Temp 98 F (36.7 C) (Oral)   Resp 14   Ht 6\' 2"  (1.88 m)   Wt 238 lb 1.6 oz (108 kg)   SpO2 100%   BMI 30.57 kg/m   HEMODYNAMICS:    VENTILATOR SETTINGS: Vent Mode: PRVC FiO2 (%):  [30 %-100 %] 40 % Set Rate:  [14 bmp] 14 bmp Vt Set:  [660 mL] 660 mL PEEP:  [5 cmH20] 5 cmH20 Plateau Pressure:  [13 cmH20-18 cmH20] 17 cmH20  INTAKE / OUTPUT: I/O last 3 completed shifts: In: 5708.8 [I.V.:4501.3; Blood:1045; IV Piggyback:162.5] Out: [id number] [Urine:1190]  PHYSICAL EXAMINATION: General appearance: NAD  Eyes: anicteric sclera, PERRL, EOMI bilaterally. Mouth:  membranes and no mucosal ulcerations; normal hard and soft palate, tongue and mouth is dry  Neck: Trachea midline; neck supple, no JVD Lungs/chest: No wheezes CV: RRR, no MRGs  Abdomen: Soft, non-tender; no masses or HSM Extremities: No peripheral edema or extremity lymphadenopathy Skin: Normal temperature, turgor and texture; no rash, ulcers or subcutaneous nodules, pale as mentioned above  Psych/neuro: wakens to tactile stimulation, agitated, tremulous, nonpurposeful movements   LABS:  BMET  Recent Labs Lab 10/02/16 1515 10/03/16 0751 10/04/16 0329  NA 137 140 141  K 4.0 4.2 4.0  CL 108 111 111  CO2 20* 23 21*  BUN 21* 21* 21*  CREATININE 1.19 1.27* 1.26*  GLUCOSE 89 92 84    Electrolytes  Recent Labs Lab 10/02/16 1515 10/03/16 0751 10/04/16 0329  CALCIUM 8.3* 8.2* 8.2*  MG  --   --  2.4  PHOS  --   --  3.8    CBC  Recent Labs Lab 10/03/16 1505 10/03/16 2238 10/04/16 0329  WBC 13.2* 12.3*  11.7*  HGB 8.2* 7.8* 7.8*  HCT 26.4* 25.5* 25.9*  PLT 262 246 251    Coag's  Recent Labs Lab 10/01/16 2000 10/03/16 0239  APTT  --  37*  INR 1.26 1.34    Sepsis Markers  Recent Labs Lab 10/01/16 2234 10/03/16 0751 10/04/16 0329  LATICACIDVEN 2.15* 0.9  --   PROCALCITON  --  3.18 1.82    ABG  Recent Labs Lab 10/02/16 1530 10/02/16 2113  PHART 7.440 7.361  PCO2ART 31.8* 40.3  PO2ART 72.6* 180.0*    Liver Enzymes  Recent Labs Lab 10/01/16 2112 10/02/16 0955 10/03/16 0751  AST 18 33 43*  ALT 14* 20 32  ALKPHOS 62 70 64  BILITOT 0.4 2.6* 1.7*  ALBUMIN 3.0* 3.0* 2.7*    Cardiac Enzymes  Recent Labs Lab 10/02/16 1515 10/03/16 0239 10/03/16 0751  TROPONINI 0.05* 0.05* 0.04*    Glucose  Recent Labs Lab 10/03/16 0710 10/03/16 1232 10/03/16 1649 10/03/16 1949 10/03/16 2331 10/04/16 0400  GLUCAP 105* 94 110* 96 90 83    Imaging Dg Chest Port 1 View  Result Date: 10/03/2016 CLINICAL DATA:  Hypoxia, intubated, smoker EXAM: PORTABLE CHEST 1 VIEW COMPARISON:  Portable exam 0901 hours compared to 10/02/2016 FINDINGS: Tip of endotracheal tube projects 3.3 cm above carina. Enlargement of cardiac silhouette. Mediastinal contours and pulmonary vascularity normal. Persistent LEFT lower lobe  infiltrate question pneumonia. Central peribronchial thickening with question minimal atelectasis or infiltrate at RIGHT base. Upper lungs clear. No definite pleural effusion or pneumothorax. IMPRESSION: Persistent LEFT lower lobe infiltrate with question minimal atelectasis versus infiltrate RIGHT base. Electronically Signed   By: Lavonia Dana M.D.   On: 10/03/2016 09:11     STUDIES:  EEG 1/23>> CT head 1/23>> No acute intracranial hemorrhage. Mild chronic microvascular ischemic changes. CT abd/pelvis 1/23>>Significant inflammatory changes within the left upper quadrant at the tail the pancreas and splenic flexure of the colon. Circumferential colon wall thickening,  and small volume of free fluid. Cholelithiasis with circumferential fluid/gallbladder wall Thickening. CXR 1/23>>Left lower lobe consolidation   CULTURES: UCx 1/23>>multiple species  ANTIBIOTICS: Zosyn 1/23>>  SIGNIFICANT EVENTS: 1/22 admit, transfused 3 u pRBC 1/23 intubated, transfused 2 u pRBC  LINES/TUBES: ETT 1/23>>  DISCUSSION: 66 year old man presented with near syncope, dyspnea on exertion found to have symptomatic anemia. Source seems to be GI w/ findings of melena. He was transfused and was hemodynamically stable but developed acute delirium and agitation requiring a total of 12mg  of ativan. This resulted in somnolence and acute hypoxic respiratory failure with episodes of apnea. He was transferred to the ICU.  ASSESSMENT / PLAN:  NEUROLOGIC A:   Acute encephalopathy: TSH and ammonia normal, CT head with no acute changes Near syncope  Possible Alcohol Withdrawal P:   RASS goal 0 to -1 fent gtt, fent prn, versed gtt, versed prn EEG pending Cont keppra Thiamine and folate supplementation   PULMONARY A: Apnea w/ acute hypoxic respiratory failure LLL consolidation P:   Wean vent as tolerated  CARDIOVASCULAR A:  Hypovolemia > seems to have improved.  Mild troponin I elevation > plateau'd at 0.05 P:  Cont IVFs  RENAL A:   AKI Lactic acidosis Non Anion Gap Metabolic Acidosis Elevated CK > 1886 on 1/23. UA negative. P:   LR@100   GASTROINTESTINAL A:   Melena  Possible colitis  P:   Cont PPI gtt NPO EGD today   HEMATOLOGIC A:   Acute/subacute blood loss anemia  Microcytic/hypochromic anemia  S/p transfusion 3 units.  P:  F/u cbc tranfuse for hgb <7 Holding anticoagulation  SCDs  INFECTIOUS A:   R/o colitis  P:   Zosyn 1/23>>    FAMILY  - Updates: pending   - Inter-disciplinary family meet or Palliative Care meeting due by:  1/30  Jacques Earthly, MD  Internal Medicine PGY-3 Pager # [phone] OR # [phone] if no  answer 10/04/2016, 7:00 AM

## 2016-10-04 NOTE — Procedures (Signed)
Central Venous Catheter Insertion Procedure Note Barry Mccoy [id number] 1951-02-26  Procedure: Insertion of Central Venous Catheter Indications: Drug and/or fluid administration  Procedure Details Consent: Risks of procedure as well as the alternatives and risks of each were explained to the (patient/caregiver).  Consent for procedure obtained. Time Out: Verified patient identification, verified procedure, site/side was marked, verified correct patient position, special equipment/implants available, medications/allergies/relevent history reviewed, required imaging and test results available.  Performed  Maximum sterile technique was used including antiseptics, cap, gloves, gown, hand hygiene, mask and sheet. Skin prep: Chlorhexidine; local anesthetic administered A antimicrobial bonded/coated triple lumen catheter was placed in the right internal jugular vein using the Seldinger technique.  Evaluation Blood flow good Complications: No apparent complications Patient did tolerate procedure well. Chest X-ray ordered to verify placement.  CXR: normal.  Jacques Earthly, MD  Internal Medicine PGY-3 10/04/2016, 1:24 PM

## 2016-10-04 NOTE — Procedures (Signed)
Electroencephalogram (EEG) Report  Date of study: 10/03/16  Requesting clinician: Salvadore Dom, NP  Reason for study: Evaluate for seizure  Brief clinical history: This is a 66 year old man admitted to the ICU after presented with symptomatic anemia. He has been delirious and agitated. EEG is being performed for further evaluation.  Medications:  Current Facility-Administered Medications:  .  0.9 %  sodium chloride infusion, , Intravenous, Continuous, Carol Ada, MD .  chlorhexidine gluconate (MEDLINE KIT) (PERIDEX) 0.12 % solution 15 mL, 15 mL, Mouth Rinse, BID, Corey Harold, NP, 15 mL at 10/04/16 0824 .  dexmedetomidine (PRECEDEX) 400 MCG/100ML (4 mcg/mL) infusion, 0.4-1.2 mcg/kg/hr, Intravenous, Titrated, Praveen Mannam, MD, Last Rate: 32.4 mL/hr at 10/04/16 1000, 1.2 mcg/kg/hr at 10/04/16 1000 .  fentaNYL (SUBLIMAZE) 2,500 mcg in sodium chloride 0.9 % 250 mL (10 mcg/mL) infusion, 25-400 mcg/hr, Intravenous, Continuous, Corey Harold, NP, Last Rate: 40 mL/hr at 10/04/16 1115, 400 mcg/hr at 10/04/16 1115 .  fentaNYL (SUBLIMAZE) bolus via infusion 25 mcg, 25 mcg, Intravenous, Q1H PRN, Corey Harold, NP, 25 mcg at 10/04/16 1002 .  fentaNYL (SUBLIMAZE) injection 50 mcg, 50 mcg, Intravenous, Once, Corey Harold, NP .  folic acid injection 1 mg, 1 mg, Intravenous, Daily, Praveen Mannam, MD, 1 mg at 10/04/16 1005 .  lactated ringers infusion, , Intravenous, Continuous, Milagros Loll, MD, Last Rate: 100 mL/hr at 10/04/16 1100 .  levETIRAcetam (KEPPRA) 1,500 mg in sodium chloride 0.9 % 100 mL IVPB, 1,500 mg, Intravenous, QHS, Rise Patience, MD, Last Rate: 460 mL/hr at 10/03/16 2118, 1,500 mg at 10/03/16 2118 .  MEDLINE mouth rinse, 15 mL, Mouth Rinse, QID, Corey Harold, NP, 15 mL at 10/04/16 1200 .  midazolam (VERSED) 50 mg in sodium chloride 0.9 % 50 mL (1 mg/mL) infusion, 1-10 mg/hr, Intravenous, Continuous, Milagros Loll, MD, Last Rate: 7 mL/hr at 10/04/16 1311, 7 mg/hr at  10/04/16 1311 .  midazolam (VERSED) injection 1 mg, 1 mg, Intravenous, Q15 min PRN, Velna Ochs, MD, 1 mg at 10/03/16 1451 .  midazolam (VERSED) injection 1 mg, 1 mg, Intravenous, Q2H PRN, Velna Ochs, MD .  nicotine (NICODERM CQ - dosed in mg/24 hours) patch 21 mg, 21 mg, Transdermal, Daily, Rhetta Mura Schorr, NP, 21 mg at 10/04/16 1004 .  pantoprazole (PROTONIX) injection 40 mg, 40 mg, Intravenous, Q12H, Praveen Mannam, MD, 40 mg at 10/04/16 1006 .  phenylephrine (NEO-SYNEPHRINE) 10 mg in sodium chloride 0.9 % 250 mL (0.04 mg/mL) infusion, 0-400 mcg/min, Intravenous, Titrated, Marshell Garfinkel, MD, Stopped at 10/04/16 1512 .  piperacillin-tazobactam (ZOSYN) IVPB 3.375 g, 3.375 g, Intravenous, Q8H, Milagros Loll, MD, 3.375 g at 10/04/16 1313 .  sertraline (ZOLOFT) tablet 50 mg, 50 mg, Per Tube, QHS, Milagros Loll, MD .  thiamine (B-1) injection 100 mg, 100 mg, Intravenous, Daily, Ankit Arsenio Loader, MD, 100 mg at 10/04/16 1005  Description: This is a routine EEG performed using standard international 10-20 electrode placement. A total of 18 channels are recorded, including one for the EKG. this study is performed in the ICU with the patient intubated. He is sedated with Precedex, fentanyl, and Versed.  Activating Maneuvers: None  Findings:  The EKG channel demonstrates a regular rhythm with a rate of 70 beats per minute.   The background consists of low voltage delta activity with frequencies average 2-2.5 Hz. This background is poorly reactive.  There are no focal asymmetries. No epileptiform discharges are present. No seizures are recorded.    Impression:  This is a markedly abnormal EEG due to severe diffuse generalized slowing.  Clinical correlation: This EEG is consistent with a global encephalopathic process but is nonspecific as to the etiology. This pattern can be seen in the setting of medication effect, metabolic derangements, neurodegenerative disorders, and acute  CNS processes including ischemia and infection. Clinical correlation is required, though this pattern would certainly be compatible with reported sedation with Precedex, fentanyl, and Versed. This nothing to suggest seizure on this recording.   Melba Coon, MD [facility]

## 2016-10-04 NOTE — Progress Notes (Signed)
Called to patient's bedside due to worsening agitation requiring 4 point restraints. Patient was well controlled on Precedex gtt and Fentanyl gtt last night. Precedex d/c'ed today for unclear reasons- I have looked through the notes and do see specific orders or explanation to d/c. Possibly due to HR versus BP. Discussed with Dr. Chase Caller, MD. We will add back Precedex gtt, but nursing will notify us if MAP drops greater than 5, SBP <90 or HR <50.   Barry Malay, DO PGY-3 Internal Medicine Resident Pager # [phone] 10/04/2016 1:17 AM

## 2016-10-04 NOTE — Op Note (Signed)
[facility] Patient Name: Barry Mccoy Procedure Date : 10/04/2016 MRN: [id number] Attending MD: Carol Ada , MD Date of Birth: 1950-12-16 CSN: [id number] Age: 66 Admit Type: Inpatient Procedure:                Upper GI endoscopy Indications:              Iron deficiency anemia, Melena Providers:                Carol Ada, MD, Elna Breslow, RN, Cherylynn Ridges,                            Technician Referring MD:              Medicines:                None Complications:            No immediate complications. Estimated Blood Loss:     Estimated blood loss: none. Procedure:                Pre-Anesthesia Assessment:                           - Prior to the procedure, a History and Physical                            was performed, and patient medications and                            allergies were reviewed. The patient's tolerance of                            previous anesthesia was also reviewed. The risks                            and benefits of the procedure and the sedation                            options and risks were discussed with the patient.                            All questions were answered, and informed consent                            was obtained. Prior Anticoagulants: The patient has                            taken no previous anticoagulant or antiplatelet                            agents. ASA Grade Assessment: IV - A patient with                            severe systemic disease that is a constant threat  to life. After reviewing the risks and benefits,                            the patient was deemed in satisfactory condition to                            undergo the procedure.                           After obtaining informed consent, the endoscope was                            passed under direct vision. Throughout the                            procedure, the patient's blood pressure, pulse, and                   oxygen saturations were monitored continuously. The                            [id number]) scope was introduced through the                            mouth, and advanced to the second part of duodenum.                            The upper GI endoscopy was accomplished without                            difficulty. The patient tolerated the procedure                            well. Scope In: Scope Out: Findings:      The esophagus was normal.      Severe portal hypertensive gastropathy versus an inflammatory gastritis       was found in the entire examined stomach.      The examined duodenum was normal.      The current findings cannot explain the magnitude of his anemia,       however, his colon exhibits inflammaton on the CT scan. Impression:               - Normal esophagus.                           - Portal hypertensive gastropathy versus                            inflammatory gastritis.                           - Normal examined duodenum.                           - No specimens collected. Moderate Sedation:      None Recommendation:           -  Return patient to ICU for ongoing care.                           - NPO.                           - Continue present medications.                           - If patient improves clinically, a colonoscopy is                            warranted to follow up on the CT scan findings. Procedure Code(s):        --- Professional ---                           [phone], Esophagogastroduodenoscopy, flexible,                            transoral; diagnostic, including collection of                            specimen(s) by brushing or washing, when performed                            (separate procedure) Diagnosis Code(s):        --- Professional ---                           K76.6, Portal hypertension                           K31.89, Other diseases of stomach and duodenum                           D50.9, Iron  deficiency anemia, unspecified                           K92.1, Melena (includes Hematochezia) CPT copyright 2016 [employer]. All rights reserved. The codes documented in this report are preliminary and upon coder review may  be revised to meet current compliance requirements. Carol Ada, MD Carol Ada, MD 10/04/2016 5:14:24 PM This report has been signed electronically. Number of Addenda: 0

## 2016-10-04 NOTE — Care Management Note (Signed)
Case Management Note  Patient Details  Name: Barry Mccoy MRN: [id number] Date of Birth: 08/19/1951  Subjective/Objective:     Pt admitted with GIB               Action/Plan:  PTA from home alone - recent widow.  Daughter planning on taking dad back home with her to live at discharge.  CSW consulted - daughter requested "greiving and depression"  resources in [place].  CM will continue to follow for discharge needs   Expected Discharge Date:  10/04/16               Expected Discharge Plan:     In-House Referral:  Clinical Social Work  Discharge planning Services  CM Consult  Post Acute Care Choice:    Choice offered to:     DME Arranged:    DME Agency:     HH Arranged:    HH Agency:     Status of Service:  In process, will continue to follow  If discussed at Long Length of Stay Meetings, dates discussed:    Additional Comments:  Maryclare Labrador, RN 10/04/2016, 3:20 PM

## 2016-10-05 ENCOUNTER — Inpatient Hospital Stay (HOSPITAL_COMMUNITY): Payer: Medicare Other

## 2016-10-05 ENCOUNTER — Encounter (HOSPITAL_COMMUNITY): Payer: Self-pay | Admitting: Gastroenterology

## 2016-10-05 LAB — PROCALCITONIN: Procalcitonin: 0.71 ng/mL

## 2016-10-05 LAB — GLUCOSE, CAPILLARY
GLUCOSE-CAPILLARY: 106 mg/dL — AB (ref 65–99)
GLUCOSE-CAPILLARY: 116 mg/dL — AB (ref 65–99)
Glucose-Capillary: 96 mg/dL (ref 65–99)
Glucose-Capillary: 106 mg/dL — ABNORMAL HIGH (ref 65–99)
Glucose-Capillary: 116 mg/dL — ABNORMAL HIGH (ref 65–99)

## 2016-10-05 LAB — CBC
HCT: 27.1 % — ABNORMAL LOW (ref 39.0–52.0)
Hemoglobin: 8 g/dL — ABNORMAL LOW (ref 13.0–17.0)
MCH: 25.3 pg — ABNORMAL LOW (ref 26.0–34.0)
MCHC: 29.5 g/dL — ABNORMAL LOW (ref 30.0–36.0)
MCV: 85.8 fL (ref 78.0–100.0)
PLATELETS: 230 10*3/uL (ref 150–400)
RBC: 3.16 MIL/uL — AB (ref 4.22–5.81)
RDW: 19.5 % — ABNORMAL HIGH (ref 11.5–15.5)
WBC: 9 10*3/uL (ref 4.0–10.5)

## 2016-10-05 LAB — COMPREHENSIVE METABOLIC PANEL
ALT: 35 U/L (ref 17–63)
AST: 37 U/L (ref 15–41)
Albumin: 2.3 g/dL — ABNORMAL LOW (ref 3.5–5.0)
Alkaline Phosphatase: 55 U/L (ref 38–126)
Anion gap: 7 (ref 5–15)
BUN: 18 mg/dL (ref 6–20)
CHLORIDE: 114 mmol/L — AB (ref 101–111)
CO2: 21 mmol/L — AB (ref 22–32)
CREATININE: 1.15 mg/dL (ref 0.61–1.24)
Calcium: 8 mg/dL — ABNORMAL LOW (ref 8.9–10.3)
GFR calc Af Amer: >60 mL/min (ref >60)
GLUCOSE: 103 mg/dL — AB (ref 65–99)
Potassium: 3.9 mmol/L (ref 3.5–5.1)
SODIUM: 142 mmol/L (ref 135–145)
Total Bilirubin: 1.2 mg/dL (ref 0.3–1.2)
Total Protein: 5.3 g/dL — ABNORMAL LOW (ref 6.5–8.1)

## 2016-10-05 MED ORDER — DOCUSATE SODIUM 50 MG/5ML PO LIQD
100.0000 mg | Freq: Two times a day (BID) | ORAL | Status: DC
  Administered 2016-10-05 (×2): 100 mg via ORAL
  Filled 2016-10-05 (×7): qty 10

## 2016-10-05 MED ORDER — PRO-STAT SUGAR FREE PO LIQD
60.0000 mL | Freq: Three times a day (TID) | ORAL | Status: DC
  Administered 2016-10-05 – 2016-10-10 (×12): 60 mL
  Filled 2016-10-05 (×20): qty 60

## 2016-10-05 MED ORDER — FUROSEMIDE 10 MG/ML IJ SOLN
40.0000 mg | Freq: Once | INTRAMUSCULAR | Status: AC
  Administered 2016-10-05: 40 mg via INTRAVENOUS
  Filled 2016-10-05: qty 4

## 2016-10-05 MED ORDER — VITAL HIGH PROTEIN PO LIQD
1000.0000 mL | ORAL | Status: DC
  Administered 2016-10-05 – 2016-10-06 (×2): 1000 mL
  Administered 2016-10-08 (×2)
  Administered 2016-10-08: 1000 mL
  Administered 2016-10-08 (×2)
  Administered 2016-10-09 – 2016-10-10 (×2): 1000 mL

## 2016-10-05 MED ORDER — BACLOFEN 1 MG/ML ORAL SUSPENSION
40.0000 mg | Freq: Every day | ORAL | Status: DC
  Administered 2016-10-05 – 2016-10-06 (×2): 40 mg
  Filled 2016-10-05 (×3): qty 4

## 2016-10-05 MED ORDER — QUETIAPINE FUMARATE 50 MG PO TABS
50.0000 mg | ORAL_TABLET | Freq: Two times a day (BID) | ORAL | Status: DC
  Administered 2016-10-05 – 2016-10-06 (×2): 50 mg
  Filled 2016-10-05 (×2): qty 1

## 2016-10-05 MED ORDER — SENNOSIDES 8.8 MG/5ML PO SYRP
5.0000 mL | ORAL_SOLUTION | Freq: Every day | ORAL | Status: DC
  Administered 2016-10-05: 5 mL via ORAL
  Filled 2016-10-05 (×3): qty 5

## 2016-10-05 NOTE — Progress Notes (Signed)
PULMONARY / CRITICAL CARE MEDICINE   Name: Barry Mccoy MRN: [id number] DOB: 1951/05/11    ADMISSION DATE:  10/01/2016 CONSULTATION DATE:  1/23  REFERRING MD:  Reesa Chew, MD ([facility])  CHIEF COMPLAINT:  Acute Encephalopathy and intermittent hypoxia   SUBJECTIVE:  Underwent EGD yesterday. No acute events overnight. Still has agitation but improved from yesterday.   VITAL SIGNS: BP (!) 123/59   Pulse (!) 52   Temp 97.9 F (36.6 C) (Oral)   Resp 14   Ht 6\' 2"  (1.88 m)   Wt 238 lb 1.6 oz (108 kg)   SpO2 100%   BMI 30.57 kg/m   VENTILATOR SETTINGS: Vent Mode: PRVC FiO2 (%):  [40 %] 40 % Set Rate:  [14 bmp] 14 bmp Vt Set:  [660 mL] 660 mL PEEP:  [5 cmH20] 5 cmH20 Plateau Pressure:  [15 cmH20-18 cmH20] 18 cmH20  INTAKE / OUTPUT: I/O last 3 completed shifts: In: 6504.3 [I.V.:5909.3; IV Piggyback:595] Out: [id number] [Urine:1620]  PHYSICAL EXAMINATION: General appearance: NAD  Eyes: anicteric sclera, PERRL, EOMI bilaterally. Mouth:  membranes and no mucosal ulcerations; normal hard and soft palate, tongue and mouth is dry  Neck: Trachea midline; neck supple, no JVD Lungs/chest: No wheezes CV: RRR, no MRGs  Abdomen: Soft, non-tender; no masses or HSM Extremities: No peripheral edema or extremity lymphadenopathy Skin: Normal temperature, turgor and texture; no rash, ulcers or subcutaneous nodules, pale as mentioned above  Psych/neuro: awakens to tactile stimulation, agitated, tremulous, nonpurposeful movements   LABS:  BMET  Recent Labs Lab 10/03/16 0751 10/04/16 0329 10/05/16 0214  NA 140 141 142  K 4.2 4.0 3.9  CL 111 111 114*  CO2 23 21* 21*  BUN 21* 21* 18  CREATININE 1.27* 1.26* 1.15  GLUCOSE 92 84 103*    Electrolytes  Recent Labs Lab 10/03/16 0751 10/04/16 0329 10/05/16 0214  CALCIUM 8.2* 8.2* 8.0*  MG  --  2.4  --   PHOS  --  3.8  --     CBC  Recent Labs Lab 10/04/16 0329 10/04/16 1536 10/05/16 0214  WBC 11.7* 11.8* 9.0  HGB 7.8* 7.9* 8.0*   HCT 25.9* 26.6* 27.1*  PLT 251 224 230    Coag's  Recent Labs Lab 10/01/16 2000 10/03/16 0239  APTT  --  37*  INR 1.26 1.34    Sepsis Markers  Recent Labs Lab 10/01/16 2234 10/03/16 0751 10/04/16 0329 10/05/16 0214  LATICACIDVEN 2.15* 0.9  --   --   PROCALCITON  --  3.18 1.82 0.71    ABG  Recent Labs Lab 10/02/16 1530 10/02/16 2113  PHART 7.440 7.361  PCO2ART 31.8* 40.3  PO2ART 72.6* 180.0*    Liver Enzymes  Recent Labs Lab 10/02/16 0955 10/03/16 0751 10/05/16 0214  AST 33 43* 37  ALT 20 32 35  ALKPHOS 70 64 55  BILITOT 2.6* 1.7* 1.2  ALBUMIN 3.0* 2.7* 2.3*    Cardiac Enzymes  Recent Labs Lab 10/02/16 1515 10/03/16 0239 10/03/16 0751  TROPONINI 0.05* 0.05* 0.04*    Glucose  Recent Labs Lab 10/04/16 0400 10/04/16 0841 10/04/16 1512 10/04/16 1922 10/04/16 2349 10/05/16 0355  GLUCAP 83 75 92 100* 96 96    Imaging Dg Chest Port 1 View  Result Date: 10/04/2016 CLINICAL DATA:  Post central line placement EXAM: PORTABLE CHEST 1 VIEW COMPARISON:  Portable exam 1232 hours compared to 10/07/2016 at 0524 hours FINDINGS: Tip of endotracheal tube projects 7.0 cm above carina. New RIGHT jugular central venous catheter with tip  projecting over SVC. Stable heart size and mediastinal contours. Bronchitic changes and accentuated perihilar markings stable. Bibasilar atelectasis. LEFT lateral costophrenic angle excluded. No new infiltrate or pneumothorax. Bones demineralized. IMPRESSION: No pneumothorax following RIGHT jugular line placement. Bronchitic changes with bibasilar atelectasis. Electronically Signed   By: Lavonia Dana M.D.   On: 10/04/2016 12:48   Dg Chest Port 1v Same Day  Result Date: 10/04/2016 CLINICAL DATA:  Evaluate ET tube placement. EXAM: PORTABLE CHEST 1 VIEW COMPARISON:  Chest radiograph earlier same day. FINDINGS: ET tube terminates in the mid to distal trachea. Monitoring leads overlie the patient. Right IJ central venous catheter  tip projects over the superior vena cava. Stable cardiac and mediastinal contours, enlarged. Grossly unchanged mid lower lung airspace opacities. Probable small bilateral layering pleural effusions. IMPRESSION: ET tube terminates in the mid to distal trachea. Electronically Signed   By: Lovey Newcomer M.D.   On: 10/04/2016 20:24   Dg Chest Port 1v Same Day  Result Date: 10/04/2016 CLINICAL DATA:  Patient status post intubation. EXAM: PORTABLE CHEST 1 VIEW COMPARISON:  Chest radiograph 10/04/2016. FINDINGS: Right IJ central venous catheter tip projects over the superior vena cava. The ET tube is visualized to the distal trachea. The distal aspect of the ET tube is not well demonstrated. Monitoring leads overlie the patient. Stable enlarged cardiac and mediastinal contours. Grossly unchanged bilateral mid and lower lung, right-greater-than-left heterogeneous opacities. Possible small layering bilateral pleural effusions. IMPRESSION: ET tube is visualized to the distal trachea. Given technique, the distal aspect of the ET tube is not demonstrated. Recommend repeat evaluation. Re- demonstrated bilateral mid and lower lung heterogeneous opacities. These results will be called to the ordering clinician or representative by the Radiologist Assistant, and communication documented in the PACS or zVision Dashboard. Electronically Signed   By: Lovey Newcomer M.D.   On: 10/04/2016 18:53     STUDIES:  CT head 1/23>> No acute intracranial hemorrhage. Mild chronic microvascular ischemic changes. CT abd/pelvis 1/23>>Significant inflammatory changes within the left upper quadrant at the tail the pancreas and splenic flexure of the colon. Circumferential colon wall thickening, and small volume of free fluid. Cholelithiasis with circumferential fluid/gallbladder wall Thickening. CXR 1/23>> Left lower lobe consolidation  EGD 1/25>> Portal hypertensive gastropathy versus inflammatory gastritis. EEG 1/25>> severe diffuse  generalized slowing, no seizures  CULTURES: UCx 1/23>>multiple species  ANTIBIOTICS: Zosyn 1/23>>  SIGNIFICANT EVENTS: 1/22 admit, transfused 3 u pRBC 1/23 intubated, transfused 2 u pRBC 1/25 underwent EGD  LINES/TUBES: ETT 1/23>> RIJ CVC 1/25>>  DISCUSSION: 66 year old man presented with near syncope, dyspnea on exertion found to have symptomatic anemia. Source seems to be GI w/ findings of melena. He was transfused and was hemodynamically stable but developed acute delirium and agitation requiring a total of 12mg  of ativan. This resulted in somnolence and acute hypoxic respiratory failure with episodes of apnea. He was transferred to the ICU.  ASSESSMENT / PLAN:  NEUROLOGIC/PSYCH A:   Acute encephalopathy: TSH and ammonia normal, CT head with no acute changes, EEG with no seizures Near syncope  Possible Alcohol Withdrawal Anxiety P:   RASS goal 0 to -1 fent gtt, fent prn, transitioning versed gtt to precedex gtt, versed prn Cont keppra (home migraine medication) Thiamine and folate supplementation \ Restart sertraline when gastric tube placed or tolerating PO  PULMONARY A: Apnea w/ acute hypoxic respiratory failure LLL consolidation P:   Wean vent as tolerated Abx as below  CARDIOVASCULAR A:  Hypovolemia > Resolved  Mild troponin I elevation >  peaked at 0.05, downtrended Hypervolemia P:  D/C LR Lasix 40mg  IV x 1.  RENAL A:   AKI > Resolved Lactic acidosis > Resolved Non Anion Gap Metabolic Acidosis Elevated CK > 1886 on 1/23. UA negative. P:   D/c LR  GASTROINTESTINAL A:   Portal hypertensive gastropathy versus inflammatory gastritis on EGD 1/25 Possible colitis  P:   Cont PPI BID NPO Will need colonoscopy to follow up on the CT scan findings eventually  HEMATOLOGIC A:   Acute/subacute blood loss anemia  Microcytic/hypochromic anemia  S/p transfusion 5 units.  P:  F/u cbc tranfuse for hgb <7 Holding anticoagulation   SCDs  INFECTIOUS A:   R/o colitis, possible aspiration PNA >> PCT downtrending P:   Zosyn 1/23>>    FAMILY  - Updates: Updated daughter and granddaughter at bedside on 1/25  - Inter-disciplinary family meet or Palliative Care meeting due by:  1/30  Jacques Earthly, MD  Internal Medicine PGY-3 Pager # [phone] OR # [phone] if no answer 10/05/2016, 7:02 AM

## 2016-10-05 NOTE — Progress Notes (Signed)
Nutrition Follow-up / Consult  DOCUMENTATION CODES:   Obesity unspecified  INTERVENTION:    Vital High Protein at goal rate of 40 ml/h (960 ml per day) and Prostat 60 ml TID to provide 1560 kcals, 174 gm protein, 803 ml free water daily.  NUTRITION DIAGNOSIS:   Inadequate oral intake related to inability to eat as evidenced by NPO status.  Ongoing  GOAL:   Provide needs based on ASPEN/SCCM guidelines   Progressing  MONITOR:   Vent status, TF tolerance, I & O's, Labs  REASON FOR ASSESSMENT:   Rounds (RD to order TF)    ASSESSMENT:   66 year old man who presented to the hospital on 1/22 with near syncope, dyspnea on exertion found to have symptomatic anemia. Source seems to be GI w/ findings of melena. He developed acute hypoxic respiratory failure requiring transfer to the ICU and intubation 1/23.  Discussed patient in ICU rounds and with RN today. EGD on 1/25 showed gastritis, portal gastropathy, no active bleed. RD to order TF.  Cortrak placed this morning. Tip is in the descending duodenum. Patient is currently intubated on ventilator support Temp (24hrs), Avg:98 F (36.7 C), Min:97.6 F (36.4 C), Max:98.4 F (36.9 C)  Labs reviewed. Medications reviewed and include Colace, thiamine, folic acid.  Diet Order:  Diet NPO time specified  Skin:  Reviewed, no issues  Last BM:  1/22  Height:   Ht Readings from Last 1 Encounters:  10/02/16 6\' 2"  (1.88 m)    Weight:   Wt Readings from Last 1 Encounters:  10/03/16 238 lb 1.6 oz (108 kg)   Ideal Body Weight:  86.4 kg  BMI:  Body mass index is 30.57 kg/m.  Estimated Nutritional Needs:   Kcal:  1200-1500  Protein:  >/= 170 gm  Fluid:  2 L  EDUCATION NEEDS:   No education needs identified at this time  Molli Barrows, [place], [place], [place] Pager [phone] After Hours Pager [phone]

## 2016-10-05 NOTE — Progress Notes (Signed)
Discussed with daughter, Lorenda Hatchet, results of EGD and EEG. Explained to her the inflammatory gastritis versus portal hypertensive gastropathy. Also told her that there were no seizures on the EEG. Explained to her main goals now is to reduce his agitation/sedating drips so that we can move towards extubation and it will be a day to day process. She had no other questions.  Jacques Earthly, MD  Internal Medicine PGY-3 Pager: [phone]

## 2016-10-06 ENCOUNTER — Inpatient Hospital Stay (HOSPITAL_COMMUNITY): Payer: Medicare Other

## 2016-10-06 LAB — BASIC METABOLIC PANEL
Anion gap: 8 (ref 5–15)
BUN: 13 mg/dL (ref 6–20)
CO2: 26 mmol/L (ref 22–32)
CREATININE: 0.99 mg/dL (ref 0.61–1.24)
Calcium: 8.2 mg/dL — ABNORMAL LOW (ref 8.9–10.3)
Chloride: 112 mmol/L — ABNORMAL HIGH (ref 101–111)
GFR calc Af Amer: >60 mL/min (ref >60)
GFR calc non Af Amer: >60 mL/min (ref >60)
GLUCOSE: 141 mg/dL — AB (ref 65–99)
Potassium: 3.4 mmol/L — ABNORMAL LOW (ref 3.5–5.1)
SODIUM: 146 mmol/L — AB (ref 135–145)

## 2016-10-06 LAB — CBC
HEMATOCRIT: 26.9 % — AB (ref 39.0–52.0)
Hemoglobin: 8 g/dL — ABNORMAL LOW (ref 13.0–17.0)
MCH: 25.5 pg — AB (ref 26.0–34.0)
MCHC: 29.7 g/dL — ABNORMAL LOW (ref 30.0–36.0)
MCV: 85.7 fL (ref 78.0–100.0)
PLATELETS: 190 10*3/uL (ref 150–400)
RBC: 3.14 MIL/uL — ABNORMAL LOW (ref 4.22–5.81)
RDW: 19.9 % — AB (ref 11.5–15.5)
WBC: 7.6 10*3/uL (ref 4.0–10.5)

## 2016-10-06 LAB — GLUCOSE, CAPILLARY
GLUCOSE-CAPILLARY: 137 mg/dL — AB (ref 65–99)
GLUCOSE-CAPILLARY: 141 mg/dL — AB (ref 65–99)
GLUCOSE-CAPILLARY: 141 mg/dL — AB (ref 65–99)
GLUCOSE-CAPILLARY: 146 mg/dL — AB (ref 65–99)
Glucose-Capillary: 133 mg/dL — ABNORMAL HIGH (ref 65–99)
Glucose-Capillary: 159 mg/dL — ABNORMAL HIGH (ref 65–99)

## 2016-10-06 LAB — MAGNESIUM: Magnesium: 1.9 mg/dL (ref 1.7–2.4)

## 2016-10-06 LAB — PHOSPHORUS: Phosphorus: 3.4 mg/dL (ref 2.5–4.6)

## 2016-10-06 MED ORDER — QUETIAPINE FUMARATE 100 MG PO TABS
100.0000 mg | ORAL_TABLET | Freq: Two times a day (BID) | ORAL | Status: DC
  Administered 2016-10-06 – 2016-10-07 (×2): 100 mg
  Filled 2016-10-06 (×2): qty 1

## 2016-10-06 MED ORDER — POTASSIUM CHLORIDE 20 MEQ/15ML (10%) PO SOLN
40.0000 meq | Freq: Once | ORAL | Status: AC
Start: 2016-10-06 — End: 2016-10-06
  Administered 2016-10-06: 40 meq
  Filled 2016-10-06: qty 30

## 2016-10-06 MED ORDER — QUETIAPINE FUMARATE 50 MG PO TABS
50.0000 mg | ORAL_TABLET | Freq: Once | ORAL | Status: AC
  Administered 2016-10-06: 50 mg via ORAL
  Filled 2016-10-06: qty 1

## 2016-10-06 NOTE — Progress Notes (Signed)
PULMONARY / CRITICAL CARE MEDICINE   Name: Barry Mccoy MRN: [id number] DOB: 1951-07-18    ADMISSION DATE:  10/01/2016 CONSULTATION DATE:  1/23  REFERRING MD:  Reesa Chew, MD ([facility])  CHIEF COMPLAINT:  Acute Encephalopathy and intermittent hypoxia   SUBJECTIVE:  No more episodes of GIB. Continues to be agitated, not doing well on weaning trials  VITAL SIGNS: BP (!) 128/53   Pulse 61   Temp 99.8 F (37.7 C) (Oral)   Resp 14   Ht 6\' 2"  (1.88 m)   Wt 239 lb 13.8 oz (108.8 kg)   SpO2 100%   BMI 30.80 kg/m   VENTILATOR SETTINGS: Vent Mode: PRVC FiO2 (%):  [40 %] 40 % Set Rate:  [14 bmp] 14 bmp Vt Set:  [660 mL] 660 mL PEEP:  [5 cmH20] 5 cmH20 Plateau Pressure:  [15 cmH20-18 cmH20] 15 cmH20  INTAKE / OUTPUT: I/O last 3 completed shifts: In: 4954.8 [I.V.:3859.8; NG/GT:680; IV Piggyback:415] Out: 4810 [Urine:4810]  PHYSICAL EXAMINATION: Blood pressure (!) 111/44, pulse 71, temperature 99.8 F (37.7 C), temperature source Oral, resp. rate 14, height 6\' 2"  (1.88 m), weight 239 lb 13.8 oz (108.8 kg), SpO2 98 %. Gen:      No acute distress HEENT:  EOMI, sclera anicteric Neck:     No masses; no thyromegaly Lungs:    Clear to auscultation bilaterally; normal respiratory effort CV:         Regular rate and rhythm; no murmurs Abd:      + bowel sounds; soft, non-tender; no palpable masses, no distension Ext:    No edema; adequate peripheral perfusion Skin:      Warm and dry; no rash Neuro: alert and oriented x 3 Psych: normal mood and affect  LABS:  BMET  Recent Labs Lab 10/04/16 0329 10/05/16 0214 10/06/16 0420  NA 141 142 146*  K 4.0 3.9 3.4*  CL 111 114* 112*  CO2 21* 21* 26  BUN 21* 18 13  CREATININE 1.26* 1.15 0.99  GLUCOSE 84 103* 141*    Electrolytes  Recent Labs Lab 10/04/16 0329 10/05/16 0214 10/06/16 0420  CALCIUM 8.2* 8.0* 8.2*  MG 2.4  --  1.9  PHOS 3.8  --  3.4    CBC  Recent Labs Lab 10/04/16 1536 10/05/16 0214 10/06/16 0420  WBC  11.8* 9.0 7.6  HGB 7.9* 8.0* 8.0*  HCT 26.6* 27.1* 26.9*  PLT 224 230 190    Coag's  Recent Labs Lab 10/01/16 2000 10/03/16 0239  APTT  --  37*  INR 1.26 1.34    Sepsis Markers  Recent Labs Lab 10/01/16 2234 10/03/16 0751 10/04/16 0329 10/05/16 0214  LATICACIDVEN 2.15* 0.9  --   --   PROCALCITON  --  3.18 1.82 0.71    ABG  Recent Labs Lab 10/02/16 1530 10/02/16 2113  PHART 7.440 7.361  PCO2ART 31.8* 40.3  PO2ART 72.6* 180.0*    Liver Enzymes  Recent Labs Lab 10/02/16 0955 10/03/16 0751 10/05/16 0214  AST 33 43* 37  ALT 20 32 35  ALKPHOS 70 64 55  BILITOT 2.6* 1.7* 1.2  ALBUMIN 3.0* 2.7* 2.3*    Cardiac Enzymes  Recent Labs Lab 10/02/16 1515 10/03/16 0239 10/03/16 0751  TROPONINI 0.05* 0.05* 0.04*    Glucose  Recent Labs Lab 10/05/16 1239 10/05/16 1527 10/05/16 2058 10/06/16 0027 10/06/16 0458 10/06/16 0900  GLUCAP 116* 106* 116* 141* 133* 141*    Imaging Dg Chest Port 1 View  Result Date: 10/06/2016 CLINICAL DATA:  Endotracheal tube EXAM: PORTABLE CHEST 1 VIEW COMPARISON:  10/05/2016 FINDINGS: Endotracheal tube 2 cm above the carina. Right jugular central venous catheter tip in the SVC. Feeding tube in place with the tip not visualized Bibasilar airspace disease similar to yesterday. Small right effusion. No pneumothorax IMPRESSION: Endotracheal tube 2 cm above the carina Bibasilar airspace disease unchanged from the prior study. Electronically Signed   By: Franchot Gallo M.D.   On: 10/06/2016 07:08   Dg Abd Portable 1v  Result Date: 10/05/2016 CLINICAL DATA:  Feeding tube placement EXAM: PORTABLE ABDOMEN - 1 VIEW COMPARISON:  10/03/2016 FINDINGS: Feeding tube tip is in the descending duodenum. IMPRESSION: Feeding tube tip in the descending duodenum. Electronically Signed   By: Rolm Baptise M.D.   On: 10/05/2016 11:30   STUDIES:  CT head 1/23>> No acute intracranial hemorrhage. Mild chronic microvascular ischemic changes. CT  abd/pelvis 1/23>>Significant inflammatory changes within the left upper quadrant at the tail the pancreas and splenic flexure of the colon. Circumferential colon wall thickening, and small volume of free fluid. Cholelithiasis with circumferential fluid/gallbladder wall Thickening. CXR 1/23>> Left lower lobe consolidation  EGD 1/25>> Portal hypertensive gastropathy versus inflammatory gastritis. EEG 1/25>> severe diffuse generalized slowing, no seizures  CULTURES: UCx 1/23>>multiple species  ANTIBIOTICS: Zosyn 1/23>>  SIGNIFICANT EVENTS: 1/22 admit, transfused 3 u pRBC 1/23 intubated, transfused 2 u pRBC 1/25 underwent EGD  LINES/TUBES: ETT 1/23>> RIJ CVC 1/25>>  DISCUSSION: 66 year old man presented with near syncope, dyspnea on exertion found to have symptomatic anemia. Source seems to be GI w/ findings of melena. He was transfused and was hemodynamically stable but developed acute delirium and agitation requiring a total of 12mg  of ativan. This resulted in somnolence and acute hypoxic respiratory failure with episodes of apnea. He was transferred to the ICU.  ASSESSMENT / PLAN:  NEUROLOGIC/PSYCH A:   Acute encephalopathy: TSH and ammonia normal, CT head with no acute changes, EEG with no seizures Near syncope  Possible Alcohol Withdrawal Anxiety P:   Continue sedation, wean as tolerated.  Fentanyl, precdex and versed gtt Continue keppra (home med) Thiamine, folate Started on seroquel, zoloft. Increase to 100 q12  PULMONARY A: Apnea w/ acute hypoxic respiratory failure LLL consolidation P:   Wean vent, mental status continues to be an barrier  CARDIOVASCULAR A:  Hypovolemia > Resolved  Mild troponin I elevation > peaked at 0.05, downtrended Hypervolemia P:  Repeat laxix x 1 40 mg  RENAL A:   AKI > Resolved Lactic acidosis > Resolved Non Anion Gap Metabolic Acidosis Elevated CK > 1886 on 1/23. UA negative. P:   Monitor urine output and  creatinine  GASTROINTESTINAL A:   Portal hypertensive gastropathy versus inflammatory gastritis on EGD 1/25 Possible colitis  P:   Continue PPI, tube feeds He will need colonoscopy to follow up on colitis seen on CT scan  HEMATOLOGIC A:   Acute/subacute blood loss anemia  Microcytic/hypochromic anemia  S/p transfusion 5 units.  P:  Monitor CBC  INFECTIOUS A:   R/o colitis, possible aspiration PNA >> PCT downtrending P:   Zosyn 1/23>> Continue antibiotics  FAMILY  - Updates: Updated daughter and granddaughter at bedside on 1/25. 1/26 - Inter-disciplinary family meet or Palliative Care meeting due by:  1/30  Critical care time- 36 mins.  Marshell Garfinkel MD [facility] Pager [phone] If no answer or after 3pm call: [phone] 10/06/2016, 11:32 AM

## 2016-10-07 ENCOUNTER — Inpatient Hospital Stay (HOSPITAL_COMMUNITY): Payer: Medicare Other

## 2016-10-07 LAB — GLUCOSE, CAPILLARY
GLUCOSE-CAPILLARY: 135 mg/dL — AB (ref 65–99)
GLUCOSE-CAPILLARY: 137 mg/dL — AB (ref 65–99)
GLUCOSE-CAPILLARY: 164 mg/dL — AB (ref 65–99)
GLUCOSE-CAPILLARY: 166 mg/dL — AB (ref 65–99)
Glucose-Capillary: 151 mg/dL — ABNORMAL HIGH (ref 65–99)
Glucose-Capillary: 152 mg/dL — ABNORMAL HIGH (ref 65–99)
Glucose-Capillary: 165 mg/dL — ABNORMAL HIGH (ref 65–99)

## 2016-10-07 LAB — BASIC METABOLIC PANEL
ANION GAP: 6 (ref 5–15)
BUN: 15 mg/dL (ref 6–20)
CO2: 30 mmol/L (ref 22–32)
Calcium: 8.5 mg/dL — ABNORMAL LOW (ref 8.9–10.3)
Chloride: 112 mmol/L — ABNORMAL HIGH (ref 101–111)
Creatinine, Ser: 0.93 mg/dL (ref 0.61–1.24)
GFR calc Af Amer: >60 mL/min (ref >60)
Glucose, Bld: 150 mg/dL — ABNORMAL HIGH (ref 65–99)
POTASSIUM: 3.7 mmol/L (ref 3.5–5.1)
SODIUM: 148 mmol/L — AB (ref 135–145)

## 2016-10-07 LAB — CBC
HCT: 26.6 % — ABNORMAL LOW (ref 39.0–52.0)
Hemoglobin: 7.8 g/dL — ABNORMAL LOW (ref 13.0–17.0)
MCH: 25.6 pg — ABNORMAL LOW (ref 26.0–34.0)
MCHC: 29.3 g/dL — ABNORMAL LOW (ref 30.0–36.0)
MCV: 87.2 fL (ref 78.0–100.0)
PLATELETS: 170 10*3/uL (ref 150–400)
RBC: 3.05 MIL/uL — AB (ref 4.22–5.81)
RDW: 19.8 % — ABNORMAL HIGH (ref 11.5–15.5)
WBC: 7.6 10*3/uL (ref 4.0–10.5)

## 2016-10-07 MED ORDER — FENTANYL CITRATE (PF) 100 MCG/2ML IJ SOLN
12.5000 ug | INTRAMUSCULAR | Status: DC | PRN
  Administered 2016-10-08 (×2): 25 ug via INTRAVENOUS
  Filled 2016-10-07 (×2): qty 2

## 2016-10-07 MED ORDER — DEXTROSE 5 % IV SOLN
INTRAVENOUS | Status: DC
  Administered 2016-10-07: 14:00:00 via INTRAVENOUS

## 2016-10-07 MED ORDER — HALOPERIDOL LACTATE 5 MG/ML IJ SOLN
2.0000 mg | Freq: Four times a day (QID) | INTRAMUSCULAR | Status: DC | PRN
  Administered 2016-10-07 – 2016-10-08 (×3): 2 mg via INTRAVENOUS
  Filled 2016-10-07 (×4): qty 1

## 2016-10-07 MED ORDER — FUROSEMIDE 10 MG/ML IJ SOLN
40.0000 mg | Freq: Once | INTRAMUSCULAR | Status: AC
  Administered 2016-10-07: 40 mg via INTRAVENOUS
  Filled 2016-10-07: qty 4

## 2016-10-07 MED ORDER — OLANZAPINE 10 MG PO TBDP
10.0000 mg | ORAL_TABLET | Freq: Two times a day (BID) | ORAL | Status: DC
  Administered 2016-10-07 – 2016-10-08 (×2): 10 mg via ORAL
  Filled 2016-10-07 (×3): qty 1

## 2016-10-07 NOTE — Progress Notes (Signed)
PULMONARY / CRITICAL CARE MEDICINE   Name: Barry Mccoy MRN: [id number] DOB: March 09, 1951    ADMISSION DATE:  10/01/2016 CONSULTATION DATE:  1/23  REFERRING MD:  Reesa Chew, MD ([facility])  CHIEF COMPLAINT:  Acute Encephalopathy and intermittent hypoxia   SUBJECTIVE:  Still having agitation issues. Extubated this morning.  VITAL SIGNS: BP 131/67   Pulse 68   Temp 99.7 F (37.6 C) (Oral)   Resp 14   Ht 6\' 2"  (1.88 m)   Wt 237 lb 14 oz (107.9 kg)   SpO2 96%   BMI 30.54 kg/m   VENTILATOR SETTINGS: Vent Mode: PRVC FiO2 (%):  [40 %] 40 % Set Rate:  [14 bmp] 14 bmp Vt Set:  [660 mL] 660 mL PEEP:  [5 cmH20] 5 cmH20 Plateau Pressure:  [15 cmH20-21 cmH20] 21 cmH20  INTAKE / OUTPUT: I/O last 3 completed shifts: In: 4574.7 [I.V.:2244.7; NG/GT:1850; IV Piggyback:480] Out: 2945 [Urine:2945]  PHYSICAL EXAMINATION: Blood pressure (!) 111/44, pulse 71, temperature 99.8 F (37.7 C), temperature source Oral, resp. rate 14, height 6\' 2"  (1.88 m), weight 239 lb 13.8 oz (108.8 kg), SpO2 98 %. Gen:      No acute distress HEENT:  EOMI, sclera anicteric Neck:     No masses; no thyromegaly Lungs:    Bilateral rhonchi CV:         Regular rate and rhythm; no murmurs Abd:      + bowel sounds; soft, non-tender; no palpable masses, no distension Ext:    No edema; adequate peripheral perfusion Skin:      Warm and dry; no rash Neuro: alert and oriented x 3  LABS:  BMET  Recent Labs Lab 10/05/16 0214 10/06/16 0420 10/07/16 0415  NA 142 146* 148*  K 3.9 3.4* 3.7  CL 114* 112* 112*  CO2 21* 26 30  BUN 18 13 15   CREATININE 1.15 0.99 0.93  GLUCOSE 103* 141* 150*    Electrolytes  Recent Labs Lab 10/04/16 0329 10/05/16 0214 10/06/16 0420 10/07/16 0415  CALCIUM 8.2* 8.0* 8.2* 8.5*  MG 2.4  --  1.9  --   PHOS 3.8  --  3.4  --     CBC  Recent Labs Lab 10/05/16 0214 10/06/16 0420 10/07/16 0415  WBC 9.0 7.6 7.6  HGB 8.0* 8.0* 7.8*  HCT 27.1* 26.9* 26.6*  PLT 230 190 170     Coag's  Recent Labs Lab 10/01/16 2000 10/03/16 0239  APTT  --  37*  INR 1.26 1.34    Sepsis Markers  Recent Labs Lab 10/01/16 2234 10/03/16 0751 10/04/16 0329 10/05/16 0214  LATICACIDVEN 2.15* 0.9  --   --   PROCALCITON  --  3.18 1.82 0.71    ABG  Recent Labs Lab 10/02/16 1530 10/02/16 2113  PHART 7.440 7.361  PCO2ART 31.8* 40.3  PO2ART 72.6* 180.0*    Liver Enzymes  Recent Labs Lab 10/02/16 0955 10/03/16 0751 10/05/16 0214  AST 33 43* 37  ALT 20 32 35  ALKPHOS 70 64 55  BILITOT 2.6* 1.7* 1.2  ALBUMIN 3.0* 2.7* 2.3*    Cardiac Enzymes  Recent Labs Lab 10/02/16 1515 10/03/16 0239 10/03/16 0751  TROPONINI 0.05* 0.05* 0.04*    Glucose  Recent Labs Lab 10/06/16 0900 10/06/16 1244 10/06/16 1649 10/06/16 1919 10/07/16 0048 10/07/16 0336  GLUCAP 141* 137* 159* 146* 135* 164*    Imaging No results found.  STUDIES:  CT head 1/23>> No acute intracranial hemorrhage. Mild chronic microvascular ischemic changes. CT abd/pelvis 1/23>>Significant  inflammatory changes within the left upper quadrant at the tail the pancreas and splenic flexure of the colon. Circumferential colon wall thickening, and small volume of free fluid. Cholelithiasis with circumferential fluid/gallbladder wall Thickening. CXR 1/23>> Left lower lobe consolidation  EGD 1/25>> Portal hypertensive gastropathy versus inflammatory gastritis. EEG 1/25>> severe diffuse generalized slowing, no seizures  CULTURES: UCx 1/23>>multiple species  ANTIBIOTICS: Zosyn 1/23>>  SIGNIFICANT EVENTS: 1/22 admit, transfused 3 u pRBC 1/23 intubated, transfused 2 u pRBC 1/25 underwent EEG and EGD  LINES/TUBES: ETT 1/23>> RIJ CVC 1/25>>  DISCUSSION: 66 year old man presented with near syncope, dyspnea on exertion found to have symptomatic anemia. Source seems to be GI w/ findings of melena. He was transfused and was hemodynamically stable but developed acute delirium and agitation  requiring a total of 12mg  of ativan. This resulted in somnolence and acute hypoxic respiratory failure with episodes of apnea. He was transferred to the ICU.  ASSESSMENT / PLAN:  NEUROLOGIC/PSYCH A:   Acute encephalopathy: TSH and ammonia normal, CT head with no acute changes, EEG with no seizures Near syncope  Possible Alcohol Withdrawal Anxiety P:   precedex gtt for RASS 0 Continue keppra (home med) Thiamine, folate Continue seroquel, zoloft  PULMONARY A: Apnea w/ acute hypoxic respiratory failure LLL consolidation P:   Supplemental O2 prn   CARDIOVASCULAR A:  Hypovolemia > Resolved  Mild troponin I elevation > peaked at 0.05, downtrended Hypervolemia P:  Repeat lasix x 1 40 mg  RENAL A:   AKI > Resolved Lactic acidosis > Resolved Non Anion Gap Metabolic Acidosis Elevated CK > 1886 on 1/23. UA negative. P:   Monitor urine output and creatinine  GASTROINTESTINAL A:   Portal hypertensive gastropathy versus inflammatory gastritis on EGD 1/25 Possible colitis  P:   Continue PPI, tube feeds He will need colonoscopy to follow up on colitis seen on CT scan  HEMATOLOGIC A:   Acute/subacute blood loss anemia  Microcytic/hypochromic anemia  S/p transfusion 5 units.  P:  Monitor CBC  INFECTIOUS A:   R/o colitis, possible aspiration PNA >> PCT downtrending P:   Zosyn 1/23>> Continue antibiotics  FAMILY  - Updates: Updated daughter and granddaughter at bedside on 1/25. 1/26 - Inter-disciplinary family meet or Palliative Care meeting due by:  1/30  Jacques Earthly, MD  Internal Medicine PGY-3 Pager: [phone] If no answer or after 3pm call: [phone] 10/07/2016, 7:08 AM

## 2016-10-07 NOTE — Progress Notes (Signed)
Daughter [name]) took patient home medications with her. Pharmacy has looked over and verified.

## 2016-10-07 NOTE — Progress Notes (Signed)
PT Cancellation Note  Patient Details Name: Blanton Proto MRN: [id number] DOB: 05-12-1951   Cancelled Treatment:    Reason Eval/Treat Not Completed: Patient not medically ready, spoke with nsg will hold PT today.   Duncan Dull 10/07/2016, 11:18 AM Alben Deeds, PT DPT  [phone]

## 2016-10-07 NOTE — Procedures (Signed)
Extubation Procedure Note  Patient Details:   Name: Revanth Trabucco DOB: 04-27-1951 MRN: [id number]   Airway Documentation:  Airway 8 mm (Active)  Secured at (cm) 28 cm 10/07/2016  8:51 AM  Measured From Lips 10/07/2016  8:51 AM  [place] 10/07/2016  8:51 AM  Secured By [employer] 10/07/2016  8:51 AM  Tube Holder Repositioned Yes 10/07/2016  8:51 AM  Cuff Pressure (cm H2O) 24 cm H2O 10/07/2016  5:56 AM  Site Condition Dry 10/07/2016  8:51 AM  Positive cuff leak. Pt extuabted to 4lpm [place], no distress noted at this time.  Evaluation  O2 sats: stable throughout Complications: No apparent complications Patient did tolerate procedure well. Bilateral Breath Sounds: Rhonchi   Yes  Chandon Lazcano Wyatt Haste 10/07/2016, 9:04 AM

## 2016-10-08 ENCOUNTER — Inpatient Hospital Stay (HOSPITAL_COMMUNITY): Payer: Medicare Other

## 2016-10-08 LAB — BASIC METABOLIC PANEL
Anion gap: 12 (ref 5–15)
Anion gap: 5 (ref 5–15)
BUN: 11 mg/dL (ref 6–20)
BUN: 13 mg/dL (ref 6–20)
CALCIUM: 8.5 mg/dL — AB (ref 8.9–10.3)
CALCIUM: 8.4 mg/dL — AB (ref 8.9–10.3)
CHLORIDE: 109 mmol/L (ref 101–111)
CO2: 28 mmol/L (ref 22–32)
CO2: 30 mmol/L (ref 22–32)
CREATININE: 0.84 mg/dL (ref 0.61–1.24)
CREATININE: 0.8 mg/dL (ref 0.61–1.24)
Chloride: 104 mmol/L (ref 101–111)
GFR calc Af Amer: >60 mL/min (ref >60)
GLUCOSE: 154 mg/dL — AB (ref 65–99)
Glucose, Bld: 130 mg/dL — ABNORMAL HIGH (ref 65–99)
Potassium: 2.8 mmol/L — ABNORMAL LOW (ref 3.5–5.1)
Potassium: 4.1 mmol/L (ref 3.5–5.1)
Sodium: 144 mmol/L (ref 135–145)
Sodium: 144 mmol/L (ref 135–145)

## 2016-10-08 LAB — GLUCOSE, CAPILLARY
GLUCOSE-CAPILLARY: 139 mg/dL — AB (ref 65–99)
GLUCOSE-CAPILLARY: 148 mg/dL — AB (ref 65–99)
GLUCOSE-CAPILLARY: 126 mg/dL — AB (ref 65–99)
Glucose-Capillary: 166 mg/dL — ABNORMAL HIGH (ref 65–99)
Glucose-Capillary: 155 mg/dL — ABNORMAL HIGH (ref 65–99)

## 2016-10-08 LAB — HEPATIC FUNCTION PANEL
ALBUMIN: 2.5 g/dL — AB (ref 3.5–5.0)
ALK PHOS: 56 U/L (ref 38–126)
ALT: 32 U/L (ref 17–63)
AST: 34 U/L (ref 15–41)
BILIRUBIN DIRECT: 0.3 mg/dL (ref 0.1–0.5)
BILIRUBIN TOTAL: 0.9 mg/dL (ref 0.3–1.2)
Indirect Bilirubin: 0.6 mg/dL (ref 0.3–0.9)
Total Protein: 5.5 g/dL — ABNORMAL LOW (ref 6.5–8.1)

## 2016-10-08 LAB — CBC
HCT: 26.3 % — ABNORMAL LOW (ref 39.0–52.0)
HEMOGLOBIN: 8 g/dL — AB (ref 13.0–17.0)
MCH: 25.8 pg — AB (ref 26.0–34.0)
MCHC: 30.4 g/dL (ref 30.0–36.0)
MCV: 84.8 fL (ref 78.0–100.0)
PLATELETS: 147 10*3/uL — AB (ref 150–400)
RBC: 3.1 MIL/uL — ABNORMAL LOW (ref 4.22–5.81)
RDW: 19.5 % — AB (ref 11.5–15.5)
WBC: 7 10*3/uL (ref 4.0–10.5)

## 2016-10-08 LAB — MAGNESIUM: Magnesium: 1.8 mg/dL (ref 1.7–2.4)

## 2016-10-08 LAB — AMMONIA: Ammonia: 18 umol/L (ref 9–35)

## 2016-10-08 LAB — PHOSPHORUS: PHOSPHORUS: 3 mg/dL (ref 2.5–4.6)

## 2016-10-08 MED ORDER — SODIUM CHLORIDE 0.9 % IV SOLN
30.0000 meq | Freq: Once | INTRAVENOUS | Status: AC
  Administered 2016-10-08: 30 meq via INTRAVENOUS
  Filled 2016-10-08: qty 15

## 2016-10-08 MED ORDER — SENNOSIDES 8.8 MG/5ML PO SYRP
5.0000 mL | ORAL_SOLUTION | Freq: Every evening | ORAL | Status: DC | PRN
  Filled 2016-10-08: qty 5

## 2016-10-08 MED ORDER — SODIUM CHLORIDE 0.9 % IV SOLN
30.0000 meq | Freq: Four times a day (QID) | INTRAVENOUS | Status: AC
  Administered 2016-10-08 (×2): 30 meq via INTRAVENOUS
  Filled 2016-10-08 (×3): qty 15

## 2016-10-08 MED ORDER — LORAZEPAM 2 MG/ML IJ SOLN: 1.0000 mg | INTRAMUSCULAR | Status: DC | PRN

## 2016-10-08 MED ORDER — HALOPERIDOL LACTATE 5 MG/ML IJ SOLN
2.0000 mg | Freq: Once | INTRAMUSCULAR | Status: AC
  Administered 2016-10-08: 2 mg via INTRAVENOUS

## 2016-10-08 MED ORDER — CLONAZEPAM 0.5 MG PO TBDP
0.5000 mg | ORAL_TABLET | Freq: Two times a day (BID) | ORAL | Status: DC
  Administered 2016-10-08 – 2016-10-09 (×3): 0.5 mg via ORAL
  Filled 2016-10-08 (×3): qty 1

## 2016-10-08 MED ORDER — FENTANYL CITRATE (PF) 100 MCG/2ML IJ SOLN
12.5000 ug | INTRAMUSCULAR | Status: DC | PRN
Start: 2016-10-08 — End: 2016-10-15
  Administered 2016-10-08 – 2016-10-15 (×7): 25 ug via INTRAVENOUS
  Filled 2016-10-08 (×7): qty 2

## 2016-10-08 MED ORDER — DOCUSATE SODIUM 50 MG/5ML PO LIQD: 100.0000 mg | Freq: Two times a day (BID) | ORAL | Status: DC | PRN

## 2016-10-08 MED ORDER — SERTRALINE HCL 50 MG PO TABS
50.0000 mg | ORAL_TABLET | Freq: Every day | ORAL | Status: DC
  Administered 2016-10-09: 50 mg
  Filled 2016-10-08: qty 1

## 2016-10-08 MED ORDER — HALOPERIDOL LACTATE 5 MG/ML IJ SOLN
5.0000 mg | Freq: Four times a day (QID) | INTRAMUSCULAR | Status: DC | PRN
  Administered 2016-10-08 – 2016-10-14 (×3): 5 mg via INTRAVENOUS
  Filled 2016-10-08 (×4): qty 1

## 2016-10-08 MED ORDER — OLANZAPINE 5 MG PO TBDP
10.0000 mg | ORAL_TABLET | Freq: Three times a day (TID) | ORAL | Status: DC
  Administered 2016-10-08 – 2016-10-09 (×3): 10 mg via ORAL
  Filled 2016-10-08 (×3): qty 1

## 2016-10-08 MED ORDER — MAGNESIUM SULFATE 2 GM/50ML IV SOLN
2.0000 g | Freq: Once | INTRAVENOUS | Status: AC
  Administered 2016-10-08: 2 g via INTRAVENOUS
  Filled 2016-10-08: qty 50

## 2016-10-08 MED ORDER — POTASSIUM CHLORIDE 2 MEQ/ML IV SOLN
INTRAVENOUS | Status: DC
  Administered 2016-10-08: 05:00:00 via INTRAVENOUS
  Filled 2016-10-08: qty 1000

## 2016-10-08 MED ORDER — NICOTINE 14 MG/24HR TD PT24
14.0000 mg | MEDICATED_PATCH | Freq: Every day | TRANSDERMAL | Status: DC
  Administered 2016-10-08 – 2016-10-22 (×15): 14 mg via TRANSDERMAL
  Filled 2016-10-08 (×15): qty 1

## 2016-10-08 MED ORDER — BACLOFEN 20 MG PO TABS
40.0000 mg | ORAL_TABLET | Freq: Every day | ORAL | Status: DC
  Administered 2016-10-08 – 2016-10-10 (×3): 40 mg
  Filled 2016-10-08 (×3): qty 2

## 2016-10-08 MED ORDER — LORAZEPAM 2 MG/ML IJ SOLN
2.0000 mg | INTRAMUSCULAR | Status: DC | PRN
  Administered 2016-10-08: 3 mg via INTRAVENOUS
  Administered 2016-10-08 (×3): 2 mg via INTRAVENOUS
  Administered 2016-10-09: 3 mg via INTRAVENOUS
  Administered 2016-10-09 (×2): 2 mg via INTRAVENOUS
  Administered 2016-10-09: 3 mg via INTRAVENOUS
  Filled 2016-10-08 (×2): qty 2
  Filled 2016-10-08 (×3): qty 1
  Filled 2016-10-08: qty 2
  Filled 2016-10-08: qty 1
  Filled 2016-10-08: qty 2

## 2016-10-08 NOTE — Progress Notes (Signed)
PULMONARY / CRITICAL CARE MEDICINE   Name: Barry Mccoy MRN: [id number] DOB: [birth year]    ADMISSION DATE:  10/01/2016 CONSULTATION DATE:  1/23  REFERRING MD:  Reesa Chew, MD ([facility])  CHIEF COMPLAINT:  Acute Encephalopathy and intermittent hypoxia   SUBJECTIVE:  Still having agitation issues.   VITAL SIGNS: BP (!) 135/55 (BP Location: Left Arm)   Pulse (!) 59   Temp 98.7 F (37.1 C) (Oral)   Resp 19   Ht 6\' 2"  (1.88 m)   Wt 237 lb 10.5 oz (107.8 kg)   SpO2 96%   BMI 30.51 kg/m   VENTILATOR SETTINGS: Vent Mode: PSV;CPAP FiO2 (%):  [40 %-45 %] 45 % PEEP:  [5 cmH20] 5 cmH20 Pressure Support:  [10 cmH20] 10 cmH20  INTAKE / OUTPUT: I/O last 3 completed shifts: In: 4325.3 [I.V.:2675.3; NG/GT:790; IV Piggyback:860] Out: 5480 [Urine:5480]  PHYSICAL EXAMINATION: Blood pressure (!) 111/44, pulse 71, temperature 99.8 F (37.7 C), temperature source Oral, resp. rate 14, height 6\' 2"  (1.88 m), weight 239 lb 13.8 oz (108.8 kg), SpO2 98 %. Gen:      No acute distress HEENT:  EOMI, sclera anicteric Neck:     No masses; no thyromegaly Lungs:    Bilateral rhonchi CV:         Regular rate and rhythm; no murmurs Abd:      + bowel sounds; soft, non-tender; no palpable masses, no distension Ext:    No edema; adequate peripheral perfusion Skin:      Warm and dry; no rash Neuro: alert and oriented x 3  LABS:  BMET  Recent Labs Lab 10/06/16 0420 10/07/16 0415 10/08/16 0314  NA 146* 148* 144  K 3.4* 3.7 2.8*  CL 112* 112* 104  CO2 26 30 28   BUN 13 15 11   CREATININE 0.99 0.93 0.84  GLUCOSE 141* 150* 154*    Electrolytes  Recent Labs Lab 10/04/16 0329  10/06/16 0420 10/07/16 0415 10/08/16 0314  CALCIUM 8.2*  < > 8.2* 8.5* 8.5*  MG 2.4  --  1.9  --  1.8  PHOS 3.8  --  3.4  --  3.0  < > = values in this interval not displayed.  CBC  Recent Labs Lab 10/06/16 0420 10/07/16 0415 10/08/16 0314  WBC 7.6 7.6 7.0  HGB 8.0* 7.8* 8.0*  HCT 26.9* 26.6* 26.3*  PLT 190  170 147*    Coag's  Recent Labs Lab 10/01/16 2000 10/03/16 0239  APTT  --  37*  INR 1.26 1.34    Sepsis Markers  Recent Labs Lab 10/01/16 2234 10/03/16 0751 10/04/16 0329 10/05/16 0214  LATICACIDVEN 2.15* 0.9  --   --   PROCALCITON  --  3.18 1.82 0.71    ABG  Recent Labs Lab 10/02/16 1530 10/02/16 2113  PHART 7.440 7.361  PCO2ART 31.8* 40.3  PO2ART 72.6* 180.0*    Liver Enzymes  Recent Labs Lab 10/02/16 0955 10/03/16 0751 10/05/16 0214  AST 33 43* 37  ALT 20 32 35  ALKPHOS 70 64 55  BILITOT 2.6* 1.7* 1.2  ALBUMIN 3.0* 2.7* 2.3*    Cardiac Enzymes  Recent Labs Lab 10/02/16 1515 10/03/16 0239 10/03/16 0751  TROPONINI 0.05* 0.05* 0.04*    Glucose  Recent Labs Lab 10/07/16 0907 10/07/16 1220 10/07/16 1713 10/07/16 1916 10/07/16 2334 10/08/16 0330  GLUCAP 152* 137* 166* 151* 165* 166*    Imaging No results found.  STUDIES:  CT head 1/23>> No acute intracranial hemorrhage. Mild chronic microvascular ischemic  changes. CT abd/pelvis 1/23>>Significant inflammatory changes within the left upper quadrant at the tail the pancreas and splenic flexure of the colon. Circumferential colon wall thickening, and small volume of free fluid. Cholelithiasis with circumferential fluid/gallbladder wall Thickening. CXR 1/23>> Left lower lobe consolidation  EGD 1/25>> Portal hypertensive gastropathy versus inflammatory gastritis. EEG 1/25>> severe diffuse generalized slowing, no seizures  CULTURES: UCx 1/23>>multiple species  ANTIBIOTICS: Zosyn 1/23>>  SIGNIFICANT EVENTS: 1/22 admit, transfused 3 u pRBC 1/23 intubated, transfused 2 u pRBC 1/25 underwent EEG and EGD  LINES/TUBES: ETT 1/23>>1/28 RIJ CVC 1/25>>  DISCUSSION: 66 year old man presented with near syncope, dyspnea on exertion found to have symptomatic anemia. Source seems to be GI w/ findings of melena. He was transfused and was hemodynamically stable but developed acute delirium and  agitation requiring a total of 12mg  of ativan. This resulted in somnolence and acute hypoxic respiratory failure with episodes of apnea. He was transferred to the ICU.  ASSESSMENT / PLAN:  NEUROLOGIC/PSYCH A:   Acute encephalopathy: TSH and ammonia normal, CT head with no acute changes, EEG with no seizures Near syncope  Possible Alcohol Withdrawal Anxiety P:   precedex gtt for RASS 0 Continue keppra (home med) Thiamine, folate Continue Zyprexa Continue zoloft Increase Haldol prn Consider scheduled benzos Psych consult pending  PULMONARY A: Apnea w/ acute hypoxic respiratory failure LLL consolidation P:   Supplemental O2 prn   CARDIOVASCULAR A:  Hypovolemia > Resolved  Mild troponin I elevation > peaked at 0.05, downtrended Hypervolemia > improving P:  Keep even  RENAL A:   AKI > Resolved Lactic acidosis > Resolved Non Anion Gap Metabolic Acidosis Elevated CK > 1886 on 1/23. UA negative. P:   Monitor urine output and creatinine  GASTROINTESTINAL A:   Portal hypertensive gastropathy versus inflammatory gastritis on EGD 1/25 Possible colitis  P:   Continue PPI SLP eval He will need colonoscopy to follow up on colitis seen on CT scan  HEMATOLOGIC A:   Acute/subacute blood loss anemia  Microcytic/hypochromic anemia  S/p transfusion 5 units.  P:  Monitor CBC  INFECTIOUS A:   R/o colitis, possible aspiration PNA >> PCT downtrending P:   Zosyn 1/23>>  FAMILY  - Updates: Updated daughter and granddaughter at bedside on 1/25. 1/26 - Inter-disciplinary family meet or Palliative Care meeting due by:  1/30  Jacques Earthly, MD  Internal Medicine PGY-3 Pager: [phone] If no answer or after 3pm call: [phone] 10/08/2016, 8:33 AM   ATTENDING NOTE / ATTESTATION NOTE :   I have discussed the case with the resident/APP  Dr. Jacques Earthly.    I agree with the resident/APP's  history, physical examination, assessment, and plans.    I have edited the  above note and modified it according to our agreed history, physical examination, assessment and plan.   Briefly, pt admitted with generalized weakness and found to have anemia.  He had delirium for which he was intubated for airway protection after getting anxiolytics.   EGD showed gastritis, portal gastropathy. Extubated on 1/28 on precedex.  Overnight, with delirium.  Delirium is getting worse.    Vitals:  Vitals:   10/08/16 0600 10/08/16 0700 10/08/16 0800 10/08/16 0900  BP: 129/68 (!) 148/76 (!) 135/55 125/62  Pulse: (!) 58 61 (!) 59 62  Resp: (!) 22 16 19 17   Temp:   98.3 F (36.8 C)   TempSrc:   Axillary   SpO2: 99% 100% 96% 94%  Weight:      Height:  Constitutional/General:  Pleasant, well-nourished, well-developed, in mild  distress,  Agitated, delirious.   Body mass index is 30.51 kg/m. Wt Readings from Last 3 Encounters:  10/08/16 107.8 kg (237 lb 10.5 oz)   HEENT: Pupils equal and reactive to light and accommodation. Anicteric sclerae. Normal nasal mucosa.   No oral  lesions,  mouth clear,  oropharynx clear, no postnasal drip. (-) Oral thrush. No dental caries.  Airway - Mallampati class III  Neck: No masses. Midline trachea. No JVD, (-) LAD. (-) bruits appreciated.  Respiratory/Chest: Grossly normal chest. (-) deformity. (-) Accessory muscle use.  Symmetric expansion. (-) Tenderness on palpation.  Resonant on percussion.  Diminished BS on both lower lung zones. (-) wheezing,  Rhonchi. Crackles bibasilar.  (-) egophony  Cardiovascular: Regular rate and  rhythm, heart sounds normal, no murmur or gallops, no peripheral edema  Gastrointestinal:  Normal bowel sounds. Soft, non-tender. No hepatosplenomegaly.  (-) masses.   Musculoskeletal:  Normal muscle tone. Normal gait.   Extremities: Grossly normal. (-) clubbing, cyanosis.  (-) edema  Skin: (-) rash,lesions seen.   Neurological/Psychiatric : delirious. Answered simple questions.    CBC Recent  Labs     10/06/16  0420  10/07/16  0415  10/08/16  0314  WBC  7.6  7.6  7.0  HGB  8.0*  7.8*  8.0*  HCT  26.9*  26.6*  26.3*  PLT  190  170  147*    Coag's No results for input(s): APTT, INR in the last 72 hours.  BMET Recent Labs     10/06/16  0420  10/07/16  0415  10/08/16  0314  NA  146*  148*  144  K  3.4*  3.7  2.8*  CL  112*  112*  104  CO2  26  30  28   BUN  13  15  11   CREATININE  0.99  0.93  0.84  GLUCOSE  141*  150*  154*    Electrolytes Recent Labs     10/06/16  0420  10/07/16  0415  10/08/16  0314  CALCIUM  8.2*  8.5*  8.5*  MG  1.9   --   1.8  PHOS  3.4   --   3.0    Sepsis Markers No results for input(s): PROCALCITON, O2SATVEN in the last 72 hours.  Invalid input(s): LACTICACIDVEN  ABG No results for input(s): PHART, PCO2ART, PO2ART in the last 72 hours.  Liver Enzymes No results for input(s): AST, ALT, ALKPHOS, BILITOT, ALBUMIN in the last 72 hours.  Cardiac Enzymes No results for input(s): TROPONINI, PROBNP in the last 72 hours.  Glucose Recent Labs     10/07/16  1220  10/07/16  1713  10/07/16  1916  10/07/16  2334  10/08/16  0330  10/08/16  0833  GLUCAP  137*  166*  151*  165*  166*  139*    Imaging Dg Chest Port 1 View  Result Date: 10/07/2016 CLINICAL DATA:  Acute respiratory failure EXAM: PORTABLE CHEST 1 VIEW COMPARISON:  10/06/2016 FINDINGS: Support devices are unchanged. Mild cardiomegaly. Bilateral airspace opacities and probable layering effusions. Overall aeration may have decreased slightly since prior study. IMPRESSION: Bilateral airspace disease and layering effusions, slightly worsened since prior study. Electronically Signed   By: Rolm Baptise M.D.   On: 10/07/2016 07:20   S/P resp failure 2/2 unable to protect airway. Extubated on 1/28.   Keep o2 sats > 88%.   Delirium 2/2 depression + anxiety + etoh  abuse + baclofen withdrawal. On precedex drip at maximum. On zyprexa 10 mg BID. On Haldol 2 mg q6 prn. Plan to add  clonazepam 0.5 mg BID ODT, increase haldol to 5 mg q6 hrs prn, try fentanyl push, increase zyprexa to 10 mg TID.  There might be withdrawal from benzos and opiates prior to extubation.  Pt might have RLS for which opiates can help. Will consult psyche. Will d/c zoloft and baclofen for now.   Pulmonary edema, S/P lasix. Holding off on diuresis 2/2 being NPO.    I spent  30 minutes of Critical Care time with this patient today. This is my time spent independent of the APP or resident.   Family :Family updated at length today.  Case d/w daughter.   Monica Becton, MD 10/08/2016, 9:48 AM [facility] Pager [phone] After 3 pm or if no answer, call [phone]

## 2016-10-08 NOTE — Progress Notes (Signed)
I updated the patient's daughter, Larena Glassman. We discussed possible etiologies of his agitation and delirium including underlying psychiatric issues, possible alcohol abuse, possible medication withdrawal, ICU related delirium. We also discussed the current plans and expected course with best case scenario of being able to titrate off of the Precedex in the next few days versus worse case scenario of a prolonged ICU stay. She understands that he is at risk for complications, including but not limited to infection etc. She plans to have him live at her house once he is stable for coming home (and understands he may need to transition to a nursing facility in the interim).  Jacques Earthly, MD  Internal Medicine PGY-3 Pager: [phone] 10/08/16 5:01 PM

## 2016-10-08 NOTE — Progress Notes (Signed)
PT Cancellation Note  Patient Details Name: Barry Mccoy MRN: [id number] DOB: [birth year]   Cancelled Treatment:    Reason Eval/Treat Not Completed: Patient not medically ready. RN deferred as pt very agitated. Acute PT to return as able when appropriate.   Eyvette Cordon M Darnesha Diloreto 10/08/2016, 10:01 AM  Kittie Plater, PT, DPT Pager #: [phone] Office #: [phone]

## 2016-10-08 NOTE — Progress Notes (Signed)
Remaining 70 mls of 273ml Fentanyl bag and remaining 15 mls of 50 ml Versed bag wasted in sink.  Witnessed by Pam Drown RN and April Thompson RN.  1 unopened 229ml Fentanyl bag and 1 unopened 24ml Versed bag returned by hand to the main pharmacy at 0255 hrs.

## 2016-10-08 NOTE — Consult Note (Addendum)
SLP Cancellation Note  Patient Details Name: Barry Mccoy MRN: [id number] DOB: 04/09/1951   Cancelled evaluation:       Reason Eval/Treat Not Completed: Medical issues which prohibited therapy. Pt currently agitated, receiving meds for sedation, as well as increased O2 requirements at this time (face tent). Will continue efforts next date.  Mats Jeanlouis B. [place], [facility] [place]  Shonna Chock 10/08/2016, 10:29 AM

## 2016-10-09 DIAGNOSIS — J9601 Acute respiratory failure with hypoxia: Secondary | ICD-10-CM

## 2016-10-09 LAB — CBC
HEMATOCRIT: 26.8 % — AB (ref 39.0–52.0)
HEMOGLOBIN: 8 g/dL — AB (ref 13.0–17.0)
MCH: 25.2 pg — ABNORMAL LOW (ref 26.0–34.0)
MCHC: 29.9 g/dL — ABNORMAL LOW (ref 30.0–36.0)
MCV: 84.5 fL (ref 78.0–100.0)
Platelets: 128 10*3/uL — ABNORMAL LOW (ref 150–400)
RBC: 3.17 MIL/uL — ABNORMAL LOW (ref 4.22–5.81)
RDW: 19.1 % — ABNORMAL HIGH (ref 11.5–15.5)
WBC: 6.6 10*3/uL (ref 4.0–10.5)

## 2016-10-09 LAB — BASIC METABOLIC PANEL
ANION GAP: 7 (ref 5–15)
BUN: 15 mg/dL (ref 6–20)
CO2: 29 mmol/L (ref 22–32)
Calcium: 8.5 mg/dL — ABNORMAL LOW (ref 8.9–10.3)
Chloride: 108 mmol/L (ref 101–111)
Creatinine, Ser: 0.74 mg/dL (ref 0.61–1.24)
GFR calc non Af Amer: >60 mL/min (ref >60)
Glucose, Bld: 135 mg/dL — ABNORMAL HIGH (ref 65–99)
Potassium: 3.6 mmol/L (ref 3.5–5.1)
Sodium: 144 mmol/L (ref 135–145)

## 2016-10-09 LAB — GLUCOSE, CAPILLARY
GLUCOSE-CAPILLARY: 128 mg/dL — AB (ref 65–99)
GLUCOSE-CAPILLARY: 141 mg/dL — AB (ref 65–99)
GLUCOSE-CAPILLARY: 138 mg/dL — AB (ref 65–99)
GLUCOSE-CAPILLARY: 147 mg/dL — AB (ref 65–99)
Glucose-Capillary: 114 mg/dL — ABNORMAL HIGH (ref 65–99)
Glucose-Capillary: 127 mg/dL — ABNORMAL HIGH (ref 65–99)
Glucose-Capillary: 140 mg/dL — ABNORMAL HIGH (ref 65–99)

## 2016-10-09 LAB — MAGNESIUM: Magnesium: 2.1 mg/dL (ref 1.7–2.4)

## 2016-10-09 LAB — PHOSPHORUS: PHOSPHORUS: 3 mg/dL (ref 2.5–4.6)

## 2016-10-09 MED ORDER — ORAL CARE MOUTH RINSE
15.0000 mL | Freq: Two times a day (BID) | OROMUCOSAL | Status: DC
  Administered 2016-10-09 – 2016-10-22 (×19): 15 mL via OROMUCOSAL

## 2016-10-09 MED ORDER — QUETIAPINE FUMARATE 200 MG PO TABS
200.0000 mg | ORAL_TABLET | Freq: Two times a day (BID) | ORAL | Status: DC
  Administered 2016-10-09 – 2016-10-11 (×4): 200 mg
  Filled 2016-10-09 (×4): qty 1

## 2016-10-09 MED ORDER — SERTRALINE HCL 50 MG PO TABS
50.0000 mg | ORAL_TABLET | Freq: Every day | ORAL | Status: DC
  Administered 2016-10-10 – 2016-10-11 (×2): 50 mg
  Filled 2016-10-09 (×2): qty 1

## 2016-10-09 MED ORDER — VITAMIN B-1 100 MG PO TABS: 100.0000 mg | ORAL_TABLET | Freq: Every day | ORAL | Status: DC

## 2016-10-09 MED ORDER — CLONAZEPAM 0.5 MG PO TBDP
1.0000 mg | ORAL_TABLET | Freq: Two times a day (BID) | ORAL | Status: DC
  Administered 2016-10-09 – 2016-10-11 (×4): 1 mg via ORAL
  Filled 2016-10-09 (×4): qty 2

## 2016-10-09 MED ORDER — FOLIC ACID 1 MG PO TABS
1.0000 mg | ORAL_TABLET | Freq: Every day | ORAL | Status: DC
  Administered 2016-10-10 – 2016-10-22 (×13): 1 mg via ORAL
  Filled 2016-10-09 (×13): qty 1

## 2016-10-09 MED ORDER — VITAMIN B-1 100 MG PO TABS
100.0000 mg | ORAL_TABLET | Freq: Every day | ORAL | Status: DC
  Administered 2016-10-10 – 2016-10-11 (×2): 100 mg
  Filled 2016-10-09 (×2): qty 1

## 2016-10-09 MED ORDER — QUETIAPINE FUMARATE 200 MG PO TABS: 200.0000 mg | ORAL_TABLET | Freq: Two times a day (BID) | ORAL | Status: DC

## 2016-10-09 NOTE — Progress Notes (Signed)
SLP Cancellation Note  Patient Details Name: Barry Mccoy MRN: [id number] DOB: [birth year]   Cancelled treatment:       Reason Eval/Treat Not Completed: Medical issues which prohibited therapy - Sedated  Arel Tippen B. Rutherford Nail, M.S., [place] 10/09/2016, 1:57 PM

## 2016-10-09 NOTE — Progress Notes (Signed)
PT Cancellation Note  Patient Details Name: Barry Mccoy MRN: [id number] DOB: 03-18-1951   Cancelled Treatment:    Reason Eval/Treat Not Completed: Patient not medically ready. Pt con't to be on Precedex and sedated due to severe agitation.   Samar Dass M Norene Oliveri 10/09/2016, 9:35 AM   Kittie Plater, PT, DPT Pager #: [phone] Office #: [phone]

## 2016-10-09 NOTE — Progress Notes (Signed)
PULMONARY / CRITICAL CARE MEDICINE   Name: Toshua Wisor MRN: [id number] DOB: 1950-10-03    ADMISSION DATE:  10/01/2016 CONSULTATION DATE:  1/23  REFERRING MD:  Reesa Chew, MD ([facility])  CHIEF COMPLAINT:  Acute Encephalopathy and intermittent hypoxia   SUBJECTIVE:  Still having agitation issues.  Yesterday was able to state social security number and tell a joke to daughter.  VITAL SIGNS: BP 126/75   Pulse (!) 56   Temp 98.6 F (37 C) (Oral)   Resp 20   Ht 6\' 2"  (1.88 m)   Wt 240 lb 8.4 oz (109.1 kg)   SpO2 99%   BMI 30.88 kg/m   VENTILATOR SETTINGS: FiO2 (%):  [30 %-45 %] 30 %  INTAKE / OUTPUT: I/O last 3 completed shifts: In: 3533.1 [I.V.:2445.6; NG/GT:430; IV Piggyback:657.5] Out: 2660 [Urine:2660]  PHYSICAL EXAMINATION: Blood pressure (!) 111/44, pulse 71, temperature 99.8 F (37.7 C), temperature source Oral, resp. rate 14, height 6\' 2"  (1.88 m), weight 239 lb 13.8 oz (108.8 kg), SpO2 98 %. Gen:      No acute distress HEENT:  EOMI, sclera anicteric Neck:     No masses; no thyromegaly Lungs:    Bilateral rhonchi CV:         Regular rate and rhythm; no murmurs Abd:      + bowel sounds; soft, non-tender; no palpable masses, no distension Ext:    No edema; adequate peripheral perfusion Skin:      Warm and dry; no rash Neuro: alert and oriented x 3  LABS:  BMET  Recent Labs Lab 10/08/16 0314 10/08/16 1957 10/09/16 0345  NA 144 144 144  K 2.8* 4.1 3.6  CL 104 109 108  CO2 28 30 29   BUN 11 13 15   CREATININE 0.84 0.80 0.74  GLUCOSE 154* 130* 135*    Electrolytes  Recent Labs Lab 10/06/16 0420  10/08/16 0314 10/08/16 1957 10/09/16 0345  CALCIUM 8.2*  < > 8.5* 8.4* 8.5*  MG 1.9  --  1.8  --  2.1  PHOS 3.4  --  3.0  --  3.0  < > = values in this interval not displayed.  CBC  Recent Labs Lab 10/07/16 0415 10/08/16 0314 10/09/16 0345  WBC 7.6 7.0 6.6  HGB 7.8* 8.0* 8.0*  HCT 26.6* 26.3* 26.8*  PLT 170 147* 128*    Coag's  Recent Labs Lab  10/03/16 0239  APTT 37*  INR 1.34    Sepsis Markers  Recent Labs Lab 10/03/16 0751 10/04/16 0329 10/05/16 0214  LATICACIDVEN 0.9  --   --   PROCALCITON 3.18 1.82 0.71    ABG  Recent Labs Lab 10/02/16 1530 10/02/16 2113  PHART 7.440 7.361  PCO2ART 31.8* 40.3  PO2ART 72.6* 180.0*    Liver Enzymes  Recent Labs Lab 10/03/16 0751 10/05/16 0214 10/08/16 1231  AST 43* 37 34  ALT 32 35 32  ALKPHOS 64 55 56  BILITOT 1.7* 1.2 0.9  ALBUMIN 2.7* 2.3* 2.5*    Cardiac Enzymes  Recent Labs Lab 10/02/16 1515 10/03/16 0239 10/03/16 0751  TROPONINI 0.05* 0.05* 0.04*    Glucose  Recent Labs Lab 10/08/16 0833 10/08/16 1151 10/08/16 1521 10/08/16 1928 10/09/16 0034 10/09/16 0422  GLUCAP 139* 155* 148* 126* 140* 128*    Imaging Dg Abd Portable 1v  Result Date: 10/08/2016 CLINICAL DATA:  Feeding tube placement. EXAM: PORTABLE ABDOMEN - 1 VIEW COMPARISON:  10/05/2016 FINDINGS: The feeding tube tip is at the junction of the second and  third portions of the duodenum. Air throughout the small bowel and colon suggesting an ileus. IMPRESSION: Feeding tube tip is in the distal descending duodenum. Electronically Signed   By: Marijo Sanes M.D.   On: 10/08/2016 12:53    STUDIES:  CT head 1/23>> No acute intracranial hemorrhage. Mild chronic microvascular ischemic changes. CT abd/pelvis 1/23>>Significant inflammatory changes within the left upper quadrant at the tail the pancreas and splenic flexure of the colon. Circumferential colon wall thickening, and small volume of free fluid. Cholelithiasis with circumferential fluid/gallbladder wall Thickening. CXR 1/23>> Left lower lobe consolidation  EGD 1/25>> Portal hypertensive gastropathy versus inflammatory gastritis. EEG 1/25>> severe diffuse generalized slowing, no seizures  CULTURES: UCx 1/23>>multiple species  ANTIBIOTICS: Zosyn 1/23>>1/30  SIGNIFICANT EVENTS: 1/22 admit, transfused 3 u pRBC 1/23 intubated,  transfused 2 u pRBC 1/25 underwent EEG and EGD 1/28 Extubated  LINES/TUBES: ETT 1/23>>1/28 RIJ CVC 1/25>> Cortrak 1/29>>  DISCUSSION: 66 year old man presented with near syncope, dyspnea on exertion found to have symptomatic anemia. Source seems to be GI w/ findings of melena. He was transfused and was hemodynamically stable but developed acute delirium and agitation requiring a total of 12mg  of ativan. This resulted in somnolence and acute hypoxic respiratory failure with episodes of apnea. He was transferred to the ICU.  ASSESSMENT / PLAN:  NEUROLOGIC/PSYCH A:   Acute encephalopathy: TSH and ammonia normal, CT head with no acute changes, EEG with no seizures Near syncope  Possible Alcohol Withdrawal Anxiety P:   Wean precedex gtt as toleratedfor RASS 0 Continue keppra (home med), baclofen Thiamine, folate Change Zyprexa back to Seroquel Continue Aoloft Continue Klonopin BID Consider scheduled pain medication  Haldol prn  PULMONARY A: Apnea w/ acute hypoxic respiratory failure LLL consolidation P:   Supplemental O2 prn   CARDIOVASCULAR A:  Hypovolemia > Resolved  Mild troponin I elevation > peaked at 0.05, downtrended Hypervolemia > Resolving P:  Continue to monitor  RENAL A:   AKI > Resolved Lactic acidosis > Resolved Non Anion Gap Metabolic Acidosis Elevated CK > 1886 on 1/23. UA negative. P:   Monitor urine output and creatinine  GASTROINTESTINAL A:   Portal hypertensive gastropathy versus inflammatory gastritis on EGD 1/25 Possible colitis  P:   Continue PPI, tube feeds He will need colonoscopy to follow up on colitis seen on CT scan  HEMATOLOGIC A:   Acute/subacute blood loss anemia  Microcytic/hypochromic anemia  S/p transfusion 5 units.  P:  Monitor CBC  INFECTIOUS A:   R/o colitis, possible aspiration PNA >> PCT downtrending P:   Zosyn 1/23>>1/30  FAMILY  - Updates: Updated daughter at bedside 1/29 - Inter-disciplinary family meet  or Palliative Care meeting due by:  1/30  Jacques Earthly, MD  Internal Medicine PGY-3 Pager: [phone] If no answer or after 3pm call: [phone] 10/09/2016, 7:35 AM   ATTENDING NOTE / ATTESTATION NOTE :   I have discussed the case with the resident/APP  Dr. Jacques Earthly.   I agree with the resident/APP's  history, physical examination, assessment, and plans.    I have edited the above note and modified it according to our agreed history, physical examination, assessment and plan.   Briefly, pt admitted with generalized weakness and found to have anemia.  He had delirium for which he was intubated for airway protection after getting anxiolytics.   EGD showed gastritis, portal gastropathy. Extubated on 1/28 on precedex. Delirium has been difficult to control.  Remains on precedex drip.   Vitals:  Vitals:  10/09/16 0700 10/09/16 0718 10/09/16 0844 10/09/16 1228  BP: 126/75 126/75    Pulse: (!) 54 (!) 56    Resp: 17 20    Temp:   97.1 F (36.2 C) 98 F (36.7 C)  TempSrc:   Oral Oral  SpO2: 99% 99%    Weight:      Height:        Constitutional/General: well-nourished, well-developed, in and out of confusion and agitation.   Body mass index is 30.88 kg/m. Wt Readings from Last 3 Encounters:  10/09/16 109.1 kg (240 lb 8.4 oz)    HEENT: PERLA, anicteric sclerae. (-) Oral thrush.   Neck: No masses. Midline trachea. No JVD, (-) LAD. (-) bruits appreciated.  Respiratory/Chest: Grossly normal chest. (-) deformity. (-) Accessory muscle use.  Symmetric expansion. Diminished BS on both lower lung zones. (-) wheezing,rhonchi Crackles at bases.  (-) egophony  Cardiovascular: Regular rate and  rhythm, heart sounds normal, no murmur or gallops,  Trace peripheral edema  Gastrointestinal:  Normal bowel sounds. Soft, non-tender. No hepatosplenomegaly.  (-) masses.   Musculoskeletal:  Normal muscle tone.   Extremities: Grossly normal. (-) clubbing, cyanosis.  (-)  edema  Skin: (-) rash,lesions seen.   Neurological/Psychiatric : sedated, confused. CN grossly intact. (-) lateralizing signs.    CBC Recent Labs     10/07/16  0415  10/08/16  0314  10/09/16  0345  WBC  7.6  7.0  6.6  HGB  7.8*  8.0*  8.0*  HCT  26.6*  26.3*  26.8*  PLT  170  147*  128*    Coag's No results for input(s): APTT, INR in the last 72 hours.  BMET Recent Labs     10/08/16  0314  10/08/16  1957  10/09/16  0345  NA  144  144  144  K  2.8*  4.1  3.6  CL  104  109  108  CO2  28  30  29   BUN  11  13  15   CREATININE  0.84  0.80  0.74  GLUCOSE  154*  130*  135*    Electrolytes Recent Labs     10/08/16  0314  10/08/16  1957  10/09/16  0345  CALCIUM  8.5*  8.4*  8.5*  MG  1.8   --   2.1  PHOS  3.0   --   3.0    Sepsis Markers No results for input(s): PROCALCITON, O2SATVEN in the last 72 hours.  Invalid input(s): LACTICACIDVEN  ABG No results for input(s): PHART, PCO2ART, PO2ART in the last 72 hours.  Liver Enzymes Recent Labs     10/08/16  1231  AST  34  ALT  32  ALKPHOS  56  BILITOT  0.9  ALBUMIN  2.5*    Cardiac Enzymes No results for input(s): TROPONINI, PROBNP in the last 72 hours.  Glucose Recent Labs     10/08/16  1521  10/08/16  1928  10/09/16  0034  10/09/16  0422  10/09/16  0840  10/09/16  1227  GLUCAP  148*  126*  140*  128*  141*  138*    Imaging Dg Abd Portable 1v  Result Date: 10/08/2016 CLINICAL DATA:  Feeding tube placement. EXAM: PORTABLE ABDOMEN - 1 VIEW COMPARISON:  10/05/2016 FINDINGS: The feeding tube tip is at the junction of the second and third portions of the duodenum. Air throughout the small bowel and colon suggesting an ileus. IMPRESSION: Feeding tube tip is in the  distal descending duodenum. Electronically Signed   By: Marijo Sanes M.D.   On: 10/08/2016 12:53   Assessment/plan : S/P resp failure 2/2 unable to protect airway. Extubated on 1/28.   Keep o2 sats > 88%. Currently protecting airway.    Delirium 2/2 depression + anxiety + etoh abuse + baclofen withdrawal. On precedex drip at maximum. Will restart seroquel at a higher dose 200 mg BID (he now has dubhoff), d/c zyprexa, increase clonazepam to 1 mg BID (from 0.5 mg BID). Keep prn fentanyl (component of opiate withdrawal). On Haldol 2 mg q6 prn. There might be withdrawal from benzos and opiates prior to extubation.  Pt might have RLS for which opiates can help. Will consult psyche once he is more with it. Resume zoloft and baclofen.   Pulmonary edema, S/P lasix. Holding off on diuresis for now.   I spent  30   minutes of Critical Care time with this patient today. This is my time spent independent of the APP or resident.   Family :  No family at bedside.    Monica Becton, MD 10/09/2016, 12:46 PM [facility] Pager [phone] After 3 pm or if no answer, call [phone]

## 2016-10-10 LAB — GLUCOSE, CAPILLARY
GLUCOSE-CAPILLARY: 121 mg/dL — AB (ref 65–99)
GLUCOSE-CAPILLARY: 85 mg/dL (ref 65–99)
Glucose-Capillary: 136 mg/dL — ABNORMAL HIGH (ref 65–99)
Glucose-Capillary: 79 mg/dL (ref 65–99)

## 2016-10-10 LAB — BASIC METABOLIC PANEL
ANION GAP: 4 — AB (ref 5–15)
BUN: 17 mg/dL (ref 6–20)
CHLORIDE: 112 mmol/L — AB (ref 101–111)
CO2: 27 mmol/L (ref 22–32)
Calcium: 8.3 mg/dL — ABNORMAL LOW (ref 8.9–10.3)
Creatinine, Ser: 0.71 mg/dL (ref 0.61–1.24)
GFR calc Af Amer: >60 mL/min (ref >60)
GFR calc non Af Amer: >60 mL/min (ref >60)
GLUCOSE: 132 mg/dL — AB (ref 65–99)
Potassium: 3.5 mmol/L (ref 3.5–5.1)
Sodium: 143 mmol/L (ref 135–145)

## 2016-10-10 LAB — CBC
HEMATOCRIT: 30.2 % — AB (ref 39.0–52.0)
HEMOGLOBIN: 9 g/dL — AB (ref 13.0–17.0)
MCH: 25 pg — ABNORMAL LOW (ref 26.0–34.0)
MCHC: 29.8 g/dL — ABNORMAL LOW (ref 30.0–36.0)
MCV: 83.9 fL (ref 78.0–100.0)
Platelets: 136 10*3/uL — ABNORMAL LOW (ref 150–400)
RBC: 3.6 MIL/uL — ABNORMAL LOW (ref 4.22–5.81)
RDW: 19 % — AB (ref 11.5–15.5)
WBC: 5.8 10*3/uL (ref 4.0–10.5)

## 2016-10-10 LAB — PHOSPHORUS: PHOSPHORUS: 3 mg/dL (ref 2.5–4.6)

## 2016-10-10 LAB — MAGNESIUM: MAGNESIUM: 2.1 mg/dL (ref 1.7–2.4)

## 2016-10-10 MED ORDER — AMIODARONE LOAD VIA INFUSION
150.0000 mg | Freq: Once | INTRAVENOUS | Status: AC
  Administered 2016-10-10: 150 mg via INTRAVENOUS
  Filled 2016-10-10: qty 83.34

## 2016-10-10 MED ORDER — POTASSIUM CHLORIDE 20 MEQ/15ML (10%) PO SOLN
40.0000 meq | Freq: Once | ORAL | Status: AC
  Administered 2016-10-10: 40 meq via ORAL
  Filled 2016-10-10: qty 30

## 2016-10-10 MED ORDER — AMIODARONE HCL IN DEXTROSE 360-4.14 MG/200ML-% IV SOLN
30.0000 mg/h | INTRAVENOUS | Status: DC
  Administered 2016-10-11: 30 mg/h via INTRAVENOUS
  Filled 2016-10-10 (×3): qty 200

## 2016-10-10 MED ORDER — LORAZEPAM 2 MG/ML IJ SOLN
1.0000 mg | INTRAMUSCULAR | Status: DC | PRN
  Administered 2016-10-11 – 2016-10-15 (×8): 1 mg via INTRAVENOUS
  Filled 2016-10-10 (×9): qty 1

## 2016-10-10 MED ORDER — METOPROLOL TARTRATE 5 MG/5ML IV SOLN
INTRAVENOUS | Status: AC
  Administered 2016-10-10: 5 mg via INTRAVENOUS
  Filled 2016-10-10: qty 5

## 2016-10-10 MED ORDER — METOPROLOL TARTRATE 5 MG/5ML IV SOLN
5.0000 mg | Freq: Once | INTRAVENOUS | Status: AC | PRN
  Administered 2016-10-10: 5 mg via INTRAVENOUS

## 2016-10-10 MED ORDER — LEVETIRACETAM ER 500 MG PO TB24
500.0000 mg | ORAL_TABLET | Freq: Every day | ORAL | Status: DC
  Administered 2016-10-10 – 2016-10-18 (×9): 500 mg via ORAL
  Filled 2016-10-10 (×9): qty 1

## 2016-10-10 MED ORDER — AMIODARONE HCL IN DEXTROSE 360-4.14 MG/200ML-% IV SOLN
60.0000 mg/h | INTRAVENOUS | Status: DC
Start: 2016-10-10 — End: 2016-10-11
  Administered 2016-10-10 – 2016-10-11 (×2): 60 mg/h via INTRAVENOUS
  Filled 2016-10-10: qty 200

## 2016-10-10 NOTE — Evaluation (Signed)
Physical Therapy Evaluation Patient Details Name: Barry Mccoy MRN: [id number] DOB: Oct 20, 1950 Today's Date: 10/10/2016   History of Present Illness  Pt is a 66 yo male admitted for syncope and GI bleeding. Pt found to have Acute Encephalopathy and intermittent hypoxia. Pt with know ETOH abuse.  Clinical Impression  Pt admitted with above. Pt oriented to name, place and date. Pt indep PTA now requiring maxAx2 for all mobility. Pt's HR increased into 170s during PT treatment. Pt also had 3 episodes of loose stools during PT eval. Pt to benefit fro ST-SNF upon d/c to address mentioned deficits and achieve safe mod I level of function for safe transition home.    Follow Up Recommendations SNF;Supervision/Assistance - 24 hour    Equipment Recommendations   (TBD)    Recommendations for Other Services       Precautions / Restrictions Precautions Precautions: Fall Precaution Comments: lethargic from sedation, also in 5 point restraints Restrictions Weight Bearing Restrictions: No      Mobility  Bed Mobility Overal bed mobility: Needs Assistance Bed Mobility: Rolling;Sidelying to Sit;Sit to Supine Rolling: Min assist Sidelying to sit: Max assist;+2 for physical assistance   Sit to supine: Max assist;+2 for physical assistance   General bed mobility comments: pt initiated all transfers but maxA for trunk elevation required and modA for LE management back into bed and trunk management  Transfers Overall transfer level: Needs assistance Equipment used:  (2 person lift with bed pad) Transfers: Sit to/from Stand Sit to Stand: Max assist;+2 physical assistance         General transfer comment: unable to achieve full standing, barely cleared buttocks from the bed  Ambulation/Gait                Stairs            Wheelchair Mobility    Modified Rankin (Stroke Patients Only)       Balance Overall balance assessment: Needs assistance Sitting-balance support: Feet  supported;Bilateral upper extremity supported Sitting balance-Leahy Scale: Poor Sitting balance - Comments: strong lean to the R, even with assist unable to achieve midline Postural control: Right lateral lean                                   Pertinent Vitals/Pain Pain Assessment: No/denies pain    Home Living Family/patient expects to be discharged to:: Private residence Living Arrangements: Alone Available Help at Discharge: Family;Available PRN/intermittently Type of Home: Apartment Home Access: Level entry     Home Layout: One level Home Equipment: None      Prior Function Level of Independence: Independent         Comments: pt reports he was indep PTA and didn't use AD     Hand Dominance        Extremity/Trunk Assessment   Upper Extremity Assessment Upper Extremity Assessment: Generalized weakness    Lower Extremity Assessment Lower Extremity Assessment: Generalized weakness    Cervical / Trunk Assessment Cervical / Trunk Assessment: Normal  Communication   Communication: No difficulties  Cognition Arousal/Alertness: Lethargic (weaning off prededex) Behavior During Therapy: Flat affect Overall Cognitive Status: Impaired/Different from baseline Area of Impairment: Following commands;Problem solving;Safety/judgement       Following Commands: Follows one step commands with increased time Safety/Judgement: Decreased awareness of safety;Decreased awareness of deficits   Problem Solving: Slow processing;Difficulty sequencing;Requires verbal cues;Requires tactile cues General Comments: pt slightly impulsive but did follow  commands    General Comments      Exercises     Assessment/Plan    PT Assessment Patient needs continued PT services  PT Problem List Decreased strength;Decreased activity tolerance;Decreased balance;Decreased mobility;Decreased cognition;Decreased knowledge of use of DME;Decreased safety awareness          PT  Treatment Interventions DME instruction;Gait training;Functional mobility training;Therapeutic activities;Therapeutic exercise;Balance training;Neuromuscular re-education;Cognitive remediation    PT Goals (Current goals can be found in the Care Plan section)  Acute Rehab PT Goals Patient Stated Goal: didn't state PT Goal Formulation: With patient Time For Goal Achievement: 10/17/16 Potential to Achieve Goals: Good    Frequency Min 3X/week   Barriers to discharge Decreased caregiver support lives alone    Co-evaluation               End of Session Equipment Utilized During Treatment: Gait belt Activity Tolerance: Patient tolerated treatment well Patient left: in bed;with call bell/phone within reach;with restraints reapplied;with nursing/sitter in room Nurse Communication: Mobility status (RN present during eval)         Time: 1430-1453 PT Time Calculation (min) (ACUTE ONLY): 23 min   Charges:   PT Evaluation $PT Eval Moderate Complexity: 1 Procedure PT Treatments $Therapeutic Activity: 8-22 mins   PT G Codes:        Othello Dickenson M Slade [place] 10/10/2016, 3:31 PM   Kittie Plater, PT, DPT Pager #: [phone] Office #: [phone]

## 2016-10-10 NOTE — Progress Notes (Signed)
PULMONARY / CRITICAL CARE MEDICINE   Name: Barry Mccoy MRN: [id number] DOB: Dec 06, 1950    ADMISSION DATE:  10/01/2016 CONSULTATION DATE:  1/23  REFERRING MD:  Reesa Chew, MD ([facility])  CHIEF COMPLAINT:  Acute Encephalopathy and intermittent hypoxia   SUBJECTIVE: Less agitation issues overnight.   VITAL SIGNS: BP 123/76   Pulse 62   Temp 98.7 F (37.1 C) (Oral)   Resp 19   Ht 6\' 2"  (1.88 m)   Wt 238 lb 1.6 oz (108 kg)   SpO2 97%   BMI 30.57 kg/m   VENTILATOR SETTINGS: FiO2 (%):  [30 %] 30 %  INTAKE / OUTPUT: I/O last 3 completed shifts: In: 2592 [I.V.:1704.5; Other:20; NG/GT:690; IV Piggyback:177.5] Out: 2295 [Urine:2295]  PHYSICAL EXAMINATION: Blood pressure (!) 111/44, pulse 71, temperature 99.8 F (37.7 C), temperature source Oral, resp. rate 14, height 6\' 2"  (1.88 m), weight 239 lb 13.8 oz (108.8 kg), SpO2 98 %. General Apperance: NAD HEENT: Normocephalic, atraumatic, anicteric sclera Neck: Supple, trachea midline Lungs: Clear to auscultation bilaterally. No wheezes. Breathing comfortably on room air Heart: Regular rate and rhythm, no murmur/rub/gallop Abdomen: Soft, nontender, nondistended, no rebound/guarding Extremities: Warm and well perfused, no edema Skin: No rashes or lesions Neurologic: Sedated   LABS:  BMET  Recent Labs Lab 10/08/16 1957 10/09/16 0345 10/10/16 0509  NA 144 144 143  K 4.1 3.6 3.5  CL 109 108 112*  CO2 30 29 27   BUN 13 15 17   CREATININE 0.80 0.74 0.71  GLUCOSE 130* 135* 132*    Electrolytes  Recent Labs Lab 10/06/16 0420  10/08/16 0314 10/08/16 1957 10/09/16 0345 10/10/16 0509  CALCIUM 8.2*  < > 8.5* 8.4* 8.5* 8.3*  MG 1.9  --  1.8  --  2.1  --   PHOS 3.4  --  3.0  --  3.0  --   < > = values in this interval not displayed.  CBC  Recent Labs Lab 10/08/16 0314 10/09/16 0345 10/10/16 0509  WBC 7.0 6.6 5.8  HGB 8.0* 8.0* 9.0*  HCT 26.3* 26.8* 30.2*  PLT 147* 128* 136*   Sepsis Markers  Recent Labs Lab  10/03/16 0751 10/04/16 0329 10/05/16 0214  LATICACIDVEN 0.9  --   --   PROCALCITON 3.18 1.82 0.71    Liver Enzymes  Recent Labs Lab 10/03/16 0751 10/05/16 0214 10/08/16 1231  AST 43* 37 34  ALT 32 35 32  ALKPHOS 64 55 56  BILITOT 1.7* 1.2 0.9  ALBUMIN 2.7* 2.3* 2.5*    Cardiac Enzymes  Recent Labs Lab 10/03/16 0751  TROPONINI 0.04*    Glucose  Recent Labs Lab 10/09/16 0840 10/09/16 1227 10/09/16 1557 10/09/16 2015 10/09/16 2326 10/10/16 0429  GLUCAP 141* 138* 147* 127* 114* 136*    STUDIES:  CT head 1/23>> No acute intracranial hemorrhage. Mild chronic microvascular ischemic changes. CT abd/pelvis 1/23>>Significant inflammatory changes within the left upper quadrant at the tail the pancreas and splenic flexure of the colon. Circumferential colon wall thickening, and small volume of free fluid. Cholelithiasis with circumferential fluid/gallbladder wall Thickening. CXR 1/23>> Left lower lobe consolidation  EGD 1/25>> Portal hypertensive gastropathy versus inflammatory gastritis. EEG 1/25>> severe diffuse generalized slowing, no seizures  CULTURES: UCx 1/23>>multiple species  ANTIBIOTICS: Zosyn 1/23>>1/30  SIGNIFICANT EVENTS: 1/22 admit, transfused 3 u pRBC 1/23 intubated, transfused 2 u pRBC 1/25 underwent EEG and EGD 1/28 Extubated  LINES/TUBES: ETT 1/23>>1/28 RIJ CVC 1/25>> Cortrak 1/29>>  DISCUSSION: 66 year old man presented with near syncope, dyspnea on exertion  found to have symptomatic anemia. Source seems to be GI w/ findings of melena. He was transfused and was hemodynamically stable but developed acute delirium and agitation requiring a total of 12mg  of ativan. This resulted in somnolence and acute hypoxic respiratory failure with episodes of apnea. He was transferred to the ICU.  ASSESSMENT / PLAN:  NEUROLOGIC/PSYCH A:   Acute encephalopathy: TSH and ammonia normal, CT head with no acute changes, EEG with no seizures Possible  Alcohol Withdrawal Anxiety P:   Wean precedex gtt as tolerated for RASS 0 Continue keppra (home med), baclofen Thiamine, folate Continue Seroquel 200mg  BID Continue Zoloft 50mg  daily Continue Klonopin 1mg  BID IV fent and Haldol prn  PULMONARY A: Apnea w/ acute hypoxic respiratory failure > resolved LLL consolidation P:   Supplemental O2 prn   CARDIOVASCULAR A:  Mild troponin I elevation > peaked at 0.05, downtrended P:  Continue to monitor  RENAL A:   AKI > Resolved Lactic acidosis > Resolved Non Anion Gap Metabolic Acidosis > Resolved Elevated CK > 1886 on 1/23. UA negative. P:   Monitor urine output and creatinine  GASTROINTESTINAL A:   Portal hypertensive gastropathy versus inflammatory gastritis on EGD 1/25 Possible colitis  P:   Continue PPI, tube feeds He will need colonoscopy to follow up on colitis seen on CT scan  HEMATOLOGIC A:   Acute/subacute blood loss anemia > improving S/p transfusion 5 units.  P:  Monitor CBC  INFECTIOUS A:   R/o colitis, possible aspiration PNA >> PCT downtrending P:   Zosyn 1/23>>1/30  FAMILY  - Updates: Updated daughter at bedside 1/29 - Inter-disciplinary family meet or Palliative Care meeting due by:  1/30  Jacques Earthly, MD  Internal Medicine PGY-3 Pager: [phone] If no answer or after 3pm call: [phone] 10/10/2016, 6:58 AM   ATTENDING NOTE / ATTESTATION NOTE :   I have discussed the case with the resident/APP  Dr. Jacques Earthly.   I agree with the resident/APP's  history, physical examination, assessment, and plans.    I have edited the above note and modified it according to our agreed history, physical examination, assessment and plan.   Briefly, pt admitted with generalized weakness and found to have anemia. He had delirium for which he was intubated for airway protection after getting anxiolytics. EGD showed gastritis, portal gastropathy. Extubated on 1/28 on precedex. Delirium has been  difficult to control.   Last 24 hrs, pt has been weaned off precedex drip. In and out of sleepiness. In and out of sinus tachycardia/SVT.  Got 8 mg ativan, 25 mcg fentanyl, 5 mg haldol >>> last 24 hrs on top of scheduled meds.   Vitals:  Vitals:   10/10/16 1100 10/10/16 1157 10/10/16 1200 10/10/16 1300  BP: 140/72  (!) 134/92 123/62  Pulse: 98  96 95  Resp: (!) 32  (!) 28 (!) 31  Temp:  98.8 F (37.1 C)    TempSrc:  Axillary    SpO2: 91%  95% 94%  Weight:      Height:        Constitutional/General: well-nourished, well-developed, drowsy, arousable. Protecting his airway.   Body mass index is 30.57 kg/m. Wt Readings from Last 3 Encounters:  10/10/16 108 kg (238 lb 1.6 oz)    HEENT: PERLA, anicteric sclerae. (-) Oral thrush.  Neck: No masses. Midline trachea. No JVD, (-) LAD. (-) bruits appreciated.  Respiratory/Chest: Grossly normal chest. (-) deformity. (-) Accessory muscle use.  Symmetric expansion. Diminished BS on both lower lung  zones. (-) wheezing,rhonchi Crackles at bases.  (-) egophony  Cardiovascular: Regular rate and  rhythm, heart sounds normal, no murmur or gallops,  Gr 1 peripheral edema  Gastrointestinal:  Normal bowel sounds. Soft, non-tender. No hepatosplenomegaly.  (-) masses.   Musculoskeletal:  Normal muscle tone.   Extremities: Grossly normal. (-) clubbing, cyanosis.  (-) edema  Skin: (-) rash,lesions seen.   Neurological/Psychiatric : . CN grossly intact. (-) lateralizing signs. Drowsy, arousable.    CBC Recent Labs     10/08/16  0314  10/09/16  0345  10/10/16  0509  WBC  7.0  6.6  5.8  HGB  8.0*  8.0*  9.0*  HCT  26.3*  26.8*  30.2*  PLT  147*  128*  136*    Coag's No results for input(s): APTT, INR in the last 72 hours.  BMET Recent Labs     10/08/16  1957  10/09/16  0345  10/10/16  0509  NA  144  144  143  K  4.1  3.6  3.5  CL  109  108  112*  CO2  30  29  27   BUN  13  15  17   CREATININE  0.80  0.74  0.71  GLUCOSE   130*  135*  132*    Electrolytes Recent Labs     10/08/16  0314  10/08/16  1957  10/09/16  0345  10/10/16  0509  10/10/16  1209  CALCIUM  8.5*  8.4*  8.5*  8.3*   --   MG  1.8   --   2.1   --   2.1  PHOS  3.0   --   3.0   --   3.0    Sepsis Markers No results for input(s): PROCALCITON, O2SATVEN in the last 72 hours.  Invalid input(s): LACTICACIDVEN  ABG No results for input(s): PHART, PCO2ART, PO2ART in the last 72 hours.  Liver Enzymes Recent Labs     10/08/16  1231  AST  34  ALT  32  ALKPHOS  56  BILITOT  0.9  ALBUMIN  2.5*    Cardiac Enzymes No results for input(s): TROPONINI, PROBNP in the last 72 hours.  Glucose Recent Labs     10/09/16  1557  10/09/16  2015  10/09/16  2326  10/10/16  0429  10/10/16  0841  10/10/16  1155  GLUCAP  147*  127*  114*  136*  121*  79    Imaging No results found.  S/P resp failure 2/2 unable to protect airway. Extubated on 1/28. Keep o2 sats >88%. Currently protecting airway.  On and off drowsiness related to agitation meds.  More "awake" today. Protecting his airway.   Delirium 2/2 depression + anxiety + etoh abuse + baclofen withdrawal. Precedex drip weaned off this am Cont Seroquel at 200 mg BID and  clonazepam to 1 mg BID (from 0.5 mg BID). Keep prn fentanyl (component of opiate withdrawal). On Haldol 5  mg q6 prn.Will decrease prn dose of ativan to 1 mg q3. There might be withdrawal from benzos and opiates prior to extubation. Pt might have RLS for which opiates can help. Will consult psyche once he is more with it. Cont  zoloft and baclofen.   Pulmonary edema, S/P lasix. Holding off on diuresis for now. Keeping euvolemic.   SVT. In and out of sinus tachycardia. I am not sure if it only happens when he is asleep.  When we arouse him, HR gets  better.  Not sure if he has untreated OSA causing brady-tachy syndrome. Not sure if its psyche meds causing this (seroquel).  Will correct electrolytes.   Lopressor prn.  If  persistent, may try bipap 12/5 and see if that helps him (untreated OSA). Will also consider holding off on seroquel. Check echo as well.    I spent  30   minutes of Critical Care time with this patient today. This is my time spent independent of the APP or resident.   Family : No family at bedside.    Monica Becton, MD 10/10/2016, 2:20 PM [place] [facility] Pager [phone] After 3 pm or if no answer, call [phone]

## 2016-10-10 NOTE — Progress Notes (Signed)
eLink Physician-Brief Progress Note Patient Name: Barry Mccoy DOB: 02-18-1951 MRN: [id number]   Date of Service  10/10/2016  HPI/Events of Note  Rhythm shows in and out of afib  eICU Interventions  Will start amiodarone infusion     Intervention Category Major Interventions: Arrhythmia - evaluation and management  Alina Gilkey 10/10/2016, 10:08 PM

## 2016-10-10 NOTE — Progress Notes (Signed)
SLP Cancellation Note  Patient Details Name: Barry Mccoy MRN: [id number] DOB: 02/01/1951   Cancelled treatment:       Reason Eval/Treat Not Completed: Medical issues which prohibited therapy;Patient's level of consciousness  Gabriel Rainwater MA, [place] [phone]  Gabriel Rainwater [name] 10/10/2016, 11:21 AM

## 2016-10-11 ENCOUNTER — Inpatient Hospital Stay (HOSPITAL_COMMUNITY): Payer: Medicare Other

## 2016-10-11 DIAGNOSIS — R410 Disorientation, unspecified: Secondary | ICD-10-CM

## 2016-10-11 DIAGNOSIS — F323 Major depressive disorder, single episode, severe with psychotic features: Secondary | ICD-10-CM

## 2016-10-11 DIAGNOSIS — R0600 Dyspnea, unspecified: Secondary | ICD-10-CM

## 2016-10-11 LAB — GLUCOSE, CAPILLARY
GLUCOSE-CAPILLARY: 123 mg/dL — AB (ref 65–99)
GLUCOSE-CAPILLARY: 108 mg/dL — AB (ref 65–99)
Glucose-Capillary: 110 mg/dL — ABNORMAL HIGH (ref 65–99)
Glucose-Capillary: 112 mg/dL — ABNORMAL HIGH (ref 65–99)
Glucose-Capillary: 86 mg/dL (ref 65–99)
Glucose-Capillary: 98 mg/dL (ref 65–99)

## 2016-10-11 LAB — BASIC METABOLIC PANEL
ANION GAP: 6 (ref 5–15)
BUN: 14 mg/dL (ref 6–20)
CALCIUM: 8.3 mg/dL — AB (ref 8.9–10.3)
CHLORIDE: 112 mmol/L — AB (ref 101–111)
CO2: 24 mmol/L (ref 22–32)
Creatinine, Ser: 0.82 mg/dL (ref 0.61–1.24)
GFR calc Af Amer: >60 mL/min (ref >60)
GFR calc non Af Amer: >60 mL/min (ref >60)
GLUCOSE: 129 mg/dL — AB (ref 65–99)
Potassium: 3 mmol/L — ABNORMAL LOW (ref 3.5–5.1)
Sodium: 142 mmol/L (ref 135–145)

## 2016-10-11 LAB — CBC
HCT: 30.7 % — ABNORMAL LOW (ref 39.0–52.0)
Hemoglobin: 9.1 g/dL — ABNORMAL LOW (ref 13.0–17.0)
MCH: 24.5 pg — AB (ref 26.0–34.0)
MCHC: 29.6 g/dL — AB (ref 30.0–36.0)
MCV: 82.7 fL (ref 78.0–100.0)
Platelets: 147 10*3/uL — ABNORMAL LOW (ref 150–400)
RBC: 3.71 MIL/uL — ABNORMAL LOW (ref 4.22–5.81)
RDW: 19.7 % — ABNORMAL HIGH (ref 11.5–15.5)
WBC: 7.8 10*3/uL (ref 4.0–10.5)

## 2016-10-11 LAB — PHOSPHORUS: Phosphorus: 2 mg/dL — ABNORMAL LOW (ref 2.5–4.6)

## 2016-10-11 LAB — ECHOCARDIOGRAM COMPLETE
HEIGHTINCHES: 74 in
WEIGHTICAEL: 3809.55 [oz_av]

## 2016-10-11 LAB — MAGNESIUM: MAGNESIUM: 1.9 mg/dL (ref 1.7–2.4)

## 2016-10-11 MED ORDER — QUETIAPINE FUMARATE 100 MG PO TABS
100.0000 mg | ORAL_TABLET | Freq: Two times a day (BID) | ORAL | Status: DC
  Administered 2016-10-11 – 2016-10-17 (×12): 100 mg via ORAL
  Filled 2016-10-11 (×12): qty 1

## 2016-10-11 MED ORDER — DEXMEDETOMIDINE HCL IN NACL 200 MCG/50ML IV SOLN
0.2000 ug/kg/h | INTRAVENOUS | Status: DC
  Administered 2016-10-11: 0.7 ug/kg/h via INTRAVENOUS
  Administered 2016-10-11: 1.5 ug/kg/h via INTRAVENOUS
  Administered 2016-10-11: 0.2 ug/kg/h via INTRAVENOUS
  Filled 2016-10-11 (×2): qty 50
  Filled 2016-10-11: qty 150

## 2016-10-11 MED ORDER — PANTOPRAZOLE SODIUM 40 MG PO TBEC
40.0000 mg | DELAYED_RELEASE_TABLET | Freq: Every day | ORAL | Status: DC
  Administered 2016-10-11 – 2016-10-22 (×12): 40 mg via ORAL
  Filled 2016-10-11 (×12): qty 1

## 2016-10-11 MED ORDER — DEXTROSE 5 % IV SOLN
30.0000 mmol | Freq: Once | INTRAVENOUS | Status: AC
  Administered 2016-10-11: 30 mmol via INTRAVENOUS
  Filled 2016-10-11: qty 10

## 2016-10-11 MED ORDER — BACLOFEN 10 MG PO TABS
40.0000 mg | ORAL_TABLET | Freq: Every day | ORAL | Status: DC
  Administered 2016-10-11 – 2016-10-16 (×6): 40 mg via ORAL
  Filled 2016-10-11: qty 2
  Filled 2016-10-11 (×4): qty 4
  Filled 2016-10-11: qty 2

## 2016-10-11 MED ORDER — METOPROLOL TARTRATE 5 MG/5ML IV SOLN
2.5000 mg | INTRAVENOUS | Status: DC | PRN
  Administered 2016-10-12: 5 mg via INTRAVENOUS
  Filled 2016-10-11: qty 5

## 2016-10-11 MED ORDER — CLONAZEPAM 1 MG PO TBDP
1.0000 mg | ORAL_TABLET | Freq: Three times a day (TID) | ORAL | Status: DC
  Administered 2016-10-11 – 2016-10-12 (×5): 1 mg via ORAL
  Filled 2016-10-11 (×5): qty 2

## 2016-10-11 MED ORDER — ENSURE ENLIVE PO LIQD
237.0000 mL | Freq: Two times a day (BID) | ORAL | Status: DC
  Administered 2016-10-12 – 2016-10-22 (×13): 237 mL via ORAL

## 2016-10-11 MED ORDER — VITAMIN B-1 100 MG PO TABS
100.0000 mg | ORAL_TABLET | Freq: Every day | ORAL | Status: DC
  Administered 2016-10-12 – 2016-10-18 (×7): 100 mg via ORAL
  Filled 2016-10-11 (×7): qty 1

## 2016-10-11 MED ORDER — QUETIAPINE FUMARATE 100 MG PO TABS: 100.0000 mg | ORAL_TABLET | Freq: Two times a day (BID) | ORAL | Status: DC

## 2016-10-11 MED ORDER — SERTRALINE HCL 50 MG PO TABS
50.0000 mg | ORAL_TABLET | Freq: Every day | ORAL | Status: DC
  Administered 2016-10-12 – 2016-10-22 (×10): 50 mg via ORAL
  Filled 2016-10-11 (×11): qty 1

## 2016-10-11 NOTE — Progress Notes (Signed)
[place] Progress Note Patient Name: Barry Mccoy DOB: 1951/01/28 MRN: [id number]   Date of Service  10/11/2016  HPI/Events of Note  Intermittent tachycardia with HR in the 140s.  HD stable.  Had been on Amiodarone but this was d/ced today.  Plan in notes was to start on PRN lopressor if needed.  eICU Interventions  PRN lopressor ordered for HR control     Intervention Category Intermediate Interventions: Arrhythmia - evaluation and management  DETERDING,ELIZABETH 10/11/2016, 11:21 PM

## 2016-10-11 NOTE — Progress Notes (Signed)
[place] Progress Note Patient Name: Barry Mccoy DOB: Dec 12, 1950 MRN: [id number]   Date of Service  10/11/2016  HPI/Events of Note  Delirium - Currently on Precedex at 0.7 mcg/kg/hour.  eICU Interventions  Will increase ceiling on Precedex IV infusion to 1.5 mcg/kg/hour.      Intervention Category Minor Interventions: Agitation / anxiety - evaluation and management  [name],[name] 10/11/2016, 2:32 AM

## 2016-10-11 NOTE — Progress Notes (Signed)
[place] Progress Note Patient Name: Porter Bustillos DOB: 01/30/1951 MRN: [id number]   Date of Service  10/11/2016  HPI/Events of Note  Delirium - patient found on floor.   eICU Interventions  Will restart Precedex IV infusion. Titrate to RASS = 0.     Intervention Category Major Interventions: Delirium, psychosis, severe agitation - evaluation and management  [name],[name] 10/11/2016, 1:29 AM

## 2016-10-11 NOTE — Progress Notes (Signed)
PULMONARY / CRITICAL CARE MEDICINE   Name: Barry Mccoy MRN: [id number] DOB: [birth year]    ADMISSION DATE:  10/01/2016 CONSULTATION DATE:  1/23  REFERRING MD:  Reesa Chew, MD ([facility])  CHIEF COMPLAINT:  Acute Encephalopathy and intermittent hypoxia   SUBJECTIVE:  Pt sundowns.  He was briefly placed on precedex drip last night which was weaned off as well.  Had afib/SVT/sinus tachycardia >> placed on amio IV >> currently was sinus tachycardia 110-120  More awake this am. On and off confusion.    VITAL SIGNS: BP 139/80   Pulse (!) 104   Temp 98.3 F (36.8 C) (Oral)   Resp (!) 23   Ht 6\' 2"  (1.88 m)   Wt 108 kg (238 lb 1.6 oz)   SpO2 100%   BMI 30.57 kg/m   VENTILATOR SETTINGS:    INTAKE / OUTPUT: I/O last 3 completed shifts: In: 2087.3 [I.V.:927.3; Other:120; NG/GT:530; IV Piggyback:510] Out: [id number] [Urine:3775]  PHYSICAL EXAMINATION: Blood pressure (!) 111/44, pulse 71, temperature 99.8 F (37.7 C), temperature source Oral, resp. rate 14, height 6\' 2"  (1.88 m), weight 239 lb 13.8 oz (108.8 kg), SpO2 98 %. General Apperance: NAD. Awake, oriented to place and person.  In and out of confusion.  HEENT: Normocephalic, atraumatic, anicteric sclera Neck: Supple, trachea midline Lungs: Good ae.  No wheezes. Breathing comfortably on room air. Crackles at bases.  Heart: Regular rate and rhythm, no murmur/rub/gallop Abdomen: Soft, nontender, nondistended, no rebound/guarding Extremities: Warm and well perfused, trace  edema Skin: No rashes or lesions Neurologic: CN grossly intact. (-) lateralizing signs.    LABS:  BMET  Recent Labs Lab 10/09/16 0345 10/10/16 0509 10/11/16 0312  NA 144 143 142  K 3.6 3.5 3.0*  CL 108 112* 112*  CO2 29 27 24   BUN 15 17 14   CREATININE 0.74 0.71 0.82  GLUCOSE 135* 132* 129*    Electrolytes  Recent Labs Lab 10/09/16 0345 10/10/16 0509 10/10/16 1209 10/11/16 0312  CALCIUM 8.5* 8.3*  --  8.3*  MG 2.1  --  2.1 1.9  PHOS 3.0  --   3.0 2.0*    CBC  Recent Labs Lab 10/09/16 0345 10/10/16 0509 10/11/16 0312  WBC 6.6 5.8 7.8  HGB 8.0* 9.0* 9.1*  HCT 26.8* 30.2* 30.7*  PLT 128* 136* 147*   Sepsis Markers  Recent Labs Lab 10/05/16 0214  PROCALCITON 0.71    Liver Enzymes  Recent Labs Lab 10/05/16 0214 10/08/16 1231  AST 37 34  ALT 35 32  ALKPHOS 55 56  BILITOT 1.2 0.9  ALBUMIN 2.3* 2.5*    Cardiac Enzymes No results for input(s): TROPONINI, PROBNP in the last 168 hours.  Glucose  Recent Labs Lab 10/10/16 1539 10/11/16 0039 10/11/16 0508 10/11/16 0838 10/11/16 1207 10/11/16 1618  GLUCAP 85 86 123* 108* 98 112*    STUDIES:  CT head 1/23>> No acute intracranial hemorrhage. Mild chronic microvascular ischemic changes. CT abd/pelvis 1/23>>Significant inflammatory changes within the left upper quadrant at the tail the pancreas and splenic flexure of the colon. Circumferential colon wall thickening, and small volume of free fluid. Cholelithiasis with circumferential fluid/gallbladder wall Thickening. CXR 1/23>> Left lower lobe consolidation  EGD 1/25>> Portal hypertensive gastropathy versus inflammatory gastritis. EEG 1/25>> severe diffuse generalized slowing, no seizures  CULTURES: UCx 1/23>>multiple species  ANTIBIOTICS: Zosyn 1/23>>1/30  SIGNIFICANT EVENTS: 1/22 admit, transfused 3 u pRBC 1/23 intubated, transfused 2 u pRBC 1/25 underwent EEG and EGD 1/28 Extubated  LINES/TUBES: ETT 1/23>>1/28 RIJ CVC 1/25>>  Cortrak 1/29>>  DISCUSSION: 66 year old man presented with near syncope, dyspnea on exertion found to have symptomatic anemia. Source seems to be GI w/ findings of melena. He was transfused and was hemodynamically stable but developed acute delirium and agitation requiring a total of 12mg  of ativan. This resulted in somnolence and acute hypoxic respiratory failure with episodes of apnea. He was transferred to the ICU.  ASSESSMENT / PLAN:   1. S/P resp failure 2/2  unable to protect airway with meds he received for agitation + asp pna. Completed 7d of zosyn.  Extubated on 1/28. Keep o2 sats >88%. Currently protecting airway.  On and off drowsiness related to agitation meds.  More "awake" today. Protecting his airway.   2. Delirium 2/2 Depression + Anxiety + Etoh abuse + Baclofen withdrawal. This has been his main issue this hospitalization.  - Precedex drip weaned off this am  - Pt is on seroquel at 200 mg BID. With his SVT, I am concerned it is related to Seroquel.  Will cut the dose to 100 mg BID on 2/1 and plan on weaning off if possible.  He was just started on it this week.  - Will increase Clonazepam to 1 mg TID.  - Cont fentanyl prn.  There might be opiate withdrawal as he was on a lot of fentanyl while intubated - Cont prn haldol for now.  Per RN, haldol made him worse at some point.  - Cont prn Ativan for now as well - Cont baclofen, Thiamine, Folate, Zoloft  - Cont keppra (not sure if this is related to nerves/headaches) - needs psychiatry consult once more lucid  3. S/P Pulmonary edema, S/P lasix. Holding off on diuresis for now. Keeping euvolemic.   4. SVT. In and out of sinus tachycardia on 1/31.  Likely related to agitation.  Started on amiodarone drip on 2/1 but he is now sinus.  Will d/c IV amiodarone. May need lopressor prn  5. S/P anemia related to portal gastropathy. S/P EGD. Hb and hct stable. No further transfusion done.   6. AKI. Improved.  Keeping even.   Family : I extensively d/w daughter re: plans.  Pt likely will need inpt psychiatry admission once discharged from [facility].    Plan is to transfer to SDU.  TRH will be primary on 2/2 and PCCM off unless with resp issues.  Dr. Dedra Skeens Divine aware of pt.   Monica Becton, MD 10/11/2016, 5:18 PM [place] [facility] Pager [phone] After 3 pm or if no answer, call [phone]

## 2016-10-11 NOTE — NC FL2 (Addendum)
[place] LEVEL OF CARE SCREENING TOOL     IDENTIFICATION  Patient Name: Barry Mccoy Birthdate: [birth year] Sex: male Admission Date (Current Location): 10/01/2016  [facility] and [place] Number:  Herbalist and Address:  [facility], [place] [address] [place], [place] [address]      Provider Number: [id number]  Attending Physician Name and Address:  Marshell Garfinkel, MD  Relative Name and Phone Number:  Lorenda Hatchet (Daughter) [phone]    Current Level of Care: Hospital Recommended Level of Care: [place] Prior Approval Number:    Date Approved/Denied:   PASRR Number:    Discharge Plan: SNF    Current Diagnoses: Patient Active Problem List   Diagnosis Date Noted  . Acute encephalopathy   . Acute respiratory failure ([place])   . Symptomatic anemia 10/01/2016  . Acute GI bleeding 10/01/2016    Orientation RESPIRATION BLADDER Height & Weight     Self, Time  O2 (2L Nasal Cannula) Continent, External catheter Weight: 238 lb 1.6 oz (108 kg) Height:  6\' 2"  (188 cm)  BEHAVIORAL SYMPTOMS/MOOD NEUROLOGICAL BOWEL NUTRITION STATUS      Incontinent (Rectal Pouch) Diet (Dysphagia 1 (Puree) thin liquid)  AMBULATORY STATUS COMMUNICATION OF NEEDS Skin   Extensive Assist Verbally Normal                       Personal Care Assistance Level of Assistance  Bathing, Dressing, Feeding Bathing Assistance: Maximum assistance Feeding assistance: Independent Dressing Assistance: Maximum assistance     Functional Limitations Info  Sight, Hearing, Speech Sight Info: Adequate Hearing Info: Adequate Speech Info: Adequate    SPECIAL CARE FACTORS FREQUENCY  PT (By licensed PT), OT (By licensed OT)  min 3x/week  min 3/week                  Contractures Contractures Info: Not present    Additional Factors Info  Code Status, Allergies, Psychotropic Code Status Info: FULL Allergies Info: Shellfish  Allergy Psychotropic Info: Klonopin; Seroquel; Zoloft; Valium; Haldol         Current Medications (10/11/2016):  This is the current hospital active medication list Current Facility-Administered Medications  Medication Dose Route Frequency Provider Last Rate Last Dose  . amiodarone (NEXTERONE PREMIX) 360-4.14 MG/200ML-% (1.8 mg/mL) IV infusion  30 mg/hr Intravenous Continuous Flora Lipps, MD 16.7 mL/hr at 10/11/16 0929 30 mg/hr at 10/11/16 0929  . baclofen (LIORESAL) tablet 40 mg  40 mg Per Tube QHS Milagros Loll, MD   40 mg at 10/10/16 2100  . clonazePAM (KLONOPIN) disintegrating tablet 1 mg  1 mg Oral BID Jake Church Masters, RPH   1 mg at 10/11/16 1012  . dexmedetomidine (PRECEDEX) 200 MCG/50ML (4 mcg/mL) infusion  0.2-1.5 mcg/kg/hr Intravenous Titrated Anders Simmonds, MD   Stopped at 10/11/16 0500  . docusate (COLACE) 50 MG/5ML liquid 100 mg  100 mg Oral BID PRN Milagros Loll, MD      . feeding supplement (PRO-STAT SUGAR FREE 64) liquid 60 mL  60 mL Per Tube TID Praveen Mannam, MD   60 mL at 10/10/16 1618  . feeding supplement (VITAL HIGH PROTEIN) liquid 1,000 mL  1,000 mL Per Tube Q24H Milagros Loll, MD   1,000 mL at 10/10/16 1321  . fentaNYL (SUBLIMAZE) injection 12.5-25 mcg  12.5-25 mcg Intravenous Q2H PRN Milagros Loll, MD   25 mcg at 10/11/16 [phone]  . folic acid (FOLVITE) tablet 1 mg  1 mg Oral Daily Praveen Mannam, MD   1 mg at 10/11/16 1008  . haloperidol lactate (HALDOL) injection 5 mg  5 mg Intravenous Q6H PRN Milagros Loll, MD   5 mg at 10/09/16 1511  . levETIRAcetam (KEPPRA XR) 24 hr tablet 500 mg  500 mg Oral Daily Flora Lipps, MD   500 mg at 10/11/16 1008  . LORazepam (ATIVAN) injection 1 mg  1 mg Intravenous Q3H PRN Jose Shirl Harris, MD   1 mg at 10/11/16 [id number]  . MEDLINE mouth rinse  15 mL Mouth Rinse BID Praveen Mannam, MD   15 mL at 10/11/16 1028  . nicotine (NICODERM CQ - dosed in mg/24 hours) patch 14 mg  14 mg Transdermal Daily Praveen Mannam, MD   14 mg at  10/11/16 1014  . pantoprazole (PROTONIX) injection 40 mg  40 mg Intravenous Q12H Praveen Mannam, MD   40 mg at 10/11/16 1015  . potassium phosphate 30 mmol in dextrose 5 % 500 mL infusion  30 mmol Intravenous Once Anders Simmonds, MD   30 mmol at 10/11/16 0524  . QUEtiapine (SEROQUEL) tablet 200 mg  200 mg Per Tube BID Milagros Loll, MD   200 mg at 10/11/16 1008  . sennosides (SENOKOT) 8.8 MG/5ML syrup 5 mL  5 mL Oral QHS PRN Milagros Loll, MD      . sertraline (ZOLOFT) tablet 50 mg  50 mg Per Tube Daily Jake Church Masters, RPH   50 mg at 10/10/16 1253  . thiamine (VITAMIN B-1) tablet 100 mg  100 mg Per Tube Daily Milagros Loll, MD   100 mg at 10/11/16 1006     Discharge Medications: Please see discharge summary for a list of discharge medications.  Relevant Imaging Results:  Relevant Lab Results:   Additional Information  SS# [id number]  Lind Covert, LCSW

## 2016-10-11 NOTE — Evaluation (Signed)
Clinical/Bedside Swallow Evaluation Patient Details  Name: Barry Mccoy MRN: [id number] Date of Birth: 08/08/1951  Today's Date: 10/11/2016 Time: SLP Start Time (ACUTE ONLY): 1015 SLP Stop Time (ACUTE ONLY): 1027 SLP Time Calculation (min) (ACUTE ONLY): 12 min  Past Medical History:  Past Medical History:  Diagnosis Date  . Muscle spasm    Past Surgical History:  Past Surgical History:  Procedure Laterality Date  . ELBOW SURGERY    . ESOPHAGOGASTRODUODENOSCOPY N/A 10/04/2016   Procedure: ESOPHAGOGASTRODUODENOSCOPY (EGD);  Surgeon: Carol Ada, MD;  Location: [facility];  Service: Gastroenterology;  Laterality: N/A;   HPI:  66 year old male admitted 10/01/16 due to near syncope, AMS, acute encephalopathy and intermittent hypoxia. Pt intubated 1/24-28/18 due to respiratory distress. UGI 10/04/16 - hypertensive gastropathy vs inflammatory gastritis. CXR 10/07/16 - bilateral airspace disease. PMH unremarkable.   Assessment / Plan / Recommendation Clinical Impression  Patient presents with a cognitively based oral dysphagia characterized by perseverative mastication of soft solids due to poor sustained attention and bolus awareness. Otherwise, patient with timely oral transit and no overt s/s of aspiration with pos provided. Aspiration risk remains moderate given 5 day intubation and AMS. Will f/u for readiness to advance diet and aspiration precautions.     Aspiration Risk  Moderate aspiration risk    Diet Recommendation Dysphagia 1 (Puree);Thin liquid   Liquid Administration via: Cup;Straw Medication Administration: Whole meds with liquid Supervision: Patient able to self feed;Full supervision/cueing for compensatory strategies Compensations: Slow rate;Small sips/bites Postural Changes: Seated upright at 90 degrees    Other  Recommendations Oral Care Recommendations: Oral care BID   Follow up Recommendations None      Frequency and Duration min 2x/week  1 week       Prognosis  Prognosis for Safe Diet Advancement: Good Barriers to Reach Goals: Cognitive deficits      Swallow Study   General HPI: 66 year old male admitted 10/01/16 due to near syncope, AMS, acute encephalopathy and intermittent hypoxia. Pt intubated 1/24-28/18 due to respiratory distress. UGI 10/04/16 - hypertensive gastropathy vs inflammatory gastritis. CXR 10/07/16 - bilateral airspace disease. PMH unremarkable. Type of Study: Bedside Swallow Evaluation Previous Swallow Assessment: none Diet Prior to this Study: Dysphagia 3 (soft);Thin liquids Temperature Spikes Noted: No Respiratory Status: Nasal cannula History of Recent Intubation: Yes Length of Intubations (days): 5 days Date extubated: 10/07/16 Behavior/Cognition: Alert;Confused;Distractible;Requires cueing Oral Cavity Assessment: Within Functional Limits Oral Care Completed by SLP: Recent completion by staff Oral Cavity - Dentition: Dentures, not available (per grandaughter, removes to eat) Vision: Functional for self-feeding Self-Feeding Abilities: Needs assist Patient Positioning: Upright in bed Baseline Vocal Quality: Normal Volitional Cough: Strong Volitional Swallow: Able to elicit    Oral/Motor/Sensory Function Overall Oral Motor/Sensory Function: Within functional limits   Ice Chips Ice chips: Not tested   Thin Liquid Thin Liquid: Within functional limits Presentation: Straw    Nectar Thick Nectar Thick Liquid: Not tested   Honey Thick Honey Thick Liquid: Not tested   Puree Puree: Within functional limits Presentation: Spoon   Solid   GO   Solid: Impaired Presentation: Spoon Oral Phase Impairments: Impaired mastication Oral Phase Functional Implications: Prolonged oral transit;Other (comment) (perseverative mastication of soft solids)       Melva Faux MA, CCC-SLP ([phone]  Cobin Cadavid [name] 10/11/2016,10:31 AM

## 2016-10-11 NOTE — Progress Notes (Signed)
Staff responded to patients bed alarm.  Upon entering room, staff found patient at the foot of the bed with lower legs on the ground.  Patients posey belt was still attached but IV had been pulled out and nasal canula removed.  Staff quickly moved patient back to the top of the bed and notified MD.  Vitals obtained and neuro assessment completed, see flowsheets  Implementing new orders per MD.

## 2016-10-11 NOTE — Progress Notes (Signed)
Nutrition Follow-up   DOCUMENTATION CODES:   Obesity unspecified  INTERVENTION:    Ensure Enlive PO BID, each supplement provides 350 kcal and 20 grams of protein  NUTRITION DIAGNOSIS:   Inadequate oral intake related to dysphagia as evidenced by meal completion < 25%.  Ongoing  GOAL:   Patient will meet greater than or equal to 90% of their needs   Unmet  MONITOR:   PO intake, Supplement acceptance, Labs, I & O's  ASSESSMENT:   66 year old man who presented to the hospital on 1/22 with near syncope, dyspnea on exertion found to have symptomatic anemia. Source seems to be GI w/ findings of melena. He developed acute hypoxic respiratory failure requiring transfer to the ICU and intubation 1/23.  Discussed patient in ICU rounds and with RN today. Extubated 1/28. TF off. Diet advanced to dysphagia 1 with thin liquids on 1/31.  Consuming 0-10% of meals. Labs reviewed: Potassium 3, phosphorus 2 Medications reviewed.  Diet Order:  DIET - DYS 1 Room service appropriate? Yes; Fluid consistency: Thin  Skin:  Reviewed, no issues  Last BM:  1/31  Height:   Ht Readings from Last 1 Encounters:  10/02/16 6\' 2"  (1.88 m)    Weight:   Wt Readings from Last 1 Encounters:  10/11/16 238 lb 1.6 oz (108 kg)   Ideal Body Weight:  86.4 kg  BMI:  Body mass index is 30.57 kg/m.  Estimated Nutritional Needs:   Kcal:  2100-2300  Protein:  110-130 gm  Fluid:  2.1-2.3 L  EDUCATION NEEDS:   No education needs identified at this time  Molli Barrows, [place], [place], [place] Pager [phone] After Hours Pager [phone]

## 2016-10-11 NOTE — Progress Notes (Signed)
  Echocardiogram 2D Echocardiogram has been performed.  Bobbye Charleston 10/11/2016, 12:06 PM

## 2016-10-11 NOTE — Clinical Social Work Note (Signed)
Clinical Social Work Assessment  Patient Details  Name: Barry Mccoy MRN: [id number] Date of Birth: 06-24-1951  Date of referral:  10/11/16               Reason for consult:  Facility Placement                Permission sought to share information with:  Family Supports Permission granted to share information::  Yes, Verbal Permission Granted  Name::     Lorenda Hatchet (Daughter) [phone]  Agency::     Relationship::     Contact Information:     Housing/Transportation Living arrangements for the past 2 months:  Single Family Home Source of Information:  Patient, Adult Children Patient Interpreter Needed:  None Criminal Activity/Legal Involvement Pertinent to Current Situation/Hospitalization:  No - Comment as needed Significant Relationships:  Adult Children, Pets, Other Family Members Lives with:  Self Do you feel safe going back to the place where you live?  No Need for family participation in patient care:  Yes (Comment)  Care giving concerns:  Patient presently lives home alone. Per Patient's daughter, Patient's daughter lives in [place], [place] and Patient lacks care giver support in the home at discharge.  Social Worker assessment / plan:  CSW engaged with Patient, Patient's granddaughter, and daughter at bedside. Patient's other daughter was contacted via T/C. CSW introduced self, role of CSW, and discussed medical team's recommendation of skilled nursing facility placement at discharge for short term rehab. Patient disoriented and unable to engage in conversation. Patient's daughters report that ultimately, the plan is for the Patient to move to his daughter's home in [place]. Patient's daughter reports that she is in the process of moving his belongings to his "In-Law Suite" at her home now. Patient's daughters are agreeable to short term rehab to get Patient back to his baseline level of functioning prior to returning to his home setting. CSW explained SNF referral process.  Patient's daughter in agreement to referrals in [facility] but would also like to look at skilled nursing facilities in [place], [place] as well. Both of Patient's daughter's in agreement. CSW to facilitate transfer to SNF at discharge. CSW to meet with both daughters again tomorrow 10/12/2016 at [place] also inquired about possible substance abuse. Per Patient's daughters, Patient drank alcohol but they do not know how often nor how much Patient may have been drinking.   Employment status:  Retired Forensic scientist:  Medicare PT Recommendations:  [place] / Referral to community resources:  [place]  Patient/Family's Response to care:  Patient's daughter's report appreciation for care received at this time. Patient reports no concerns.   Patient/Family's Understanding of and Emotional Response to Diagnosis, Current Treatment, and Prognosis: Patient's daughters are agreeable to short term rehab to get Patient back to his baseline level of functioning prior to returning to his home setting.   Emotional Assessment Appearance:  Appears stated age Attitude/Demeanor/Rapport:   (Cooperative; Agitated; Anxious; Restless) Affect (typically observed):  Agitated, Anxious, Restless Orientation:  Oriented to Self, Oriented to  Time Alcohol / Substance use:  Alcohol Use Psych involvement (Current and /or in the community):  Yes (Comment) (Psych Consult has been placed- pending evaulation )  Discharge Needs  Concerns to be addressed:  Discharge Planning Concerns, Care Coordination Readmission within the last 30 days:  No Current discharge risk:  Physical Impairment Barriers to Discharge:  Continued Medical Work up   Lind Covert, LCSW  10/11/2016, 12:43 PM

## 2016-10-11 NOTE — Progress Notes (Signed)
[facility] Progress Note Patient Name: Barry Mccoy DOB: August 14, 1951 MRN: [id number]   Date of Service  10/11/2016  HPI/Events of Note  K+ = 3.0, PO4--- = 2.0 and Creatinine = 0.82.  eICU Interventions  Will replace K+ and PO4---.     Intervention Category Major Interventions: Electrolyte abnormality - evaluation and management  [name],[name] 10/11/2016, 4:49 AM

## 2016-10-12 DIAGNOSIS — E876 Hypokalemia: Secondary | ICD-10-CM

## 2016-10-12 DIAGNOSIS — D638 Anemia in other chronic diseases classified elsewhere: Secondary | ICD-10-CM

## 2016-10-12 LAB — BASIC METABOLIC PANEL
ANION GAP: 11 (ref 5–15)
BUN: 9 mg/dL (ref 6–20)
CO2: 22 mmol/L (ref 22–32)
Calcium: 8.6 mg/dL — ABNORMAL LOW (ref 8.9–10.3)
Chloride: 108 mmol/L (ref 101–111)
Creatinine, Ser: 0.87 mg/dL (ref 0.61–1.24)
GFR calc non Af Amer: >60 mL/min (ref >60)
Glucose, Bld: 117 mg/dL — ABNORMAL HIGH (ref 65–99)
Potassium: 3.2 mmol/L — ABNORMAL LOW (ref 3.5–5.1)
SODIUM: 141 mmol/L (ref 135–145)

## 2016-10-12 LAB — GLUCOSE, CAPILLARY
GLUCOSE-CAPILLARY: 104 mg/dL — AB (ref 65–99)
GLUCOSE-CAPILLARY: 118 mg/dL — AB (ref 65–99)
GLUCOSE-CAPILLARY: 113 mg/dL — AB (ref 65–99)
Glucose-Capillary: 109 mg/dL — ABNORMAL HIGH (ref 65–99)
Glucose-Capillary: 114 mg/dL — ABNORMAL HIGH (ref 65–99)
Glucose-Capillary: 116 mg/dL — ABNORMAL HIGH (ref 65–99)

## 2016-10-12 LAB — CBC
HCT: 32.5 % — ABNORMAL LOW (ref 39.0–52.0)
HEMOGLOBIN: 9.7 g/dL — AB (ref 13.0–17.0)
MCH: 24.4 pg — AB (ref 26.0–34.0)
MCHC: 29.8 g/dL — ABNORMAL LOW (ref 30.0–36.0)
MCV: 81.7 fL (ref 78.0–100.0)
PLATELETS: 160 10*3/uL (ref 150–400)
RBC: 3.98 MIL/uL — AB (ref 4.22–5.81)
RDW: 19.4 % — ABNORMAL HIGH (ref 11.5–15.5)
WBC: 6.8 10*3/uL (ref 4.0–10.5)

## 2016-10-12 LAB — MAGNESIUM: MAGNESIUM: 2 mg/dL (ref 1.7–2.4)

## 2016-10-12 LAB — PHOSPHORUS: PHOSPHORUS: 3.6 mg/dL (ref 2.5–4.6)

## 2016-10-12 MED ORDER — POTASSIUM CHLORIDE CRYS ER 20 MEQ PO TBCR
40.0000 meq | EXTENDED_RELEASE_TABLET | ORAL | Status: AC
  Administered 2016-10-12 (×2): 40 meq via ORAL
  Filled 2016-10-12 (×2): qty 2

## 2016-10-12 MED ORDER — ONDANSETRON HCL 4 MG/2ML IJ SOLN
4.0000 mg | Freq: Once | INTRAMUSCULAR | Status: AC
  Administered 2016-10-12: 4 mg via INTRAVENOUS
  Filled 2016-10-12: qty 2

## 2016-10-12 NOTE — Progress Notes (Signed)
SLP Cancellation Note  Patient Details Name: Barry Mccoy MRN: [id number] DOB: Aug 22, 1951   Cancelled treatment:       Reason Eval/Treat Not Completed: Fatigue/lethargy limiting ability to participate  [place], CCC-SLP ([id number] 10/12/2016, 12:16 PM

## 2016-10-12 NOTE — Progress Notes (Addendum)
Patient ID: Barry Mccoy, male   DOB: 1951/05/31, 66 y.o.   MRN: [id number]  PROGRESS NOTE    Barry Mccoy  [name] DOB: 31-Aug-1951 DOA: 10/01/2016  PCP: No PCP Per Patient   Brief Narrative:  66 year old man with past medical history of migraine headaches (takes keppra), depression who presented to [facility] 10/01/16 after nearly passing out while out shopping. Pt also reported exertional dyspnea, black stools. He was hypotensive in ED and it was thought that this is due to possible GI bleed. He was transfused total of 5 units PRBC during this admission. He developed acute delirium and agitation requiring total of 12 mg ativan which resulted in somnolence and acute respiratory failure with hypoxia and apnea. He was intubated from 1/23 - 1/28. He also required brief precedex infusion. He also had afib/SVT/sinus tachycardia >> placed on amio IV and then converted to sinus tachycardia.   SIGNIFICANT EVENTS: 1/22 admit, transfused 3 u pRBC 1/23 intubated, transfused 2 u pRBC 1/25 underwent EEG and EGD 1/28 Extubated   Assessment & Plan:   Acute respiratory failure with hypoxia and inability to protect airways / Aspiration pneumonitis - Somnolence due to multiple ativan doses for agitation, delirium - Intubated form 1/23 through 1/28 - Stable resp and mental status at this time - Has received total of 7 days of zosyn  - Continue oxygen support via [place] to keep O2 sats above 88%  Delirium secondary to depression / anxiety / alcohol abuse and possible baclofen withdrawal - Precedex drip weaned off 2/1 - Pt is on seroquel at 200 mg BID but concern that this med may have cause SVT. Seroquel dose cut to 100 mg BID on 2/1  - Also on Zoloft 50 mg daily  - Increased clonazepam to 1mg  TID - On PRN ativan, haldol - His mental status is better but intermittently confused - Once more lucid he will need psych consult   Hypokalemia - Supplemented - Follow up BMP in am  SVT - Lkely related to  agitation.   - Started on amiodarone drip on 2/1 but he is now sinus.   - D/C'ed amio 2/1  Anemia of chronic disease / Acute upper GI bleed - Likely related to portal gastropathy.  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  EEG 1/25>> severe diffuse generalized slowing, no seizures  ETT 1/23>>1/28  RIJ CVC 1/25>>  Antimicrobials:   Zosyn 1/23 --> 1/30    Subjective: No overnight events.   Objective: Vitals:   10/12/16 0600 10/12/16 0700 10/12/16 0800 10/12/16 0853  BP: (!) 149/81 (!) 164/85 (!) 143/89   Pulse: 92 97 96   Resp: (!) 24 (!) 27 (!) 22   Temp:    98.4 F (36.9 C)  TempSrc:    Oral  SpO2: 95% 94% 95%   Weight:      Height:        Intake/Output Summary (Last 24 hours) at 10/12/16 0910 Last data filed at 10/12/16 0500  Gross per 24 hour  Intake           449.05 ml  Output  825 ml  Net          -375.95 ml   Filed Weights   10/10/16 0500 10/11/16 0415 10/12/16 0400  Weight: 108 kg (238 lb 1.6 oz) 108 kg (238 lb 1.6 oz) 102.9 kg (226 lb 13.7 oz)    Examination:  General exam: Appears calm and comfortable  Respiratory system: Clear to auscultation. Respiratory effort normal. Cardiovascular system: S1 & S2 heard, Rate controlled  Gastrointestinal system: Abdomen is nondistended, soft and nontender. No organomegaly or masses felt. Normal bowel sounds heard. Central nervous system: No focal neurological deficits. Disoriented  Extremities: Symmetric 5 x 5 power. Skin: No rashes, lesions or ulcers Psychiatry: Judgement and insight appear normal. Mood & affect appropriate.   Data Reviewed: I have  personally reviewed following labs and imaging studies  CBC:  Recent Labs Lab 10/08/16 0314 10/09/16 0345 10/10/16 0509 10/11/16 0312 10/12/16 0339  WBC 7.0 6.6 5.8 7.8 6.8  HGB 8.0* 8.0* 9.0* 9.1* 9.7*  HCT 26.3* 26.8* 30.2* 30.7* 32.5*  MCV 84.8 84.5 83.9 82.7 81.7  PLT 147* 128* 136* 147* 0000000   Basic Metabolic Panel:  Recent Labs Lab 10/08/16 0314 10/08/16 1957 10/09/16 0345 10/10/16 0509 10/10/16 1209 10/11/16 0312 10/12/16 0339  NA 144 144 144 143  --  142 141  K 2.8* 4.1 3.6 3.5  --  3.0* 3.2*  CL 104 109 108 112*  --  112* 108  CO2 28 30 29 27   --  24 22  GLUCOSE 154* 130* 135* 132*  --  129* 117*  BUN 11 13 15 17   --  14 9  CREATININE 0.84 0.80 0.74 0.71  --  0.82 0.87  CALCIUM 8.5* 8.4* 8.5* 8.3*  --  8.3* 8.6*  MG 1.8  --  2.1  --  2.1 1.9 2.0  PHOS 3.0  --  3.0  --  3.0 2.0* 3.6   GFR: Estimated Creatinine Clearance: 106.9 mL/min (by C-G formula based on SCr of 0.87 mg/dL). Liver Function Tests:  Recent Labs Lab 10/08/16 1231  AST 34  ALT 32  ALKPHOS 56  BILITOT 0.9  PROT 5.5*  ALBUMIN 2.5*   No results for input(s): LIPASE, AMYLASE in the last 168 hours.  Recent Labs Lab 10/08/16 1230  AMMONIA 18   Coagulation Profile: No results for input(s): INR, PROTIME in the last 168 hours. Cardiac Enzymes: No results for input(s): CKTOTAL, CKMB, CKMBINDEX, TROPONINI in the last 168 hours. BNP (last 3 results) No results for input(s): PROBNP in the last 8760 hours. HbA1C: No results for input(s): HGBA1C in the last 72 hours. CBG:  Recent Labs Lab 10/11/16 1207 10/11/16 1618 10/11/16 2102 10/12/16 0103 10/12/16 0445  GLUCAP 98 112* 110* 104* 109*   Lipid Profile: No results for input(s): CHOL, HDL, LDLCALC, TRIG, CHOLHDL, LDLDIRECT in the last 72 hours. Thyroid Function Tests: No results for input(s): TSH, T4TOTAL, FREET4, T3FREE, THYROIDAB in the last 72 hours. Anemia Panel: No results for input(s): VITAMINB12, FOLATE, FERRITIN,  TIBC, IRON, RETICCTPCT in the last 72 hours. Urine analysis:    Component Value Date/Time   COLORURINE YELLOW 10/02/2016 0100   APPEARANCEUR CLEAR 10/02/2016 0100   LABSPEC >1.046 (H) 10/02/2016 0100   PHURINE 5.0 10/02/2016 0100   GLUCOSEU NEGATIVE 10/02/2016 0100   HGBUR NEGATIVE 10/02/2016 0100   BILIRUBINUR NEGATIVE 10/02/2016 0100   KETONESUR NEGATIVE 10/02/2016 0100   PROTEINUR NEGATIVE 10/02/2016 0100   NITRITE NEGATIVE 10/02/2016 0100   LEUKOCYTESUR NEGATIVE 10/02/2016 0100  Sepsis Labs: @LABRCNTIP (procalcitonin:4,lacticidven:4)   Recent Results (from the past 240 hour(s))  MRSA PCR Screening     Status: None   Collection Time: 10/02/16  4:34 PM  Result Value Ref Range Status   MRSA by PCR NEGATIVE NEGATIVE Final      Radiology Studies: Dg Chest Port 1 View Result Date: 10/11/2016  Improved bibasilar atelectasis or pneumonia. Persistent pulmonary interstitial edema and pulmonary vascular congestion consistent with CHF.   Dg Abd Portable 1v Result Date: 10/08/2016 Feeding tube tip is in the distal descending duodenum.     Scheduled Meds: . baclofen  40 mg Oral QHS  . clonazepam  1 mg Oral TID  . feeding supplement (ENSURE ENLIVE)  237 mL Oral BID BM  . folic acid  1 mg Oral Daily  . levETIRAcetam  500 mg Oral Daily  . mouth rinse  15 mL Mouth Rinse BID  . nicotine  14 mg Transdermal Daily  . pantoprazole  40 mg Oral QHS  . QUEtiapine  100 mg Oral BID  . sertraline  50 mg Oral Daily  . thiamine  100 mg Oral Daily   Continuous Infusions: . dexmedetomidine Stopped (10/11/16 0500)     LOS: 11 days    Time spent: 25 minutes  Greater than 50% of the time spent on counseling and coordinating the care.   Leisa Lenz, MD [facility] Pager [phone]  If 7PM-7AM, please contact night-coverage [email or web address] Password [email or web address] 10/12/2016, 9:10 AM

## 2016-10-12 NOTE — Progress Notes (Signed)
Physical Therapy Treatment Patient Details Name: Barry Mccoy MRN: [id number] DOB: 30-Aug-1951 Today's Date: 10/12/2016    History of Present Illness Pt is a 66 yo male admitted for syncope and GI bleeding. Pt found to have Acute Encephalopathy and intermittent hypoxia. VDRF 1/23-1/28. Pt with know ETOH abuse.    PT Comments    Pt remains confused, inattentive, with decreased safety awareness and functional mobility. Pt unable to fully stand or attempt standing but was able to transfer OOB to chair today and perform HEP with constant multimodal cues. Will continue to follow to decrease burden of care.   HR 112-135 with peaks as high as 158 non-sustained with activity  Follow Up Recommendations  SNF;Supervision/Assistance - 24 hour     Equipment Recommendations       Recommendations for Other Services       Precautions / Restrictions Precautions Precautions: Fall Precaution Comments: posey, lethargic from meds    Mobility  Bed Mobility Overal bed mobility: Needs Assistance Bed Mobility: Supine to Sit Rolling: Min assist   Supine to sit: Max assist     General bed mobility comments: pt lethargic on arrival with assist to have pt roll his head and trunk off of rail. Assist to bring legs to EOb and elevate trunk to sitting. Once in sitting pt with tendency for very anterior lean at times propping on knees and blocked to prevent fall throughout  Transfers Overall transfer level: Needs assistance   Transfers: Sit to/from [employer] Transfers Sit to Stand: Max assist;+2 physical assistance   Squat pivot transfers: Max assist;+2 safety/equipment;+2 physical assistance     General transfer comment: max assist with cues for hand placement and safety with knees blocked to attempt standing x 4. Pt able to clear bed but unable to achieve fully upright with crouched posture maintained. Attempted with RW present x 1 with decreased success. Longest standing trial grossly 40 sec  with bil UE support. For pivot hand over hand placement to chair with assist of belt and max assist to rotate pelvis fully after rise  Ambulation/Gait                 Stairs            Wheelchair Mobility    Modified Rankin (Stroke Patients Only)       Balance Overall balance assessment: Needs assistance   Sitting balance-Leahy Scale: Poor       Standing balance-Leahy Scale: Poor                      Cognition Arousal/Alertness: Lethargic Behavior During Therapy: Flat affect Overall Cognitive Status: Impaired/Different from baseline Area of Impairment: Following commands;Problem solving;Safety/judgement;Memory;Attention;Orientation Orientation Level: Time;Place Current Attention Level: Focused Memory: Decreased short-term memory Following Commands: Follows one step commands with increased time;Follows one step commands inconsistently Safety/Judgement: Decreased awareness of safety;Decreased awareness of deficits   Problem Solving: Slow processing;Difficulty sequencing;Requires verbal cues;Requires tactile cues;Decreased initiation      Exercises General Exercises - Lower Extremity Long Arc Quad: AROM;Both;Seated;10 reps Hip Flexion/Marching: AROM;AAROM;Right;Left;Seated;10 reps (AAROM on RLE)    General Comments        Pertinent Vitals/Pain Pain Assessment: No/denies pain    Home Living                      Prior Function            PT Goals (current goals can now be found in the care plan section)  Progress towards PT goals: Progressing toward goals (slowly, limited by cognition)    Frequency           PT Plan Current plan remains appropriate    Co-evaluation             End of Session Equipment Utilized During Treatment: Gait belt Activity Tolerance: Patient tolerated treatment well Patient left: in chair;with chair alarm set;with call bell/phone within reach;with restraints reapplied (posey in place)      Time: [id number] PT Time Calculation (min) (ACUTE ONLY): 23 min  Charges:  $Therapeutic Exercise: 8-22 mins $Therapeutic Activity: 8-22 mins                    G Codes:      Griffey Nicasio B Ameyah Bangura 10-29-16, 2:19 PM Elwyn Reach, [place]
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
## 2016-10-12 NOTE — Progress Notes (Signed)
Speech Language Pathology Treatment: Dysphagia  Patient Details Name: Barry Mccoy MRN: [id number] DOB: 1950/11/17 Today's Date: 10/12/2016 Time: [id number] SLP Time Calculation (min) (ACUTE ONLY): 16 min  Assessment / Plan / Recommendation Clinical Impression  Patient lethargic but requesting a drink. Noted emesis x2 earlier this afternoon. Skilled observation complete with intake of thin liquids in which patient able to consume without notable s/s of aspiration, min verbal cues for small single sips. Patient declined solid pos today. Daughter educated regarding swallow evaluation results and rationale for current diet. Prognosis for ability to advance solids good with improved mentation. Overall, current diet appears appropriate. Will continue to f/u.    HPI HPI: 66 year old male admitted 10/01/16 due to near syncope, AMS, acute encephalopathy and intermittent hypoxia. Pt intubated 1/24-28/18 due to respiratory distress. UGI 10/04/16 - hypertensive gastropathy vs inflammatory gastritis. CXR 10/07/16 - bilateral airspace disease. PMH unremarkable.      SLP Plan  Continue with current plan of care     Recommendations  Diet recommendations: Dysphagia 1 (puree);Thin liquid Liquids provided via: Cup;Straw Medication Administration: Whole meds with liquid Supervision: Patient able to self feed;Full supervision/cueing for compensatory strategies Compensations: Slow rate;Small sips/bites Postural Changes and/or Swallow Maneuvers: Seated upright 90 degrees                Oral Care Recommendations: Oral care BID Follow up Recommendations: None Plan: Continue with current plan of care       [place], [place] [phone]   Barry Mccoy [name] 10/12/2016, 3:46 PM

## 2016-10-13 DIAGNOSIS — J690 Pneumonitis due to inhalation of food and vomit: Secondary | ICD-10-CM

## 2016-10-13 LAB — GLUCOSE, CAPILLARY
GLUCOSE-CAPILLARY: 83 mg/dL (ref 65–99)
Glucose-Capillary: 103 mg/dL — ABNORMAL HIGH (ref 65–99)
Glucose-Capillary: 112 mg/dL — ABNORMAL HIGH (ref 65–99)
Glucose-Capillary: 113 mg/dL — ABNORMAL HIGH (ref 65–99)
Glucose-Capillary: 126 mg/dL — ABNORMAL HIGH (ref 65–99)
Glucose-Capillary: 96 mg/dL (ref 65–99)
Glucose-Capillary: 99 mg/dL (ref 65–99)

## 2016-10-13 LAB — CBC
HCT: 30.2 % — ABNORMAL LOW (ref 39.0–52.0)
HEMOGLOBIN: 9.2 g/dL — AB (ref 13.0–17.0)
MCH: 25.3 pg — ABNORMAL LOW (ref 26.0–34.0)
MCHC: 30.5 g/dL (ref 30.0–36.0)
MCV: 83 fL (ref 78.0–100.0)
Platelets: 172 10*3/uL (ref 150–400)
RBC: 3.64 MIL/uL — ABNORMAL LOW (ref 4.22–5.81)
RDW: 19.5 % — AB (ref 11.5–15.5)
WBC: 7.5 10*3/uL (ref 4.0–10.5)

## 2016-10-13 LAB — BASIC METABOLIC PANEL
ANION GAP: 8 (ref 5–15)
BUN: 8 mg/dL (ref 6–20)
CO2: 24 mmol/L (ref 22–32)
Calcium: 8.7 mg/dL — ABNORMAL LOW (ref 8.9–10.3)
Chloride: 108 mmol/L (ref 101–111)
Creatinine, Ser: 0.88 mg/dL (ref 0.61–1.24)
GFR calc Af Amer: >60 mL/min (ref >60)
GLUCOSE: 110 mg/dL — AB (ref 65–99)
POTASSIUM: 4.1 mmol/L (ref 3.5–5.1)
SODIUM: 140 mmol/L (ref 135–145)

## 2016-10-13 LAB — MAGNESIUM: MAGNESIUM: 2.2 mg/dL (ref 1.7–2.4)

## 2016-10-13 LAB — PHOSPHORUS: PHOSPHORUS: 3.7 mg/dL (ref 2.5–4.6)

## 2016-10-13 MED ORDER — LORAZEPAM 2 MG/ML IJ SOLN
1.0000 mg | INTRAMUSCULAR | Status: DC | PRN
  Administered 2016-10-14: 1 mg via INTRAMUSCULAR
  Filled 2016-10-13: qty 1

## 2016-10-13 MED ORDER — CLONAZEPAM 1 MG PO TABS
1.0000 mg | ORAL_TABLET | Freq: Three times a day (TID) | ORAL | Status: DC
  Administered 2016-10-13 – 2016-10-16 (×11): 1 mg via ORAL
  Filled 2016-10-13 (×12): qty 1

## 2016-10-13 NOTE — Progress Notes (Addendum)
Patient ID: Barry Mccoy, male   DOB: 1951/01/09, 66 y.o.   MRN: [id number]  PROGRESS NOTE    Barry Mccoy  [name] DOB: 01-09-1951 DOA: 10/01/2016  PCP: No PCP Per Patient   Brief Narrative:  66 year old man with past medical history of migraine headaches (takes keppra), depression who presented to [facility] 10/01/16 after nearly passing out while out shopping. Pt also reported exertional dyspnea, black stools. He was hypotensive in ED and it was thought that this is due to possible GI bleed. He was transfused total of 5 units PRBC during this admission. He developed acute delirium and agitation requiring total of 12 mg ativan which resulted in somnolence and acute respiratory failure with hypoxia and apnea. He was intubated from 1/23 - 1/28. He also required brief precedex infusion. He also had afib/SVT/sinus tachycardia >> placed on amio IV and then converted to sinus tachycardia.   SIGNIFICANT EVENTS: 1/22 admit, transfused 3 u pRBC 1/23 intubated, transfused 2 u pRBC 1/25 underwent EEG and EGD 1/28 Extubated   Assessment & Plan:   Acute respiratory failure with hypoxia and inability to protect airways / Aspiration pneumonitis - Somnolence due to multiple ativan doses for agitation, delirium - Intubated form 1/23 through 1/28 - Eating regular diet this am, more alert and participates in conversation  - Has received total of 7 days of zosyn  - May use as needed [place] oxygen to keep O2 sats above 88%  Delirium secondary to depression / anxiety / alcohol abuse and possible baclofen withdrawal - Precedex drip weaned off 2/1 - Pt is on seroquel at 200 mg BID but concern that this med may have cause SVT. Seroquel dose cut to 100 mg BID on 2/1  - Also on Zoloft 50 mg daily  - Increased clonazepam to 1 mg TID - Will defer psych consult for now as he look better and more alert  Hypokalemia - Supplemented and WNL  SVT - Lkely related to agitation.   - Started on amiodarone drip on 2/1 but  he is now sinus.   - D/C'ed amio 2/1  Anemia of chronic disease / Acute upper GI bleed - Likely related to portal gastropathy.  - S/P EGD - S/P 5 units PRBC transfusion so far  - Hemoglobin stable at 9.2  AKI - Improved with fluids - Cr WNL  DVT prophylaxis: SCD's bilaterally  Code Status: full code  Family Communication: no family at the bedsdie Disposition Plan: to SNF likely Monday, lower temp this am, will monitor to make sure WNL   Consultants:   Nutrition   Procedures:   EGD 1/25>> Portal hypertensive gastropathy versus inflammatory gastritis.  EEG 1/25>> severe diffuse generalized slowing, no seizures  ETT 1/23>>1/28  RIJ CVC 1/25>>  Antimicrobials:   Zosyn 1/23 --> 1/30    Subjective: No overnight events.   Objective: Vitals:   10/13/16 0123 10/13/16 0341 10/13/16 0739 10/13/16 1210  BP: 119/62 126/72 (!) 154/86 119/61  Pulse: 98 100 94 91  Resp: 19 16 16 17   Temp: 98.6 F (37 C) 97.6 F (36.4 C) 98 F (36.7 C) (!) 96.6 F (35.9 C)  TempSrc: Axillary Axillary Oral Axillary  SpO2: 91% 95% 94% 95%  Weight:  98.9 kg (218 lb 0.6 oz)    Height:        Intake/Output Summary (Last 24 hours) at 10/13/16 1238 Last data filed at 10/13/16 0800  Gross per 24 hour  Intake  370 ml  Output             1085 ml  Net             -715 ml   Filed Weights   10/11/16 0415 10/12/16 0400 10/13/16 0341  Weight: 108 kg (238 lb 1.6 oz) 102.9 kg (226 lb 13.7 oz) 98.9 kg (218 lb 0.6 oz)    Examination:  General exam: Appears calm and comfortable, more alert  Respiratory system: No wheezing, no rhonchi  Cardiovascular system: S1 & S2 heard, Rate controlled  Gastrointestinal system: (+) BS, non tender  Central nervous system: more alert, no focal deficits  Extremities: No swelling, no tenderness  Skin: No rashes, lesions or ulcers Psychiatry: Normal mood and affect   Data Reviewed: I have personally reviewed following labs and imaging  studies  CBC:  Recent Labs Lab 10/09/16 0345 10/10/16 0509 10/11/16 0312 10/12/16 0339 10/13/16 0213  WBC 6.6 5.8 7.8 6.8 7.5  HGB 8.0* 9.0* 9.1* 9.7* 9.2*  HCT 26.8* 30.2* 30.7* 32.5* 30.2*  MCV 84.5 83.9 82.7 81.7 83.0  PLT 128* 136* 147* 160 [id number]   Basic Metabolic Panel:  Recent Labs Lab 10/09/16 0345 10/10/16 0509 10/10/16 1209 10/11/16 0312 10/12/16 0339 10/13/16 0213  NA 144 143  --  142 141 140  K 3.6 3.5  --  3.0* 3.2* 4.1  CL 108 112*  --  112* 108 108  CO2 29 27  --  24 22 24   GLUCOSE 135* 132*  --  129* 117* 110*  BUN 15 17  --  14 9 8   CREATININE 0.74 0.71  --  0.82 0.87 0.88  CALCIUM 8.5* 8.3*  --  8.3* 8.6* 8.7*  MG 2.1  --  2.1 1.9 2.0 2.2  PHOS 3.0  --  3.0 2.0* 3.6 3.7   GFR: Estimated Creatinine Clearance: 103.8 mL/min (by C-G formula based on SCr of 0.88 mg/dL). Liver Function Tests:  Recent Labs Lab 10/08/16 1231  AST 34  ALT 32  ALKPHOS 56  BILITOT 0.9  PROT 5.5*  ALBUMIN 2.5*   No results for input(s): LIPASE, AMYLASE in the last 168 hours.  Recent Labs Lab 10/08/16 1230  AMMONIA 18   Coagulation Profile: No results for input(s): INR, PROTIME in the last 168 hours. Cardiac Enzymes: No results for input(s): CKTOTAL, CKMB, CKMBINDEX, TROPONINI in the last 168 hours. BNP (last 3 results) No results for input(s): PROBNP in the last 8760 hours. HbA1C: No results for input(s): HGBA1C in the last 72 hours. CBG:  Recent Labs Lab 10/12/16 2032 10/12/16 2335 10/13/16 0351 10/13/16 0741 10/13/16 1214  GLUCAP 113* 103* 96 112* 99   Lipid Profile: No results for input(s): CHOL, HDL, LDLCALC, TRIG, CHOLHDL, LDLDIRECT in the last 72 hours. Thyroid Function Tests: No results for input(s): TSH, T4TOTAL, FREET4, T3FREE, THYROIDAB in the last 72 hours. Anemia Panel: No results for input(s): VITAMINB12, FOLATE, FERRITIN, TIBC, IRON, RETICCTPCT in the last 72 hours. Urine analysis:    Component Value Date/Time   COLORURINE YELLOW  10/02/2016 0100   APPEARANCEUR CLEAR 10/02/2016 0100   LABSPEC >1.046 (H) 10/02/2016 0100   PHURINE 5.0 10/02/2016 0100   GLUCOSEU NEGATIVE 10/02/2016 0100   HGBUR NEGATIVE 10/02/2016 0100   BILIRUBINUR NEGATIVE 10/02/2016 0100   KETONESUR NEGATIVE 10/02/2016 0100   PROTEINUR NEGATIVE 10/02/2016 0100   NITRITE NEGATIVE 10/02/2016 0100   LEUKOCYTESUR NEGATIVE 10/02/2016 0100   Sepsis Labs: @LABRCNTIP (procalcitonin:4,lacticidven:4)   Recent Results (from the past 240  hour(s))  MRSA PCR Screening     Status: None   Collection Time: 10/02/16  4:34 PM  Result Value Ref Range Status   MRSA by PCR NEGATIVE NEGATIVE Final      Radiology Studies: Dg Chest Port 1 View Result Date: 10/11/2016  Improved bibasilar atelectasis or pneumonia. Persistent pulmonary interstitial edema and pulmonary vascular congestion consistent with CHF.   Dg Abd Portable 1v Result Date: 10/08/2016 Feeding tube tip is in the distal descending duodenum.     Scheduled Meds: . baclofen  40 mg Oral QHS  . clonazePAM  1 mg Oral TID  . feeding supplement (ENSURE ENLIVE)  237 mL Oral BID BM  . folic acid  1 mg Oral Daily  . levETIRAcetam  500 mg Oral Daily  . mouth rinse  15 mL Mouth Rinse BID  . nicotine  14 mg Transdermal Daily  . pantoprazole  40 mg Oral QHS  . QUEtiapine  100 mg Oral BID  . sertraline  50 mg Oral Daily  . thiamine  100 mg Oral Daily   Continuous Infusions: . dexmedetomidine Stopped (10/11/16 0500)     LOS: 12 days    Time spent: 25 minutes  Greater than 50% of the time spent on counseling and coordinating the care.   Leisa Lenz, MD [facility] Pager [phone]  If 7PM-7AM, please contact night-coverage [email or web address] Password [facility] 10/13/2016, 12:38 PM

## 2016-10-13 NOTE — Plan of Care (Signed)
Problem: Education: Goal: Knowledge of [facility] Education information/materials will improve Outcome: Progressing Discussed with the patient with about plan of care about medications and collection bag with some teach back displayed.

## 2016-10-14 ENCOUNTER — Inpatient Hospital Stay (HOSPITAL_COMMUNITY): Payer: Medicare Other

## 2016-10-14 DIAGNOSIS — F17210 Nicotine dependence, cigarettes, uncomplicated: Secondary | ICD-10-CM

## 2016-10-14 DIAGNOSIS — Z91013 Allergy to seafood: Secondary | ICD-10-CM

## 2016-10-14 DIAGNOSIS — Z9889 Other specified postprocedural states: Secondary | ICD-10-CM

## 2016-10-14 DIAGNOSIS — R7989 Other specified abnormal findings of blood chemistry: Secondary | ICD-10-CM

## 2016-10-14 DIAGNOSIS — J9600 Acute respiratory failure, unspecified whether with hypoxia or hypercapnia: Secondary | ICD-10-CM

## 2016-10-14 DIAGNOSIS — G9340 Encephalopathy, unspecified: Secondary | ICD-10-CM

## 2016-10-14 DIAGNOSIS — Z79899 Other long term (current) drug therapy: Secondary | ICD-10-CM

## 2016-10-14 DIAGNOSIS — Z800 Family history of malignant neoplasm of digestive organs: Secondary | ICD-10-CM

## 2016-10-14 LAB — BASIC METABOLIC PANEL
ANION GAP: 10 (ref 5–15)
BUN: 7 mg/dL (ref 6–20)
CHLORIDE: 105 mmol/L (ref 101–111)
CO2: 24 mmol/L (ref 22–32)
CREATININE: 0.83 mg/dL (ref 0.61–1.24)
Calcium: 9.1 mg/dL (ref 8.9–10.3)
GFR calc non Af Amer: >60 mL/min (ref >60)
Glucose, Bld: 116 mg/dL — ABNORMAL HIGH (ref 65–99)
Potassium: 3.6 mmol/L (ref 3.5–5.1)
SODIUM: 139 mmol/L (ref 135–145)

## 2016-10-14 LAB — CBC
HCT: 34.1 % — ABNORMAL LOW (ref 39.0–52.0)
HEMOGLOBIN: 10.4 g/dL — AB (ref 13.0–17.0)
MCH: 24.9 pg — ABNORMAL LOW (ref 26.0–34.0)
MCHC: 30.5 g/dL (ref 30.0–36.0)
MCV: 81.8 fL (ref 78.0–100.0)
PLATELETS: 178 10*3/uL (ref 150–400)
RBC: 4.17 MIL/uL — AB (ref 4.22–5.81)
RDW: 19 % — ABNORMAL HIGH (ref 11.5–15.5)
WBC: 6.2 10*3/uL (ref 4.0–10.5)

## 2016-10-14 LAB — URINALYSIS, ROUTINE W REFLEX MICROSCOPIC
Bilirubin Urine: NEGATIVE
Glucose, UA: NEGATIVE mg/dL
HGB URINE DIPSTICK: NEGATIVE
Ketones, ur: NEGATIVE mg/dL
LEUKOCYTES UA: NEGATIVE
NITRITE: NEGATIVE
PROTEIN: NEGATIVE mg/dL
SPECIFIC GRAVITY, URINE: 1.012 (ref 1.005–1.030)
pH: 7 (ref 5.0–8.0)

## 2016-10-14 LAB — TSH: TSH: 3.081 u[IU]/mL (ref 0.350–4.500)

## 2016-10-14 LAB — GLUCOSE, CAPILLARY
GLUCOSE-CAPILLARY: 145 mg/dL — AB (ref 65–99)
GLUCOSE-CAPILLARY: 98 mg/dL (ref 65–99)
Glucose-Capillary: 135 mg/dL — ABNORMAL HIGH (ref 65–99)
Glucose-Capillary: 103 mg/dL — ABNORMAL HIGH (ref 65–99)

## 2016-10-14 LAB — TROPONIN I

## 2016-10-14 LAB — MAGNESIUM: MAGNESIUM: 2.1 mg/dL (ref 1.7–2.4)

## 2016-10-14 LAB — D-DIMER, QUANTITATIVE: D-Dimer, Quant: 3.61 ug/mL-FEU — ABNORMAL HIGH (ref 0.00–0.50)

## 2016-10-14 LAB — PHOSPHORUS: Phosphorus: 2.5 mg/dL (ref 2.5–4.6)

## 2016-10-14 LAB — AMMONIA: AMMONIA: 14 umol/L (ref 9–35)

## 2016-10-14 MED ORDER — IOPAMIDOL (ISOVUE-370) INJECTION 76%
INTRAVENOUS | Status: AC
  Administered 2016-10-14: 100 mL
  Filled 2016-10-14: qty 100

## 2016-10-14 MED ORDER — IOPAMIDOL (ISOVUE-300) INJECTION 61%
INTRAVENOUS | Status: AC
  Administered 2016-10-14: 75 mL via INTRAVENOUS
  Filled 2016-10-14: qty 75

## 2016-10-14 MED ORDER — SODIUM CHLORIDE 0.9 % IV SOLN
INTRAVENOUS | Status: AC
  Administered 2016-10-14 – 2016-10-16 (×4): via INTRAVENOUS
  Filled 2016-10-14 (×4): qty 1000

## 2016-10-14 MED ORDER — METOPROLOL TARTRATE 5 MG/5ML IV SOLN: 2.5000 mg | INTRAVENOUS | Status: DC | PRN

## 2016-10-14 NOTE — Plan of Care (Signed)
Problem: Education: Goal: Knowledge of [facility] Education information/materials will improve Outcome: Progressing Discussed need for further radiology test with the patient with no teach back displayed.

## 2016-10-14 NOTE — Progress Notes (Signed)
PROGRESS NOTE    Barry Mccoy  [id number] DOB: 06-Dec-1950 DOA: 10/01/2016 PCP: No PCP Per Patient   Brief Narrative:  66 year old man with past medical history of migraine headaches (takes keppra), depression who presented to [facility] 10/01/16 after nearly passing out while out shopping. Pt also reported exertional dyspnea, black stools. He was hypotensive in ED and it was thought that this is due to possible GI bleed. He was transfused total of 5 units PRBC during this admission. He developed acute delirium and agitation requiring total of 12 mg ativan which resulted in somnolence and acute respiratory failure with hypoxia and apnea. He was intubated from 1/23 - 1/28. He also required brief precedex infusion. He also had afib/SVT/sinus tachycardia >>placed on amio IV and then converted to sinus tachycardia.    Assessment & Plan:   Principal Problem:   Acute GI bleeding Active Problems:   Symptomatic anemia   Delirium   Acute respiratory failure (HCC)   Severe single current episode of major depressive disorder, with psychotic features ([place])   Acute encephalopathy   Positive D dimer  Acute respiratory failure with hypoxia and inability to protect airways / Aspiration pneumonitis - Somnolence due to multiple ativan doses for agitation, delirium - Intubated form 1/23 through 1/28 - Eating regular diet this am, more alert and participates in conversation although alert to self only with some confusion noted delusions. - Has received total of 7 days of zosyn and needs no further antibiotics at this time. Patient currently afebrile with a normal white count. - May use as needed [place] oxygen to keep O2 sats above 88%  Delirium secondary to depression / anxiety / alcohol abuse and possible baclofen withdrawal - Precedex drip weaned off 2/1 - Pt was on seroquel at 200 mg BID but concern that this med may have cause SVT. Seroquel dose cut to 100 mg BID on 2/1 per PCCM.  - Also on Zoloft 50 mg  daily  - Increased clonazepam to 1 mg TID - Patient noted overnight to have hallucinations, delusions, combative, verbally abusive and striking out at staff members.4 point restraints placed. Will consulted psychiatry for further evaluation and management.  Hypokalemia - Supplemented and WNL -K dual 40 by mouth 1.  SVT - Lkely related to agitation. Patient also noted to have a elevated d-dimer. Check a CT angiogram of the chest to rule out PE. - Started on amiodarone drip on 2/1 but he is now sinus.  - D/C'ed amio 2/1  Anemia of chronic disease / Acute upper GI bleed - Likely related to portal gastropathy.  - S/P EGD 10/04/2016--with portal gastropathy versus inflammatory gastritis. - S/P 5 units PRBC transfusion so far with hemoglobin currently at 10.4 which seems to have stabilized.  - Follow H&H.  AKI - Resolved with fluids - Cr WNL  Elevated d-dimer Patient noted to have elevated d-dimer of 3.61 on 10/14/2016. Patient had presented on admission with recurrent episodes of near syncope noted to be hypotensive and noted to have a symptomatic anemia with a hemoglobin of 4.1 on admission. During the hospitalization patient has had bouts of agitation, hallucinations, delusions. Patient also noted to go into SVT. Will check a CT angiogram of the chest to rule out PE.    DVT prophylaxis: SCDs Code Status: Full Family Communication: No family at bedside. Disposition Plan: Skilled nursing facility pending psychiatric evaluation and once agitation/delirium improves.   Consultants:   Gastroenterology: Dr. Benson Norway 10/02/2016  PCCM: Dr Nelda Marse[place] 10/02/2016  Psychiatry pending  Procedures:   EGD 1/25>> Portal hypertensive gastropathy versus inflammatory gastritis.  EEG 1/25>> severe diffuse generalized slowing, no seizures  ETT 1/23>>1/28  RIJ CVC 1/25>>  1 unit packed red blood cells 10/01/2016  4 units packed red blood cells 10/02/2016  SIGNIFICANT EVENTS: 1/22 admit,  transfused 3 u pRBC 1/23 intubated, transfused 2 u pRBC 1/25 underwent EEG and EGD 1/28 Extubated  Antimicrobials:   IV Zosyn 10/02/2016>>>> 10/09/2016   Subjective: Patient laying in bed trying to get out of the mittens. Patient oriented to self. Thinks he is in [place]. Knows its 2018, unsure of month. Denies chest pain. Denies shortness of breath. Asking to be able to get out of the mittens. Events overnight of agitation, hallucinations, delusions noted. Patient had to be placed in 4 point restraints.  Objective: Vitals:   10/14/16 0500 10/14/16 0600 10/14/16 0749 10/14/16 0900  BP: (!) 149/77 (!) 168/79 (!) 148/68 135/68  Pulse: (!) 103 (!) 106 (!) 111 (!) 106  Resp: (!) 22 18 20  (!) 24  Temp:   98.7 F (37.1 C)   TempSrc:   Oral   SpO2: 100% 100% 95% 98%  Weight:      Height:        Intake/Output Summary (Last 24 hours) at 10/14/16 1006 Last data filed at 10/14/16 0526  Gross per 24 hour  Intake              240 ml  Output             1600 ml  Net            -1360 ml   Filed Weights   10/12/16 0400 10/13/16 0341 10/14/16 0300  Weight: 102.9 kg (226 lb 13.7 oz) 98.9 kg (218 lb 0.6 oz) 97.9 kg (215 lb 13.3 oz)    Examination:  General exam: Laying in bed trying to get out of mittens. Respiratory system: Clear to auscultation anterior lung fields. Respiratory effort normal. Cardiovascular system: Taccycardia. No JVD, murmurs, rubs, gallops or clicks. No pedal edema. Gastrointestinal system: Abdomen is nondistended, soft and nontender. No organomegaly or masses felt. Normal bowel sounds heard. Central nervous system: Alert to self only. Moving extremities spontaneously. Extremities: Symmetric 5 x 5 power. Skin: No rashes, lesions or ulcers Psychiatry: Judgement and insight appear poor. Mood & affect fair.     Data Reviewed: I have personally reviewed following labs and imaging studies  CBC:  Recent Labs Lab 10/10/16 0509 10/11/16 0312 10/12/16 0339  10/13/16 0213 10/14/16 0105  WBC 5.8 7.8 6.8 7.5 6.2  HGB 9.0* 9.1* 9.7* 9.2* 10.4*  HCT 30.2* 30.7* 32.5* 30.2* 34.1*  MCV 83.9 82.7 81.7 83.0 81.8  PLT 136* 147* 160 172 0000000   Basic Metabolic Panel:  Recent Labs Lab 10/10/16 0509 10/10/16 1209 10/11/16 0312 10/12/16 0339 10/13/16 0213 10/14/16 0105  NA 143  --  142 141 140 139  K 3.5  --  3.0* 3.2* 4.1 3.6  CL 112*  --  112* 108 108 105  CO2 27  --  24 22 24 24   GLUCOSE 132*  --  129* 117* 110* 116*  BUN 17  --  14 9 8 7   CREATININE 0.71  --  0.82 0.87 0.88 0.83  CALCIUM 8.3*  --  8.3* 8.6* 8.7* 9.1  MG  --  2.1 1.9 2.0 2.2 2.1  PHOS  --  3.0 2.0* 3.6 3.7 2.5   GFR: Estimated Creatinine Clearance: 101.8 mL/min (by C-G formula based  on SCr of 0.83 mg/dL). Liver Function Tests:  Recent Labs Lab 10/08/16 1231  AST 34  ALT 32  ALKPHOS 56  BILITOT 0.9  PROT 5.5*  ALBUMIN 2.5*   No results for input(s): LIPASE, AMYLASE in the last 168 hours.  Recent Labs Lab 10/08/16 1230 10/14/16 0639  AMMONIA 18 14   Coagulation Profile: No results for input(s): INR, PROTIME in the last 168 hours. Cardiac Enzymes:  Recent Labs Lab 10/14/16 0108 10/14/16 0635  TROPONINI <0.03 <0.03   BNP (last 3 results) No results for input(s): PROBNP in the last 8760 hours. HbA1C: No results for input(s): HGBA1C in the last 72 hours. CBG:  Recent Labs Lab 10/13/16 1745 10/13/16 1944 10/13/16 2345 10/14/16 0338 10/14/16 0752  GLUCAP 126* 83 113* 135* 145*   Lipid Profile: No results for input(s): CHOL, HDL, LDLCALC, TRIG, CHOLHDL, LDLDIRECT in the last 72 hours. Thyroid Function Tests:  Recent Labs  10/14/16 0108  TSH 3.081   Anemia Panel: No results for input(s): VITAMINB12, FOLATE, FERRITIN, TIBC, IRON, RETICCTPCT in the last 72 hours. Sepsis Labs: No results for input(s): PROCALCITON, LATICACIDVEN in the last 168 hours.  No results found for this or any previous visit (from the past 240 hour(s)).        Radiology Studies: No results found.      Scheduled Meds: . baclofen  40 mg Oral QHS  . clonazePAM  1 mg Oral TID  . feeding supplement (ENSURE ENLIVE)  237 mL Oral BID BM  . folic acid  1 mg Oral Daily  . levETIRAcetam  500 mg Oral Daily  . mouth rinse  15 mL Mouth Rinse BID  . nicotine  14 mg Transdermal Daily  . pantoprazole  40 mg Oral QHS  . QUEtiapine  100 mg Oral BID  . sertraline  50 mg Oral Daily  . thiamine  100 mg Oral Daily   Continuous Infusions: . dexmedetomidine Stopped (10/11/16 0500)     LOS: 13 days    Time spent: 84 mins    THOMPSON,DANIEL, MD [facility] Pager [phone]  If 7PM-7AM, please contact night-coverage [email or web address] Password [email or web address] 10/14/2016, 10:06 AM

## 2016-10-14 NOTE — Progress Notes (Signed)
Pt's daughter called to inform social worker that they request their father to be sent to [facility] in [place], [place].

## 2016-10-14 NOTE — Consult Note (Addendum)
[place] [facility] Consult   Reason for Consult:  Delusion, verbally abusive, hallucinations Referring Physician:  Dr. Grandville Silos Patient Identification: Barry Mccoy MRN:  [id number] Principal Diagnosis: Acute GI bleeding Diagnosis:   Patient Active Problem List   Diagnosis Date Noted  . Acute encephalopathy [G93.40]   . Positive D dimer [R79.89]   . Severe single current episode of major depressive disorder, with psychotic features ([place]) [F32.3]   . Delirium [R41.0]   . Acute respiratory failure ([place]) [J96.00]   . Symptomatic anemia [D64.9] 10/01/2016  . Acute GI bleeding [K92.2] 10/01/2016    Total Time spent with patient: 45 minutes  Subjective:   Barry Mccoy is a 66 y.o. male patient admitted with acute GI bleeding  HPI:  Patient is confused, disoriented, combative and unable to give a coherent history. Chart review indicates that patient has history of major depression with psychosis. He was admitted to the medical unit on 10/01/16 after nearly passing out while out shopping. He was hypotensive and had to be transfused 5 units of PRBC. He later developed agitation and delirium. Further history could not be obtained from the patient due to being incoherent and confused.  Past Psychiatric History: MDD with psychosis  Risk to Self: Is patient at risk for suicide?: No Risk to Others:   Prior Inpatient Therapy:   Prior Outpatient Therapy:    Past Medical History:  Past Medical History:  Diagnosis Date  . Muscle spasm     Past Surgical History:  Procedure Laterality Date  . ELBOW SURGERY    . ESOPHAGOGASTRODUODENOSCOPY N/A 10/04/2016   Procedure: ESOPHAGOGASTRODUODENOSCOPY (EGD);  Surgeon: Carol Ada, MD;  Location: [facility];  Service: Gastroenterology;  Laterality: N/A;   Family History:  Family History  Problem Relation Age of Onset  . Colon cancer Brother    Family Psychiatric  History:  Social History:  History  Alcohol Use No     History  Drug Use  No    Social History   Social History  . Marital status: Widowed    Spouse name: N/A  . Number of children: N/A  . Years of education: N/A   Social History Main Topics  . Smoking status: Current Every Day Smoker    Packs/day: 1.00    Types: Cigarettes  . Smokeless tobacco: Never Used  . Alcohol use No  . Drug use: No  . Sexual activity: Not Asked   Other Topics Concern  . None   Social History Narrative  . None   Additional Social History:    Allergies:   Allergies  Allergen Reactions  . Shellfish Allergy Other (See Comments)    Unknown     Labs:  Results for orders placed or performed during the hospital encounter of 10/01/16 (from the past 48 hour(s))  Glucose, capillary     Status: Abnormal   Collection Time: 10/12/16  4:12 PM  Result Value Ref Range   Glucose-Capillary 118 (H) 65 - 99 mg/dL   Comment 1 Notify RN    Comment 2 Document in Chart   Glucose, capillary     Status: Abnormal   Collection Time: 10/12/16  8:32 PM  Result Value Ref Range   Glucose-Capillary 113 (H) 65 - 99 mg/dL   Comment 1 Notify RN   Glucose, capillary     Status: Abnormal   Collection Time: 10/12/16 11:35 PM  Result Value Ref Range   Glucose-Capillary 103 (H) 65 - 99 mg/dL   Comment 1 Notify RN  Basic metabolic panel     Status: Abnormal   Collection Time: 10/13/16  2:13 AM  Result Value Ref Range   Sodium 140 135 - 145 mmol/L   Potassium 4.1 3.5 - 5.1 mmol/L    Comment: DELTA CHECK NOTED   Chloride 108 101 - 111 mmol/L   CO2 24 22 - 32 mmol/L   Glucose, Bld 110 (H) 65 - 99 mg/dL   BUN 8 6 - 20 mg/dL   Creatinine, Ser 0.88 0.61 - 1.24 mg/dL   Calcium 8.7 (L) 8.9 - 10.3 mg/dL   GFR calc non Af Amer >60 >60 mL/min   GFR calc Af Amer >60 >60 mL/min    Comment: (NOTE) The eGFR has been calculated using the CKD EPI equation. This calculation has not been validated in all clinical situations. eGFR's persistently <60 mL/min signify possible Chronic Kidney Disease.     Anion gap 8 5 - 15  CBC     Status: Abnormal   Collection Time: 10/13/16  2:13 AM  Result Value Ref Range   WBC 7.5 4.0 - 10.5 K/uL   RBC 3.64 (L) 4.22 - 5.81 MIL/uL   Hemoglobin 9.2 (L) 13.0 - 17.0 g/dL   HCT 30.2 (L) 39.0 - 52.0 %   MCV 83.0 78.0 - 100.0 fL   MCH 25.3 (L) 26.0 - 34.0 pg   MCHC 30.5 30.0 - 36.0 g/dL   RDW 19.5 (H) 11.5 - 15.5 %   Platelets 172 150 - 400 K/uL  Magnesium     Status: None   Collection Time: 10/13/16  2:13 AM  Result Value Ref Range   Magnesium 2.2 1.7 - 2.4 mg/dL  Phosphorus     Status: None   Collection Time: 10/13/16  2:13 AM  Result Value Ref Range   Phosphorus 3.7 2.5 - 4.6 mg/dL  Glucose, capillary     Status: None   Collection Time: 10/13/16  3:51 AM  Result Value Ref Range   Glucose-Capillary 96 65 - 99 mg/dL   Comment 1 Notify RN   Glucose, capillary     Status: Abnormal   Collection Time: 10/13/16  7:41 AM  Result Value Ref Range   Glucose-Capillary 112 (H) 65 - 99 mg/dL  Glucose, capillary     Status: None   Collection Time: 10/13/16 12:14 PM  Result Value Ref Range   Glucose-Capillary 99 65 - 99 mg/dL  Glucose, capillary     Status: Abnormal   Collection Time: 10/13/16  5:45 PM  Result Value Ref Range   Glucose-Capillary 126 (H) 65 - 99 mg/dL  Glucose, capillary     Status: None   Collection Time: 10/13/16  7:44 PM  Result Value Ref Range   Glucose-Capillary 83 65 - 99 mg/dL   Comment 1 Notify RN   Glucose, capillary     Status: Abnormal   Collection Time: 10/13/16 11:45 PM  Result Value Ref Range   Glucose-Capillary 113 (H) 65 - 99 mg/dL   Comment 1 Notify RN   Basic metabolic panel     Status: Abnormal   Collection Time: 10/14/16  1:05 AM  Result Value Ref Range   Sodium 139 135 - 145 mmol/L   Potassium 3.6 3.5 - 5.1 mmol/L   Chloride 105 101 - 111 mmol/L   CO2 24 22 - 32 mmol/L   Glucose, Bld 116 (H) 65 - 99 mg/dL   BUN 7 6 - 20 mg/dL   Creatinine, Ser 0.83 0.61 - 1.24 mg/dL  Calcium 9.1 8.9 - 10.3 mg/dL   GFR  calc non Af Amer >60 >60 mL/min   GFR calc Af Amer >60 >60 mL/min    Comment: (NOTE) The eGFR has been calculated using the CKD EPI equation. This calculation has not been validated in all clinical situations. eGFR's persistently <60 mL/min signify possible Chronic Kidney Disease.    Anion gap 10 5 - 15  CBC     Status: Abnormal   Collection Time: 10/14/16  1:05 AM  Result Value Ref Range   WBC 6.2 4.0 - 10.5 K/uL   RBC 4.17 (L) 4.22 - 5.81 MIL/uL   Hemoglobin 10.4 (L) 13.0 - 17.0 g/dL   HCT 34.1 (L) 39.0 - 52.0 %   MCV 81.8 78.0 - 100.0 fL   MCH 24.9 (L) 26.0 - 34.0 pg   MCHC 30.5 30.0 - 36.0 g/dL   RDW 19.0 (H) 11.5 - 15.5 %   Platelets 178 150 - 400 K/uL  Magnesium     Status: None   Collection Time: 10/14/16  1:05 AM  Result Value Ref Range   Magnesium 2.1 1.7 - 2.4 mg/dL  Phosphorus     Status: None   Collection Time: 10/14/16  1:05 AM  Result Value Ref Range   Phosphorus 2.5 2.5 - 4.6 mg/dL  Troponin I (q 6hr x 3)     Status: None   Collection Time: 10/14/16  1:08 AM  Result Value Ref Range   Troponin I <0.03 <0.03 ng/mL  TSH     Status: None   Collection Time: 10/14/16  1:08 AM  Result Value Ref Range   TSH 3.081 0.350 - 4.500 uIU/mL    Comment: Performed by a 3rd Generation assay with a functional sensitivity of <=0.01 uIU/mL.  D-dimer, quantitative (not at [facility])     Status: Abnormal   Collection Time: 10/14/16  1:08 AM  Result Value Ref Range   D-Dimer, Quant 3.61 (H) 0.00 - 0.50 ug/mL-FEU    Comment: (NOTE) At the manufacturer cut-off of 0.50 ug/mL FEU, this assay has been documented to exclude PE with a sensitivity and negative predictive value of 97 to 99%.  At this time, this assay has not been approved by the FDA to exclude DVT/VTE. Results should be correlated with clinical presentation.   Glucose, capillary     Status: Abnormal   Collection Time: 10/14/16  3:38 AM  Result Value Ref Range   Glucose-Capillary 135 (H) 65 - 99 mg/dL   Comment 1 Notify  RN   Urinalysis, Routine w reflex microscopic     Status: None   Collection Time: 10/14/16  5:05 AM  Result Value Ref Range   Color, Urine YELLOW YELLOW   APPearance CLEAR CLEAR   Specific Gravity, Urine 1.012 1.005 - 1.030   pH 7.0 5.0 - 8.0   Glucose, UA NEGATIVE NEGATIVE mg/dL   Hgb urine dipstick NEGATIVE NEGATIVE   Bilirubin Urine NEGATIVE NEGATIVE   Ketones, ur NEGATIVE NEGATIVE mg/dL   Protein, ur NEGATIVE NEGATIVE mg/dL   Nitrite NEGATIVE NEGATIVE   Leukocytes, UA NEGATIVE NEGATIVE  Troponin I (q 6hr x 3)     Status: None   Collection Time: 10/14/16  6:35 AM  Result Value Ref Range   Troponin I <0.03 <0.03 ng/mL  Ammonia     Status: None   Collection Time: 10/14/16  6:39 AM  Result Value Ref Range   Ammonia 14 9 - 35 umol/L  Glucose, capillary  Status: Abnormal   Collection Time: 10/14/16  7:52 AM  Result Value Ref Range   Glucose-Capillary 145 (H) 65 - 99 mg/dL  Troponin I (q 6hr x 3)     Status: None   Collection Time: 10/14/16 12:20 PM  Result Value Ref Range   Troponin I <0.03 <0.03 ng/mL    Current Facility-Administered Medications  Medication Dose Route Frequency Provider Last Rate Last Dose  . baclofen (LIORESAL) tablet 40 mg  40 mg Oral QHS Jose Shirl Harris, MD   40 mg at 10/13/16 2107  . clonazePAM (KLONOPIN) tablet 1 mg  1 mg Oral TID Robbie Lis, MD   1 mg at 10/14/16 0919  . dexmedetomidine (PRECEDEX) 200 MCG/50ML (4 mcg/mL) infusion  0.2-1.5 mcg/kg/hr Intravenous Titrated Anders Simmonds, MD   Stopped at 10/11/16 0500  . docusate (COLACE) 50 MG/5ML liquid 100 mg  100 mg Oral BID PRN Milagros Loll, MD      . feeding supplement (ENSURE ENLIVE) (ENSURE ENLIVE) liquid 237 mL  237 mL Oral BID BM Jose Shirl Harris, MD   237 mL at 10/14/16 [phone]  . fentaNYL (SUBLIMAZE) injection 12.5-25 mcg  12.5-25 mcg Intravenous Q2H PRN Milagros Loll, MD   25 mcg at 10/11/16 [phone]  . folic acid (FOLVITE) tablet 1 mg  1 mg Oral Daily Praveen Mannam, MD   1 mg  at 10/14/16 0919  . haloperidol lactate (HALDOL) injection 5 mg  5 mg Intravenous Q6H PRN Milagros Loll, MD   5 mg at 10/14/16 0314  . levETIRAcetam (KEPPRA XR) 24 hr tablet 500 mg  500 mg Oral Daily Flora Lipps, MD   500 mg at 10/14/16 0919  . LORazepam (ATIVAN) injection 1 mg  1 mg Intravenous Q3H PRN Jose Shirl Harris, MD   1 mg at 10/14/16 0255  . LORazepam (ATIVAN) injection 1 mg  1 mg Intramuscular Q3H PRN Jani Gravel, MD   1 mg at 10/14/16 5102  . MEDLINE mouth rinse  15 mL Mouth Rinse BID Praveen Mannam, MD   15 mL at 10/13/16 2107  . metoprolol (LOPRESSOR) injection 2.5-5 mg  2.5-5 mg Intravenous Q3H PRN Eugenie Filler, MD      . nicotine (NICODERM CQ - dosed in mg/24 hours) patch 14 mg  14 mg Transdermal Daily Praveen Mannam, MD   14 mg at 10/14/16 0919  . pantoprazole (PROTONIX) EC tablet 40 mg  40 mg Oral QHS Jose Shirl Harris, MD   40 mg at 10/13/16 2107  . QUEtiapine (SEROQUEL) tablet 100 mg  100 mg Oral BID Jose Shirl Harris, MD   100 mg at 10/14/16 0919  . sennosides (SENOKOT) 8.8 MG/5ML syrup 5 mL  5 mL Oral QHS PRN Milagros Loll, MD      . sertraline (ZOLOFT) tablet 50 mg  50 mg Oral Daily [place], MD   50 mg at 10/14/16 1300  . thiamine (VITAMIN B-1) tablet 100 mg  100 mg Oral Daily [place], MD   100 mg at 10/14/16 0919    Musculoskeletal: Strength & Muscle Tone: not tested Gait & Station: unable to stand Patient leans: N/A  Psychiatric Specialty Exam: Physical Exam  Psychiatric: Thought content normal. His mood appears anxious. His speech is tangential and slurred. He is combative. He expresses impulsivity.    Review of Systems  Unable to perform ROS: Mental status change  Blood pressure 133/62, pulse (!) 105, temperature 97.9 F (36.6 C), temperature source Oral, resp. rate 20, height 6' 2"  (1.88 m), weight 97.9 kg (215 lb 13.3 oz), SpO2 100 %.Body mass index is 27.71 kg/m.  General Appearance: Casual  Eye Contact:   Minimal  Speech:  Pressured and Slurred  Volume:  Increased  Mood:  Anxious  Affect:  Labile  Thought Process:  Disorganized  Orientation:  Other:  unable to assess  Thought Content:  Illogical  Suicidal Thoughts:  unable to assess  Homicidal Thoughts:  No  Memory:  unable to assess  Judgement:  Other:  unable to assess  Insight:  unable to assess  Psychomotor Activity:  Increased and Restlessness  Concentration:  Concentration: Poor and Attention Span: Poor  Recall:  Poor  Fund of Knowledge:  Fair  Language:  Fair  Akathisia:  No  Handed:  Right  AIMS (if indicated):     Assets:  Social Support  ADL's:  Impaired  Cognition: unable to assess   Sleep:        Treatment Plan Summary: Daily contact with patient to assess and evaluate symptoms and progress in treatment  Diagnosis: 1. Acute Delirium 2. History of MDD Recommendations; Neurology referral for delirium and acute confusional state Agreed with decreasing Seroquel to 100 mg bid. Wean patient off Clonazepam due to confusion/disorientation, Lorazepam is a better alternative for agitation in elderly Continue Zoloft 50 mg daily for depression. Agreed with PRN haldol for agitation.   Disposition: Patient does not meet criteria for psychiatric inpatient admission. Supportive therapy provided about ongoing stressors.  Re-consult psych unit when necessary.  Corena Pilgrim, MD 10/14/2016 4:08 PM

## 2016-10-14 NOTE — Progress Notes (Signed)
Follow-up: Notified by Dr Pascal Lux w/ radiology service regarding Ct abd/pelvis results which are positive for pneumoperitoneum. Felt most likely a diverticular source. Pt remains confused and agitated at times. Pt is afebrile and VSS. Discussed pt w/ Dr Thurnell Garbe w/ general surgery service who has agreed to evaluate pt. Appreciate surgery input. Will continue to monitor closely.   Jeryl Columbia, NP-C [facility] Pager [phone]

## 2016-10-14 NOTE — Plan of Care (Signed)
Problem: Health Behavior/Discharge Planning: Goal: Ability to manage health-related needs will improve Outcome: Not Progressing Had on-call MD come to the floor to observe change in behavior of patient (hallucinations, delusions, combative, verbally abusive and striking out at staff members).  Four point restraints needed after music, safety mitts, reorienting, toileting and medications for anxiety/agitation had failed to work.  See EPIC for orders.  Patient unable to go to CT due to lack of ability to stay still at this time.

## 2016-10-14 NOTE — Consult Note (Signed)
Reason for Consult:free air Referring Physician: Sabino Donovan, NP  Barry Mccoy is an 66 y.o. male.  HPI: I have been asked to evaluate this patient after a CAT scan of the abdomen and pelvis showed free air.  The patient was admitted on January 22 after a syncopal episode.  He was found to be anemic from GI bleeding requiring transfusions.  He had an EGD performed without clear ulcer.  He had increasing agitation during his hospitalization and eventually required intubation. He has since been extubated but has remained agitated.  He has never complained of abdominal pain and according to his exams daily his abdomen has been benign.  He underwent a CT scan of his chest today to rule out a PE and free air in the abdomen was seen. He then had a CT scan of the abdomen and pelvis. There was free air but the source was not quite evident. There is no clear inflammatory changes although a diverticular perforation cannot be completely ruled out. The patient is currently in in restraints and is confused. He denies abdominal pain.  Past Medical History:  Diagnosis Date  . Muscle spasm     Past Surgical History:  Procedure Laterality Date  . ELBOW SURGERY    . ESOPHAGOGASTRODUODENOSCOPY N/A 10/04/2016   Procedure: ESOPHAGOGASTRODUODENOSCOPY (EGD);  Surgeon: Carol Ada, MD;  Location: [facility];  Service: Gastroenterology;  Laterality: N/A;    Family History  Problem Relation Age of Onset  . Colon cancer Brother     Social History:  reports that he has been smoking Cigarettes.  He has been smoking about 1.00 pack per day. He has never used smokeless tobacco. He reports that he does not drink alcohol or use drugs.  Allergies:  Allergies  Allergen Reactions  . Shellfish Allergy Other (See Comments)    Unknown     Medications: I have reviewed the patient's current medications.  Results for orders placed or performed during the hospital encounter of 10/01/16 (from the past 48 hour(s))   Glucose, capillary     Status: Abnormal   Collection Time: 10/12/16 11:35 PM  Result Value Ref Range   Glucose-Capillary 103 (H) 65 - 99 mg/dL   Comment 1 Notify RN   Basic metabolic panel     Status: Abnormal   Collection Time: 10/13/16  2:13 AM  Result Value Ref Range   Sodium 140 135 - 145 mmol/L   Potassium 4.1 3.5 - 5.1 mmol/L    Comment: DELTA CHECK NOTED   Chloride 108 101 - 111 mmol/L   CO2 24 22 - 32 mmol/L   Glucose, Bld 110 (H) 65 - 99 mg/dL   BUN 8 6 - 20 mg/dL   Creatinine, Ser 0.88 0.61 - 1.24 mg/dL   Calcium 8.7 (L) 8.9 - 10.3 mg/dL   GFR calc non Af Amer >60 >60 mL/min   GFR calc Af Amer >60 >60 mL/min    Comment: (NOTE) The eGFR has been calculated using the CKD EPI equation. This calculation has not been validated in all clinical situations. eGFR's persistently <60 mL/min signify possible Chronic Kidney Disease.    Anion gap 8 5 - 15  CBC     Status: Abnormal   Collection Time: 10/13/16  2:13 AM  Result Value Ref Range   WBC 7.5 4.0 - 10.5 K/uL   RBC 3.64 (L) 4.22 - 5.81 MIL/uL   Hemoglobin 9.2 (L) 13.0 - 17.0 g/dL   HCT 30.2 (L) 39.0 - 52.0 %  MCV 83.0 78.0 - 100.0 fL   MCH 25.3 (L) 26.0 - 34.0 pg   MCHC 30.5 30.0 - 36.0 g/dL   RDW 19.5 (H) 11.5 - 15.5 %   Platelets 172 150 - 400 K/uL  Magnesium     Status: None   Collection Time: 10/13/16  2:13 AM  Result Value Ref Range   Magnesium 2.2 1.7 - 2.4 mg/dL  Phosphorus     Status: None   Collection Time: 10/13/16  2:13 AM  Result Value Ref Range   Phosphorus 3.7 2.5 - 4.6 mg/dL  Glucose, capillary     Status: None   Collection Time: 10/13/16  3:51 AM  Result Value Ref Range   Glucose-Capillary 96 65 - 99 mg/dL   Comment 1 Notify RN   Glucose, capillary     Status: Abnormal   Collection Time: 10/13/16  7:41 AM  Result Value Ref Range   Glucose-Capillary 112 (H) 65 - 99 mg/dL  Glucose, capillary     Status: None   Collection Time: 10/13/16 12:14 PM  Result Value Ref Range   Glucose-Capillary  99 65 - 99 mg/dL  Glucose, capillary     Status: Abnormal   Collection Time: 10/13/16  5:45 PM  Result Value Ref Range   Glucose-Capillary 126 (H) 65 - 99 mg/dL  Glucose, capillary     Status: None   Collection Time: 10/13/16  7:44 PM  Result Value Ref Range   Glucose-Capillary 83 65 - 99 mg/dL   Comment 1 Notify RN   Glucose, capillary     Status: Abnormal   Collection Time: 10/13/16 11:45 PM  Result Value Ref Range   Glucose-Capillary 113 (H) 65 - 99 mg/dL   Comment 1 Notify RN   Basic metabolic panel     Status: Abnormal   Collection Time: 10/14/16  1:05 AM  Result Value Ref Range   Sodium 139 135 - 145 mmol/L   Potassium 3.6 3.5 - 5.1 mmol/L   Chloride 105 101 - 111 mmol/L   CO2 24 22 - 32 mmol/L   Glucose, Bld 116 (H) 65 - 99 mg/dL   BUN 7 6 - 20 mg/dL   Creatinine, Ser 0.83 0.61 - 1.24 mg/dL   Calcium 9.1 8.9 - 10.3 mg/dL   GFR calc non Af Amer >60 >60 mL/min   GFR calc Af Amer >60 >60 mL/min    Comment: (NOTE) The eGFR has been calculated using the CKD EPI equation. This calculation has not been validated in all clinical situations. eGFR's persistently <60 mL/min signify possible Chronic Kidney Disease.    Anion gap 10 5 - 15  CBC     Status: Abnormal   Collection Time: 10/14/16  1:05 AM  Result Value Ref Range   WBC 6.2 4.0 - 10.5 K/uL   RBC 4.17 (L) 4.22 - 5.81 MIL/uL   Hemoglobin 10.4 (L) 13.0 - 17.0 g/dL   HCT 34.1 (L) 39.0 - 52.0 %   MCV 81.8 78.0 - 100.0 fL   MCH 24.9 (L) 26.0 - 34.0 pg   MCHC 30.5 30.0 - 36.0 g/dL   RDW 19.0 (H) 11.5 - 15.5 %   Platelets 178 150 - 400 K/uL  Magnesium     Status: None   Collection Time: 10/14/16  1:05 AM  Result Value Ref Range   Magnesium 2.1 1.7 - 2.4 mg/dL  Phosphorus     Status: None   Collection Time: 10/14/16  1:05 AM  Result Value Ref  Range   Phosphorus 2.5 2.5 - 4.6 mg/dL  Troponin I (q 6hr x 3)     Status: None   Collection Time: 10/14/16  1:08 AM  Result Value Ref Range   Troponin I <0.03 <0.03 ng/mL   TSH     Status: None   Collection Time: 10/14/16  1:08 AM  Result Value Ref Range   TSH 3.081 0.350 - 4.500 uIU/mL    Comment: Performed by a 3rd Generation assay with a functional sensitivity of <=0.01 uIU/mL.  D-dimer, quantitative (not at [facility])     Status: Abnormal   Collection Time: 10/14/16  1:08 AM  Result Value Ref Range   D-Dimer, Quant 3.61 (H) 0.00 - 0.50 ug/mL-FEU    Comment: (NOTE) At the manufacturer cut-off of 0.50 ug/mL FEU, this assay has been documented to exclude PE with a sensitivity and negative predictive value of 97 to 99%.  At this time, this assay has not been approved by the FDA to exclude DVT/VTE. Results should be correlated with clinical presentation.   Glucose, capillary     Status: Abnormal   Collection Time: 10/14/16  3:38 AM  Result Value Ref Range   Glucose-Capillary 135 (H) 65 - 99 mg/dL   Comment 1 Notify RN   Urinalysis, Routine w reflex microscopic     Status: None   Collection Time: 10/14/16  5:05 AM  Result Value Ref Range   Color, Urine YELLOW YELLOW   APPearance CLEAR CLEAR   Specific Gravity, Urine 1.012 1.005 - 1.030   pH 7.0 5.0 - 8.0   Glucose, UA NEGATIVE NEGATIVE mg/dL   Hgb urine dipstick NEGATIVE NEGATIVE   Bilirubin Urine NEGATIVE NEGATIVE   Ketones, ur NEGATIVE NEGATIVE mg/dL   Protein, ur NEGATIVE NEGATIVE mg/dL   Nitrite NEGATIVE NEGATIVE   Leukocytes, UA NEGATIVE NEGATIVE  Troponin I (q 6hr x 3)     Status: None   Collection Time: 10/14/16  6:35 AM  Result Value Ref Range   Troponin I <0.03 <0.03 ng/mL  Ammonia     Status: None   Collection Time: 10/14/16  6:39 AM  Result Value Ref Range   Ammonia 14 9 - 35 umol/L  Glucose, capillary     Status: Abnormal   Collection Time: 10/14/16  7:52 AM  Result Value Ref Range   Glucose-Capillary 145 (H) 65 - 99 mg/dL  Troponin I (q 6hr x 3)     Status: None   Collection Time: 10/14/16 12:20 PM  Result Value Ref Range   Troponin I <0.03 <0.03 ng/mL  Glucose, capillary      Status: Abnormal   Collection Time: 10/14/16  4:35 PM  Result Value Ref Range   Glucose-Capillary 103 (H) 65 - 99 mg/dL  Glucose, capillary     Status: None   Collection Time: 10/14/16  8:24 PM  Result Value Ref Range   Glucose-Capillary 98 65 - 99 mg/dL    Ct Head Wo Contrast  Result Date: 10/14/2016 CLINICAL DATA:  Altered mental status. EXAM: CT HEAD WITHOUT CONTRAST TECHNIQUE: Contiguous axial images were obtained from the base of the skull through the vertex without intravenous contrast. COMPARISON:  10/03/2016 FINDINGS: Brain: No evidence of acute infarction, hemorrhage, hydrocephalus, or mass lesion/mass effect. Small hygroma posterior to the left cerebellum is stable at 4 mm maximal thickness. No significant mass effect. Stable pattern of mild chronic microvascular disease. Dilated perivascular space versus remote lacune along the lower right putamen. Vascular: Atherosclerotic calcification.  No hyperdense  vessel. Skull: No acute finding Sinuses/Orbits: Negative IMPRESSION: No acute finding or change from prior. Electronically Signed   By: Monte Fantasia M.D.   On: 10/14/2016 16:04   Ct Angio Chest Pe W Or Wo Contrast  Result Date: 10/14/2016 CLINICAL DATA:  Hypoxia, agitation, SVT, elevated D-dimer. EXAM: CT ANGIOGRAPHY CHEST WITH CONTRAST TECHNIQUE: Multidetector CT imaging of the chest was performed using the standard protocol during bolus administration of intravenous contrast. Multiplanar CT image reconstructions and MIPs were obtained to evaluate the vascular anatomy. CONTRAST:  100 cc Isovue 370 COMPARISON:  None. FINDINGS: Cardiovascular: Beam hardening artifact, presumably from patient's arms, considerably limits characterization of the peripheral segmental and subsegmental pulmonary artery branches to the lower lobes bilaterally. I cannot exclude small peripheral pulmonary embolism. There is no pulmonary embolism identified within the main, lobar or central segmental pulmonary  arteries bilaterally. Heart size is normal. No pericardial effusion. No aortic aneurysm or dissection. Mild atherosclerotic changes noted along the walls of the descending thoracic aorta. Coronary artery calcifications noted. Mediastinum/Nodes: No mass or enlarged lymph nodes within the mediastinum or perihilar regions. Esophagus appears normal. Trachea and central bronchi are unremarkable. Lungs/Pleura: Small right pleural effusion with adjacent compressive atelectasis. Mild atelectasis at the left lung base. Hazy ground-glass opacities within the upper lobes bilaterally, likely additional atelectasis or perhaps mild edema. No pneumothorax. 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 Upper Abdomen: Free intraperitoneal air. Small amount of free fluid in the left upper quadrant. Mass versus fluid collection adjacent to the pancreatic tail, incompletely imaged. Multiple gallstones within the gallbladder, incompletely imaged. Musculoskeletal: No acute or suspicious osseous lesion. Mild degenerative spurring within the thoracic spine. Superficial soft tissues are unremarkable. Review of the MIP images confirms the above findings. IMPRESSION: 1. Free intraperitoneal air within the upper abdomen. In the absence of a recent surgery, this suggests viscus perforation. CT abdomen and pelvis is recommended for further characterization. 2. No pulmonary embolism seen, with study limitations detailed above. 3. Small right pleural effusion. Mild bibasilar atelectasis. Mild atelectasis versus edema within the upper lobes bilaterally. No evidence of pneumonia. 4. Free fluid in the left upper abdomen. Mass versus fluid collection adjacent to the pancreatic tail, incompletely imaged. Cholelithiasis, incompletely imaged. 5. Aortic atherosclerosis. Critical Value/emergent results were called by telephone at the time of interpretation on 10/14/2016 at 4:25 pm to Dr. Irine Seal , who verbally  acknowledged these results. Electronically Signed   By: Franki Cabot M.D.   On: 10/14/2016 16:29   Ct Abdomen Pelvis W Contrast  Addendum Date: 10/14/2016   ADDENDUM REPORT: 10/14/2016 21:30 ADDENDUM: Findings discussed with K. Schorr 10/14/2016  at 9:30 pm. Electronically Signed   By: Monte Fantasia M.D.   On: 10/14/2016 21:30   Result Date: 10/14/2016 CLINICAL DATA:  Follow-up chest CT.  Free air. EXAM: CT ABDOMEN AND PELVIS WITH CONTRAST TECHNIQUE: Multidetector CT imaging of the abdomen and pelvis was performed using the standard protocol following bolus administration of intravenous contrast. CONTRAST:  31m ISOVUE-300 IOPAMIDOL (ISOVUE-300) INJECTION 61% COMPARISON:  None. FINDINGS: Lower chest: Described separately. Small pleural effusions and atelectasis Hepatobiliary: Low-density along the falciform ligament is likely perfusion anomaly. Cannot exclude cirrhosis. The liver surface is lobulated. Cholelithiasis. No signs of acute cholecystitis. Pancreas: Cystic density of the pancreatic tail measuring 26 mm. No surrounding inflammatory changes. There is a coarse calcification and neighboring pancreatic tail which is somewhat full. Some this fullness may be related to venous collaterals. Spleen: Enlarged this 17 cm span.  This is likely related to chronic splenic vein occlusion. There are well-formed venous collaterals, including proximal gastric varices. Adrenals/Urinary Tract: Negative adrenals. No hydronephrosis or stone. Urinary bladder predominately decompressed by Foley catheter. Stomach/Bowel: Numerous colonic diverticula. There is question of diverticulitis at the splenic flexure, but no noted gas in this region to explain pneumoperitoneum. No inflammatory changes noted around the stomach or proximal duodenum. No visible ulcer. Proximal gastric varices. Negative appendix. Vascular/Lymphatic: No acute vascular abnormality. Splenic vein occlusion as described. Extensive atherosclerosis. No mass or  adenopathy. Reproductive:No pathologic findings. Other: Moderate volume pneumoperitoneum, maximal in the upper ventral abdomen, but also present in the left lower quadrant. Musculoskeletal:  Degenerative changes.  No acute abnormalities. Findings are known based on previous recorded communications. Mid level has been paged to make aware of the completed scan. IMPRESSION: 1. No definitive source for the patient's moderate pneumoperitoneum. Given there is no inflammatory changes or visible ulcer along the stomach or proximal duodenum, a diverticular source is favored. There may be mild diverticulitis along the splenic flexure, but no extraluminal gas in this region. 2. 26 mm fluid collection in the splenic fossa. Suspect pseudocyst as there is ductal or parenchymal calcification in the pancreatic tail. Pancreatic tail is full and pancreas protocol CT or MRI follow-up is recommended after convalescence to exclude mass. 3. 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 Patient was admitted with GI bleeding. 4. Cholelithiasis. 5. Suspected cirrhosis. 6. Splenomegaly. Electronically Signed: By: Monte Fantasia M.D. On: 10/14/2016 21:19    Review of Systems  Unable to perform ROS: Mental status change   Blood pressure (!) 149/74, pulse (!) 101, temperature 97.6 F (36.4 C), temperature source Oral, resp. rate 20, height 6' 2"  (1.88 m), weight 97.9 kg (215 lb 13.3 oz), SpO2 100 %. Physical Exam  Constitutional: He appears well-developed and well-nourished. He appears distressed.  HENT:  Head: Normocephalic and atraumatic.  Eyes: Conjunctivae are normal. Pupils are equal, round, and reactive to light.  Cardiovascular:  Tachycardic with regular rhythm  Respiratory: Effort normal. No respiratory distress.  GI: Soft. He exhibits no distension. There is no tenderness. There is no guarding.  His abdomen is completely soft and nontender. There is no distention.  Musculoskeletal: Normal  range of motion.  Neurological:  Confused  Skin: He is not diaphoretic.    Assessment/Plan: Patient with free air uncertain etiology.  It is uncertain of the cause of the free air or how long he has had it. He may have had an intra-abdominal event that is now walled itself off. Given his benign physical examination regarding the abdomen and his normal white count, I will hold on exploratory laparotomy and follow this with conservative management and bowel rest. Should his abdominal exam change, he may need an exploratory laparotomy. We will follow him closely with you.  Shelia Magallon A 10/14/2016, 10:25 PM

## 2016-10-15 DIAGNOSIS — K668 Other specified disorders of peritoneum: Secondary | ICD-10-CM

## 2016-10-15 LAB — BASIC METABOLIC PANEL
ANION GAP: 10 (ref 5–15)
BUN: 7 mg/dL (ref 6–20)
CALCIUM: 8.8 mg/dL — AB (ref 8.9–10.3)
CHLORIDE: 108 mmol/L (ref 101–111)
CO2: 22 mmol/L (ref 22–32)
CREATININE: 0.89 mg/dL (ref 0.61–1.24)
GFR calc non Af Amer: >60 mL/min (ref >60)
GLUCOSE: 111 mg/dL — AB (ref 65–99)
Potassium: 3.3 mmol/L — ABNORMAL LOW (ref 3.5–5.1)
Sodium: 140 mmol/L (ref 135–145)

## 2016-10-15 LAB — GLUCOSE, CAPILLARY
GLUCOSE-CAPILLARY: 108 mg/dL — AB (ref 65–99)
GLUCOSE-CAPILLARY: 88 mg/dL (ref 65–99)
GLUCOSE-CAPILLARY: 86 mg/dL (ref 65–99)
GLUCOSE-CAPILLARY: 89 mg/dL (ref 65–99)
Glucose-Capillary: 102 mg/dL — ABNORMAL HIGH (ref 65–99)
Glucose-Capillary: 120 mg/dL — ABNORMAL HIGH (ref 65–99)

## 2016-10-15 LAB — CBC
HCT: 33.5 % — ABNORMAL LOW (ref 39.0–52.0)
HEMOGLOBIN: 10.1 g/dL — AB (ref 13.0–17.0)
MCH: 24.4 pg — AB (ref 26.0–34.0)
MCHC: 30.1 g/dL (ref 30.0–36.0)
MCV: 80.9 fL (ref 78.0–100.0)
Platelets: 191 10*3/uL (ref 150–400)
RBC: 4.14 MIL/uL — AB (ref 4.22–5.81)
RDW: 19.3 % — ABNORMAL HIGH (ref 11.5–15.5)
WBC: 6.5 10*3/uL (ref 4.0–10.5)

## 2016-10-15 LAB — MAGNESIUM: Magnesium: 2 mg/dL (ref 1.7–2.4)

## 2016-10-15 LAB — PHOSPHORUS: Phosphorus: 3.2 mg/dL (ref 2.5–4.6)

## 2016-10-15 MED ORDER — LORAZEPAM 2 MG/ML IJ SOLN
1.0000 mg | Freq: Four times a day (QID) | INTRAMUSCULAR | Status: DC | PRN
  Administered 2016-10-16 – 2016-10-17 (×2): 1 mg via INTRAVENOUS
  Filled 2016-10-15 (×2): qty 1

## 2016-10-15 MED ORDER — POTASSIUM CHLORIDE CRYS ER 20 MEQ PO TBCR
40.0000 meq | EXTENDED_RELEASE_TABLET | Freq: Once | ORAL | Status: AC
  Administered 2016-10-15: 40 meq via ORAL
  Filled 2016-10-15: qty 2

## 2016-10-15 MED ORDER — HYDROMORPHONE HCL 1 MG/ML IJ SOLN: 1.0000 mg | INTRAMUSCULAR | Status: DC | PRN

## 2016-10-15 NOTE — Progress Notes (Signed)
Patient ID: Barry Mccoy, male   DOB: [birth year], 66 y.o.   MRN: [id number]  PROGRESS NOTE    Barry Mccoy  [name] DOB: [birth year] DOA: 10/01/2016  PCP: No PCP Per Patient   Brief Narrative:  66 year old man with past medical history of migraine headaches (takes keppra), depression who presented to [facility] 10/01/16 after nearly passing out while out shopping. Pt also reported exertional dyspnea, black stools. He was hypotensive in ED and it was thought that this is due to possible GI bleed. He was transfused total of 5 units PRBC during this admission. He developed acute delirium and agitation requiring total of 12 mg ativan which resulted in somnolence and acute respiratory failure with hypoxia and apnea. He was intubated from 1/23 - 1/28. He also required brief precedex infusion. He also had afib/SVT/sinus tachycardia >>placed on amio IV and then converted to sinus tachycardia.   Assessment & Plan:   Acute respiratory failure with hypoxia and inability to protect airways / Aspiration pneumonitis - Somnolence due to multiple ativan doses for agitation, delirium - Intubated form 1/23 through 1/28 - Has received total of 7 days of zosyn and needs no further antibiotics at this time.  - May use as needed [place] oxygen to keep O2 sats above 88%  Delirium secondary to depression / anxiety / alcohol abuse and possible baclofen withdrawal - Precedex drip weaned off 2/1 - Pt was on seroquel at 200 mg BID but concern that this med may have cause SVT. Seroquel dose cut to 100 mg BID on 2/1 per PCCM.  - Also on Zoloft 50 mg daily  - Increased clonazepam to 1 mg TID - Patient noted overnight 2/3 to 2/4 to have hallucinations, delusions, combative, verbally abusive and striking out at staff members.4 point restraints placed. Delirium likely related to alcohol withdrawals   Hypokalemia - Due to alcohol abuse - Continue to supplement - Check potassium in am, check magnesium in am   SVT - Lkely related  to agitation. Patient also noted to have a elevated d-dimer. Check a CT angiogram of the chest to rule out PE. - Started on amiodarone drip on 2/1 but he is now sinus.  - D/C'ed amio 2/1  Anemia of chronic disease / Acute upper GI bleed - Likely related to portal gastropathy.  - S/P EGD 10/04/2016--with portal gastropathy versus inflammatory gastritis. - S/P 5 units PRBC transfusion  - Hgb stable this am  AKI - Resolved with fluids - Cr WNL  Elevated d-dimer - Patient noted to have elevated d-dimer of 3.61 on 10/14/2016.  - CT angio chest negative for PE  Pneumoperitoneum - Noted on CT scan 2/4 - Surgery has seen in consultation; benign abd exam, no evidence of evolving sepsis so surgery not required at this time    DVT prophylaxis: SCD's bilaterally  Code Status: Full Family Communication: Family not at the bedside this am; spoke with pt daughter over the phone this am [phone] Disposition Plan: SNF once mental sattus better and needs to be off of physical restrains    Consultants:   Gastroenterology: Dr. Benson Norway 10/02/2016  PCCM: Dr Nelda Marseille 10/02/2016  Psychiatry 2/4  Surgery 10/14/2016   Procedures:   EGD 1/25>> Portal hypertensive gastropathy versus inflammatory gastritis.  EEG 1/25>> severe diffuse generalized slowing, no seizures  ETT 1/23>>1/28  RIJ CVC 1/25>>  1 unit packed red blood cells 10/01/2016  4 units packed red blood cells 10/02/2016  SIGNIFICANT EVENTS: 1/22 admit, transfused 3 u pRBC 1/23 intubated, transfused 2  u pRBC 1/25 underwent EEG and EGD 1/28 Extubated  Antimicrobials:   IV Zosyn 10/02/2016>>>> 10/09/2016    Subjective: No overnight events.   Objective: Vitals:   10/15/16 0500 10/15/16 0600 10/15/16 0800 10/15/16 1100  BP: (!) 164/83 (!) 173/91    Pulse: 96 93    Resp: 17 16    Temp:   97.8 F (36.6 C) 97.6 F (36.4 C)  TempSrc:   Axillary Oral  SpO2: 98% 100%    Weight:      Height:         Intake/Output Summary (Last 24 hours) at 10/15/16 1329 Last data filed at 10/15/16 1108  Gross per 24 hour  Intake           1987.5 ml  Output             1725 ml  Net            262.5 ml   Filed Weights   10/13/16 0341 10/14/16 0300 10/15/16 0410  Weight: 98.9 kg (218 lb 0.6 oz) 97.9 kg (215 lb 13.3 oz) 96.6 kg (212 lb 15.4 oz)    Examination:  General exam: Appears calm and comfortable but he is disoriented  Respiratory system: Clear to auscultation. Respiratory effort normal. Cardiovascular system: S1 & S2 heard, Rate controlled  Gastrointestinal system: Abdomen is nondistended, soft and nontender. No organomegaly or masses felt. Normal bowel sounds heard. Central nervous system: disoriented, has mittens on Extremities: Symmetric 5 x 5 power. Skin: No rashes, lesions or ulcers Psychiatry: normal behavior, not restless at this time   Data Reviewed: I have personally reviewed following labs and imaging studies  CBC:  Recent Labs Lab 10/11/16 0312 10/12/16 0339 10/13/16 0213 10/14/16 0105 10/15/16 0213  WBC 7.8 6.8 7.5 6.2 6.5  HGB 9.1* 9.7* 9.2* 10.4* 10.1*  HCT 30.7* 32.5* 30.2* 34.1* 33.5*  MCV 82.7 81.7 83.0 81.8 80.9  PLT 147* 160 172 178 [id number]   Basic Metabolic Panel:  Recent Labs Lab 10/11/16 0312 10/12/16 0339 10/13/16 0213 10/14/16 0105 10/15/16 0213  NA 142 141 140 139 140  K 3.0* 3.2* 4.1 3.6 3.3*  CL 112* 108 108 105 108  CO2 24 22 24 24 22   GLUCOSE 129* 117* 110* 116* 111*  BUN 14 9 8 7 7   CREATININE 0.82 0.87 0.88 0.83 0.89  CALCIUM 8.3* 8.6* 8.7* 9.1 8.8*  MG 1.9 2.0 2.2 2.1 2.0  PHOS 2.0* 3.6 3.7 2.5 3.2   GFR: Estimated Creatinine Clearance: 94.9 mL/min (by C-G formula based on SCr of 0.89 mg/dL). Liver Function Tests: No results for input(s): AST, ALT, ALKPHOS, BILITOT, PROT, ALBUMIN in the last 168 hours. No results for input(s): LIPASE, AMYLASE in the last 168 hours.  Recent Labs Lab 10/14/16 0639  AMMONIA 14   Coagulation  Profile: No results for input(s): INR, PROTIME in the last 168 hours. Cardiac Enzymes:  Recent Labs Lab 10/14/16 0108 10/14/16 0635 10/14/16 1220  TROPONINI <0.03 <0.03 <0.03   BNP (last 3 results) No results for input(s): PROBNP in the last 8760 hours. HbA1C: No results for input(s): HGBA1C in the last 72 hours. CBG:  Recent Labs Lab 10/14/16 2024 10/15/16 0007 10/15/16 0356 10/15/16 0838 10/15/16 1116  GLUCAP 98 120* 108* 88 102*   Lipid Profile: No results for input(s): CHOL, HDL, LDLCALC, TRIG, CHOLHDL, LDLDIRECT in the last 72 hours. Thyroid Function Tests:  Recent Labs  10/14/16 0108  TSH 3.081   Anemia Panel: No results for input(s):  VITAMINB12, FOLATE, FERRITIN, TIBC, IRON, RETICCTPCT in the last 72 hours. Urine analysis:    Component Value Date/Time   COLORURINE YELLOW 10/14/2016 0505   APPEARANCEUR CLEAR 10/14/2016 0505   LABSPEC 1.012 10/14/2016 0505   PHURINE 7.0 10/14/2016 0505   GLUCOSEU NEGATIVE 10/14/2016 0505   HGBUR NEGATIVE 10/14/2016 0505   BILIRUBINUR NEGATIVE 10/14/2016 0505   KETONESUR NEGATIVE 10/14/2016 0505   PROTEINUR NEGATIVE 10/14/2016 0505   NITRITE NEGATIVE 10/14/2016 0505   LEUKOCYTESUR NEGATIVE 10/14/2016 0505   Sepsis Labs: @LABRCNTIP (procalcitonin:4,lacticidven:4)   )No results found for this or any previous visit (from the past 240 hour(s)).    Radiology Studies: Ct Head Wo Contrast Result Date: 10/14/2016 No acute finding or change from prior.   Ct Angio Chest Pe W Or Wo Contrast Result Date: 10/14/2016 1. Free intraperitoneal air within the upper abdomen. In the absence of a recent surgery, this suggests viscus perforation. CT abdomen and pelvis is recommended for further characterization. 2. No pulmonary embolism seen, with study limitations detailed above. 3. Small right pleural effusion. Mild bibasilar atelectasis. Mild atelectasis versus edema within the upper lobes bilaterally. No evidence of pneumonia. 4. Free  fluid in the left upper abdomen. Mass versus fluid collection adjacent to the pancreatic tail, incompletely imaged. Cholelithiasis, incompletely imaged. 5. Aortic atherosclerosis.   Ct Abdomen Pelvis W Contrast Result Date: 10/14/2016 1. No definitive source for the patient's moderate pneumoperitoneum. Given there is no inflammatory changes or visible ulcer along the stomach or proximal duodenum, a diverticular source is favored. There may be mild diverticulitis along the splenic flexure, but no extraluminal gas in this region. 2. 26 mm fluid collection in the splenic fossa. Suspect pseudocyst as there is ductal or parenchymal calcification in the pancreatic tail. Pancreatic tail is full and pancreas protocol CT or MRI follow-up is recommended after convalescence to exclude mass. 3. 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 Patient was admitted with GI bleeding. 4. Cholelithiasis. 5. Suspected cirrhosis. 6. Splenomegaly.    Scheduled Meds: . baclofen  40 mg Oral QHS  . clonazePAM  1 mg Oral TID  . feeding supplement (ENSURE ENLIVE)  237 mL Oral BID BM  . folic acid  1 mg Oral Daily  . levETIRAcetam  500 mg Oral Daily  . mouth rinse  15 mL Mouth Rinse BID  . nicotine  14 mg Transdermal Daily  . pantoprazole  40 mg Oral QHS  . potassium chloride  40 mEq Oral Once  . QUEtiapine  100 mg Oral BID  . sertraline  50 mg Oral Daily  . thiamine  100 mg Oral Daily   Continuous Infusions: . dexmedetomidine Stopped (10/11/16 0500)  . sodium chloride 0.9 % 1,000 mL infusion 125 mL/hr at 10/15/16 0019     LOS: 14 days    Time spent: 25 minutes  Greater than 50% of the time spent on counseling and coordinating the care.   Leisa Lenz, MD [facility] Pager [phone]  If 7PM-7AM, please contact night-coverage [email or web address] Password [email or web address] 10/15/2016, 1:29 PM

## 2016-10-15 NOTE — Care Management Important Message (Signed)
Important Message  Patient Details  Name: Barry Mccoy MRN: [id number] Date of Birth: December 16, 1950   Medicare Important Message Given:  Yes    Catlynn Grondahl Montine Circle 10/15/2016, 4:34 PM

## 2016-10-15 NOTE — Progress Notes (Signed)
Spoke with [name], pt's daughter, and she expressed that patient is being sedated and that made him more confused.  She don't mind patient being restraint but she don't want him to be sedated because that made him more confused.  Explained to her that I have not given him any sedation but at this time he is asleep.  He was also restraint to prevent him from injury and she stated that she don't mind him being restraint.  She also spoke with the Education officer, museum, Tammy Sours.

## 2016-10-15 NOTE — Progress Notes (Signed)
CSW spoke with pt dtr concerning pt current care.  Dtr feels strongly that the sedatives being given to the patient here in the hospital is hurting his progress and making him more agitated- would like to discuss with MD to see if we can stop having sedatives ordered for patient and revert to restraints if there are safety concerns.  CSW informed MD of pt dtr request to speak concerning pt care  Jorge Ny, [place] Social Worker [phone]

## 2016-10-15 NOTE — Progress Notes (Signed)
S: No acute events  Vitals, labs, intake/output, and orders reviewed at this time. Afebrile. HR 93. Saturating well on room air. No tachypnea, BP 173/91.  540 PO intake recorded yesterday, currently NPO. WBC 6.5. He is on no abx.  Gen: Somnolent, restraints in place Chest: unlabored respirations, RRR Abd: soft, not tender or distended whatsoever, deep palpation barely stirs him Ext: warm, no edema Neuro: grossly normal  Lines/tubes/drains: PIV  A/P:  Incidentally discovered free air of unknown etiology and duration. Continues to have normal labs, afebrile, completely benign abdominal exam and no evidence of evolving sepsis to suggest intraabdominal contamination. No plans for surgery unless anything changes. Will continue to follow.    Romana Juniper, MD [facility], [place] Pager [phone]

## 2016-10-15 NOTE — Progress Notes (Signed)
26M CSW received message from pt dtr stating they would like [facility] in [place] faxed referral to facility for review  Pt remains in restraints- will need to be without restraints for 24 hours prior to admission to SNF.  CSW will continue to follow  Jorge Ny, LCSW Clinical Social Worker [phone]

## 2016-10-15 NOTE — Plan of Care (Signed)
Problem: Education: Goal: Knowledge of [facility] Education information/materials will improve Outcome: Progressing Discussed with patient about plan of care for evening especially his safety since no sitter is available at this time with no teach back displayed at this time.

## 2016-10-15 NOTE — Consult Note (Signed)
[facility] Consult   Reason for Consult:  Delusion, verbally abusive, hallucinations Referring Physician:  Dr. Grandville Silos Patient Identification: Barry Mccoy MRN:  [id number] Principal Diagnosis: Acute GI bleeding Diagnosis:   Patient Active Problem List   Diagnosis Date Noted  . Acute encephalopathy [G93.40]   . Positive D dimer [R79.89]   . Severe single current episode of major depressive disorder, with psychotic features ([place]) [F32.3]   . Delirium [R41.0]   . Acute respiratory failure ([place]) [J96.00]   . Symptomatic anemia [D64.9] 10/01/2016  . Acute GI bleeding [K92.2] 10/01/2016    Total Time spent with patient: 45 minutes  Subjective:   Barry Mccoy is a 66 y.o. male patient admitted with acute GI bleeding  HPI: Patient is confused, disoriented, combative and unable to give a coherent history. Chart review indicates that patient has history of major depression with psychosis. He was admitted to the medical unit on 10/01/16 after nearly passing out while out shopping. He was hypotensive and had to be transfused 5 units of PRBC. He later developed agitation and delirium. Further history could not be obtained from the patient due to being incoherent and confused.  Past Psychiatric History: MDD with psychosis  Interval history: Patient seen briefly for psychiatric consultation follow-up. Reviewed initial psychiatric consultation completed by Dr. Darleene Cleaver and briefly discussed with the Dr. Charlies Silvers. Patient continued to be encephalopathic and a poor historian and not able to contribute much for this evaluation. Patient continued to have mild irritability, agitation and delirium state and incoherent. May contact psychiatric consultation if needed and also agreed Haldol when necessary for agitation.  Risk to Self: Is patient at risk for suicide?: No Risk to Others:   Prior Inpatient Therapy:   Prior Outpatient Therapy:    Past Medical History:  Past Medical History:   Diagnosis Date  . Muscle spasm     Past Surgical History:  Procedure Laterality Date  . ELBOW SURGERY    . ESOPHAGOGASTRODUODENOSCOPY N/A 10/04/2016   Procedure: ESOPHAGOGASTRODUODENOSCOPY (EGD);  Surgeon: Carol Ada, MD;  Location: [facility];  Service: Gastroenterology;  Laterality: N/A;   Family History:  Family History  Problem Relation Age of Onset  . Colon cancer Brother    Family Psychiatric  History:  Social History:  History  Alcohol Use No     History  Drug Use No    Social History   Social History  . Marital status: Widowed    Spouse name: N/A  . Number of children: N/A  . Years of education: N/A   Social History Main Topics  . Smoking status: Current Every Day Smoker    Packs/day: 1.00    Types: Cigarettes  . Smokeless tobacco: Never Used  . Alcohol use No  . Drug use: No  . Sexual activity: Not Asked   Other Topics Concern  . None   Social History Narrative  . None   Additional Social History:    Allergies:   Allergies  Allergen Reactions  . Shellfish Allergy Other (See Comments)    Unknown     Labs:  Results for orders placed or performed during the hospital encounter of 10/01/16 (from the past 48 hour(s))  Glucose, capillary     Status: Abnormal   Collection Time: 10/13/16  5:45 PM  Result Value Ref Range   Glucose-Capillary 126 (H) 65 - 99 mg/dL  Glucose, capillary     Status: None   Collection Time: 10/13/16  7:44 PM  Result Value Ref Range  Glucose-Capillary 83 65 - 99 mg/dL   Comment 1 Notify RN   Glucose, capillary     Status: Abnormal   Collection Time: 10/13/16 11:45 PM  Result Value Ref Range   Glucose-Capillary 113 (H) 65 - 99 mg/dL   Comment 1 Notify RN   Basic metabolic panel     Status: Abnormal   Collection Time: 10/14/16  1:05 AM  Result Value Ref Range   Sodium 139 135 - 145 mmol/L   Potassium 3.6 3.5 - 5.1 mmol/L   Chloride 105 101 - 111 mmol/L   CO2 24 22 - 32 mmol/L   Glucose, Bld 116 (H) 65 - 99  mg/dL   BUN 7 6 - 20 mg/dL   Creatinine, Ser 0.83 0.61 - 1.24 mg/dL   Calcium 9.1 8.9 - 10.3 mg/dL   GFR calc non Af Amer >60 >60 mL/min   GFR calc Af Amer >60 >60 mL/min    Comment: (NOTE) The eGFR has been calculated using the CKD EPI equation. This calculation has not been validated in all clinical situations. eGFR's persistently <60 mL/min signify possible Chronic Kidney Disease.    Anion gap 10 5 - 15  CBC     Status: Abnormal   Collection Time: 10/14/16  1:05 AM  Result Value Ref Range   WBC 6.2 4.0 - 10.5 K/uL   RBC 4.17 (L) 4.22 - 5.81 MIL/uL   Hemoglobin 10.4 (L) 13.0 - 17.0 g/dL   HCT 34.1 (L) 39.0 - 52.0 %   MCV 81.8 78.0 - 100.0 fL   MCH 24.9 (L) 26.0 - 34.0 pg   MCHC 30.5 30.0 - 36.0 g/dL   RDW 19.0 (H) 11.5 - 15.5 %   Platelets 178 150 - 400 K/uL  Magnesium     Status: None   Collection Time: 10/14/16  1:05 AM  Result Value Ref Range   Magnesium 2.1 1.7 - 2.4 mg/dL  Phosphorus     Status: None   Collection Time: 10/14/16  1:05 AM  Result Value Ref Range   Phosphorus 2.5 2.5 - 4.6 mg/dL  Troponin I (q 6hr x 3)     Status: None   Collection Time: 10/14/16  1:08 AM  Result Value Ref Range   Troponin I <0.03 <0.03 ng/mL  TSH     Status: None   Collection Time: 10/14/16  1:08 AM  Result Value Ref Range   TSH 3.081 0.350 - 4.500 uIU/mL    Comment: Performed by a 3rd Generation assay with a functional sensitivity of <=0.01 uIU/mL.  D-dimer, quantitative (not at [facility])     Status: Abnormal   Collection Time: 10/14/16  1:08 AM  Result Value Ref Range   D-Dimer, Quant 3.61 (H) 0.00 - 0.50 ug/mL-FEU    Comment: (NOTE) At the manufacturer cut-off of 0.50 ug/mL FEU, this assay has been documented to exclude PE with a sensitivity and negative predictive value of 97 to 99%.  At this time, this assay has not been approved by the FDA to exclude DVT/VTE. Results should be correlated with clinical presentation.   Glucose, capillary     Status: Abnormal   Collection  Time: 10/14/16  3:38 AM  Result Value Ref Range   Glucose-Capillary 135 (H) 65 - 99 mg/dL   Comment 1 Notify RN   Urinalysis, Routine w reflex microscopic     Status: None   Collection Time: 10/14/16  5:05 AM  Result Value Ref Range   Color, Urine YELLOW YELLOW  APPearance CLEAR CLEAR   Specific Gravity, Urine 1.012 1.005 - 1.030   pH 7.0 5.0 - 8.0   Glucose, UA NEGATIVE NEGATIVE mg/dL   Hgb urine dipstick NEGATIVE NEGATIVE   Bilirubin Urine NEGATIVE NEGATIVE   Ketones, ur NEGATIVE NEGATIVE mg/dL   Protein, ur NEGATIVE NEGATIVE mg/dL   Nitrite NEGATIVE NEGATIVE   Leukocytes, UA NEGATIVE NEGATIVE  Troponin I (q 6hr x 3)     Status: None   Collection Time: 10/14/16  6:35 AM  Result Value Ref Range   Troponin I <0.03 <0.03 ng/mL  Ammonia     Status: None   Collection Time: 10/14/16  6:39 AM  Result Value Ref Range   Ammonia 14 9 - 35 umol/L  Glucose, capillary     Status: Abnormal   Collection Time: 10/14/16  7:52 AM  Result Value Ref Range   Glucose-Capillary 145 (H) 65 - 99 mg/dL  Troponin I (q 6hr x 3)     Status: None   Collection Time: 10/14/16 12:20 PM  Result Value Ref Range   Troponin I <0.03 <0.03 ng/mL  Glucose, capillary     Status: Abnormal   Collection Time: 10/14/16  4:35 PM  Result Value Ref Range   Glucose-Capillary 103 (H) 65 - 99 mg/dL  Glucose, capillary     Status: None   Collection Time: 10/14/16  8:24 PM  Result Value Ref Range   Glucose-Capillary 98 65 - 99 mg/dL  Glucose, capillary     Status: Abnormal   Collection Time: 10/15/16 12:07 AM  Result Value Ref Range   Glucose-Capillary 120 (H) 65 - 99 mg/dL  Basic metabolic panel     Status: Abnormal   Collection Time: 10/15/16  2:13 AM  Result Value Ref Range   Sodium 140 135 - 145 mmol/L   Potassium 3.3 (L) 3.5 - 5.1 mmol/L   Chloride 108 101 - 111 mmol/L   CO2 22 22 - 32 mmol/L   Glucose, Bld 111 (H) 65 - 99 mg/dL   BUN 7 6 - 20 mg/dL   Creatinine, Ser 0.89 0.61 - 1.24 mg/dL   Calcium 8.8  (L) 8.9 - 10.3 mg/dL   GFR calc non Af Amer >60 >60 mL/min   GFR calc Af Amer >60 >60 mL/min    Comment: (NOTE) The eGFR has been calculated using the CKD EPI equation. This calculation has not been validated in all clinical situations. eGFR's persistently <60 mL/min signify possible Chronic Kidney Disease.    Anion gap 10 5 - 15  CBC     Status: Abnormal   Collection Time: 10/15/16  2:13 AM  Result Value Ref Range   WBC 6.5 4.0 - 10.5 K/uL   RBC 4.14 (L) 4.22 - 5.81 MIL/uL   Hemoglobin 10.1 (L) 13.0 - 17.0 g/dL   HCT 33.5 (L) 39.0 - 52.0 %   MCV 80.9 78.0 - 100.0 fL   MCH 24.4 (L) 26.0 - 34.0 pg   MCHC 30.1 30.0 - 36.0 g/dL   RDW 19.3 (H) 11.5 - 15.5 %   Platelets 191 150 - 400 K/uL  Magnesium     Status: None   Collection Time: 10/15/16  2:13 AM  Result Value Ref Range   Magnesium 2.0 1.7 - 2.4 mg/dL  Phosphorus     Status: None   Collection Time: 10/15/16  2:13 AM  Result Value Ref Range   Phosphorus 3.2 2.5 - 4.6 mg/dL  Glucose, capillary     Status: Abnormal  Collection Time: 10/15/16  3:56 AM  Result Value Ref Range   Glucose-Capillary 108 (H) 65 - 99 mg/dL  Glucose, capillary     Status: None   Collection Time: 10/15/16  8:38 AM  Result Value Ref Range   Glucose-Capillary 88 65 - 99 mg/dL  Glucose, capillary     Status: Abnormal   Collection Time: 10/15/16 11:16 AM  Result Value Ref Range   Glucose-Capillary 102 (H) 65 - 99 mg/dL    Current Facility-Administered Medications  Medication Dose Route Frequency Provider Last Rate Last Dose  . baclofen (LIORESAL) tablet 40 mg  40 mg Oral QHS Jose Shirl Harris, MD   40 mg at 10/14/16 2017  . clonazePAM (KLONOPIN) tablet 1 mg  1 mg Oral TID Robbie Lis, MD   1 mg at 10/15/16 6606  . dexmedetomidine (PRECEDEX) 200 MCG/50ML (4 mcg/mL) infusion  0.2-1.5 mcg/kg/hr Intravenous Titrated Anders Simmonds, MD   Stopped at 10/11/16 0500  . docusate (COLACE) 50 MG/5ML liquid 100 mg  100 mg Oral BID PRN Milagros Loll, MD       . feeding supplement (ENSURE ENLIVE) (ENSURE ENLIVE) liquid 237 mL  237 mL Oral BID BM Jose Shirl Harris, MD   237 mL at 10/15/16 0929  . fentaNYL (SUBLIMAZE) injection 12.5-25 mcg  12.5-25 mcg Intravenous Q2H PRN Milagros Loll, MD   25 mcg at 10/15/16 [phone]  . folic acid (FOLVITE) tablet 1 mg  1 mg Oral Daily Praveen Mannam, MD   1 mg at 10/15/16 0924  . haloperidol lactate (HALDOL) injection 5 mg  5 mg Intravenous Q6H PRN Milagros Loll, MD   5 mg at 10/14/16 0314  . levETIRAcetam (KEPPRA XR) 24 hr tablet 500 mg  500 mg Oral Daily Flora Lipps, MD   500 mg at 10/15/16 0924  . LORazepam (ATIVAN) injection 1 mg  1 mg Intravenous Q3H PRN Jose Shirl Harris, MD   1 mg at 10/15/16 0433  . LORazepam (ATIVAN) injection 1 mg  1 mg Intramuscular Q3H PRN Jani Gravel, MD   1 mg at 10/14/16 0109  . MEDLINE mouth rinse  15 mL Mouth Rinse BID Praveen Mannam, MD   15 mL at 10/15/16 0933  . metoprolol (LOPRESSOR) injection 2.5-5 mg  2.5-5 mg Intravenous Q3H PRN Eugenie Filler, MD      . nicotine (NICODERM CQ - dosed in mg/24 hours) patch 14 mg  14 mg Transdermal Daily Praveen Mannam, MD   14 mg at 10/15/16 0925  . pantoprazole (PROTONIX) EC tablet 40 mg  40 mg Oral QHS Jose Shirl Harris, MD   40 mg at 10/14/16 2017  . QUEtiapine (SEROQUEL) tablet 100 mg  100 mg Oral BID Jose Shirl Harris, MD   100 mg at 10/15/16 3235  . sennosides (SENOKOT) 8.8 MG/5ML syrup 5 mL  5 mL Oral QHS PRN Milagros Loll, MD      . sertraline (ZOLOFT) tablet 50 mg  50 mg Oral Daily Rodney, MD   50 mg at 10/14/16 1300  . sodium chloride 0.9 % 1,000 mL infusion   Intravenous Continuous Eugenie Filler, MD 125 mL/hr at 10/15/16 0019    . thiamine (VITAMIN B-1) tablet 100 mg  100 mg Oral Daily Plummer, MD   100 mg at 10/15/16 5732    Musculoskeletal: Strength & Muscle Tone: not tested Gait & Station:  unable to stand Patient leans: N/A  Psychiatric Specialty Exam: Physical Exam   Psychiatric: Thought content normal. His mood appears anxious. His speech is tangential and slurred. He is combative. He expresses impulsivity.    Review of Systems  Unable to perform ROS: Mental status change    Blood pressure (!) 173/91, pulse 93, temperature 97.6 F (36.4 C), temperature source Oral, resp. rate 16, height _0  (1.88 m), weight 96.6 kg (212 lb 15.4 oz), SpO2 100 %.Body mass index is 27.34 kg/m.  General Appearance: Casual  Eye Contact:  Minimal  Speech:  Pressured and Slurred  Volume:  Increased  Mood:  Anxious  Affect:  Labile  Thought Process:  Disorganized  Orientation:  Other:  unable to assess  Thought Content:  Illogical  Suicidal Thoughts:  unable to assess  Homicidal Thoughts:  No  Memory:  unable to assess  Judgement:  Other:  unable to assess  Insight:  unable to assess  Psychomotor Activity:  Increased and Restlessness  Concentration:  Concentration: Poor and Attention Span: Poor  Recall:  Poor  Fund of Knowledge:  Fair  Language:  Fair  Akathisia:  No  Handed:  Right  AIMS (if indicated):     Assets:  Social Support  ADL's:  Impaired  Cognition: unable to assess   Sleep:        Treatment Plan Summary: Daily contact with patient to assess and evaluate symptoms and progress in treatment   Diagnosis: 1. Acute Delirium 2. History of MDD  Recommendations; Neurology referral for delirium and acute confusional state Agreed with decreasing Seroquel to 100 mg bid. Wean patient off Clonazepam due to confusion/disorientation Lorazepam is a better alternative for agitation in elderly Continue Zoloft 50 mg daily for depression. Agreed with PRN haldol for agitation.   Disposition: Patient does not meet criteria for psychiatric inpatient admission. Supportive therapy provided about ongoing stressors.  Re-consult psych unit when necessary.  Ambrose Finland, MD 10/15/2016 12:20 PM

## 2016-10-15 NOTE — Progress Notes (Signed)
Spoke with pt daughter for 35 minutes over the phone about pt care. She wanted sedating medications discontinued and we talked about fentanyl, haldol and ativan. I stopped fentanyl and haldol; added dilaudid and changed ativan to less frequent every 6 hours as needed instead of every 3 hours as needed. Order placed for sitter at bedside D/C ankle restraints. Pt has RLS and wants to move his legs. Restraints will only make him more agitated. If pt more agitated throughout the night you may give extra ativan but please notify oncall NP or MD to make sure it is okay to give those meds.  Leisa Lenz [facility] [phone]

## 2016-10-15 NOTE — Progress Notes (Signed)
Patient displaying aggressive behavior and swing with all restraints off.  As previous even with out medications and non-invasive measures such as distraction, music and reorientation.  The patient continues to slid to end of the bed attempting to crawl out of bed. Over the past several days patient x-rays, urine tests and other explanations for his increased confusion and hallucinations of being in "[place], seeing fire along the walls, and speaking to people not in the room."  Psyche recommended a Neurology consult.  Patient is still continually yelling out and screaming which can be heard on the opposite hall.

## 2016-10-16 LAB — BASIC METABOLIC PANEL
Anion gap: 11 (ref 5–15)
BUN: 8 mg/dL (ref 6–20)
CALCIUM: 9.2 mg/dL (ref 8.9–10.3)
CO2: 22 mmol/L (ref 22–32)
Chloride: 107 mmol/L (ref 101–111)
Creatinine, Ser: 0.95 mg/dL (ref 0.61–1.24)
GFR calc Af Amer: >60 mL/min (ref >60)
GLUCOSE: 103 mg/dL — AB (ref 65–99)
Potassium: 3.6 mmol/L (ref 3.5–5.1)
Sodium: 140 mmol/L (ref 135–145)

## 2016-10-16 LAB — GLUCOSE, CAPILLARY
GLUCOSE-CAPILLARY: 96 mg/dL (ref 65–99)
Glucose-Capillary: 76 mg/dL (ref 65–99)
Glucose-Capillary: 84 mg/dL (ref 65–99)
Glucose-Capillary: 97 mg/dL (ref 65–99)
Glucose-Capillary: 97 mg/dL (ref 65–99)

## 2016-10-16 LAB — MAGNESIUM: Magnesium: 1.9 mg/dL (ref 1.7–2.4)

## 2016-10-16 LAB — CBC
HCT: 35.1 % — ABNORMAL LOW (ref 39.0–52.0)
Hemoglobin: 10.7 g/dL — ABNORMAL LOW (ref 13.0–17.0)
MCH: 24.9 pg — ABNORMAL LOW (ref 26.0–34.0)
MCHC: 30.5 g/dL (ref 30.0–36.0)
MCV: 81.6 fL (ref 78.0–100.0)
Platelets: 222 10*3/uL (ref 150–400)
RBC: 4.3 MIL/uL (ref 4.22–5.81)
RDW: 19.1 % — ABNORMAL HIGH (ref 11.5–15.5)
WBC: 7.3 10*3/uL (ref 4.0–10.5)

## 2016-10-16 MED ORDER — TRAMADOL HCL 50 MG PO TABS
50.0000 mg | ORAL_TABLET | Freq: Four times a day (QID) | ORAL | Status: DC | PRN
  Administered 2016-10-16 – 2016-10-17 (×2): 50 mg via ORAL
  Filled 2016-10-16 (×2): qty 1

## 2016-10-16 MED ORDER — METOPROLOL TARTRATE 5 MG/5ML IV SOLN
5.0000 mg | INTRAVENOUS | Status: DC | PRN
  Administered 2016-10-17: 5 mg via INTRAVENOUS
  Filled 2016-10-16: qty 5

## 2016-10-16 MED ORDER — RESOURCE THICKENUP CLEAR PO POWD
ORAL | Status: DC | PRN
  Filled 2016-10-16: qty 125

## 2016-10-16 NOTE — Progress Notes (Addendum)
3pm CSW spoke with pt dtr about potential barriers to transfer to [place], [place] due to cost of transport.  Dtr agreeable to local bed search in case patient is not clear enough to come to [place] area with her at time of DC.  CSW sent out referral to local facilities for review  12pm CSW continuing to follow for SNF placement  Left message for admissions at [facility] in [place], [place]- awaiting response.  Faxed referral to [facility] in [place], [place]- left message for admissions  PASAR still pending- MD to paged to sign 30 note on chart which is needed for PASAR approval  Jorge Ny, [place] Social Worker [phone]
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
## 2016-10-16 NOTE — Progress Notes (Signed)
Physical Therapy Treatment Patient Details Name: Barry Mccoy MRN: [id number] DOB: May 30, 1951 Today's Date: 10/16/2016    History of Present Illness Pt is a 66 yo male admitted for syncope and GI bleeding. Pt found to have Acute Encephalopathy and intermittent hypoxia. VDRF 1/23-1/28. Pt with know ETOH abuse.    PT Comments    Pt making very slow progress due to continued impaired cognition and arousal.   Follow Up Recommendations  SNF;Supervision/Assistance - 24 hour     Equipment Recommendations  Other (comment) (TBD)    Recommendations for Other Services       Precautions / Restrictions Precautions Precautions: Fall Precaution Comments: posey, lethargic from meds Restrictions Weight Bearing Restrictions: No    Mobility  Bed Mobility Overal bed mobility: Needs Assistance Bed Mobility: Supine to Sit;Sit to Sidelying     Supine to sit: +2 for physical assistance;Total assist   Sit to sidelying: +2 for physical assistance;Max assist General bed mobility comments: Assist for all aspects due to lethargy  Transfers Overall transfer level: Needs assistance Equipment used: Rolling walker (2 wheeled) Transfers: Sit to/from Stand Sit to Stand: +2 physical assistance;Mod assist         General transfer comment: Assist to bring hips up and for balance. Had pt place hands on walker to encourage anterior wt shift  Ambulation/Gait             General Gait Details: Unable to take any steps with walker   Stairs            Wheelchair Mobility    Modified Rankin (Stroke Patients Only)       Balance Overall balance assessment: Needs assistance Sitting-balance support: Feet supported;Bilateral upper extremity supported Sitting balance-Leahy Scale: Poor Sitting balance - Comments: Pt sat EOB with min to mod A Postural control: Right lateral lean;Posterior lean;Left lateral lean Standing balance support: Bilateral upper extremity supported Standing  balance-Leahy Scale: Poor Standing balance comment: walker and +2 min A to maintain static standing x 45 sec                    Cognition Arousal/Alertness: Lethargic Behavior During Therapy: Flat affect Overall Cognitive Status: Impaired/Different from baseline Area of Impairment: Following commands;Problem solving;Safety/judgement;Memory;Attention;Orientation Orientation Level: Time;Place;Situation Current Attention Level: Focused Memory: Decreased short-term memory Following Commands: Follows one step commands with increased time;Follows one step commands inconsistently Safety/Judgement: Decreased awareness of safety;Decreased awareness of deficits   Problem Solving: Slow processing;Difficulty sequencing;Requires verbal cues;Requires tactile cues;Decreased initiation General Comments: Pt required frequent verbal/tactile stimulation to arouse    Exercises      General Comments        Pertinent Vitals/Pain Pain Assessment: Faces Faces Pain Scale: No hurt    Home Living                      Prior Function            PT Goals (current goals can now be found in the care plan section) Progress towards PT goals: Progressing toward goals (very slowly)    Frequency    Min 2X/week      PT Plan Current plan remains appropriate;Frequency needs to be updated    Co-evaluation             End of Session Equipment Utilized During Treatment: Gait belt Activity Tolerance: Patient limited by lethargy Patient left: with call bell/phone within reach;with restraints reapplied;in bed;with bed alarm set (posey in place)  Time: [id number] PT Time Calculation (min) (ACUTE ONLY): 25 min  Charges:  $Therapeutic Activity: 23-37 mins                    G CodesShary Decamp [facility] 10/28/16, 12:38 PM [employer] PT [phone]

## 2016-10-16 NOTE — Plan of Care (Signed)
Problem: Health Behavior/Discharge Planning: Goal: Ability to manage health-related needs will improve Outcome: Progressing Discussed with patient and daughter Barry Mccoy about patients behaviors over the past few nights and approaches we would take tonight to keep him safe and reoriented with some teach back displayed

## 2016-10-16 NOTE — Progress Notes (Signed)
12 Days Post-Op  Subjective: Still confused, speech is clear and he is not delirious now.  Reports some pain different places in the past, but none on exam currently.    Objective: Vital signs in last 24 hours: Temp:  [97.9 F (36.6 C)-98.1 F (36.7 C)] 97.9 F (36.6 C) (02/06 0807) Pulse Rate:  [84-93] 91 (02/06 0807) Resp:  [10-22] 15 (02/06 0807) BP: (124-179)/(67-95) 145/73 (02/06 0807) SpO2:  [92 %-100 %] 98 % (02/06 0807) Weight:  [97.2 kg (214 lb 4.6 oz)] 97.2 kg (214 lb 4.6 oz) (02/06 0313) Last BM Date: 10/15/16 360 PO yesterday recorded 3125 IV fluids 1550 urine BM x 4 VSS he is afebrile,  NO labs this AM CT scan 10/14/16: 1. No definitive source for the patient's moderate pneumoperitoneum. Given there is no inflammatory changes or visible ulcer along the stomach or proximal duodenum, a diverticular source is favored. There may be mild diverticulitis along the splenic flexure, but no extraluminal gas in this region. 2. 26 mm fluid collection in the splenic fossa. Suspect pseudocyst as there is ductal or parenchymal calcification in the pancreatic tail. Pancreatic tail is full and pancreas protocol CT or MRI follow-up is recommended after convalescence to exclude mass. 3. 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 Patient was admitted with GI bleeding. 4. Cholelithiasis. 5. Suspected cirrhosis. 6. Splenomegaly.    Intake/Output from previous day: 02/05 0701 - 02/06 0700 In: 3485 [P.O.:360; I.V.:3125] Out: 1550 [Urine:1550] Intake/Output this shift: Total I/O In: 250 [I.V.:250] Out: -   General appearance: alert, cooperative and no distress GI: soft, non-tender; bowel sounds normal; no masses,  no organomegaly  Lab Results:   Recent Labs  10/14/16 0105 10/15/16 0213  WBC 6.2 6.5  HGB 10.4* 10.1*  HCT 34.1* 33.5*  PLT 178 191    BMET  Recent Labs  10/14/16 0105 10/15/16 0213  NA 139 140  K 3.6 3.3*  CL 105  108  CO2 24 22  GLUCOSE 116* 111*  BUN 7 7  CREATININE 0.83 0.89  CALCIUM 9.1 8.8*   PT/INR No results for input(s): LABPROT, INR in the last 72 hours.  No results for input(s): AST, ALT, ALKPHOS, BILITOT, PROT, ALBUMIN in the last 168 hours.   Lipase     Component Value Date/Time   LIPASE 40 10/01/2016 2112     Studies/Results: Ct Head Wo Contrast  Result Date: 10/14/2016 CLINICAL DATA:  Altered mental status. EXAM: CT HEAD WITHOUT CONTRAST TECHNIQUE: Contiguous axial images were obtained from the base of the skull through the vertex without intravenous contrast. COMPARISON:  10/03/2016 FINDINGS: Brain: No evidence of acute infarction, hemorrhage, hydrocephalus, or mass lesion/mass effect. Small hygroma posterior to the left cerebellum is stable at 4 mm maximal thickness. No significant mass effect. Stable pattern of mild chronic microvascular disease. Dilated perivascular space versus remote lacune along the lower right putamen. Vascular: Atherosclerotic calcification.  No hyperdense vessel. Skull: No acute finding Sinuses/Orbits: Negative IMPRESSION: No acute finding or change from prior. Electronically Signed   By: Monte Fantasia M.D.   On: 10/14/2016 16:04   Ct Angio Chest Pe W Or Wo Contrast  Result Date: 10/14/2016 CLINICAL DATA:  Hypoxia, agitation, SVT, elevated D-dimer. EXAM: CT ANGIOGRAPHY CHEST WITH CONTRAST TECHNIQUE: Multidetector CT imaging of the chest was performed using the standard protocol during bolus administration of intravenous contrast. Multiplanar CT image reconstructions and MIPs were obtained to evaluate the vascular anatomy. CONTRAST:  100 cc Isovue 370 COMPARISON:  None.  FINDINGS: Cardiovascular: Beam hardening artifact, presumably from patient's arms, considerably limits characterization of the peripheral segmental and subsegmental pulmonary artery branches to the lower lobes bilaterally. I cannot exclude small peripheral pulmonary embolism. There is no  pulmonary embolism identified within the main, lobar or central segmental pulmonary arteries bilaterally. Heart size is normal. No pericardial effusion. No aortic aneurysm or dissection. Mild atherosclerotic changes noted along the walls of the descending thoracic aorta. Coronary artery calcifications noted. Mediastinum/Nodes: No mass or enlarged lymph nodes within the mediastinum or perihilar regions. Esophagus appears normal. Trachea and central bronchi are unremarkable. Lungs/Pleura: Small right pleural effusion with adjacent compressive atelectasis. Mild atelectasis at the left lung base. Hazy ground-glass opacities within the upper lobes bilaterally, likely additional atelectasis or perhaps mild edema. No pneumothorax. 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 Upper Abdomen: Free intraperitoneal air. Small amount of free fluid in the left upper quadrant. Mass versus fluid collection adjacent to the pancreatic tail, incompletely imaged. Multiple gallstones within the gallbladder, incompletely imaged. Musculoskeletal: No acute or suspicious osseous lesion. Mild degenerative spurring within the thoracic spine. Superficial soft tissues are unremarkable. Review of the MIP images confirms the above findings. IMPRESSION: 1. Free intraperitoneal air within the upper abdomen. In the absence of a recent surgery, this suggests viscus perforation. CT abdomen and pelvis is recommended for further characterization. 2. No pulmonary embolism seen, with study limitations detailed above. 3. Small right pleural effusion. Mild bibasilar atelectasis. Mild atelectasis versus edema within the upper lobes bilaterally. No evidence of pneumonia. 4. Free fluid in the left upper abdomen. Mass versus fluid collection adjacent to the pancreatic tail, incompletely imaged. Cholelithiasis, incompletely imaged. 5. Aortic atherosclerosis. Critical Value/emergent results were called by telephone at the time of  interpretation on 10/14/2016 at 4:25 pm to Dr. Irine Seal , who verbally acknowledged these results. Electronically Signed   By: Franki Cabot M.D.   On: 10/14/2016 16:29   Ct Abdomen Pelvis W Contrast  Addendum Date: 10/14/2016   ADDENDUM REPORT: 10/14/2016 21:30 ADDENDUM: Findings discussed with K. Schorr 10/14/2016  at 9:30 pm. Electronically Signed   By: Monte Fantasia M.D.   On: 10/14/2016 21:30   Result Date: 10/14/2016 CLINICAL DATA:  Follow-up chest CT.  Free air. EXAM: CT ABDOMEN AND PELVIS WITH CONTRAST TECHNIQUE: Multidetector CT imaging of the abdomen and pelvis was performed using the standard protocol following bolus administration of intravenous contrast. CONTRAST:  77mL ISOVUE-300 IOPAMIDOL (ISOVUE-300) INJECTION 61% COMPARISON:  None. FINDINGS: Lower chest: Described separately. Small pleural effusions and atelectasis Hepatobiliary: Low-density along the falciform ligament is likely perfusion anomaly. Cannot exclude cirrhosis. The liver surface is lobulated. Cholelithiasis. No signs of acute cholecystitis. Pancreas: Cystic density of the pancreatic tail measuring 26 mm. No surrounding inflammatory changes. There is a coarse calcification and neighboring pancreatic tail which is somewhat full. Some this fullness may be related to venous collaterals. Spleen: Enlarged this 17 cm span. This is likely related to chronic splenic vein occlusion. There are well-formed venous collaterals, including proximal gastric varices. Adrenals/Urinary Tract: Negative adrenals. No hydronephrosis or stone. Urinary bladder predominately decompressed by Foley catheter. Stomach/Bowel: Numerous colonic diverticula. There is question of diverticulitis at the splenic flexure, but no noted gas in this region to explain pneumoperitoneum. No inflammatory changes noted around the stomach or proximal duodenum. No visible ulcer. Proximal gastric varices. Negative appendix. Vascular/Lymphatic: No acute vascular abnormality.  Splenic vein occlusion as described. Extensive atherosclerosis. No mass or adenopathy. Reproductive:No pathologic findings. Other: Moderate volume pneumoperitoneum,  maximal in the upper ventral abdomen, but also present in the left lower quadrant. Musculoskeletal:  Degenerative changes.  No acute abnormalities. Findings are known based on previous recorded communications. Mid level has been paged to make aware of the completed scan. IMPRESSION: 1. No definitive source for the patient's moderate pneumoperitoneum. Given there is no inflammatory changes or visible ulcer along the stomach or proximal duodenum, a diverticular source is favored. There may be mild diverticulitis along the splenic flexure, but no extraluminal gas in this region. 2. 26 mm fluid collection in the splenic fossa. Suspect pseudocyst as there is ductal or parenchymal calcification in the pancreatic tail. Pancreatic tail is full and pancreas protocol CT or MRI follow-up is recommended after convalescence to exclude mass. 3. 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 Patient was admitted with GI bleeding. 4. Cholelithiasis. 5. Suspected cirrhosis. 6. Splenomegaly. Electronically Signed: By: Monte Fantasia M.D. On: 10/14/2016 21:19    Medications: . baclofen  40 mg Oral QHS  . clonazePAM  1 mg Oral TID  . feeding supplement (ENSURE ENLIVE)  237 mL Oral BID BM  . folic acid  1 mg Oral Daily  . levETIRAcetam  500 mg Oral Daily  . mouth rinse  15 mL Mouth Rinse BID  . nicotine  14 mg Transdermal Daily  . pantoprazole  40 mg Oral QHS  . QUEtiapine  100 mg Oral BID  . sertraline  50 mg Oral Daily  . thiamine  100 mg Oral Daily   . sodium chloride 0.9 % 1,000 mL infusion 125 mL/hr at 10/16/16 [id number]    Assessment/Plan Pneumoperitoneum, etiology unknown Acute respiratory failure/hypoxia - possible aspiration pneumonitis   Severe anemia with GI bleed with transfusion - 5 units PRBC 1/22-1/23/18 Acute  delirium/anxiety/possible baclofen withdrawal/ETOH abuse Acute kidney injury - resolving SVT/hypokalemia FEN:  IV fluids/Dysphagia I ID: Zosyn 1/23-1/29; none since that time  DVT:  Adding SCD    Plan:  No reason found or observed for pneumoperitoneum.   Recheck labs in AM.     LOS: 15 days    Corrie Brannen 10/16/2016 [phone]

## 2016-10-16 NOTE — Progress Notes (Signed)
Speech Language Pathology Treatment: Dysphagia  Patient Details Name: Barry Mccoy MRN: [id number] DOB: 08/16/1951 Today's Date: 10/16/2016 Time: [id number] SLP Time Calculation (min) (ACUTE ONLY): 31 min  Assessment / Plan / Recommendation Clinical Impression  Pt demonstrates immediate and delayed coughing and throat clearing with thin liquids through session. He is alert, but speech is slurred and swallow subjectively feels delayed. He struggles to attend to PO due to constant talking. He has not been eating any of his meals due to texture. Daughter had success giving him some biscuit with gravy this am. Recommend pt downgrade to nectar thick liquids which were tolerated well but upgrade solids to dys 2 fine chop for improved intake with meals. Will f/u with MBS tomorrow.    HPI HPI: 66 year old male admitted 10/01/16 due to near syncope, AMS, acute encephalopathy and intermittent hypoxia. Pt intubated 1/24-28/18 due to respiratory distress. UGI 10/04/16 - hypertensive gastropathy vs inflammatory gastritis. CXR 10/07/16 - bilateral airspace disease. PMH unremarkable.      SLP Plan  MBS     Recommendations  Diet recommendations: Dysphagia 2 (fine chop);Nectar-thick liquid Liquids provided via: Cup;Straw Medication Administration: Whole meds with liquid Supervision: Patient able to self feed;Full supervision/cueing for compensatory strategies Compensations: Slow rate;Small sips/bites Postural Changes and/or Swallow Maneuvers: Seated upright 90 degrees                Oral Care Recommendations: Oral care BID Follow up Recommendations: None Plan: MBS       GO                Barry Mccoy, Barry Mccoy 10/16/2016, 2:53 PM

## 2016-10-16 NOTE — Progress Notes (Signed)
Paged morning MD since patient still having increased confusion still over night and several black stools.  If D-dimer 3.61 wanted recheck with morning labs prior to be drawn.  Patient seems to have increased agitation and hallucination after taking klonopin dose last night.  MD response no new orders at this time.

## 2016-10-17 ENCOUNTER — Inpatient Hospital Stay (HOSPITAL_COMMUNITY): Payer: Medicare Other

## 2016-10-17 LAB — CBC
HCT: 32.4 % — ABNORMAL LOW (ref 39.0–52.0)
Hemoglobin: 9.8 g/dL — ABNORMAL LOW (ref 13.0–17.0)
MCH: 24.6 pg — AB (ref 26.0–34.0)
MCHC: 30.2 g/dL (ref 30.0–36.0)
MCV: 81.4 fL (ref 78.0–100.0)
PLATELETS: 203 10*3/uL (ref 150–400)
RBC: 3.98 MIL/uL — ABNORMAL LOW (ref 4.22–5.81)
RDW: 18.9 % — AB (ref 11.5–15.5)
WBC: 5.6 10*3/uL (ref 4.0–10.5)

## 2016-10-17 LAB — BASIC METABOLIC PANEL
Anion gap: 12 (ref 5–15)
BUN: 7 mg/dL (ref 6–20)
CO2: 24 mmol/L (ref 22–32)
CREATININE: 0.95 mg/dL (ref 0.61–1.24)
Calcium: 9.2 mg/dL (ref 8.9–10.3)
Chloride: 104 mmol/L (ref 101–111)
GFR calc Af Amer: >60 mL/min (ref >60)
GFR calc non Af Amer: >60 mL/min (ref >60)
GLUCOSE: 95 mg/dL (ref 65–99)
Potassium: 3.6 mmol/L (ref 3.5–5.1)
Sodium: 140 mmol/L (ref 135–145)

## 2016-10-17 LAB — GLUCOSE, CAPILLARY
GLUCOSE-CAPILLARY: 96 mg/dL (ref 65–99)
Glucose-Capillary: 93 mg/dL (ref 65–99)
Glucose-Capillary: 117 mg/dL — ABNORMAL HIGH (ref 65–99)

## 2016-10-17 MED ORDER — CLONAZEPAM 0.5 MG PO TABS
0.5000 mg | ORAL_TABLET | Freq: Every day | ORAL | Status: DC
  Filled 2016-10-17: qty 1

## 2016-10-17 MED ORDER — LORAZEPAM 2 MG/ML IJ SOLN
0.5000 mg | Freq: Four times a day (QID) | INTRAMUSCULAR | Status: DC | PRN
  Administered 2016-10-17 – 2016-10-18 (×2): 0.5 mg via INTRAVENOUS
  Filled 2016-10-17 (×2): qty 1

## 2016-10-17 MED ORDER — HYDROMORPHONE HCL 2 MG/ML IJ SOLN: 1.0000 mg | INTRAMUSCULAR | Status: DC | PRN

## 2016-10-17 MED ORDER — HALOPERIDOL LACTATE 5 MG/ML IJ SOLN
2.0000 mg | Freq: Once | INTRAMUSCULAR | Status: DC
  Filled 2016-10-17: qty 1

## 2016-10-17 MED ORDER — BACLOFEN 20 MG PO TABS
20.0000 mg | ORAL_TABLET | Freq: Every day | ORAL | Status: DC
  Administered 2016-10-17 – 2016-10-22 (×6): 20 mg via ORAL
  Filled 2016-10-17 (×6): qty 1

## 2016-10-17 MED ORDER — QUETIAPINE FUMARATE 100 MG PO TABS: 100.0000 mg | ORAL_TABLET | Freq: Every day | ORAL | Status: DC

## 2016-10-17 MED ORDER — CLONAZEPAM 0.5 MG PO TABS
0.5000 mg | ORAL_TABLET | Freq: Two times a day (BID) | ORAL | Status: DC
  Administered 2016-10-17: 0.5 mg via ORAL
  Filled 2016-10-17: qty 1

## 2016-10-17 MED ORDER — HALOPERIDOL LACTATE 5 MG/ML IJ SOLN
2.5000 mg | Freq: Once | INTRAMUSCULAR | Status: AC
Start: 2016-10-17 — End: 2016-10-17
  Administered 2016-10-17: 2.5 mg via INTRAVENOUS
  Filled 2016-10-17: qty 1

## 2016-10-17 MED ORDER — QUETIAPINE FUMARATE 50 MG PO TABS: 150.0000 mg | ORAL_TABLET | Freq: Every day | ORAL | Status: DC

## 2016-10-17 MED ORDER — HALOPERIDOL LACTATE 5 MG/ML IJ SOLN
2.5000 mg | Freq: Four times a day (QID) | INTRAMUSCULAR | Status: DC | PRN
  Administered 2016-10-17 – 2016-10-18 (×2): 2.5 mg via INTRAVENOUS
  Filled 2016-10-17 (×2): qty 1

## 2016-10-17 NOTE — Progress Notes (Signed)
Blood sugar at 0400 not taken to not disturb patient as he had recently falling back asleep after a moment of confusion and agitation.

## 2016-10-17 NOTE — Progress Notes (Signed)
Patient has become extremely confused and combative. He pulled out both IV sites, was kicking at bed trying to break rails as well as smacking and kicking at staff. Ativan and haldol were both given before IV sites were pulled out with no relief. K. Schorr paged and 4 point restraints were applied. 1:1 sitter at bedside. Will continue to monitor.  Jimmie Molly, RN

## 2016-10-17 NOTE — Progress Notes (Signed)
Nutrition Follow-up   DOCUMENTATION CODES:   Obesity unspecified  INTERVENTION:    Ensure Enlive PO BID, each supplement provides 350 kcal and 20 grams of protein  NUTRITION DIAGNOSIS:   Inadequate oral intake related to dysphagia as evidenced by meal completion < 25%, ongoing  GOAL:   Patient will meet greater than or equal to 90% of their needs, progressing   MONITOR:   PO intake, Supplement acceptance, Labs, I & O's  ASSESSMENT:   66 year old man who presented to the hospital on 1/22 with near syncope, dyspnea on exertion found to have symptomatic anemia. Source seems to be GI w/ findings of melena. He developed acute hypoxic respiratory failure requiring transfer to the ICU and intubation 1/23.  Pt extubated 1/28. Psychiatry assessment completed for acute delirium. Surgery following >> no acute surgical need at this time. PO intake variable at 25-50% per flowsheet records. Receiving Ensure Enlive supplement BID. S/p MBSS today.  Diet Order:  DIET DYS 2 Room service appropriate? Yes; Fluid consistency: Nectar Thick  Skin:  Reviewed, no issues  Last BM:  1/31  Height:   Ht Readings from Last 1 Encounters:  10/02/16 6\' 2"  (1.88 m)    Weight:   Wt Readings from Last 1 Encounters:  10/17/16 214 lb 1.1 oz (97.1 kg)   Ideal Body Weight:  86.4 kg  BMI:  Body mass index is 27.48 kg/m.  Estimated Nutritional Needs:   Kcal:  2100-2300  Protein:  110-130 gm  Fluid:  2.1-2.3 L  EDUCATION NEEDS:   No education needs identified at this time  Arthur Holms, RD, LDN Pager #: [phone] After-Hours Pager #: [phone]

## 2016-10-17 NOTE — Progress Notes (Signed)
Modified Barium Swallow Progress Note  Patient Details  Name: Barry Mccoy MRN: [id number] Date of Birth: 1951/09/03  Today's Date: 10/17/2016  Modified Barium Swallow completed.  Full report located under Chart Review in the Imaging Section.  Brief recommendations include the following:  Clinical Impression  Pt demonstrates a mild oropharyngeal dysphagia, likely secondary to sluggish motor control and lethargy associated with this acute illness. Pt difficult to arouse for MBS, continues to be dysarthric and impulsive with self feeding. Primary problem is impulsive, overlarge intake of cup and straw sips of thin liquids leading to premature spillage into the vestibule and delayed swallow initiation with penetration before the swallow. Penetration events are silent, but clear with a cued throat clear. Pts dentures were not present for this test, but mastication of solids was low, requiring verbal cues. Recommend pt continue current diet recommendation of nectar thick liquids and dys 2(fine chopped solids) until mentation improves and arousal is consistent. Pt should not remain on nectar thick liquids long term as dysphagia is relatively mild. Will discuss with pts daughter.    Swallow Evaluation Recommendations       SLP Diet Recommendations: Dysphagia 2 (Fine chop) solids;Nectar thick liquid   Liquid Administration via: Cup;Straw   Medication Administration: Whole meds with puree   Supervision: Staff to assist with self feeding;Full supervision/cueing for compensatory strategies   Compensations: Slow rate;Small sips/bites   Postural Changes: Seated upright at 90 degrees   Oral Care Recommendations: Oral care BID   Other Recommendations: Order thickener from pharmacy    Derk Doubek, Katherene Ponto 10/17/2016,2:06 PM

## 2016-10-17 NOTE — Consult Note (Signed)
[facility] Consult   Reason for Consult:  Delusion, verbally abusive, hallucinations Referring Physician:  Dr. Grandville Silos Patient Identification: Barry Mccoy MRN:  [id number] Principal Diagnosis: Acute GI bleeding Diagnosis:   Patient Active Problem List   Diagnosis Date Noted  . Acute encephalopathy [G93.40]   . Positive D dimer [R79.89]   . Severe single current episode of major depressive disorder, with psychotic features ([place]) [F32.3]   . Delirium [R41.0]   . Acute respiratory failure ([place]) [J96.00]   . Symptomatic anemia [D64.9] 10/01/2016  . Acute GI bleeding [K92.2] 10/01/2016    Total Time spent with patient: 45 minutes  Subjective:   Barry Mccoy is a 66 y.o. male patient admitted with acute GI bleeding  HPI: Patient is confused, disoriented, combative and unable to give a coherent history. Chart review indicates that patient has history of major depression with psychosis. He was admitted to the medical unit on 10/01/16 after nearly passing out while out shopping. He was hypotensive and had to be transfused 5 units of PRBC. He later developed agitation and delirium. Further history could not be obtained from the patient due to being incoherent and confused.  Past Psychiatric History: MDD with psychosis  10/17/2016  Interval history: Patient seen, and case discussed with the staff RN for psychiatric consultation follow-up. Patient continued to be confused, hallucinating, reporting some things crawling on the floor when there is nothing seen by this provider. Patient is also defiant and refusing to follow instructions. Patient has been monitored by tele sitter.  Patient continued to be poor historian and could not understand why he has been placed in the hospital and why he has been confused. Patient stated he is no longer drinking or smoking tobacco and has been using nicotine patch and also reported preferred Chantix. Patient stated that his sister has been his  caretaker but this is not available in the hospital. Patient continued to be irritability, agitation and incoherent. Patient has limited insight into his both emotional and physical health. May contact psychiatric consultation if needed and also agreed Haldol when necessary for agitation.  Risk to Self: Is patient at risk for suicide?: No Risk to Others:   Prior Inpatient Therapy:   Prior Outpatient Therapy:    Past Medical History:  Past Medical History:  Diagnosis Date  . Muscle spasm     Past Surgical History:  Procedure Laterality Date  . ELBOW SURGERY    . ESOPHAGOGASTRODUODENOSCOPY N/A 10/04/2016   Procedure: ESOPHAGOGASTRODUODENOSCOPY (EGD);  Surgeon: Carol Ada, MD;  Location: [facility];  Service: Gastroenterology;  Laterality: N/A;   Family History:  Family History  Problem Relation Age of Onset  . Colon cancer Brother    Family Psychiatric  History:  Social History:  History  Alcohol Use No     History  Drug Use No    Social History   Social History  . Marital status: Widowed    Spouse name: N/A  . Number of children: N/A  . Years of education: N/A   Social History Main Topics  . Smoking status: Current Every Day Smoker    Packs/day: 1.00    Types: Cigarettes  . Smokeless tobacco: Never Used  . Alcohol use No  . Drug use: No  . Sexual activity: Not Asked   Other Topics Concern  . None   Social History Narrative  . None   Additional Social History:    Allergies:   Allergies  Allergen Reactions  . Shellfish Allergy Other (See  Comments)    Unknown     Labs:  Results for orders placed or performed during the hospital encounter of 10/01/16 (from the past 48 hour(s))  Glucose, capillary     Status: None   Collection Time: 10/15/16  8:49 PM  Result Value Ref Range   Glucose-Capillary 89 65 - 99 mg/dL   Comment 1 Notify RN   Glucose, capillary     Status: None   Collection Time: 10/16/16  8:05 AM  Result Value Ref Range    Glucose-Capillary 76 65 - 99 mg/dL  Glucose, capillary     Status: None   Collection Time: 10/16/16 12:23 PM  Result Value Ref Range   Glucose-Capillary 84 65 - 99 mg/dL  Basic metabolic panel     Status: Abnormal   Collection Time: 10/16/16 12:55 PM  Result Value Ref Range   Sodium 140 135 - 145 mmol/L   Potassium 3.6 3.5 - 5.1 mmol/L   Chloride 107 101 - 111 mmol/L   CO2 22 22 - 32 mmol/L   Glucose, Bld 103 (H) 65 - 99 mg/dL   BUN 8 6 - 20 mg/dL   Creatinine, Ser 0.95 0.61 - 1.24 mg/dL   Calcium 9.2 8.9 - 10.3 mg/dL   GFR calc non Af Amer >60 >60 mL/min   GFR calc Af Amer >60 >60 mL/min    Comment: (NOTE) The eGFR has been calculated using the CKD EPI equation. This calculation has not been validated in all clinical situations. eGFR's persistently <60 mL/min signify possible Chronic Kidney Disease.    Anion gap 11 5 - 15  CBC     Status: Abnormal   Collection Time: 10/16/16 12:55 PM  Result Value Ref Range   WBC 7.3 4.0 - 10.5 K/uL   RBC 4.30 4.22 - 5.81 MIL/uL   Hemoglobin 10.7 (L) 13.0 - 17.0 g/dL   HCT 35.1 (L) 39.0 - 52.0 %   MCV 81.6 78.0 - 100.0 fL   MCH 24.9 (L) 26.0 - 34.0 pg   MCHC 30.5 30.0 - 36.0 g/dL   RDW 19.1 (H) 11.5 - 15.5 %   Platelets 222 150 - 400 K/uL  Magnesium     Status: None   Collection Time: 10/16/16 12:55 PM  Result Value Ref Range   Magnesium 1.9 1.7 - 2.4 mg/dL  Glucose, capillary     Status: None   Collection Time: 10/16/16  5:13 PM  Result Value Ref Range   Glucose-Capillary 96 65 - 99 mg/dL  Glucose, capillary     Status: None   Collection Time: 10/16/16  7:42 PM  Result Value Ref Range   Glucose-Capillary 97 65 - 99 mg/dL   Comment 1 Notify RN   Glucose, capillary     Status: None   Collection Time: 10/16/16 11:08 PM  Result Value Ref Range   Glucose-Capillary 97 65 - 99 mg/dL   Comment 1 Notify RN   Basic metabolic panel     Status: None   Collection Time: 10/17/16  7:06 AM  Result Value Ref Range   Sodium 140 135 - 145  mmol/L   Potassium 3.6 3.5 - 5.1 mmol/L   Chloride 104 101 - 111 mmol/L   CO2 24 22 - 32 mmol/L   Glucose, Bld 95 65 - 99 mg/dL   BUN 7 6 - 20 mg/dL   Creatinine, Ser 0.95 0.61 - 1.24 mg/dL   Calcium 9.2 8.9 - 10.3 mg/dL   GFR calc non Af Amer >  60 >60 mL/min   GFR calc Af Amer >60 >60 mL/min    Comment: (NOTE) The eGFR has been calculated using the CKD EPI equation. This calculation has not been validated in all clinical situations. eGFR's persistently <60 mL/min signify possible Chronic Kidney Disease.    Anion gap 12 5 - 15  CBC     Status: Abnormal   Collection Time: 10/17/16  7:06 AM  Result Value Ref Range   WBC 5.6 4.0 - 10.5 K/uL   RBC 3.98 (L) 4.22 - 5.81 MIL/uL   Hemoglobin 9.8 (L) 13.0 - 17.0 g/dL   HCT 32.4 (L) 39.0 - 52.0 %   MCV 81.4 78.0 - 100.0 fL   MCH 24.6 (L) 26.0 - 34.0 pg   MCHC 30.2 30.0 - 36.0 g/dL   RDW 18.9 (H) 11.5 - 15.5 %   Platelets 203 150 - 400 K/uL  Glucose, capillary     Status: None   Collection Time: 10/17/16  7:38 AM  Result Value Ref Range   Glucose-Capillary 96 65 - 99 mg/dL  Glucose, capillary     Status: None   Collection Time: 10/17/16 12:23 PM  Result Value Ref Range   Glucose-Capillary 93 65 - 99 mg/dL  Glucose, capillary     Status: Abnormal   Collection Time: 10/17/16  3:35 PM  Result Value Ref Range   Glucose-Capillary 117 (H) 65 - 99 mg/dL    Current Facility-Administered Medications  Medication Dose Route Frequency Provider Last Rate Last Dose  . baclofen (LIORESAL) tablet 20 mg  20 mg Oral QHS Domenic Polite, MD      . clonazePAM Bobbye [place]) tablet 0.5 mg  0.5 mg Oral BID Ambrose Finland, MD   0.5 mg at 10/17/16 0957  . docusate (COLACE) 50 MG/5ML liquid 100 mg  100 mg Oral BID PRN Milagros Loll, MD      . feeding supplement (ENSURE ENLIVE) (ENSURE ENLIVE) liquid 237 mL  237 mL Oral BID BM Jose Angelo Radford Pax, MD   237 mL at 10/17/16 1400  . folic acid (FOLVITE) tablet 1 mg  1 mg Oral Daily Praveen Mannam, MD    1 mg at 10/17/16 0957  . levETIRAcetam (KEPPRA XR) 24 hr tablet 500 mg  500 mg Oral Daily Flora Lipps, MD   500 mg at 10/17/16 0957  . LORazepam (ATIVAN) injection 0.5 mg  0.5 mg Intravenous Q6H PRN Domenic Polite, MD      . MEDLINE mouth rinse  15 mL Mouth Rinse BID Praveen Mannam, MD   15 mL at 10/17/16 0957  . nicotine (NICODERM CQ - dosed in mg/24 hours) patch 14 mg  14 mg Transdermal Daily Praveen Mannam, MD   14 mg at 10/17/16 0956  . pantoprazole (PROTONIX) EC tablet 40 mg  40 mg Oral QHS Jose Shirl [place], MD   40 mg at 10/16/16 2021  . [START ON 10/18/2016] QUEtiapine (SEROQUEL) tablet 150 mg  150 mg Oral QHS Ambrose Finland, MD      . RESOURCE THICKENUP CLEAR   Oral PRN Robbie Lis, MD      . sertraline (ZOLOFT) tablet 50 mg  50 mg Oral Daily Eagle, MD   50 mg at 10/17/16 1300  . thiamine (VITAMIN B-1) tablet 100 mg  100 mg Oral Daily Bluewell, MD   100 mg at 10/17/16 0957  . traMADol (ULTRAM) tablet 50 mg  50 mg Oral Q6H PRN  Gardiner Barefoot, NP   50 mg at 10/16/16 2020    Musculoskeletal: Strength & Muscle Tone: not tested Gait & Station: unable to stand Patient leans: N/A  Psychiatric Specialty Exam: Physical Exam  Psychiatric: Thought content normal. His mood appears anxious. His speech is tangential and slurred. He is combative. He expresses impulsivity.    Review of Systems  Unable to perform ROS: Mental status change    Blood pressure 129/69, pulse 78, temperature 98.4 F (36.9 C), temperature source Oral, resp. rate (!) 22, height 6' 2" (1.88 m), weight 97.1 kg (214 lb 1.1 oz), SpO2 99 %.Body mass index is 27.48 kg/m.  General Appearance: Casual  Eye Contact:  Minimal  Speech:  Pressured and Slurred  Volume:  Increased  Mood:  Anxious  Affect:  Labile  Thought Process:  Disorganized  Orientation:  Other:  unable to assess  Thought Content:  Illogical  Suicidal Thoughts:  unable to assess  Homicidal Thoughts:  No   Memory:  unable to assess  Judgement:  Other:  unable to assess  Insight:  unable to assess  Psychomotor Activity:  Increased and Restlessness  Concentration:  Concentration: Poor and Attention Span: Poor  Recall:  Poor  Fund of Knowledge:  Fair  Language:  Fair  Akathisia:  No  Handed:  Right  AIMS (if indicated):     Assets:  Social Support  ADL's:  Impaired  Cognition: unable to assess   Sleep:        Treatment Plan Summary: Daily contact with patient to assess and evaluate symptoms and progress in treatment   Diagnosis: 1. Acute Delirium - slowly improving 2. History of MDD  Recommendations; Neurology referral for delirium and acute confusional state Change Seroquel to 150 mg at bedtime for hallucinations Lorazepam 0.5 mg every 6 hours as needed for agitation in elderly Clonazepam 0.5 mg at bedtime only  Continue Zoloft 50 mg daily for depression. Agreed with PRN haldol for agitation.   Disposition: Patient does not meet criteria for psychiatric inpatient admission. Supportive therapy provided about ongoing stressors.  Re-consult psych unit when necessary.  Ambrose Finland, MD 10/17/2016 5:05 PM

## 2016-10-17 NOTE — Final Consult Note (Cosign Needed Addendum)
Consultant Final Sign-Off Note    Assessment/Final recommendations  Barry Mccoy is a 66 y.o. male followed by me for Pneumoperitoneum, etiology unknown.   He has no surgical need at this time.  I would advance diet as tolerated.    Additional issues:   Severe anemia with GI bleed with transfusion - 5 units PRBC 1/22-1/23/18 Acute delirium/anxiety/possible baclofen withdrawal/ETOH abuse - improving Acute kidney injury - resolving SVT/hypokalemia - K+ 3.6 FEN:  IV fluids/Dysphagia II ID: Zosyn 1/23-1/29; none since that time  DVT:  SCD     Wound care (if applicable):  None   Diet at discharge: advance as tolerated   Activity at discharge: per primary team   Follow-up appointment:   None   Pending results:  Unresulted Labs    Start     Ordered   10/17/16 [id number]  Basic metabolic panel  Once-Timed,   R    Question:  Specimen collection method  Answer:  Lab=Lab collect   10/17/16 0335   10/17/16 0600  CBC  Once-Timed,   R    Question:  Specimen collection method  Answer:  Lab=Lab collect   10/17/16 0335       Medication recommendations:   Other recommendations:    Thank you for allowing [place] to participate in the care of your patient!  Please consult [place] again if you have further needs for your patient.  Barry Mccoy 10/17/2016 7:18 AM    Subjective  Awake telling jokes, Has a Telesitter in the PM some confusion and hallucination per PM nurse, but he is doing better.  No abdominal complaints or discomfort, + BM yesterday black and tarry per nurse.  Tolerating diet.   Objective  Vital signs in last 24 hours: Temp:  [97.6 F (36.4 C)-98.6 F (37 C)] 97.6 F (36.4 C) (02/07 0600) Pulse Rate:  [80-94] 87 (02/07 0600) Resp:  [0-23] 15 (02/07 0300) BP: (90-145)/(54-110) 90/72 (02/07 0600) SpO2:  [93 %-98 %] 98 % (02/07 0600) Weight:  [97.1 kg (214 lb 1.1 oz)] 97.1 kg (214 lb 1.1 oz) (02/07 0319) 600 PO recorded 250 IV recorded 1250 urine BM x 1 Afebrile,  VSS Labs yesterday were normal No additional Radiology studies General: WNWD male, no distress Abd:  Soft, nonteder + BS, no distension Confused some but more, but making progress.        Pertinent labs and Studies:  Recent Labs  10/15/16 0213 10/16/16 1255  WBC 6.5 7.3  HGB 10.1* 10.7*  HCT 33.5* 35.1*   BMET  Recent Labs  10/15/16 0213 10/16/16 1255  NA 140 140  K 3.3* 3.6  CL 108 107  CO2 22 22  GLUCOSE 111* 103*  BUN 7 8  CREATININE 0.89 0.95  CALCIUM 8.8* 9.2   No results for input(s): LABURIN in the last 72 hours. Results for orders placed or performed during the hospital encounter of 10/01/16  Culture, Urine     Status: Abnormal   Collection Time: 10/02/16  1:00 AM  Result Value Ref Range Status   Specimen Description URINE, CLEAN CATCH  Final   Special Requests NONE  Final   Culture MULTIPLE SPECIES PRESENT, SUGGEST RECOLLECTION (A)  Final   Report Status 10/03/2016 FINAL  Final  MRSA PCR Screening     Status: None   Collection Time: 10/02/16  3:32 AM  Result Value Ref Range Status   MRSA by PCR NEGATIVE NEGATIVE Final    Comment:        The GeneXpert MRSA  Assay (FDA approved for NASAL specimens only), is one component of a comprehensive MRSA colonization surveillance program. It is not intended to diagnose MRSA infection nor to guide or monitor treatment for MRSA infections.   MRSA PCR Screening     Status: None   Collection Time: 10/02/16  4:34 PM  Result Value Ref Range Status   MRSA by PCR NEGATIVE NEGATIVE Final    Comment:        The GeneXpert MRSA Assay (FDA approved for NASAL specimens only), is one component of a comprehensive MRSA colonization surveillance program. It is not intended to diagnose MRSA infection nor to guide or monitor treatment for MRSA infections.     Imaging:  CT scan 10/14/16: 1. No definitive source for the patient's moderate pneumoperitoneum. Given there is no inflammatory changes or visible ulcer  along the stomach or proximal duodenum, a diverticular source is favored. There may be mild diverticulitis along the splenic flexure, but no extraluminal gas in this region. 2. 26 mm fluid collection in the splenic fossa. Suspect pseudocyst as there is ductal or parenchymal calcification in the pancreatic tail. Pancreatic tail is full and pancreas protocol CT or MRI follow-up is recommended after convalescence to exclude mass. 3. 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 Patient was admitted with GI bleeding. 4. Cholelithiasis. 5. Suspected cirrhosis. 6. Splenomegaly.

## 2016-10-17 NOTE — Progress Notes (Signed)
Dr. Broadus John informed patient very agitated. Security called three times. Restraint and medication orders received. Will continue to monitor patient.

## 2016-10-17 NOTE — Progress Notes (Signed)
Patient ID: Barry Mccoy, male   DOB: [birth year], 66 y.o.   MRN: [id number]  PROGRESS NOTE    Barry Mccoy  [id number] DOB: 28-Jul-1951 DOA: 10/01/2016  PCP: No PCP Per Patient Brief Narrative:  66 year old man with past medical history of migraine headaches (takes keppra), depression who presented to [facility] 10/01/16 after nearly passing out while out shopping. Pt also reported exertional dyspnea, black stools. He was hypotensive in ED and it was thought that this is due to possible GI bleed. He was transfused total of 5 units PRBC during this admission. He developed acute delirium and agitation requiring total of 12 mg ativan which resulted in somnolence and acute respiratory failure with hypoxia and apnea. He was intubated from 1/23 - 1/28. He also required brief precedex infusion. He also had afib/SVT/sinus tachycardia >>placed on amio IV and then converted to sinus tachycardia. -agitation/delirium continues to be an issue, seen by Psych,  removed ankle restraints 2/6. Also had a dark stool 2/6  Assessment & Plan:   Acute respiratory failure with hypoxia and inability to protect airways / Aspiration pneumonitis - Somnolence due to multiple ativan doses for agitation, delirium - Intubated form 1/23 through 1/28 - Has received total of 7 days of zosyn and needs no further antibiotics at this time.  - wean off O2, ambulate, OOB  Delirium secondary to depression / anxiety / alcohol abuse and possible baclofen withdrawal - Precedex drip weaned off 2/1 - Pt was on seroquel at 200 mg BID but concern that this med may have cause SVT. Seroquel dose cut to 100 mg BID on 2/1 per PCCM.  - Also on Zoloft 50 mg daily  - Patient noted overnight 2/3 to 2/4 to have hallucinations, delusions, combative, verbally abusive and striking out at staff members.4 point restraints placed, now off -seen by Psych, recommended Haldol PRN -will cut down Clonazepam and use ativan PRN instead  Pneumoperitoneum - Noted on CT  scan 2/4 - Surgery has seen in consultation; benign abd exam, no evidence of evolving sepsis so surgery not required at this time. Per Dr.Blackman, uncertain of the cause of the free air or how long he has had it. He may have had an intra-abdominal event that is now walled itself off.   Anemia of chronic disease / Acute upper GI bleed - Likely related to portal gastropathy.  - S/P EGD 10/04/2016--with portal gastropathy versus inflammatory gastritis. - S/P 5 units PRBC transfusion  - had tarry stool in past 24 hours but not this am and his hgb is stable   Hypokalemia - Due to alcohol abuse - Supplemented  SVT - Lkely related to agitation. CTA negative for PE - Started on amiodarone drip on 2/1 but he is now in sinus rhythm  - D/C'ed amio 2/1  AKI - Resolved with fluids - Cr WNL  Elevated d-dimer - Patient noted to have elevated d-dimer of 3.61 on 10/14/2016.  - CT angio chest negative for PE  DVT prophylaxis: SCD's bilaterally  Code Status: Full Family Communication: Family not at the bedside this am; spoke with pt daughter over the phone  On 2/6 [phone]) and left message on her phone this am and gave update  Disposition Plan: Transfer to med surg, will need SNF   Consultants:   Gastroenterology: Dr. Benson Norway 10/02/2016  PCCM: Dr Nelda Marseille 10/02/2016  Psychiatry 2/4  Surgery 10/14/2016   Procedures:   EGD 1/25>> Portal hypertensive gastropathy versus inflammatory gastritis.  EEG 1/25>> severe diffuse generalized slowing, no seizures  ETT 1/23>>1/28  RIJ CVC 1/25>>  1 unit packed red blood cells 10/01/2016  4 units packed red blood cells 10/02/2016  SIGNIFICANT EVENTS: 1/22 admit, transfused 3 u pRBC 1/23 intubated, transfused 2 u pRBC 1/25 underwent EEG and EGD 1/28 Extubated  Antimicrobials:   IV Zosyn 10/02/2016>>>> 10/09/2016    Subjective: Mentally improving slowly  Objective: Vitals:   10/17/16 0500 10/17/16 0600 10/17/16 0700 10/17/16 1224   BP: 130/71 90/72 (!) 117/56 125/72  Pulse: 84 87  86  Resp:    13  Temp:  97.6 F (36.4 C) 98 F (36.7 C) 98 F (36.7 C)  TempSrc:  Oral Axillary Oral  SpO2: 95% 98%  97%  Weight:      Height:        Intake/Output Summary (Last 24 hours) at 10/17/16 1309 Last data filed at 10/17/16 0600  Gross per 24 hour  Intake              600 ml  Output             1250 ml  Net             -650 ml   Filed Weights   10/15/16 0410 10/16/16 0313 10/17/16 0319  Weight: 96.6 kg (212 lb 15.4 oz) 97.2 kg (214 lb 4.6 oz) 97.1 kg (214 lb 1.1 oz)    Examination:  General exam: alert, awake, in waist restraints, oriented to self and partly to place, not to time Respiratory system: No wheezing, no rhonchi  Cardiovascular system: S1 & S2 heard, Rate controlled  Gastrointestinal system: soft, NT, BS present  Central nervous system: moves all extremities Extremities: No swelling, palpable pulses  Skin: Skin is warm and dry  Psychiatry: normal behavior  Data Reviewed: I have personally reviewed following labs and imaging studies  CBC:  Recent Labs Lab 10/13/16 0213 10/14/16 0105 10/15/16 0213 10/16/16 1255 10/17/16 0706  WBC 7.5 6.2 6.5 7.3 5.6  HGB 9.2* 10.4* 10.1* 10.7* 9.8*  HCT 30.2* 34.1* 33.5* 35.1* 32.4*  MCV 83.0 81.8 80.9 81.6 81.4  PLT 172 178 191 222 [id number]   Basic Metabolic Panel:  Recent Labs Lab 10/11/16 0312 10/12/16 0339 10/13/16 0213 10/14/16 0105 10/15/16 0213 10/16/16 1255 10/17/16 0706  NA 142 141 140 139 140 140 140  K 3.0* 3.2* 4.1 3.6 3.3* 3.6 3.6  CL 112* 108 108 105 108 107 104  CO2 24 22 24 24 22 22 24   GLUCOSE 129* 117* 110* 116* 111* 103* 95  BUN 14 9 8 7 7 8 7   CREATININE 0.82 0.87 0.88 0.83 0.89 0.95 0.95  CALCIUM 8.3* 8.6* 8.7* 9.1 8.8* 9.2 9.2  MG 1.9 2.0 2.2 2.1 2.0 1.9  --   PHOS 2.0* 3.6 3.7 2.5 3.2  --   --    GFR: Estimated Creatinine Clearance: 88.9 mL/min (by C-G formula based on SCr of 0.95 mg/dL). Liver Function Tests: No  results for input(s): AST, ALT, ALKPHOS, BILITOT, PROT, ALBUMIN in the last 168 hours. No results for input(s): LIPASE, AMYLASE in the last 168 hours.  Recent Labs Lab 10/14/16 0639  AMMONIA 14   Coagulation Profile: No results for input(s): INR, PROTIME in the last 168 hours. Cardiac Enzymes:  Recent Labs Lab 10/14/16 0108 10/14/16 0635 10/14/16 1220  TROPONINI <0.03 <0.03 <0.03   BNP (last 3 results) No results for input(s): PROBNP in the last 8760 hours. HbA1C: No results for input(s): HGBA1C in the last 72 hours. CBG:  Recent Labs Lab 10/16/16 1713 10/16/16 1942 10/16/16 2308 10/17/16 0738 10/17/16 1223  GLUCAP 96 97 97 96 93   Lipid Profile: No results for input(s): CHOL, HDL, LDLCALC, TRIG, CHOLHDL, LDLDIRECT in the last 72 hours. Thyroid Function Tests: No results for input(s): TSH, T4TOTAL, FREET4, T3FREE, THYROIDAB in the last 72 hours. Anemia Panel: No results for input(s): VITAMINB12, FOLATE, FERRITIN, TIBC, IRON, RETICCTPCT in the last 72 hours. Urine analysis:    Component Value Date/Time   COLORURINE YELLOW 10/14/2016 0505   APPEARANCEUR CLEAR 10/14/2016 0505   LABSPEC 1.012 10/14/2016 0505   PHURINE 7.0 10/14/2016 0505   GLUCOSEU NEGATIVE 10/14/2016 0505   HGBUR NEGATIVE 10/14/2016 0505   BILIRUBINUR NEGATIVE 10/14/2016 0505   KETONESUR NEGATIVE 10/14/2016 0505   PROTEINUR NEGATIVE 10/14/2016 0505   NITRITE NEGATIVE 10/14/2016 0505   LEUKOCYTESUR NEGATIVE 10/14/2016 0505   Sepsis Labs: @LABRCNTIP (procalcitonin:4,lacticidven:4)   )No results found for this or any previous visit (from the past 240 hour(s)).    Radiology Studies: Ct Head Wo Contrast Result Date: 10/14/2016 No acute finding or change from prior.   Ct Angio Chest Pe W Or Wo Contrast Result Date: 10/14/2016 1. Free intraperitoneal air within the upper abdomen. In the absence of a recent surgery, this suggests viscus perforation. CT abdomen and pelvis is recommended for further  characterization. 2. No pulmonary embolism seen, with study limitations detailed above. 3. Small right pleural effusion. Mild bibasilar atelectasis. Mild atelectasis versus edema within the upper lobes bilaterally. No evidence of pneumonia. 4. Free fluid in the left upper abdomen. Mass versus fluid collection adjacent to the pancreatic tail, incompletely imaged. Cholelithiasis, incompletely imaged. 5. Aortic atherosclerosis.   Ct Abdomen Pelvis W Contrast Result Date: 10/14/2016 1. No definitive source for the patient's moderate pneumoperitoneum. Given there is no inflammatory changes or visible ulcer along the stomach or proximal duodenum, a diverticular source is favored. There may be mild diverticulitis along the splenic flexure, but no extraluminal gas in this region. 2. 26 mm fluid collection in the splenic fossa. Suspect pseudocyst as there is ductal or parenchymal calcification in the pancreatic tail. Pancreatic tail is full and pancreas protocol CT or MRI follow-up is recommended after convalescence to exclude mass. 3. 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 Patient was admitted with GI bleeding. 4. Cholelithiasis. 5. Suspected cirrhosis. 6. Splenomegaly.    Scheduled Meds: . baclofen  40 mg Oral QHS  . clonazePAM  0.5 mg Oral BID  . feeding supplement (ENSURE ENLIVE)  237 mL Oral BID BM  . folic acid  1 mg Oral Daily  . levETIRAcetam  500 mg Oral Daily  . mouth rinse  15 mL Mouth Rinse BID  . nicotine  14 mg Transdermal Daily  . pantoprazole  40 mg Oral QHS  . QUEtiapine  100 mg Oral BID  . sertraline  50 mg Oral Daily  . thiamine  100 mg Oral Daily   Continuous Infusions:    LOS: 16 days    Time spent: 35 minutes  Greater than 50% of the time spent on counseling and coordinating the care.   Domenic Polite, MD [facility] Pager [phone] If 7PM-7AM, please contact night-coverage [email or web address] Password [email or web address] 10/17/2016, 1:09 PM

## 2016-10-17 NOTE — Progress Notes (Addendum)
Patient had a good night once given tramadol for pain.  Patient did recognize that he was hallucinating at one point when pointing out something in his room.  Patient rested from 2/6 2200 to 2/7 0230 uninterrupted.  Upon awakening patient was agitated and restless trying to climb out of be but was able to be reorient faster than previous nights.  When talking to the patient to reorient him he stated "I feel funny," explaining how he felt.  Patient was able to fall back asleep within an hour after back rub and heat packs applied to lumbar region.  Patient did state a desire to walk maybe PT department can walk him tomorrow now that he has been acting calmer and more cooperative.

## 2016-10-18 DIAGNOSIS — R4182 Altered mental status, unspecified: Secondary | ICD-10-CM

## 2016-10-18 LAB — CBC
HCT: 31.4 % — ABNORMAL LOW (ref 39.0–52.0)
HEMOGLOBIN: 9.6 g/dL — AB (ref 13.0–17.0)
MCH: 24.6 pg — AB (ref 26.0–34.0)
MCHC: 30.6 g/dL (ref 30.0–36.0)
MCV: 80.5 fL (ref 78.0–100.0)
Platelets: 220 10*3/uL (ref 150–400)
RBC: 3.9 MIL/uL — ABNORMAL LOW (ref 4.22–5.81)
RDW: 18.7 % — AB (ref 11.5–15.5)
WBC: 6.7 10*3/uL (ref 4.0–10.5)

## 2016-10-18 LAB — BASIC METABOLIC PANEL
ANION GAP: 14 (ref 5–15)
BUN: 8 mg/dL (ref 6–20)
CHLORIDE: 104 mmol/L (ref 101–111)
CO2: 21 mmol/L — AB (ref 22–32)
Calcium: 9.2 mg/dL (ref 8.9–10.3)
Creatinine, Ser: 0.92 mg/dL (ref 0.61–1.24)
GFR calc Af Amer: >60 mL/min (ref >60)
Glucose, Bld: 129 mg/dL — ABNORMAL HIGH (ref 65–99)
POTASSIUM: 3.2 mmol/L — AB (ref 3.5–5.1)
Sodium: 139 mmol/L (ref 135–145)

## 2016-10-18 LAB — GLUCOSE, CAPILLARY
GLUCOSE-CAPILLARY: 131 mg/dL — AB (ref 65–99)
Glucose-Capillary: 100 mg/dL — ABNORMAL HIGH (ref 65–99)
Glucose-Capillary: 112 mg/dL — ABNORMAL HIGH (ref 65–99)

## 2016-10-18 MED ORDER — HALOPERIDOL LACTATE 5 MG/ML IJ SOLN
1.0000 mg | Freq: Four times a day (QID) | INTRAMUSCULAR | Status: DC | PRN
  Administered 2016-10-18: 1 mg via INTRAVENOUS
  Filled 2016-10-18: qty 1

## 2016-10-18 MED ORDER — CLONAZEPAM 0.5 MG PO TABS: 0.5000 mg | ORAL_TABLET | Freq: Every day | ORAL | Status: DC

## 2016-10-18 MED ORDER — VITAMIN B-1 100 MG PO TABS
400.0000 mg | ORAL_TABLET | Freq: Every day | ORAL | Status: AC
  Administered 2016-10-19 – 2016-10-22 (×4): 400 mg via ORAL
  Filled 2016-10-18 (×5): qty 4

## 2016-10-18 MED ORDER — QUETIAPINE FUMARATE 50 MG PO TABS
50.0000 mg | ORAL_TABLET | Freq: Every day | ORAL | Status: DC
  Administered 2016-10-18 – 2016-10-19 (×2): 50 mg via ORAL
  Filled 2016-10-18 (×2): qty 1

## 2016-10-18 MED ORDER — TAMSULOSIN HCL 0.4 MG PO CAPS
0.4000 mg | ORAL_CAPSULE | Freq: Every day | ORAL | Status: DC
  Administered 2016-10-18 – 2016-10-22 (×5): 0.4 mg via ORAL
  Filled 2016-10-18 (×5): qty 1

## 2016-10-18 NOTE — Progress Notes (Addendum)
Patient ID: Barry Mccoy, male   DOB: 1951-07-21, 66 y.o.   MRN: [id number]  PROGRESS NOTE    Barry Mccoy  [name] DOB: November 24, 1950 DOA: 10/01/2016  PCP: No PCP Per Patient Brief Narrative:  66 year old man with past medical history of migraine headaches (takes keppra), depression who presented to [facility] 10/01/16 after nearly passing out while out shopping. Pt also reported exertional dyspnea, black stools. He was hypotensive in ED and it was thought that this is due to possible GI bleed. He was transfused total of 5 units PRBC during this admission. He developed acute delirium and agitation requiring total of 12 mg ativan which resulted in somnolence and acute respiratory failure with hypoxia and apnea. He was intubated from 1/23 - 1/28. Required precedex infusion, fentanyl and Versed gtt in ICU. He was also started on Seroquel by PCCM. He also had afib/SVT/sinus tachycardia >>placed on amio IV and then converted to sinus tachycardia. Transferred from PCCM to [facility] on 10/12/16 -agitation/delirium continues to be an issue, seen by Psych,  removed ankle restraints 2/6, benzos being weaned  Assessment & Plan:   Acute respiratory failure with hypoxia and inability to protect airways / Aspiration pneumonitis - due to severe agitation and requiring excessive doses of sedatives - Intubated form 1/23 through 1/28 - Has received total of 7 days of zosyn and needs no further antibiotics at this time.  - wean off O2, ambulate, OOB  Delirium secondary to alcohol abuse and withdrawal, ICU delirium, meds etc -Ct head on admission unremarkable and EEG 1/25 s/o encephalopathy, no seizure activity noted -H/o EToh use, lives alone, per daughter drinks couple of drinks daily, ssupect this is more, given portal gastropathy on EGD and Cirrhosis on CT abd -was in ICU, required sedation and intubation, using Precedex drip , was also on fentanyl and versed gtt, precedex wean started 2/1 - started on seroquel  200 mg BID  in ICU by PCCM due to agitation but concern that this med may have cause SVT. Seroquel dose cut to 100 mg BID on 2/1, will taper down and hopefully stop soon - Also on Zoloft 50 mg daily which he takes at home - Patient noted overnight 2/3 to 2/4 to have hallucinations, delusions, combative, verbally abusive and striking out at staff members.4 point restraints placed, now off -seen by Psych, recommended Haldol PRN -I started cutting down Clonazepam dose yesterday, will change to QHS today and then stop, on low dose ativan PRN-which will be stopped as well, sitter at bedside -daughter very upset abt pt getting sedating meds and wants him transferred to [facility] -will ask [facility] for input given family request  Urinary retention -pulled out foley last pm -flomax and voiding trial, if fails will need Foley replaced  Pneumoperitoneum - Noted on CT scan 2/4 - Surgery has seen in consultation; benign abd exam, no evidence of evolving sepsis so surgery not required at this time. Per Dr.Blackman, uncertain of the cause of the free air or how long he has had it. He may have had an intra-abdominal event that is now walled itself off.   Anemia of chronic disease / Acute upper GI bleed - Likely related to portal gastropathy.  - S/P EGD 10/04/2016--with portal gastropathy  - S/P 5 units PRBC transfusion  - now Hb stable  Suspected Cirrhosis on CT Abd -likely due to ETOH, EGD with portal gastropathy noted, s/o of chronic liver disease  Hypokalemia - Due to alcohol abuse - Supplemented  SVT - Lkely related to  agitation. 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 Benson Norway 10/02/2016  PCCM: Dr Nelda Marseille 10/02/2016  Psychiatry 2/4  Surgery 10/14/2016   Procedures:   EGD 1/25>> Portal hypertensive gastropathy versus inflammatory gastritis.  EEG 1/25>> severe diffuse generalized slowing, no seizures  ETT 1/23>>1/28  RIJ CVC 1/25>>  1 unit packed red blood cells 10/01/2016  4 units packed red blood cells 10/02/2016  SIGNIFICANT EVENTS: 1/22 admit, transfused 3 u pRBC 1/23 intubated, transfused 2 u pRBC 1/25 underwent EEG and EGD 1/28 Extubated  Antimicrobials:   IV Zosyn 10/02/2016>>>> 10/09/2016    Subjective: Agitated last pm, got Haldol and then Ativan PRN, back in 4 point restraints Objective: Vitals:   10/17/16 1500 10/17/16 1608 10/18/16 0553 10/18/16 0556  BP:  129/69  (!) 121/91  Pulse:  78  (!) 154  Resp:      Temp: 97.9 F (36.6 C) 98.4 F (36.9 C)  98 F (36.7 C)  TempSrc: Oral Oral  Oral  SpO2:  99%  98%  Weight:   91.7 kg (202 lb 2.6 oz)   Height:        Intake/Output Summary (Last 24 hours) at 10/18/16 1121 Last data filed at 10/18/16 1025  Gross per 24 hour  Intake              251 ml  Output              875 ml  Net             -624 ml   Filed Weights   10/16/16 0313 10/17/16 0319 10/18/16 0553  Weight: 97.2 kg (214 lb 4.6 oz) 97.1 kg (214 lb 1.1 oz) 91.7 kg (202 lb 2.6 oz)    Examination:  General exam: somnolent, wakes up, falls back to sleep, back in restraints, in waist restraints, oriented to self only Respiratory system: No wheezing, no rhonchi  Cardiovascular system: S1 & S2 heard, Rate controlled  Gastrointestinal system: soft, NT, BS present  Central nervous system: moves all extremities Extremities: No swelling, palpable pulses  Skin: Skin is warm and dry  Psychiatry: normal behavior  Data Reviewed: I have personally reviewed following labs and imaging studies  CBC:  Recent Labs Lab 10/14/16 0105  10/15/16 0213 10/16/16 1255 10/17/16 0706 10/18/16 0454  WBC 6.2 6.5 7.3 5.6 6.7  HGB 10.4* 10.1* 10.7* 9.8* 9.6*  HCT 34.1* 33.5* 35.1* 32.4* 31.4*  MCV 81.8 80.9 81.6 81.4 80.5  PLT 178 191 222 203 [id number]   Basic Metabolic Panel:  Recent Labs Lab 10/12/16 0339 10/13/16 0213 10/14/16 0105 10/15/16 0213 10/16/16 1255 10/17/16 0706 10/18/16 0454  NA 141 140 139 140 140 140 139  K 3.2* 4.1 3.6 3.3* 3.6 3.6 3.2*  CL 108 108 105 108 107 104 104  CO2 22 24 24 22 22 24  21*  GLUCOSE 117* 110* 116* 111* 103* 95 129*  BUN 9 8 7 7 8 7 8   CREATININE 0.87 0.88 0.83 0.89 0.95 0.95 0.92  CALCIUM 8.6* 8.7* 9.1 8.8* 9.2 9.2 9.2  MG 2.0 2.2 2.1 2.0  1.9  --   --   PHOS 3.6 3.7 2.5 3.2  --   --   --    GFR: Estimated Creatinine Clearance: 91.8 mL/min (by C-G formula based on SCr of 0.92 mg/dL). Liver Function Tests: No results for input(s): AST, ALT, ALKPHOS, BILITOT, PROT, ALBUMIN in the last 168 hours. No results for input(s): LIPASE, AMYLASE in the last 168 hours.  Recent Labs Lab 10/14/16 0639  AMMONIA 14   Coagulation Profile: No results for input(s): INR, PROTIME in the last 168 hours. Cardiac Enzymes:  Recent Labs Lab 10/14/16 0108 10/14/16 0635 10/14/16 1220  TROPONINI <0.03 <0.03 <0.03   BNP (last 3 results) No results for input(s): PROBNP in the last 8760 hours. HbA1C: No results for input(s): HGBA1C in the last 72 hours. CBG:  Recent Labs Lab 10/17/16 0738 10/17/16 1223 10/17/16 1535 10/18/16 0547 10/18/16 0755  GLUCAP 96 93 117* 131* 100*   Lipid Profile: No results for input(s): CHOL, HDL, LDLCALC, TRIG, CHOLHDL, LDLDIRECT in the last 72 hours. Thyroid Function Tests: No results for input(s): TSH, T4TOTAL, FREET4, T3FREE, THYROIDAB in the last 72 hours. Anemia Panel: No results for input(s): VITAMINB12, FOLATE, FERRITIN, TIBC, IRON, RETICCTPCT in the last 72 hours. Urine analysis:    Component Value Date/Time   COLORURINE YELLOW 10/14/2016 0505    APPEARANCEUR CLEAR 10/14/2016 0505   LABSPEC 1.012 10/14/2016 0505   PHURINE 7.0 10/14/2016 0505   GLUCOSEU NEGATIVE 10/14/2016 0505   HGBUR NEGATIVE 10/14/2016 0505   BILIRUBINUR NEGATIVE 10/14/2016 0505   KETONESUR NEGATIVE 10/14/2016 0505   PROTEINUR NEGATIVE 10/14/2016 0505   NITRITE NEGATIVE 10/14/2016 0505   LEUKOCYTESUR NEGATIVE 10/14/2016 0505   Sepsis Labs: @LABRCNTIP (procalcitonin:4,lacticidven:4)   )No results found for this or any previous visit (from the past 240 hour(s)).    Radiology Studies: Ct Head Wo Contrast Result Date: 10/14/2016 No acute finding or change from prior.   Ct Angio Chest Pe W Or Wo Contrast Result Date: 10/14/2016 1. Free intraperitoneal air within the upper abdomen. In the absence of a recent surgery, this suggests viscus perforation. CT abdomen and pelvis is recommended for further characterization. 2. No pulmonary embolism seen, with study limitations detailed above. 3. Small right pleural effusion. Mild bibasilar atelectasis. Mild atelectasis versus edema within the upper lobes bilaterally. No evidence of pneumonia. 4. Free fluid in the left upper abdomen. Mass versus fluid collection adjacent to the pancreatic tail, incompletely imaged. Cholelithiasis, incompletely imaged. 5. Aortic atherosclerosis.   Ct Abdomen Pelvis W Contrast Result Date: 10/14/2016 1. No definitive source for the patient's moderate pneumoperitoneum. Given there is no inflammatory changes or visible ulcer along the stomach or proximal duodenum, a diverticular source is favored. There may be mild diverticulitis along the splenic flexure, but no extraluminal gas in this region. 2. 26 mm fluid collection in the splenic fossa. Suspect pseudocyst as there is ductal or parenchymal calcification in the pancreatic tail. Pancreatic tail is full and pancreas protocol CT or MRI follow-up is recommended after convalescence to exclude mass. 3. Chronic splenic vein occlusion with collaterals  including gastric varices. Patient was admitted with GI bleeding. 4. Cholelithiasis. 5. Suspected cirrhosis. 6. Splenomegaly.    Scheduled Meds: . baclofen  20 mg Oral QHS  . [START ON 10/19/2016] clonazePAM  0.5 mg Oral QHS  . feeding supplement (ENSURE ENLIVE)  237 mL Oral BID BM  . folic acid  1 mg Oral Daily  . levETIRAcetam  500 mg Oral Daily  . mouth rinse  15  mL Mouth Rinse BID  . nicotine  14 mg Transdermal Daily  . pantoprazole  40 mg Oral QHS  . QUEtiapine  50 mg Oral QHS  . sertraline  50 mg Oral Daily  . thiamine  100 mg Oral Daily   Continuous Infusions:    LOS: 17 days    Time spent: 35 minutes  Greater than 50% of the time spent on counseling and coordinating the care.   Domenic Polite, MD [facility] Pager [phone] If 7PM-7AM, please contact night-coverage [email or web address] Password [email or web address] 10/18/2016, 11:21 AM

## 2016-10-18 NOTE — Consult Note (Signed)
NEURO HOSPITALIST CONSULT NOTE   Requesting physician: [facility]     Reason for Consult: Delirium/agitation    History obtained from:  Patient, Daughter, and Chart     HPI:                                                                                                                                          Barry Mccoy is an 66 y.o. man with PMHx of migraines, depression, and hx alcohol abuse with suspected cirrhosis who was admitted on 10/01/16 after presenting with dyspnea, near syncope, black stools, and hypotension with a Hgb of 4.1 concerning for upper GI bleed. He underwent EGD on 1/25 and was found to have portal hypertensive gastropathy vs inflammatory gastritis. He received a total of 5 units PRBCs. He was intubated due to hypoxic respiratory failure and inability to protect his airway related to severe agitation and requiring excessive doses of sedatives. He was extubated on 1/28. Since that time, he has had extensive agitation and delirium that has been attributed to alcohol withdrawal, ICU delirium, and sedative needs. He has been evaluated by psychiatry on 2/5 and 2/7 who recommended neurology evaluation.  Patient is pleasant on evaluation. He is making jokes and denies any symptoms that are bothering him. He does note a hx of depression but is unable to tell me about which medications he is on at home. His daughter is at the bedside and reports he developed severe depression after his wife died last 01-01-2023. She states he was on Zoloft at home and had been doing ok on that. He denies any hallucinations at this time. He denies chest pain, cough, shortness of breath, abdominal pain, nausea, vomiting, constipation, or difficulty urinating. Reports he drinks 1-2 [employer] St[employer] cocktails per day. Daughter states he used to have a drinking problem, but has noticed he has cut down significantly.   Since his admission, he had a CT head on 1/24 and 2/4 which were both negative. EEG  on 1/25 was consistent with a global encephalopathic process, negative for seizure. CT abd/pelvis concerning for pneumoperitoneum and the pancreatic tail noted to be full. Further imaging recommended to rule out mass. General surgery had been consulted and felt his abdominal exam was benign, no surgical intervention warranted.   Past Medical History:  Diagnosis Date  . Muscle spasm     Past Surgical History:  Procedure Laterality Date  . ELBOW SURGERY    . ESOPHAGOGASTRODUODENOSCOPY N/A 10/04/2016   Procedure: ESOPHAGOGASTRODUODENOSCOPY (EGD);  Surgeon: Carol Ada, MD;  Location: [facility];  Service: Gastroenterology;  Laterality: N/A;    Family History  Problem Relation Age of Onset  . Colon cancer Brother     Family History: Brother- colon cancer   Social History:  reports that he has  been smoking Cigarettes.  He has been smoking about 1.00 pack per day. He has never used smokeless tobacco. He reports that he does not drink alcohol or use drugs.  Allergies  Allergen Reactions  . Shellfish Allergy Other (See Comments)    Unknown     MEDICATIONS:                                                                                                                     I have reviewed the patient's current medications.   ROS:                                                                                                                                       History obtained from the patient and daughter  General ROS: negative for - chills, fatigue, fever, night sweats, weight gain or weight loss Psychological ROS: negative for - behavioral disorder, hallucinations, memory difficulties, mood swings or suicidal ideation Ophthalmic ROS: negative for - blurry vision, double vision, eye pain or loss of vision ENT ROS: negative for - epistaxis, nasal discharge, oral lesions, sore throat, tinnitus or vertigo Allergy and Immunology ROS: negative for - hives or itchy/watery  eyes Hematological and Lymphatic ROS: negative for - bleeding problems, bruising or swollen lymph nodes Endocrine ROS: negative for - galactorrhea, hair pattern changes, polydipsia/polyuria or temperature intolerance Respiratory ROS: negative for - cough, hemoptysis, shortness of breath or wheezing Cardiovascular ROS: negative for - chest pain, dyspnea on exertion, edema or irregular heartbeat Gastrointestinal ROS: negative for - abdominal pain, diarrhea, hematemesis, nausea/vomiting or stool incontinence Genito-Urinary ROS: negative for - dysuria, hematuria, incontinence or urinary frequency/urgency Musculoskeletal ROS: negative for - joint swelling or muscular weakness Neurological ROS: as noted in HPI Dermatological ROS: negative for rash and skin lesion changes   Blood pressure (!) 121/91, pulse (!) 154, temperature 98 F (36.7 C), temperature source Oral, resp. rate (!) 22, height 6\' 2"  (1.88 m), weight 202 lb 2.6 oz (91.7 kg), SpO2 98 %.   Neurologic Examination:  HEENT-  [place]/AT, EOMI, sclera anicteric, mucus membranes moist Cardiovascular- Tachycardic, regular, normal S1/S2, no m/g/r Lungs- CTA bilaterally, breaths non-labored  Abdomen- BS+, soft, non-tender, non-distended  Extremities- warm, no peripheral edema  Lymph-no adenopathy palpable Musculoskeletal- moves all extremities, no deformities  Skin-warm and dry, no hyperpigmentation, vitiligo, or suspicious lesions  Neurological Examination Mental Status: Alert. Oriented to self. He is able to tell me he is at [facility], but does not know which floor. He knows the month but not the year. He does not know why he is at the hospital. Speech incoherent at times. He is pleasant, no agitation noted. Able to follow commands.  Cranial Nerves: II: Visual fields grossly normal, PERRLA III,IV, VI: ptosis not present, extra-ocular  motions intact bilaterally V,VII: smile symmetric, facial light touch sensation normal bilaterally VIII: hearing normal bilaterally IX,X: uvula rises symmetrically XI: bilateral shoulder shrug XII: midline tongue extension Motor: Right : Upper extremity   5/5    Left:     Upper extremity   5/5  Lower extremity   5/5     Lower extremity   5/5 Tone and bulk:normal tone throughout; no atrophy noted Palmomental reflex + bilaterally Sensory: Decreased light touch to right lower extremity Deep Tendon Reflexes: 2+ and symmetric throughout Plantars: Right: downgoing   Left: downgoing Cerebellar: Difficulty with finger to nose bilaterally. Normal heel-to-shin test Gait: Unable to assess gait   Lab Results: Basic Metabolic Panel:  Recent Labs Lab 10/12/16 0339 10/13/16 0213 10/14/16 0105 10/15/16 0213 10/16/16 1255 10/17/16 0706 10/18/16 0454  NA 141 140 139 140 140 140 139  K 3.2* 4.1 3.6 3.3* 3.6 3.6 3.2*  CL 108 108 105 108 107 104 104  CO2 22 24 24 22 22 24  21*  GLUCOSE 117* 110* 116* 111* 103* 95 129*  BUN 9 8 7 7 8 7 8   CREATININE 0.87 0.88 0.83 0.89 0.95 0.95 0.92  CALCIUM 8.6* 8.7* 9.1 8.8* 9.2 9.2 9.2  MG 2.0 2.2 2.1 2.0 1.9  --   --   PHOS 3.6 3.7 2.5 3.2  --   --   --     Recent Labs Lab 10/14/16 0639  AMMONIA 14    CBC:  Recent Labs Lab 10/14/16 0105 10/15/16 0213 10/16/16 1255 10/17/16 0706 10/18/16 0454  WBC 6.2 6.5 7.3 5.6 6.7  HGB 10.4* 10.1* 10.7* 9.8* 9.6*  HCT 34.1* 33.5* 35.1* 32.4* 31.4*  MCV 81.8 80.9 81.6 81.4 80.5  PLT 178 191 222 203 220    Cardiac Enzymes:  Recent Labs Lab 10/14/16 0108 10/14/16 0635 10/14/16 1220  TROPONINI <0.03 <0.03 <0.03    CBG:  Recent Labs Lab 10/17/16 0738 10/17/16 1223 10/17/16 1535 10/18/16 0547 10/18/16 0755  GLUCAP 96 93 117* 131* 100*    Imaging: Dg Swallowing Func-speech Pathology  Result Date: 10/17/2016 Objective Swallowing Evaluation: Type of Study: MBS-Modified Barium Swallow  Study Patient Details Name: Amdrew Mcrobie MRN: [id number] Date of Birth: August 14, 1951 Today's Date: 10/17/2016 Time: SLP Start Time (ACUTE ONLY): 1145-SLP Stop Time (ACUTE ONLY): 1202 SLP Time Calculation (min) (ACUTE ONLY): 17 min Past Medical History: Past Medical History: Diagnosis Date . Muscle spasm  Past Surgical History: Past Surgical History: Procedure Laterality Date . ELBOW SURGERY   . ESOPHAGOGASTRODUODENOSCOPY N/A 10/04/2016  Procedure: ESOPHAGOGASTRODUODENOSCOPY (EGD);  Surgeon: Carol Ada, MD;  Location: [facility];  Service: Gastroenterology;  Laterality: N/A; HPI: 66 year old male admitted 10/01/16 due to near syncope, AMS, acute encephalopathy and intermittent hypoxia. Pt intubated  1/24-28/18 due to respiratory distress. UGI 10/04/16 - hypertensive gastropathy vs inflammatory gastritis. CXR 10/07/16 - bilateral airspace disease. PMH unremarkable. No Data Recorded Assessment / Plan / Recommendation CHL IP CLINICAL IMPRESSIONS 10/17/2016 Therapy Diagnosis Mild pharyngeal phase dysphagia;Mild oral phase dysphagia Clinical Impression Pt demonstrates a mild oropharyngeal dysphagia, likely secondary to sluggish motor control and lethargy associated with this acute illness. Pt difficult to arouse for MBS, continues to be dysarthric and impulsive with self feeding. Primary problem is impulsive, overlarge intake of cup and straw sips of thin liquids leading to premature spillage into the vestibule and delayed swallow initiation with penetration before the swallow. Penetration events are silent, but clear with a cued throat clear. Pts dentures were not present for this test, but mastication of solids was low, requiring verbal cues. Recommend pt continue current diet recommendation of nectar thick liquids and dys 2(fine chopped solids) until mentation improves and arousal is consistent. Pt should not remain on nectar thick liquids long term as dysphagia is relatively mild. Will discuss with pts daughter.  Impact on  safety and function Mild aspiration risk   CHL IP TREATMENT RECOMMENDATION 10/17/2016 Treatment Recommendations Therapy as outlined in treatment plan below   Prognosis 10/17/2016 Prognosis for Safe Diet Advancement Good Barriers to Reach Goals Cognitive deficits Barriers/Prognosis Comment -- CHL IP DIET RECOMMENDATION 10/17/2016 SLP Diet Recommendations Dysphagia 2 (Fine chop) solids;Nectar thick liquid Liquid Administration via Cup;Straw Medication Administration Whole meds with puree Compensations Slow rate;Small sips/bites Postural Changes Seated upright at 90 degrees   CHL IP OTHER RECOMMENDATIONS 10/17/2016 Recommended Consults -- Oral Care Recommendations Oral care BID Other Recommendations Order thickener from pharmacy   CHL IP FOLLOW UP RECOMMENDATIONS 10/17/2016 Follow up Recommendations Skilled Nursing facility   [facility] IP FREQUENCY AND DURATION 10/17/2016 Speech Therapy Frequency (ACUTE ONLY) min 2x/week Treatment Duration 2 weeks      CHL IP ORAL PHASE 10/17/2016 Oral Phase Impaired Oral - Pudding Teaspoon -- Oral - Pudding Cup -- Oral - Honey Teaspoon -- Oral - Honey Cup -- Oral - Nectar Teaspoon -- Oral - Nectar Cup -- Oral - Nectar Straw WFL Oral - Thin Teaspoon -- Oral - Thin Cup Premature spillage Oral - Thin Straw Premature spillage Oral - Puree Decreased bolus cohesion;Delayed oral transit Oral - Mech Soft Decreased bolus cohesion;Delayed oral transit;Lingual/palatal residue;Impaired mastication Oral - Regular -- Oral - Multi-Consistency -- Oral - Pill -- Oral Phase - Comment --  CHL IP PHARYNGEAL PHASE 10/17/2016 Pharyngeal Phase Impaired Pharyngeal- Pudding Teaspoon -- Pharyngeal -- Pharyngeal- Pudding Cup -- Pharyngeal -- Pharyngeal- Honey Teaspoon -- Pharyngeal -- Pharyngeal- Honey Cup -- Pharyngeal -- Pharyngeal- Nectar Teaspoon -- Pharyngeal -- Pharyngeal- Nectar Cup WFL Pharyngeal -- Pharyngeal- Nectar Straw WFL Pharyngeal -- Pharyngeal- Thin Teaspoon -- Pharyngeal -- Pharyngeal- Thin Cup Delayed swallow  initiation-pyriform sinuses;Penetration/Aspiration before swallow Pharyngeal Material enters airway, remains ABOVE vocal cords then ejected out;Material does not enter airway;Material enters airway, CONTACTS cords and then ejected out Pharyngeal- Thin Straw Delayed swallow initiation-pyriform sinuses;Penetration/Aspiration before swallow Pharyngeal Material enters airway, CONTACTS cords and not ejected out Pharyngeal- Puree WFL Pharyngeal -- Pharyngeal- Mechanical Soft WFL Pharyngeal -- Pharyngeal- Regular -- Pharyngeal -- Pharyngeal- Multi-consistency -- Pharyngeal -- Pharyngeal- Pill -- Pharyngeal -- Pharyngeal Comment --  No flowsheet data found. No flowsheet data found. Herbie [place], [place] CCC-SLP [phone] Othelia Pulling Katherene Ponto 10/17/2016, 2:07 PM                Assessment/Plan:  Mr. Botha is a 66yo man with PMHx of  depression, migraines, and alcohol abuse with suspected cirrhosis who was admitted for acute upper GI bleed and hospitalization has been complicated by acute delirium and agitation.  Acute Delirium: Infection does not seem to be the cause for his delirium. He has not had any fevers, WBC count normal, recent UA on 2/4 nml, and CXR on 2/1 without pneumonia. He did have pneumoperitonium of unknown etiology found on CT abd/pelvis on 2/4, but his abdominal exam is benign. I do not think LP is warranted given his lack of fevers and normal WBC count and putting him through that procedure would likely cause more harm than benefit. CTA chest was negative for PE. Ammonia level on 2/4 rechecked and was normal, at baseline. TSH nml on 2/4. He has been without alcohol since admission, which was over 2 weeks ago, so doubt alcohol withdrawal contributing. EEG on 1/25 negative for seizures. I doubt this is related to stroke as his delirium/agitation has been present his entire hospitalization. Likely a large portion of his delirium has been related to the sedating medications he has been receiving plus  delirium on likely underlying dementia. He had a positive palmomental reflex and his CT images show extensive atrophy oh his frontal lobe. He will need outpatient neuropsychologic testing once he is back to baseline. Agree with reducing/stopping benzos and seroquel, would stop Keppra and Tramadol as well. Keep blinds open (they were closed when I was in room). Avoid anticholinergics. Will increase Thiamine to higher dose (400 mg) for 4 days and then can be continued on normal dose (100 mg daily). Ok to be discharged to SNF per Neuro perspective.  Attending note to follow  Albin Felling, MD, MPH Internal Medicine Resident, PGY-III Pager: [phone]

## 2016-10-18 NOTE — Progress Notes (Signed)
Physical Therapy Treatment Patient Details Name: Barry Mccoy MRN: [id number] DOB: 05/05/1951 Today's Date: 10/18/2016    History of Present Illness Pt is a 66 yo male admitted for syncope and GI bleeding. Pt found to have Acute Encephalopathy and intermittent hypoxia. VDRF 1/23-1/28. Pt with know ETOH abuse.    PT Comments    Pt admitted with above diagnosis. Pt currently with functional limitations due to the deficits listed below (see PT Problem List). Pt was able to ambulate with PT with +2 min assist and RW.  Pt asking for a cigarette.  PT explained we could walk in halls.  Pt was agreeable and followed commands for the most part.  No agitation noted during PT session.  Will follow acutely. Pt will benefit from skilled PT to increase their independence and safety with mobility to allow discharge to the venue listed below.    Follow Up Recommendations  SNF;Supervision/Assistance - 24 hour     Equipment Recommendations  Other (comment) (TBD)    Recommendations for Other Services       Precautions / Restrictions Precautions Precautions: Fall Precaution Comments: posey all 4 restraints Restrictions Weight Bearing Restrictions: No    Mobility  Bed Mobility Overal bed mobility: Needs Assistance Bed Mobility: Supine to Sit;Sit to Sidelying Rolling: Supervision;Min guard Sidelying to sit: Supervision;Min guard Supine to sit: Supervision;Min guard Sit to supine: Min guard Sit to sidelying: Min guard General bed mobility comments: Assist for all aspects as pt still confused.  Cues needed to keep pt oriented.  Transfers Overall transfer level: Needs assistance Equipment used: Rolling walker (2 wheeled) Transfers: Sit to/from Stand Sit to Stand: Min assist;+2 safety/equipment         General transfer comment: Assist initiially for balance. Pt needed steadying assist intially when up.    Ambulation/Gait Ambulation/Gait assistance: Min assist;+2 physical assistance Ambulation  Distance (Feet): 180 Feet (90 x2) Assistive device: Rolling walker (2 wheeled) Gait Pattern/deviations: Step-through pattern;Decreased stride length;Trunk flexed;Leaning posteriorly   Gait velocity interpretation: Below normal speed for age/gender General Gait Details: Pt needed cues throughout walk as he had difficulty focusing on the task.  Needed cues to stay close to RW.  At times he wanted to lean and rest on RW and had to be redirected as this was not safe.  Walked out to waiting area and pt had a sitting rest break and then walked back to room.  Cooperative during session.  As pt fatigued, he would flex his posture more needing cues to stand tall.     Stairs            Wheelchair Mobility    Modified Rankin (Stroke Patients Only)       Balance Overall balance assessment: Needs assistance Sitting-balance support: Feet supported;No upper extremity supported Sitting balance-Leahy Scale: Fair Sitting balance - Comments: Pt sat EOB with min guard assist only    Standing balance support: Bilateral upper extremity supported Standing balance-Leahy Scale: Poor Standing balance comment: walker and +2 min guard A to maintain static standing for safety              High level balance activites: Direction changes;Turns;Sudden stops High Level Balance Comments: min assist as pt has more difficulty with challenges to balance.     Cognition Arousal/Alertness: Awake/alert Behavior During Therapy: Flat affect Overall Cognitive Status: Impaired/Different from baseline Area of Impairment: Following commands;Problem solving;Safety/judgement;Memory;Attention;Orientation Orientation Level: Time;Place;Situation Current Attention Level: Focused Memory: Decreased short-term memory Following Commands: Follows one step commands with increased time;Follows  one step commands inconsistently Safety/Judgement: Decreased awareness of safety;Decreased awareness of deficits   Problem Solving:  Slow processing;Difficulty sequencing;Requires verbal cues;Requires tactile cues;Decreased initiation General Comments: Pt required frequent verbal/tactile stimulation follow commands.  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  Confused.    Exercises General Exercises - Lower Extremity Ankle Circles/Pumps: AROM;Both;10 reps;Supine Long Arc Quad: AROM;Both;Seated;10 reps    General Comments        Pertinent Vitals/Pain Pain Assessment: No/denies pain  VSS  Home Living                      Prior Function            PT Goals (current goals can now be found in the care plan section) Progress towards PT goals: Progressing toward goals    Frequency    Min 2X/week      PT Plan Current plan remains appropriate;Frequency needs to be updated    Co-evaluation             End of Session Equipment Utilized During Treatment: Gait belt Activity Tolerance: Patient limited by fatigue Patient left: with call bell/phone within reach;with restraints reapplied;in bed;with bed alarm set;with family/visitor present;with nursing/sitter in room (sitter in room, daughter arrived from out of town)     Time: [phone] PT Time Calculation (min) (ACUTE ONLY): 21 min  Charges:  $Gait Training: 8-22 mins                    G Codes:      Godfrey Pick Jove Beyl 11/15/16, 10:39 AM [facility] [phone] (pager)

## 2016-10-19 LAB — GLUCOSE, CAPILLARY
GLUCOSE-CAPILLARY: 104 mg/dL — AB (ref 65–99)
GLUCOSE-CAPILLARY: 107 mg/dL — AB (ref 65–99)
Glucose-Capillary: 112 mg/dL — ABNORMAL HIGH (ref 65–99)
Glucose-Capillary: 128 mg/dL — ABNORMAL HIGH (ref 65–99)
Glucose-Capillary: 113 mg/dL — ABNORMAL HIGH (ref 65–99)
Glucose-Capillary: 133 mg/dL — ABNORMAL HIGH (ref 65–99)

## 2016-10-19 MED ORDER — THIAMINE HCL 100 MG PO TABS: 400.0000 mg | ORAL_TABLET | Freq: Every day | ORAL | Status: DC

## 2016-10-19 MED ORDER — FOLIC ACID 1 MG PO TABS
1.0000 mg | ORAL_TABLET | Freq: Every day | ORAL | Status: DC
Start: 2016-10-20 — End: 2016-10-21

## 2016-10-19 MED ORDER — NICOTINE 14 MG/24HR TD PT24: 14.0000 mg | MEDICATED_PATCH | Freq: Every day | TRANSDERMAL | 0 refills | Status: DC

## 2016-10-19 MED ORDER — PANTOPRAZOLE SODIUM 40 MG PO TBEC: 40.0000 mg | DELAYED_RELEASE_TABLET | Freq: Every day | ORAL | Status: DC

## 2016-10-19 MED ORDER — TRAZODONE HCL 100 MG PO TABS: 200.0000 mg | ORAL_TABLET | Freq: Every day | ORAL | Status: DC

## 2016-10-19 MED ORDER — BACLOFEN 20 MG PO TABS: 20.0000 mg | ORAL_TABLET | Freq: Every day | ORAL | 0 refills | Status: DC

## 2016-10-19 NOTE — Discharge Summary (Addendum)
Physician Discharge Summary  Barry Mccoy [id number] DOB: 01/29/1951 DOA: 10/01/2016  PCP: No PCP Per Patient  Admit date: 10/01/2016 Discharge date: 10/21/2016  Time spent: 45 minutes  Recommendations for Outpatient Follow-up:  1. Needs Neuro Cognitive testing down the road, wean off low dose Seroquel and Haldol PRN as tolerated 2. Foley Care, Voiding trial in about 1 week, will need Urology referral if fails this 3. Needs MRI Abd in 1-79months to re-evaluate possible Pancreatic cyst noted on CT abdomen 4. Needs repeat SLP eval at SNF in few days and upgrading diet 5. GI Dr.Hung in 67month for GI bleed and Pancreatic cystic lesion  Discharge Diagnoses:  Principal Problem:   Acute GI bleeding   Symptomatic anemia   Delirium/AGitation   ICU Delirium   Alcohol withdrawal   Acute respiratory failure (HCC)   Severe single current episode of major depressive disorder, with psychotic features ([place])   Acute encephalopathy   Cirrhosis on Imaging   Alcohol use disorder     Discharge Condition: improved  Diet recommendation: Dysphagia 2 diet with nector thick liquids  Filed Weights   10/17/16 0319 10/18/16 0553 10/20/16 2019  Weight: 97.1 kg (214 lb 1.1 oz) 91.7 kg (202 lb 2.6 oz) 91.4 kg (201 lb 8 oz)    History of present illness:  66 year old man with past medical history of migraine headaches (takes keppra), depression who presented to [facility] 10/01/16 after nearly passing out while out shopping. Pt also reported exertional dyspnea, black stools. He was hypotensive in ED and it was thought that this is due to possible GI bleed. He was transfused total of 5 units PRBC during this admission. He developed acute delirium and agitation.   Hospital Course:  66 year old man with past medical history of migraine headaches (takes keppra), depression who presented to [facility] 10/01/16 after nearly passing out while out shopping. Pt also reported exertional dyspnea, black stools. He was hypotensive in ED  and it was thought that this is due to possible GI bleed. He was transfused total of 5 units PRBC during this admission. He developed acute delirium and agitation requiring high does of Ativan resulted in somnolence and acute respiratory failure with hypoxia and apnea. He was intubated from 1/23 - 1/28. Required precedex infusion, fentanyl and Versed gtt in ICU. He was also started on Seroquel by PCCM. He also had afib/SVT/sinus tachycardia >>placed on amio IV and then converted to sinus tachycardia. Transferred from PCCM to [facility] on 10/12/16 -agitation/delirium continues to be an issue but finally improving, benzos being weaned and Seroquel being weaned off too. Has also required restraints now off  Detailed Plan:   Acute respiratory failure with hypoxia and inability to protect airways / Aspiration pneumonitis - due to severe agitation and requiring excessive doses of sedatives - Intubated form 1/23 through 1/28 - Has received total of 7 days of zosyn and needs no further antibiotics at this time.  - weaned off O2, resolved  Delirium secondary to alcohol abuse and withdrawal, ICU delirium, meds etc -severe agitation and delirium noted after admsision -Ct head on admission unremarkable and EEG 1/25 s/o encephalopathy, no seizure activity noted -H/o EToh use, lives alone, per daughter drinks couple of drinks daily, suspect this is more, given portal gastropathy on EGD and Cirrhosis on CT abd -was in ICU, required sedation and intubation, using Precedex drip , was also on fentanyl and versed gtt. - started on seroquel  200 mg BID in ICU by PCCM due to agitation but concern that  this med may have cause SVT. Seroquel dose cut to 100 mg BID on 2/1, now tapered down to 50mg  QHS,  remains on Zoloft 50 mg daily which he takes at home. - Patient noted overnight 2/3 to 2/4 to have hallucinations, delusions, combative, verbally abusive and striking out at staff members.4 point restraints placed, now  off -seen by Psych, recommended Haldol PRN and decreasing benzos -I started cutting down Clonazepam dose day before now stopped. -Seen by [facility], recommended high dose thiamine for few days and recommended stopping Keppra since he had no clear indication for being on this. -plan for discharge to SNF and then Neurocognitive testing down the road, please wean off Seroquel slowly and Haldol at SNF  Urinary retention -failed voiding trial yesterday, foley replaced, continue FLomax -voiding trial in 1 week at SNF, Urology Follow up  Pneumoperitoneum - Noted on CT scan 2/4 - Surgery has seen in consultation; benign abd exam, no evidence of evolving sepsis so surgery not required at this time. Per Dr.Blackman, uncertain of the cause of the free air or how long he has had it. He may have had an intra-abdominal event that is now walled itself off.  -no abd symptoms, tolerating diet  Anemia of chronic disease / Acute upper GI bleed - Likely related to portal gastropathy.  - S/P EGD 10/04/2016--with portal gastropathy  - S/P 5 units PRBC transfusion  - now Hb stable  Suspected Cirrhosis on CT Abd -likely due to ETOH, EGD with portal gastropathy noted, s/o of chronic liver disease -advised FU with GI  Hypokalemia - Due to alcohol abuse - Supplemented  SVT - while in ICU,  CTA negative for PE - Started on amiodarone drip on 2/1 but he is now in sinus rhythm  - D/C'ed amio 2/1, started low dose metoprolol  AKI - Resolvedwith fluids - Cr WNL  Suspected pseudocyst in the pancreas -needs Outpatient MRI abd to re-evaluate pancreas/cyst etc  Consultations:  Neurology  GI  PCCM  Discharge Exam: Vitals:   10/20/16 2203 10/21/16 0505  BP: 130/84 139/79  Pulse: (!) 146 91  Resp: 20 20  Temp: 98.6 F (37 C) 98.4 F (36.9 C)    General: alert, awake, intermittently confused, oriented to self, partly to place Cardiovascular: S!S2/RRR Respiratory: CTAB  Discharge  Instructions   Discharge Instructions    Discharge instructions    Complete by:  As directed    Dysphagia 2 diet, nectar thick liquids   Increase activity slowly    Complete by:  As directed      Current Discharge Medication List    START taking these medications   Details  folic acid (FOLVITE) 1 MG tablet Take 1 tablet (1 mg total) by mouth daily.    haloperidol (HALDOL) 1 MG tablet Take 1 tablet (1 mg total) by mouth every 8 (eight) hours as needed for severe agitation. Qty: 15 tablet, Refills: 0    nicotine (NICODERM CQ - DOSED IN MG/24 HOURS) 14 mg/24hr patch Place 1 patch (14 mg total) onto the skin daily. Qty: 28 patch, Refills: 0    pantoprazole (PROTONIX) 40 MG tablet Take 1 tablet (40 mg total) by mouth daily.    QUEtiapine (SEROQUEL) 50 MG tablet Take 1 tablet (50 mg total) by mouth at bedtime.    thiamine 100 MG tablet Take 4 tablets (400 mg total) by mouth daily. For 4days then down to 100mg  daily      CONTINUE these medications which have CHANGED  Details  baclofen (LIORESAL) 20 MG tablet Take 1 tablet (20 mg total) by mouth at bedtime. Refills: 0    traZODone (DESYREL) 100 MG tablet Take 2 tablets (200 mg total) by mouth at bedtime.      CONTINUE these medications which have NOT CHANGED   Details  aspirin EC 81 MG tablet Take 81 mg by mouth daily.    Cholecalciferol (VITAMIN D-3) 5000 units TABS Take 5,000 Units by mouth at bedtime.    lidocaine (LIDODERM) 5 % Place 2 patches onto the skin daily as needed (pain). Remove & Discard patch within 12 hours or as directed by MD    Multiple Vitamin (MULTIVITAMIN WITH MINERALS) TABS tablet Take 1 tablet by mouth at bedtime.    OnabotulinumtoxinA (BOTOX IJ) Inject as directed See admin instructions. Botox injections done at Dr. Audery Amel office approximately every 90 days    sertraline (ZOLOFT) 50 MG tablet Take 50 mg by mouth at bedtime.    tamsulosin (FLOMAX) 0.4 MG CAPS capsule Take 0.4 mg by mouth at  bedtime.      STOP taking these medications     levETIRAcetam (KEPPRA) 500 MG tablet      NON FORMULARY      diphenhydrAMINE (BENADRYL) 25 MG tablet      loratadine (CLARITIN) 10 MG tablet      naproxen sodium (ALEVE) 220 MG tablet      OVER THE COUNTER MEDICATION      OVER THE COUNTER MEDICATION      OVER THE COUNTER MEDICATION      promethazine (PHENERGAN) 25 MG tablet        Allergies  Allergen Reactions  . Shellfish Allergy Other (See Comments)    Unknown    Follow-up Information    pcp. Schedule an appointment as soon as possible for a visit in 1 week(s).            The results of significant diagnostics from this hospitalization (including imaging, microbiology, ancillary and laboratory) are listed below for reference.    Significant Diagnostic Studies: Ct Head Wo Contrast  Result Date: 10/14/2016 CLINICAL DATA:  Altered mental status. EXAM: CT HEAD WITHOUT CONTRAST TECHNIQUE: Contiguous axial images were obtained from the base of the skull through the vertex without intravenous contrast. COMPARISON:  10/03/2016 FINDINGS: Brain: No evidence of acute infarction, hemorrhage, hydrocephalus, or mass lesion/mass effect. Small hygroma posterior to the left cerebellum is stable at 4 mm maximal thickness. No significant mass effect. Stable pattern of mild chronic microvascular disease. Dilated perivascular space versus remote lacune along the lower right putamen. Vascular: Atherosclerotic calcification.  No hyperdense vessel. Skull: No acute finding Sinuses/Orbits: Negative IMPRESSION: No acute finding or change from prior. Electronically Signed   By: Monte Fantasia M.D.   On: 10/14/2016 16:04   Ct Head Wo Contrast  Result Date: 10/03/2016 CLINICAL DATA:  66 year old male with acute encephalopathy. EXAM: CT HEAD WITHOUT CONTRAST TECHNIQUE: Contiguous axial images were obtained from the base of the skull through the vertex without intravenous contrast. COMPARISON:  None.  FINDINGS: Brain: The ventricles and sulci appropriate in size for patient's age. Mild periventricular and deep white matter chronic microvascular ischemic changes noted. A focal area of hypodensity in the right occipital subcortical white matter likely represent chronic microvascular ischemic changes. 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 There is no acute intracranial hemorrhage. No mass effect or midline shift noted. No extra-axial fluid collection. Vascular: No hyperdense vessel or unexpected calcification. Skull: Normal. Negative for fracture  or focal lesion. Sinuses/Orbits: No acute finding. Other: Partially visualized support tubes in the mouth. IMPRESSION: No acute intracranial hemorrhage. Mild chronic microvascular ischemic changes. If symptoms persist and there are no contraindications, MRI may provide better evaluation if clinically indicated. Electronically Signed   By: Anner Crete M.D.   On: 10/03/2016 01:28   Ct Angio Chest Pe W Or Wo Contrast  Result Date: 10/14/2016 CLINICAL DATA:  Hypoxia, agitation, SVT, elevated D-dimer. EXAM: CT ANGIOGRAPHY CHEST WITH CONTRAST TECHNIQUE: Multidetector CT imaging of the chest was performed using the standard protocol during bolus administration of intravenous contrast. Multiplanar CT image reconstructions and MIPs were obtained to evaluate the vascular anatomy. CONTRAST:  100 cc Isovue 370 COMPARISON:  None. FINDINGS: Cardiovascular: Beam hardening artifact, presumably from patient's arms, considerably limits characterization of the peripheral segmental and subsegmental pulmonary artery branches to the lower lobes bilaterally. I cannot exclude small peripheral pulmonary embolism. There is no pulmonary embolism identified within the main, lobar or central segmental pulmonary arteries bilaterally. Heart size is normal. No pericardial effusion. No aortic aneurysm or dissection. Mild atherosclerotic changes noted along the walls of the descending  thoracic aorta. Coronary artery calcifications noted. Mediastinum/Nodes: No mass or enlarged lymph nodes within the mediastinum or perihilar regions. Esophagus appears normal. Trachea and central bronchi are unremarkable. Lungs/Pleura: Small right pleural effusion with adjacent compressive atelectasis. Mild atelectasis at the left lung base. Hazy ground-glass opacities within the upper lobes bilaterally, likely additional atelectasis or perhaps mild edema. No pneumothorax. 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 Upper Abdomen: Free intraperitoneal air. Small amount of free fluid in the left upper quadrant. Mass versus fluid collection adjacent to the pancreatic tail, incompletely imaged. Multiple gallstones within the gallbladder, incompletely imaged. Musculoskeletal: No acute or suspicious osseous lesion. Mild degenerative spurring within the thoracic spine. Superficial soft tissues are unremarkable. Review of the MIP images confirms the above findings. IMPRESSION: 1. Free intraperitoneal air within the upper abdomen. In the absence of a recent surgery, this suggests viscus perforation. CT abdomen and pelvis is recommended for further characterization. 2. No pulmonary embolism seen, with study limitations detailed above. 3. Small right pleural effusion. Mild bibasilar atelectasis. Mild atelectasis versus edema within the upper lobes bilaterally. No evidence of pneumonia. 4. Free fluid in the left upper abdomen. Mass versus fluid collection adjacent to the pancreatic tail, incompletely imaged. Cholelithiasis, incompletely imaged. 5. Aortic atherosclerosis. Critical Value/emergent results were called by telephone at the time of interpretation on 10/14/2016 at 4:25 pm to Dr. Irine Seal , who verbally acknowledged these results. Electronically Signed   By: Franki Cabot M.D.   On: 10/14/2016 16:29   Ct Abdomen Pelvis W Contrast  Addendum Date: 10/14/2016   ADDENDUM REPORT:  10/14/2016 21:30 ADDENDUM: Findings discussed with K. Schorr 10/14/2016  at 9:30 pm. Electronically Signed   By: Monte Fantasia M.D.   On: 10/14/2016 21:30   Result Date: 10/14/2016 CLINICAL DATA:  Follow-up chest CT.  Free air. EXAM: CT ABDOMEN AND PELVIS WITH CONTRAST TECHNIQUE: Multidetector CT imaging of the abdomen and pelvis was performed using the standard protocol following bolus administration of intravenous contrast. CONTRAST:  39mL ISOVUE-300 IOPAMIDOL (ISOVUE-300) INJECTION 61% COMPARISON:  None. FINDINGS: Lower chest: Described separately. Small pleural effusions and atelectasis Hepatobiliary: Low-density along the falciform ligament is likely perfusion anomaly. Cannot exclude cirrhosis. The liver surface is lobulated. Cholelithiasis. No signs of acute cholecystitis. Pancreas: Cystic density of the pancreatic tail measuring 26 mm. No surrounding inflammatory changes. There is  a coarse calcification and neighboring pancreatic tail which is somewhat full. Some this fullness may be related to venous collaterals. Spleen: Enlarged this 17 cm span. This is likely related to chronic splenic vein occlusion. There are well-formed venous collaterals, including proximal gastric varices. Adrenals/Urinary Tract: Negative adrenals. No hydronephrosis or stone. Urinary bladder predominately decompressed by Foley catheter. Stomach/Bowel: Numerous colonic diverticula. There is question of diverticulitis at the splenic flexure, but no noted gas in this region to explain pneumoperitoneum. No inflammatory changes noted around the stomach or proximal duodenum. No visible ulcer. Proximal gastric varices. Negative appendix. Vascular/Lymphatic: No acute vascular abnormality. Splenic vein occlusion as described. Extensive atherosclerosis. No mass or adenopathy. Reproductive:No pathologic findings. Other: Moderate volume pneumoperitoneum, maximal in the upper ventral abdomen, but also present in the left lower quadrant.  Musculoskeletal:  Degenerative changes.  No acute abnormalities. Findings are known based on previous recorded communications. Mid level has been paged to make aware of the completed scan. IMPRESSION: 1. No definitive source for the patient's moderate pneumoperitoneum. Given there is no inflammatory changes or visible ulcer along the stomach or proximal duodenum, a diverticular source is favored. There may be mild diverticulitis along the splenic flexure, but no extraluminal gas in this region. 2. 26 mm fluid collection in the splenic fossa. Suspect pseudocyst as there is ductal or parenchymal calcification in the pancreatic tail. Pancreatic tail is full and pancreas protocol CT or MRI follow-up is recommended after convalescence to exclude mass. 3. 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 Patient was admitted with GI bleeding. 4. Cholelithiasis. 5. Suspected cirrhosis. 6. Splenomegaly. Electronically Signed: By: Monte Fantasia M.D. On: 10/14/2016 21:19   Dg Chest Port 1 View  Result Date: 10/11/2016 CLINICAL DATA:  Respiratory failure, GI bleeding, encephalopathy, current smoker. EXAM: PORTABLE CHEST 1 VIEW COMPARISON:  Portable chest x-ray of October 07, 2016 FINDINGS: The lungs are well-expanded. The interstitial markings remain increased. The hemidiaphragms are now all well demonstrated. Basilar parenchymal density has markedly improved. The heart remains mildly enlarged. The central pulmonary vascularity remains engorged. The trachea is midline. The bony thorax exhibits no acute abnormality. IMPRESSION: Improved bibasilar atelectasis or pneumonia. Persistent pulmonary interstitial edema and pulmonary vascular congestion consistent with CHF. Electronically Signed   By: David  Martinique M.D.   On: 10/11/2016 07:06   Dg Chest Port 1 View  Result Date: 10/07/2016 CLINICAL DATA:  Acute respiratory failure EXAM: PORTABLE CHEST 1 VIEW COMPARISON:  10/06/2016 FINDINGS: Support  devices are unchanged. Mild cardiomegaly. Bilateral airspace opacities and probable layering effusions. Overall aeration may have decreased slightly since prior study. IMPRESSION: Bilateral airspace disease and layering effusions, slightly worsened since prior study. Electronically Signed   By: Rolm Baptise M.D.   On: 10/07/2016 07:20   Dg Chest Port 1 View  Result Date: 10/06/2016 CLINICAL DATA:  Endotracheal tube EXAM: PORTABLE CHEST 1 VIEW COMPARISON:  10/05/2016 FINDINGS: Endotracheal tube 2 cm above the carina. Right jugular central venous catheter tip in the SVC. Feeding tube in place with the tip not visualized Bibasilar airspace disease similar to yesterday. Small right effusion. No pneumothorax IMPRESSION: Endotracheal tube 2 cm above the carina Bibasilar airspace disease unchanged from the prior study. Electronically Signed   By: Franchot Gallo M.D.   On: 10/06/2016 07:08   Dg Chest Port 1 View  Result Date: 10/05/2016 CLINICAL DATA:  Check endotracheal tube EXAM: PORTABLE CHEST 1 VIEW COMPARISON:  10/04/2016 FINDINGS: Endotracheal tube and right jugular central line are again seen and stable.  Cardiac shadow is within normal limits. Patients significant rotated to the right accentuating the mediastinal markings. Small bilateral pleural effusions are noted. No other focal infiltrate is seen. IMPRESSION: Endotracheal tube in satisfactory position. Small bilateral pleural effusions are noted. Electronically Signed   By: Inez Catalina M.D.   On: 10/05/2016 08:44   Dg Chest Port 1 View  Result Date: 10/04/2016 CLINICAL DATA:  Post central line placement EXAM: PORTABLE CHEST 1 VIEW COMPARISON:  Portable exam 1232 hours compared to 10/07/2016 at 0524 hours FINDINGS: Tip of endotracheal tube projects 7.0 cm above carina. New RIGHT jugular central venous catheter with tip projecting over SVC. Stable heart size and mediastinal contours. Bronchitic changes and accentuated perihilar markings stable.  Bibasilar atelectasis. LEFT lateral costophrenic angle excluded. No new infiltrate or pneumothorax. Bones demineralized. IMPRESSION: No pneumothorax following RIGHT jugular line placement. Bronchitic changes with bibasilar atelectasis. Electronically Signed   By: Lavonia Dana M.D.   On: 10/04/2016 12:48   Dg Chest Port 1 View  Result Date: 10/04/2016 CLINICAL DATA:  Intubation. EXAM: PORTABLE CHEST 1 VIEW COMPARISON:  10/03/2016. FINDINGS: Endotracheal tube in stable position. Stable cardiomegaly. No pulmonary venous congestion. Progressive dense right lower lobe atelectasis. Mild left base subsegmental atelectasis. Small bilateral pleural effusions cannot be excluded. IMPRESSION: 1. Endotracheal tube in stable position. 2. Low lung volumes. Progressive dense right lower lobe atelectasis. Improved aeration of the left base with mild residual infiltrate/edema. 3. Small bilateral pleural effusions . Electronically Signed   By: Marcello Moores  Register   On: 10/04/2016 07:09   Dg Chest Port 1 View  Result Date: 10/03/2016 CLINICAL DATA:  Hypoxia, intubated, smoker EXAM: PORTABLE CHEST 1 VIEW COMPARISON:  Portable exam 0901 hours compared to 10/02/2016 FINDINGS: Tip of endotracheal tube projects 3.3 cm above carina. Enlargement of cardiac silhouette. Mediastinal contours and pulmonary vascularity normal. Persistent LEFT lower lobe infiltrate question pneumonia. Central peribronchial thickening with question minimal atelectasis or infiltrate at RIGHT base. Upper lungs clear. No definite pleural effusion or pneumothorax. IMPRESSION: Persistent LEFT lower lobe infiltrate with question minimal atelectasis versus infiltrate RIGHT base. Electronically Signed   By: Lavonia Dana M.D.   On: 10/03/2016 09:11   Portable Chest Xray  Result Date: 10/02/2016 CLINICAL DATA:  Hypoxia EXAM: PORTABLE CHEST 1 VIEW COMPARISON:  Chest CT October 01, 2016 FINDINGS: Endotracheal tube tip is 2.5 cm above the carina. Nasogastric tube tip and  side port are below the diaphragm. No pneumothorax. There is patchy consolidation in the left lower lobe. Lungs elsewhere are clear. There is mild cardiomegaly with pulmonary venous hypertension. No evident adenopathy. No bone lesions. IMPRESSION: Tube positions as described without pneumothorax. Left lower lobe consolidation, likely pneumonia. There is cardiomegaly with pulmonary venous hypertension indicative of a degree of pulmonary vascular congestion. Electronically Signed   By: Lowella Grip III M.D.   On: 10/02/2016 17:30   Dg Chest Port 1v Same Day  Result Date: 10/04/2016 CLINICAL DATA:  Evaluate ET tube placement. EXAM: PORTABLE CHEST 1 VIEW COMPARISON:  Chest radiograph earlier same day. FINDINGS: ET tube terminates in the mid to distal trachea. Monitoring leads overlie the patient. Right IJ central venous catheter tip projects over the superior vena cava. Stable cardiac and mediastinal contours, enlarged. Grossly unchanged mid lower lung airspace opacities. Probable small bilateral layering pleural effusions. IMPRESSION: ET tube terminates in the mid to distal trachea. Electronically Signed   By: Lovey Newcomer M.D.   On: 10/04/2016 20:24   Dg Chest Port 1v Same Day  Result  Date: 10/04/2016 CLINICAL DATA:  Patient status post intubation. EXAM: PORTABLE CHEST 1 VIEW COMPARISON:  Chest radiograph 10/04/2016. FINDINGS: Right IJ central venous catheter tip projects over the superior vena cava. The ET tube is visualized to the distal trachea. The distal aspect of the ET tube is not well demonstrated. Monitoring leads overlie the patient. Stable enlarged cardiac and mediastinal contours. Grossly unchanged bilateral mid and lower lung, right-greater-than-left heterogeneous opacities. Possible small layering bilateral pleural effusions. IMPRESSION: ET tube is visualized to the distal trachea. Given technique, the distal aspect of the ET tube is not demonstrated. Recommend repeat evaluation. Re-  demonstrated bilateral mid and lower lung heterogeneous opacities. These results will be called to the ordering clinician or representative by the Radiologist Assistant, and communication documented in the PACS or zVision Dashboard. Electronically Signed   By: Lovey Newcomer M.D.   On: 10/04/2016 18:53   Dg Abd Portable 1v  Result Date: 10/08/2016 CLINICAL DATA:  Feeding tube placement. EXAM: PORTABLE ABDOMEN - 1 VIEW COMPARISON:  10/05/2016 FINDINGS: The feeding tube tip is at the junction of the second and third portions of the duodenum. Air throughout the small bowel and colon suggesting an ileus. IMPRESSION: Feeding tube tip is in the distal descending duodenum. Electronically Signed   By: Marijo Sanes M.D.   On: 10/08/2016 12:53   Dg Abd Portable 1v  Result Date: 10/05/2016 CLINICAL DATA:  Feeding tube placement EXAM: PORTABLE ABDOMEN - 1 VIEW COMPARISON:  10/03/2016 FINDINGS: Feeding tube tip is in the descending duodenum. IMPRESSION: Feeding tube tip in the descending duodenum. Electronically Signed   By: Rolm Baptise M.D.   On: 10/05/2016 11:30   Dg Abd Portable 1v  Result Date: 10/03/2016 CLINICAL DATA:  Assess orogastric tube placement. EXAM: PORTABLE ABDOMEN - 1 VIEW COMPARISON:  Abdominal radiograph of October 02, 2016 FINDINGS: The orogastric tube tip lies in the mid gastric body. The proximal port lies just below the GE junction. The observed bowel gas pattern is unremarkable. IMPRESSION: Positioning of the orogastric tube since at the proximal port is just below the expected location of the GE junction. Advancement by 5-10 cm is recommended to assure that the proximal port remains below the GE junction. Electronically Signed   By: David  Martinique M.D.   On: 10/03/2016 07:57   Dg Abd Portable 1v  Result Date: 10/02/2016 CLINICAL DATA:  Nasogastric tube placement EXAM: PORTABLE ABDOMEN - 1 VIEW COMPARISON:  None. FINDINGS: Nasogastric tube tip and side port are in the stomach. There is no  bowel dilatation or air-fluid level suggesting bowel obstruction. No free air. IMPRESSION: Nasogastric tube tip and side port in stomach. Bowel gas pattern unremarkable. Electronically Signed   By: Lowella Grip III M.D.   On: 10/02/2016 17:28   Dg Swallowing Func-speech Pathology  Result Date: 10/20/2016 Objective Swallowing Evaluation: Type of Study: MBS-Modified Barium Swallow Study Patient Details Name: Yoshiro Eichorst MRN: [id number] Date of Birth: 17-Nov-1950 Today's Date: 10/20/2016 Time: SLP Start Time (ACUTE ONLY): 1110-SLP Stop Time (ACUTE ONLY): 1125 SLP Time Calculation (min) (ACUTE ONLY): 15 min Past Medical History: Past Medical History: Diagnosis Date . Muscle spasm  Past Surgical History: Past Surgical History: Procedure Laterality Date . ELBOW SURGERY   . ESOPHAGOGASTRODUODENOSCOPY N/A 10/04/2016  Procedure: ESOPHAGOGASTRODUODENOSCOPY (EGD);  Surgeon: Carol Ada, MD;  Location: [facility];  Service: Gastroenterology;  Laterality: N/A; HPI: 66 year old male admitted 10/01/16 due to near syncope, AMS, acute encephalopathy and intermittent hypoxia. Pt intubated 1/24-28/18 due to respiratory distress.  UGI 10/04/16 - hypertensive gastropathy vs inflammatory gastritis. CXR 10/07/16 - bilateral airspace disease. PMH unremarkable. No Data Recorded Assessment / Plan / Recommendation CHL IP CLINICAL IMPRESSIONS 10/20/2016 Therapy Diagnosis -- Clinical Impression Pt's swallow ability has improved from previous MBS. Mild sensorimotor (motor>sensory) oral and pharyngeal dysphagia. Laryngeal penetration present with thin, mostly flash although minimal amount remained in vestibule following large and consecutive sips thin in which penetration occured before the swallow. Delayed oral transit (thin) and prolonged mastication with solid texture. Reduced tongue base retraction and decreased laryngeal elevation led to mild valleculae (mostly) and pyriform sinus residue. Verbal cues for second swallow cleared majority of  residue. Smaller cup sips mitigated occurance of penetration. Recommend he continue with Dys 2 texture however upgrade liquids to thin, small sips, straws allowed and swallow twice after bites/sips.    Impact on safety and function --   CHL IP TREATMENT RECOMMENDATION 10/19/2016 Treatment Recommendations Therapy as outlined in treatment plan below   Prognosis 10/19/2016 Prognosis for Safe Diet Advancement Good Barriers to Reach Goals Cognitive deficits Barriers/Prognosis Comment -- CHL IP DIET RECOMMENDATION 10/19/2016 SLP Diet Recommendations Dysphagia 2 (Fine chop) solids;Thin liquid Liquid Administration via Cup;No straw Medication Administration Whole meds with puree Compensations Slow rate;Minimize environmental distractions;Small sips/bites Postural Changes Seated upright at 90 degrees   CHL IP OTHER RECOMMENDATIONS 10/19/2016 Recommended Consults -- Oral Care Recommendations Oral care BID Other Recommendations --   CHL IP FOLLOW UP RECOMMENDATIONS 10/19/2016 Follow up Recommendations Other (comment)   CHL IP FREQUENCY AND DURATION 10/19/2016 Speech Therapy Frequency (ACUTE ONLY) min 2x/week Treatment Duration 2 weeks      CHL IP ORAL PHASE 10/19/2016 Oral Phase Impaired Oral - Pudding Teaspoon -- Oral - Pudding Cup -- Oral - Honey Teaspoon -- Oral - Honey Cup -- Oral - Nectar Teaspoon -- Oral - Nectar Cup -- Oral - Nectar Straw NT Oral - Thin Teaspoon -- Oral - Thin Cup WFL Oral - Thin Straw WFL Oral - Puree NT Oral - Mech Soft NT Oral - Regular Lingual/palatal residue Oral - Multi-Consistency -- Oral - Pill -- Oral Phase - Comment --  CHL IP PHARYNGEAL PHASE 10/19/2016 Pharyngeal Phase Impaired Pharyngeal- Pudding Teaspoon -- Pharyngeal -- Pharyngeal- Pudding Cup -- Pharyngeal -- Pharyngeal- Honey Teaspoon -- Pharyngeal -- Pharyngeal- Honey Cup -- Pharyngeal -- Pharyngeal- Nectar Teaspoon -- Pharyngeal -- Pharyngeal- Nectar Cup NT Pharyngeal -- Pharyngeal- Nectar Straw NT Pharyngeal -- Pharyngeal- Thin Teaspoon --  Pharyngeal -- Pharyngeal- Thin Cup Penetration/Aspiration during swallow;Penetration/Aspiration before swallow;Pharyngeal residue - valleculae;Pharyngeal residue - pyriform;Reduced laryngeal elevation;Reduced tongue base retraction;Delayed swallow initiation-pyriform sinuses Pharyngeal Material enters airway, remains ABOVE vocal cords then ejected out;Material enters airway, remains ABOVE vocal cords and not ejected out Pharyngeal- Thin Straw Delayed swallow initiation-pyriform sinuses;Pharyngeal residue - valleculae;Pharyngeal residue - pyriform;Reduced tongue base retraction;Reduced laryngeal elevation Pharyngeal Material does not enter airway Pharyngeal- Puree NT Pharyngeal -- Pharyngeal- Mechanical Soft NT Pharyngeal -- Pharyngeal- Regular WFL Pharyngeal -- Pharyngeal- Multi-consistency -- Pharyngeal -- Pharyngeal- Pill -- Pharyngeal -- Pharyngeal Comment --  CHL IP CERVICAL ESOPHAGEAL PHASE 10/19/2016 Cervical Esophageal Phase (No Data) Pudding Teaspoon -- Pudding Cup -- Honey Teaspoon -- Honey Cup -- Nectar Teaspoon -- Nectar Cup -- Nectar Straw -- Thin Teaspoon -- Thin Cup -- Thin Straw -- Puree -- Mechanical Soft -- Regular -- Multi-consistency -- Pill -- Cervical Esophageal Comment -- No flowsheet data found. [name] 10/20/2016, 2:34 PM [name].Ed [employer] [phone]  Dg Swallowing Func-speech Pathology  Result Date: 10/17/2016 Objective Swallowing Evaluation: Type of Study: MBS-Modified Barium Swallow Study Patient Details Name: Lori Donkor MRN: [id number] Date of Birth: [birth year] Today's Date: 10/17/2016 Time: SLP Start Time (ACUTE ONLY): 1145-SLP Stop Time (ACUTE ONLY): 1202 SLP Time Calculation (min) (ACUTE ONLY): 17 min Past Medical History: Past Medical History: Diagnosis Date . Muscle spasm  Past Surgical History: Past Surgical History: Procedure Laterality Date . ELBOW SURGERY   . ESOPHAGOGASTRODUODENOSCOPY N/A 10/04/2016  Procedure: ESOPHAGOGASTRODUODENOSCOPY  (EGD);  Surgeon: Carol Ada, MD;  Location: [facility];  Service: Gastroenterology;  Laterality: N/A; HPI: 66 year old male admitted 10/01/16 due to near syncope, AMS, acute encephalopathy and intermittent hypoxia. Pt intubated 1/24-28/18 due to respiratory distress. UGI 10/04/16 - hypertensive gastropathy vs inflammatory gastritis. CXR 10/07/16 - bilateral airspace disease. PMH unremarkable. No Data Recorded Assessment / Plan / Recommendation CHL IP CLINICAL IMPRESSIONS 10/17/2016 Therapy Diagnosis Mild pharyngeal phase dysphagia;Mild oral phase dysphagia Clinical Impression Pt demonstrates a mild oropharyngeal dysphagia, likely secondary to sluggish motor control and lethargy associated with this acute illness. Pt difficult to arouse for MBS, continues to be dysarthric and impulsive with self feeding. Primary problem is impulsive, overlarge intake of cup and straw sips of thin liquids leading to premature spillage into the vestibule and delayed swallow initiation with penetration before the swallow. Penetration events are silent, but clear with a cued throat clear. Pts dentures were not present for this test, but mastication of solids was low, requiring verbal cues. Recommend pt continue current diet recommendation of nectar thick liquids and dys 2(fine chopped solids) until mentation improves and arousal is consistent. Pt should not remain on nectar thick liquids long term as dysphagia is relatively mild. Will discuss with pts daughter.  Impact on safety and function Mild aspiration risk   CHL IP TREATMENT RECOMMENDATION 10/17/2016 Treatment Recommendations Therapy as outlined in treatment plan below   Prognosis 10/17/2016 Prognosis for Safe Diet Advancement Good Barriers to Reach Goals Cognitive deficits Barriers/Prognosis Comment -- CHL IP DIET RECOMMENDATION 10/17/2016 SLP Diet Recommendations Dysphagia 2 (Fine chop) solids;Nectar thick liquid Liquid Administration via Cup;Straw Medication Administration Whole meds  with puree Compensations Slow rate;Small sips/bites Postural Changes Seated upright at 90 degrees   CHL IP OTHER RECOMMENDATIONS 10/17/2016 Recommended Consults -- Oral Care Recommendations Oral care BID Other Recommendations Order thickener from pharmacy   CHL IP FOLLOW UP RECOMMENDATIONS 10/17/2016 Follow up Recommendations Skilled Nursing facility   [facility] IP FREQUENCY AND DURATION 10/17/2016 Speech Therapy Frequency (ACUTE ONLY) min 2x/week Treatment Duration 2 weeks      CHL IP ORAL PHASE 10/17/2016 Oral Phase Impaired Oral - Pudding Teaspoon -- Oral - Pudding Cup -- Oral - Honey Teaspoon -- Oral - Honey Cup -- Oral - Nectar Teaspoon -- Oral - Nectar Cup -- Oral - Nectar Straw WFL Oral - Thin Teaspoon -- Oral - Thin Cup Premature spillage Oral - Thin Straw Premature spillage Oral - Puree Decreased bolus cohesion;Delayed oral transit Oral - Mech Soft Decreased bolus cohesion;Delayed oral transit;Lingual/palatal residue;Impaired mastication Oral - Regular -- Oral - Multi-Consistency -- Oral - Pill -- Oral Phase - Comment --  CHL IP PHARYNGEAL PHASE 10/17/2016 Pharyngeal Phase Impaired Pharyngeal- Pudding Teaspoon -- Pharyngeal -- Pharyngeal- Pudding Cup -- Pharyngeal -- Pharyngeal- Honey Teaspoon -- Pharyngeal -- Pharyngeal- Honey Cup -- Pharyngeal -- Pharyngeal- Nectar Teaspoon -- Pharyngeal -- Pharyngeal- Nectar Cup WFL Pharyngeal -- Pharyngeal- Nectar Straw WFL Pharyngeal -- Pharyngeal- Thin Teaspoon -- Pharyngeal -- Pharyngeal- Thin Cup Delayed swallow initiation-pyriform  sinuses;Penetration/Aspiration before swallow Pharyngeal Material enters airway, remains ABOVE vocal cords then ejected out;Material does not enter airway;Material enters airway, CONTACTS cords and then ejected out Pharyngeal- Thin Straw Delayed swallow initiation-pyriform sinuses;Penetration/Aspiration before swallow Pharyngeal Material enters airway, CONTACTS cords and not ejected out Pharyngeal- Puree WFL Pharyngeal -- Pharyngeal- Mechanical Soft WFL  Pharyngeal -- Pharyngeal- Regular -- Pharyngeal -- Pharyngeal- Multi-consistency -- Pharyngeal -- Pharyngeal- Pill -- Pharyngeal -- Pharyngeal Comment --  No flowsheet data found. No flowsheet data found. Herbie [place], [place] CCC-SLP [phone] Othelia Pulling Katherene Ponto 10/17/2016, 2:07 PM              Ct Angio Chest/abd/pel For Dissection W And/or Wo Contrast  Result Date: 10/01/2016 CLINICAL DATA:  66 year old male with left-sided flank pain, concerning for dissection. EXAM: CT ANGIOGRAPHY CHEST, ABDOMEN AND PELVIS TECHNIQUE: Multidetector CT imaging through the chest, abdomen and pelvis was performed using the standard protocol during bolus administration of intravenous contrast. Multiplanar reconstructed images including MIPs were obtained and reviewed to evaluate the vascular anatomy. CONTRAST:  100 cc Isovue 370 COMPARISON:  None. FINDINGS: CTA CHEST FINDINGS Cardiovascular: Heart: No cardiomegaly. No pericardial fluid/thickening. Calcifications of the left main, left anterior descending, circumflex coronary arteries. Aorta: Unremarkable course, caliber, contour of the thoracic aorta. No aneurysm or dissection flap. No periaortic fluid. Soft plaque of the aortic arch with minimal calcifications. Mixed atherosclerotic and soft plaque of the descending thoracic aorta. Pulmonary arteries: No central, lobar, segmental, or proximal subsegmental filling defects. Mediastinum/Nodes: Mediastinal lymph nodes are present, none of which are enlarged by CT size criteria. Unremarkable appearance of the thoracic esophagus. Unremarkable appearance of the thoracic inlet and thyroid. Lungs/Pleura: No pneumothorax or pleural effusion. Centrilobular emphysema with minimal paraseptal emphysema at the lung apices. No confluent airspace disease. No significant bronchial wall thickening. No pneumothorax. Musculoskeletal: No displaced fracture. Degenerative changes of the thoracic spine. Review of the MIP images confirms the above  findings. CTA ABDOMEN AND PELVIS FINDINGS VASCULAR Aorta: Mixed calcified and soft plaque of the abdominal aorta. No aneurysm. No periaortic fluid. No dissection. Celiac: Typical branch pattern of the celiac artery, with left gastric, common hepatic, and splenic artery identified. SMA: No significant atherosclerotic changes of the superior mesenteric artery. Renals: Renal arteries are patent. Single left and single right renal artery. IMA: Inferior mesenteric artery is patent. Left colic artery patent. Superior rectal artery patent. Right lower extremity: Unremarkable course, caliber, and contour of the right iliac system. No aneurysm, dissection, or occlusion. Hypogastric artery is patent. Anterior and posterior division patent. Common femoral artery patent. Proximal SFA and profunda femoris patent. Left lower extremity: Unremarkable course, caliber, and contour of the left iliac system. No aneurysm, dissection, or occlusion. Hypogastric artery is patent. Anterior and posterior division patent. Common femoral artery patent. Proximal SFA and profunda femoris patent. Veins: Splenic vein appears attenuated posterior to the body of the pancreas, though not well evaluated secondary to the phase of the contrast. Portal vein not well evaluated by the phase of the contrast bolus. Review of the MIP images confirms the above findings. NON-VASCULAR Hepatobiliary: Unremarkable appearance of liver. The gallbladder demonstrates lumen full of non radiopaque stones. Pericholecystic fluid is present with questionable gallbladder wall thickening. No intrahepatic or extrahepatic biliary ductal dilatation. Pancreas: No inflammatory changes at the head of the pancreas. Body the pancreas relatively unremarkable. Coarse calcification the tail of the pancreas. Focal fluid at the tail the pancreas extending towards the inferior pole of the spleen and adjacent to the splenic flexure, within the  splenic colic ligament. No pancreatic ductal  dilation. Spleen: Fluid and inflammatory changes adjacent to the inferior tip of the spleen. Adrenals/Urinary Tract: Unremarkable appearance of adrenal glands. No evidence of right-sided or left-sided hydronephrosis. Likely nonobstructive nephrolithiasis bilateral kidneys. Course the bilateral ureters unremarkable. Unremarkable appearance of the urinary bladder. Stomach/Bowel: Inflammatory changes adjacent to the splenic flexure of the colon. Circumferential wall thickening of the distal transverse colon and sigmoid colon. Small fat focus on the lateral sigmoid colon (image 170 series 6). Extensive colonic diverticula of the sigmoid colon, descending colon, and the distal transverse colon. Unremarkable stomach. Unremarkable small bowel with no abnormal distention. Normal appendix. Lymphatic: Lymph nodes within the preaortic and para-aortic nodal station. Mesenteric: Inflammatory changes within the left upper quadrant adjacent to the spleen. Inflammatory changes of the fat adjacent the gallbladder extending into the hilum of the liver surrounding portal venous structures. No free fluid. Reproductive: Unremarkable appearance of the pelvic organs. Other: No hernia. Musculoskeletal: No acute fracture identified. Multilevel degenerative changes of the lower lumbar spine worst at the L2-L3 level and L5-S1 level. Bilateral facet disease worst in the lower lumbar spine. Degenerative changes bilateral hips. Review of the MIP images confirms the above findings. IMPRESSION: Study is negative for findings of acute aortic syndrome. Significant inflammatory changes within the left upper quadrant at the tail the pancreas and splenic flexure of the colon. There is apparent circumferential colon wall thickening, and small volume of free fluid. Differential diagnosis includes distal pancreatitis, diverticulitis/epiploic appendagitis, focal ischemia, or potentially sequela of portal hypertension/sinistral portal hypertension.  Considered much less likely would be occult colon cancer with perforation. Cholelithiasis with circumferential fluid/gallbladder wall thickening. If there is concern for acute cholecystitis, correlation with lab values and potentially HIDA scan may be considered. Inflammatory changes adjacent to the gallbladder extend towards the liver hilum surrounding portal venous structures, and there is small caliber splenic vein posterior to the pancreas. Portal vein not well evaluated given the absence of a delayed CT phase. If there is concern for portal vein abnormality/thrombus, repeat contrast-enhanced CT with 8 delayed portal venous phase may be considered. The above results were called by telephone at the time of interpretation on 10/01/2016 at 9:30 pm to Dr. Ovid Curd PAGE , who verbally acknowledged these results. Aortic atherosclerosis without aneurysm. Coarse calcification the tail of the pancreas, potentially representing chronic pancreatitis/prior pancreatitis. Centrilobular emphysema with minimal paraseptal emphysema. Coronary artery disease. Signed, Dulcy Fanny. Earleen Newport, DO [facility] Radiology Electronically Signed   By: Corrie Mckusick D.O.   On: 10/01/2016 21:34    Microbiology: No results found for this or any previous visit (from the past 240 hour(s)).   Labs: Basic Metabolic Panel:  Recent Labs Lab 10/15/16 0213 10/16/16 1255 10/17/16 0706 10/18/16 0454  NA 140 140 140 139  K 3.3* 3.6 3.6 3.2*  CL 108 107 104 104  CO2 22 22 24  21*  GLUCOSE 111* 103* 95 129*  BUN 7 8 7 8   CREATININE 0.89 0.95 0.95 0.92  CALCIUM 8.8* 9.2 9.2 9.2  MG 2.0 1.9  --   --   PHOS 3.2  --   --   --    Liver Function Tests: No results for input(s): AST, ALT, ALKPHOS, BILITOT, PROT, ALBUMIN in the last 168 hours. No results for input(s): LIPASE, AMYLASE in the last 168 hours. No results for input(s): AMMONIA in the last 168 hours. CBC:  Recent Labs Lab  10/15/16 0213 10/16/16 1255 10/17/16 0706 10/18/16 0454  WBC 6.5 7.3 5.6 6.7  HGB 10.1* 10.7* 9.8* 9.6*  HCT 33.5* 35.1* 32.4* 31.4*  MCV 80.9 81.6 81.4 80.5  PLT 191 222 203 220   Cardiac Enzymes:  Recent Labs Lab 10/14/16 1220  TROPONINI <0.03   BNP: BNP (last 3 results) No results for input(s): BNP in the last 8760 hours.  ProBNP (last 3 results) No results for input(s): PROBNP in the last 8760 hours.  CBG:  Recent Labs Lab 10/20/16 0018 10/20/16 0420 10/20/16 0716 10/20/16 1139 10/20/16 1556  GLUCAP 122* 107* 135* 106* 147*       SignedDomenic Polite MD.  [facility] 10/21/2016, 11:22 AM

## 2016-10-19 NOTE — Progress Notes (Signed)
Speech Language Pathology Treatment: Dysphagia  Patient Details Name: Barry Mccoy MRN: [id number] DOB: [birth year] Today's Date: 10/19/2016 Time: [id number] SLP Time Calculation (min) (ACUTE ONLY): 15 min  Assessment / Plan / Recommendation Clinical Impression  Mr. Greenhut was impulsive and confused today, requiring moderate verbal cues and reminders to slow rate and take small sips/bites; pt's daughter was in room and also reminded him. He was observed with nectar thick liquids, regular solids, and thin liquids (despite being a silent penetrator). Pt exhibited delayed coughing and throat clearing with the thin liquids and was impulsive (taking big sips) throughout the treatment session. SLP advised that a repeat MBS be completed to assess Mr. Week' current swallowing function and for a possible diet upgrade. Educated pt and daughter re: diet recommendations and compensatory strategies. Recommend continuing Dys 2 solids, nectar thick liquids, whole meds with puree, repeat MBS (tomorrow).    HPI HPI: 66 year old male admitted 10/01/16 due to near syncope, AMS, acute encephalopathy and intermittent hypoxia. Pt intubated 1/24-28/18 due to respiratory distress. UGI 10/04/16 - hypertensive gastropathy vs inflammatory gastritis. CXR 10/07/16 - bilateral airspace disease. PMH unremarkable.      SLP Plan  Continue with current plan of care;MBS;Other (Comment) (MBS this weekend?)     Recommendations  Diet recommendations: Nectar-thick liquid;Dysphagia 2 (fine chop) Liquids provided via: Cup Medication Administration: Whole meds with puree Supervision: Staff to assist with self feeding;Full supervision/cueing for compensatory strategies Compensations: Slow rate;Minimize environmental distractions;Small sips/bites Postural Changes and/or Swallow Maneuvers: Seated upright 90 degrees                Oral Care Recommendations: Oral care BID Follow up Recommendations: Other (comment) (TBD) Plan: Continue  with current plan of care;MBS;Other (Comment) (MBS this weekend?)       [place] [facility] [place] 10/19/2016, 1:06 PM

## 2016-10-19 NOTE — Care Management Important Message (Signed)
Important Message  Patient Details  Name: Seabron Dillin MRN: [id number] Date of Birth: [birth year]   Medicare Important Message Given:  Yes    Shemicka Cohrs 10/19/2016, 3:25 PM

## 2016-10-19 NOTE — Clinical Social Work Note (Signed)
CSW met with pt and daughter Tara Baker. Provided daughter with local bed offers and asked for choice. Daughter stated she needs to consult with sister, Tricia, for local choice, but prefer [place] location.   CSW called [facility] and [facility] in [place]. CSW left voicemails for both. CSW to continue to follow.    , MSW, LCSWA  Clinical Social Worker  [phone] 

## 2016-10-20 ENCOUNTER — Inpatient Hospital Stay (HOSPITAL_COMMUNITY): Payer: Medicare Other

## 2016-10-20 LAB — GLUCOSE, CAPILLARY
GLUCOSE-CAPILLARY: 107 mg/dL — AB (ref 65–99)
GLUCOSE-CAPILLARY: 122 mg/dL — AB (ref 65–99)
Glucose-Capillary: 135 mg/dL — ABNORMAL HIGH (ref 65–99)
Glucose-Capillary: 106 mg/dL — ABNORMAL HIGH (ref 65–99)
Glucose-Capillary: 147 mg/dL — ABNORMAL HIGH (ref 65–99)

## 2016-10-20 MED ORDER — QUETIAPINE FUMARATE 50 MG PO TABS: 50.0000 mg | ORAL_TABLET | Freq: Every day | ORAL | Status: DC

## 2016-10-20 MED ORDER — HALOPERIDOL 1 MG PO TABS: 1.0000 mg | ORAL_TABLET | Freq: Three times a day (TID) | ORAL | 0 refills | Status: DC | PRN

## 2016-10-20 MED ORDER — POTASSIUM CHLORIDE 20 MEQ/15ML (10%) PO SOLN
60.0000 meq | Freq: Once | ORAL | Status: AC
  Administered 2016-10-20: 60 meq via ORAL
  Filled 2016-10-20: qty 45

## 2016-10-20 NOTE — Progress Notes (Signed)
Pt has walked around the dept with assistance multiple times today.  Pt has eaten 90% of his dinner with assistance of NT putting the food on the fork and then he is able to put it in his mouth.

## 2016-10-20 NOTE — Progress Notes (Addendum)
Pt seen and examined with RN, no changes from DC summary 2/9, cognitively with still some deficits and impulse control issues but continues to slowly improve. -in my professional opinion this patient has met maximal benefit from hospitalization and can be discharged to SNF. -plan for SNF, CSW following, will discharge with Foley catheter  Domenic Polite, MD

## 2016-10-20 NOTE — Progress Notes (Signed)
Spoke with Dr. Broadus John about transport to [facility] and Dr. Broadus John stated pt should have ambulance transport to the facility.

## 2016-10-20 NOTE — Progress Notes (Signed)
CM received call from [place] to help pt with Appeal.  After speaking with MD, CM understands family does not have an issue with disputing stability for discharge but has an issue with particular SNF.  Cm has made multiple calls to [facility] with no identifiers per HIPAA (as pt unable to articulate needs) and has left voicemail with [name] to please call me back but CM has not received callback.  Cm has called CSW Denyse Amass back and left voicemail but has not received callback.  CM has called [facility] to make aware.  IM was delivered on both 2/5 and again on 2/9.  No other CM  Needs were communicated.

## 2016-10-20 NOTE — Progress Notes (Signed)
    Barry Mccoy, is a 66 y.o. male, DOB - [birth year], [id number]  Patient was seen at the request of patient's family for a second opinion on discharge.   Went to see the patient, he was in bed having lunch, he is pleasantly confused but in no distress. He has been adequately treated for his medical issues which were respiratory failure due to aspiration pneumonia, alcohol withdrawal with continued delirium, Ur. Retention, Pneumoperitoneum and a few other issues.   He has been seen by pulmonary critical care, GI and neurology this admission. He is now medically stable, he is close to his baseline except for mild delirium, he is medically stable for discharge to a monitored setting like SNF for his delirium. Appropriate medications have been ordered by the discharging physician.  I discussed the plan of discharge and appropriateness of care with patient's daughter Barry Mccoy on 10/20/2016 at 2:15 PM. She is agreeable to discharge to SNF. Social work has been informed.   Vitals:   10/19/16 2056 10/20/16 0205 10/20/16 0531 10/20/16 1356  BP: (!) 102/51 108/61 114/63 128/85  Pulse: (!) 137 79 (!) 130 95  Resp:    20  Temp: 98.7 F (37.1 C) 98.4 F (36.9 C) 98 F (36.7 C) 98.2 F (36.8 C)  TempSrc: Oral Oral Oral Oral  SpO2: 98% 94% 98%   Weight:      Height:            Data Review   Micro Results  No results found for this or any previous visit (from the past 240 hour(s)).  Radiology Reports  CBC  Recent Labs Lab 10/14/16 0105 10/15/16 0213 10/16/16 1255 10/17/16 0706 10/18/16 0454  WBC 6.2 6.5 7.3 5.6 6.7  HGB 10.4* 10.1* 10.7* 9.8* 9.6*  HCT 34.1* 33.5* 35.1* 32.4* 31.4*  PLT 178 191 222 203 220  MCV 81.8 80.9 81.6 81.4 80.5  MCH 24.9* 24.4* 24.9* 24.6* 24.6*  MCHC 30.5 30.1 30.5 30.2 30.6  RDW 19.0* 19.3* 19.1* 18.9* 18.7*    Chemistries   Recent Labs Lab 10/14/16 0105  10/15/16 0213 10/16/16 1255 10/17/16 0706 10/18/16 0454  NA 139 140 140 140 139  K 3.6 3.3* 3.6 3.6 3.2*  CL 105 108 107 104 104  CO2 24 22 22 24  21*  GLUCOSE 116* 111* 103* 95 129*  BUN 7 7 8 7 8   CREATININE 0.83 0.89 0.95 0.95 0.92  CALCIUM 9.1 8.8* 9.2 9.2 9.2  MG 2.1 2.0 1.9  --   --    ------------------------------------------------------------------------------------------------------------------ estimated creatinine clearance is 91.8 mL/min (by C-G formula based on SCr of 0.92 mg/dL). ------------------------------------------------------------------------------------------------------------------ No results for input(s): HGBA1C in the last 72 hours. ------------------------------------------------------------------------------------------------------------------ No results for input(s): CHOL, HDL, LDLCALC, TRIG, CHOLHDL, LDLDIRECT in the last 72 hours. ------------------------------------------------------------------------------------------------------------------ No results for input(s): TSH, T4TOTAL, T3FREE, THYROIDAB in the last 72 hours.  Invalid input(s): FREET3 ------------------------------------------------------------------------------------------------------------------ No results for input(s): VITAMINB12, FOLATE, FERRITIN, TIBC, IRON, RETICCTPCT in the last 72 hours.  Coagulation profile No results for input(s): INR, PROTIME in the last 168 hours.  No results for input(s): DDIMER in the last 72 hours.  Cardiac Enzymes  Recent Labs Lab 10/14/16 0108 10/14/16 0635 10/14/16 1220  TROPONINI <0.03 <0.03 <0.03   ------------------------------------------------------------------------------------------------------------------ Invalid input(s): POCBNP

## 2016-10-20 NOTE — Progress Notes (Signed)
[name] (pt's daughter) called back and said that [facility] in [place], [place] is their preference.  She had talked to [place] there who works on [employer] side on the weekends.  She states that they have beds on both sides.  The # for Bartholome [facility] is [phone].  She also mentioned that her sister and her boyfriend have offered to transport him there via their truck if it is medically OK to do so.  I also gave Eyvonne Left Scinto's # to call and told her I would write all the information in the computer.

## 2016-10-20 NOTE — Clinical Social Work Note (Signed)
Clinical Social Worker continuing to follow patient and family for support and discharge planning needs.  CSW left voicemail for patient daughter, [name] at 15:17 to further discuss patient placement.  Await return call to provide local bed offers and facilitate patient discharge.  Patient is severe wandering risk, therefore will need to locate facility that is appropriate to meet patient need.  CSW remains available for support and to facilitate patient discharge needs.  Barbette Or, [place]

## 2016-10-20 NOTE — Progress Notes (Signed)
Modified Barium Swallow Progress Note  Patient Details  Name: Barry Mccoy MRN: [id number] Date of Birth: Jun 30, 1951  Today's Date: 10/20/2016  Modified Barium Swallow completed.  Full report located under Chart Review in the Imaging Section.  Brief recommendations include the following:  Clinical Impression  Pt's swallow ability has improved from previous MBS. Mild sensorimotor (motor>sensory) oral and pharyngeal dysphagia. Laryngeal penetration present with thin, mostly flash although minimal amount remained in vestibule following large and consecutive sips thin in which penetration occured before the swallow. Delayed oral transit (thin) and prolonged mastication with solid texture. Reduced tongue base retraction and decreased laryngeal elevation led to mild valleculae (mostly) and pyriform sinus residue. Verbal cues for second swallow cleared majority of residue. Smaller cup sips mitigated occurance of penetration. Recommend he continue with Dys 2 texture however upgrade liquids to thin, small sips, no straws and swallow twice after bites/sips.      Swallow Evaluation Recommendations  Dys 2 texture/thin liquids, no straws, pills whole in applesauce, swallow twice after bites/sips, cough intermittently                                  [name] 10/20/2016,2:35 PM   Orbie Pyo Jewell[employer] [phone]

## 2016-10-20 NOTE — Clinical Social Work Note (Signed)
Clinical Social Worker continuing to follow patient and family for discharge planning needs.  Patient daughter, [name], spoke with RN who documented patient family preference for [facility] in [place], [place].  CSW contacted facility who states that the social worker/admission coordinator will not be available until Monday to make any decisions.  Facility also informed CSW that there are no precautions for wanderers in their rehab department, only long term care.  CSW attempted once again to reach patient daughter and had to leave a message regarding patient discharge disposition.  CSW notified RNCM of patient discharge order and the need for appeal process with patient family.  CSW remains available to facilitate patient discharge needs.  Barbette Or, [place]

## 2016-10-21 MED ORDER — METOPROLOL TARTRATE 25 MG PO TABS
25.0000 mg | ORAL_TABLET | Freq: Two times a day (BID) | ORAL | Status: DC
  Administered 2016-10-21 – 2016-10-22 (×3): 25 mg via ORAL
  Filled 2016-10-21 (×3): qty 1

## 2016-10-21 MED ORDER — METOPROLOL TARTRATE 25 MG PO TABS: 25.0000 mg | ORAL_TABLET | Freq: Two times a day (BID) | ORAL | Status: DC

## 2016-10-21 NOTE — NC FL2 (Signed)
[place] LEVEL OF CARE SCREENING TOOL     IDENTIFICATION  Patient Name: Barry Mccoy Birthdate: [birth year] Sex: male Admission Date (Current Location): 10/01/2016  [facility] and [place] Number:  Herbalist and Address:  [facility] [place] [facility], [place] [address] [place], [place] [address]      Provider Number: [id number]  Attending Physician Name and Address:  Domenic Polite, MD  Relative Name and Phone Number:  Lorenda Hatchet (Daughter) [phone]    Current Level of Care: Hospital Recommended Level of Care: [place] Prior Approval Number:    Date Approved/Denied:   PASRR Number:   [id number]  Discharge Plan: SNF    Current Diagnoses: Patient Active Problem List   Diagnosis Date Noted  . Altered mental status   . Acute encephalopathy   . Positive D dimer   . Severe single current episode of major depressive disorder, with psychotic features ([place])   . Delirium   . Acute respiratory failure ([place])   . Symptomatic anemia 10/01/2016  . Acute GI bleeding 10/01/2016    Orientation RESPIRATION BLADDER Height & Weight     Self, Time  O2 (2L Nasal Cannula) Continent, External catheter Weight: 201 lb 8 oz (91.4 kg) Height:  6\' 2"  (188 cm)  BEHAVIORAL SYMPTOMS/MOOD NEUROLOGICAL BOWEL NUTRITION STATUS  Wanderer (Physically able to get up - cognitive impairment)   Incontinent (Rectal Pouch) Diet (Dysphagia 1 (Puree) thin liquid)  AMBULATORY STATUS COMMUNICATION OF NEEDS Skin   Extensive Assist Verbally Normal                       Personal Care Assistance Level of Assistance  Bathing, Dressing, Feeding Bathing Assistance: Maximum assistance Feeding assistance: Limited assistance Dressing Assistance: Maximum assistance     Functional Limitations Info  Sight, Hearing, Speech Sight Info: Adequate Hearing Info: Adequate Speech Info: Adequate    SPECIAL CARE FACTORS FREQUENCY  PT (By licensed PT), OT (By  licensed OT)                    Contractures Contractures Info: Not present    Additional Factors Info  Code Status, Allergies, Psychotropic Code Status Info: FULL Allergies Info: Shellfish Allergy Psychotropic Info: Klonopin; Seroquel; Zoloft; Valium; Haldol         Current Medications (10/21/2016):  This is the current hospital active medication list Current Facility-Administered Medications  Medication Dose Route Frequency Provider Last Rate Last Dose  . baclofen (LIORESAL) tablet 20 mg  20 mg Oral QHS Domenic Polite, MD   20 mg at 10/20/16 2143  . docusate (COLACE) 50 MG/5ML liquid 100 mg  100 mg Oral BID PRN Milagros Loll, MD      . feeding supplement (ENSURE ENLIVE) (ENSURE ENLIVE) liquid 237 mL  237 mL Oral BID BM Jose Angelo Radford Pax, MD   237 mL at 10/21/16 1000  . folic acid (FOLVITE) tablet 1 mg  1 mg Oral Daily Praveen Mannam, MD   1 mg at 10/21/16 0844  . haloperidol lactate (HALDOL) injection 1 mg  1 mg Intravenous Q6H PRN Domenic Polite, MD   1 mg at 10/18/16 1404  . MEDLINE mouth rinse  15 mL Mouth Rinse BID Praveen Mannam, MD   15 mL at 10/21/16 1000  . nicotine (NICODERM CQ - dosed in mg/24 hours) patch 14 mg  14 mg Transdermal Daily Praveen Mannam, MD   14 mg at 10/21/16 0845  . pantoprazole (  PROTONIX) EC tablet 40 mg  40 mg Oral QHS Jose Shirl Harris, MD   40 mg at 10/20/16 2143  . RESOURCE THICKENUP CLEAR   Oral PRN Robbie Lis, MD      . sertraline (ZOLOFT) tablet 50 mg  50 mg Oral Daily [place], MD   50 mg at 10/20/16 1401  . tamsulosin (FLOMAX) capsule 0.4 mg  0.4 mg Oral QPC breakfast Domenic Polite, MD   0.4 mg at 10/21/16 0844  . thiamine (VITAMIN B-1) tablet 400 mg  400 mg Oral Daily Juliet Rude, MD   400 mg at 10/21/16 [id number]     Discharge Medications: Please see discharge summary for a list of discharge medications.  Relevant Imaging Results:  Relevant Lab Results:   Additional [place],  [place]

## 2016-10-21 NOTE — Care Management Important Message (Signed)
Important Message  Patient Details  Name: Barry Mccoy MRN: [id number] Date of Birth: 11-09-1950   Medicare Important Message Given:  Yes    Dellie Catholic, RN 10/21/2016, 10:57 AM

## 2016-10-21 NOTE — Discharge Summary (Addendum)
Physician Discharge Summary  Barry Mccoy [id number] DOB: September 12, 1950 DOA: 10/01/2016  PCP: No PCP Per Patient  Admit date: 10/01/2016 Discharge date: 10/22/2016  Time spent: 45 minutes   Recommendations for Outpatient Follow-up:  1. Needs Neuro Cognitive testing down the road, wean off low dose Seroquel and Haldol PRN as tolerated 2. Foley Care, Voiding trial in about 1 week, will need Urology referral if fails this 3. Needs MRI Abd in 1-57months to re-evaluate possible Pancreatic cyst noted on CT abdomen 4. Needs repeat SLP eval at SNF in few days and upgrading diet 5. GI Dr.Hung in 40month for GI bleed and Pancreatic cystic lesion  Discharge Diagnoses:  Principal Problem:   Acute GI bleeding   Symptomatic anemia   Delirium/AGitation   ICU Delirium   Alcohol withdrawal   Acute respiratory failure (HCC)   Severe single current episode of major depressive disorder, with psychotic features ([place])   Acute encephalopathy   Cirrhosis on Imaging   Alcohol use disorder     Discharge Condition: improved  Diet recommendation: Dysphagia 2 diet with thin liquids       Filed Weights   10/17/16 0319 10/18/16 0553 10/20/16 2019  Weight: 97.1 kg (214 lb 1.1 oz) 91.7 kg (202 lb 2.6 oz) 91.4 kg (201 lb 8 oz)    History of present illness:  66 year old man with past medical history of migraine headaches (takes keppra), depression who presented to [facility] 10/01/16 after nearly passing out while out shopping. Pt also reported exertional dyspnea, black stools. He was hypotensive in ED and it was thought that this is due to possible GI bleed. He was transfused total of 5 units PRBC during this admission. He developed acute delirium and agitation.   Hospital Course:  66 year old man with past medical history of migraine headaches (takes keppra), depression who presented to [facility] 10/01/16 after nearly passing out while out shopping. Pt also reported exertional dyspnea, black stools. He was hypotensive  in ED and it was thought that this is due to possible GI bleed. He was transfused total of 5 units PRBC during this admission. He developed acute delirium and agitation requiring high does of Ativan resulted in somnolence and acute respiratory failure with hypoxia and apnea. He was intubated from 1/23 - 1/28. Required precedex infusion, fentanyl and Versed gtt in ICU. He was also started on Seroquel by PCCM.He also had afib/SVT/sinus tachycardia >>placed on amio IV and then converted to sinus tachycardia. Transferred from PCCM to [facility] on 10/12/16 -agitation/delirium continues to be an issue but finally improving, benzos being weaned and Seroquel being weaned off too. Has also required restraints now off  Detailed Plan:  Acute respiratory failure with hypoxia and inability to protect airways / Aspiration pneumonitis - due to severe agitation and requiring excessive doses of sedatives - Intubated form 1/23 through 1/28 - Has received total of 7 days of zosyn and needs no further antibiotics at this time.  - weaned off O2, resolved  Delirium secondary to alcohol abuse and withdrawal, ICU delirium, meds etc -severe agitation and delirium noted after admsision -Ct head on admission unremarkable and EEG 1/25 suggestive of encephalopathy, no seizure activity. -H/o EToh use, lives alone, per daughter drinks couple of drinks daily, suspect this is more, given portal gastropathy on EGD and Cirrhosis on CT abd -was in ICU, required sedation and intubation, using Precedex drip , was also on fentanyl and versed gtt. - started onseroquel 200 mg BID in ICU by PCCM due to agitationbut concern that  this med may have cause SVT. Seroquel dose cut to 100 mg BID on 2/1, now tapered down to 50mg  QHS,  remains on Zoloft 50 mg daily which he takes at home. - Patient noted overnight 2/3 to 2/4 to have hallucinations, delusions, combative, verbally abusive and striking out at staff members.4 point restraints placed,  now off -seen by Psych, recommended Haldol PRN and decreasing benzos and Seroquel at bedtime -I started cutting down Clonazepam dose day before now stopped. -Seen by NEuro, recommended high dose thiamine for few days and recommended stopping Keppra since he had no clear indication for being on this. -plan for discharge to SNF and then Neurocognitive testing down the road, please wean off Seroquel slowly and Haldol at SNF, off all restraints since Friday 2/9  Urinary retention -failed voiding trial yesterday, foley replaced, continue FLomax -voiding trial in 1 week at SNF, Urology Follow up  Pneumoperitoneum - Noted on CT scan 2/4 - Surgery has seen in consultation; benign abd exam, no evidence of evolving sepsis so surgery not required at this time. Per Dr.Blackman, uncertain of the cause of the free air or how long he has had it. He may have had an intra-abdominal event that is now walled itself off.  -no abd symptoms, tolerating diet  Anemia of chronic disease / Acute upper GI bleed - Likely related to portal gastropathy.  - S/P EGD 10/04/2016--with portal gastropathy  - S/P 5 units PRBC transfusion  - now Hb stable  Suspected Cirrhosis on CT Abd -likely due to ETOH, EGD with portal gastropathy noted, s/o of chronic liver disease -advised FU with GI  Hypokalemia - Due to alcohol abuse - Supplemented  SVT - while in ICU,  CTA negative for PE - Started on amiodarone drip on 2/1 but he is now in sinus rhythm  - D/C'ed amio 2/1, started low dose metoprolol  AKI - Resolvedwith fluids - Cr WNL  Suspected pseudocyst in the pancreas -needs Outpatient MRI abd to re-evaluate pancreas/cyst etc  Consultations:  Neurology  GI  PCCM    Discharge Exam: Vitals:   10/20/16 2203 10/21/16 0505  BP: 130/84 139/79  Pulse: (!) 146 91  Resp: 20 20  Temp: 98.6 F (37 C) 98.4 F (36.9 C)     General: alert, awake, intermittently confused, oriented to self, partly to  place Cardiovascular: S!S2/RRR Respiratory: CTAB   Discharge Instructions   Discharge Instructions    Discharge instructions    Complete by:  As directed    Dysphagia 2 diet, nectar thick liquids   Increase activity slowly    Complete by:  As directed      Current Discharge Medication List    START taking these medications   Details  haloperidol (HALDOL) 1 MG tablet Take 1 tablet (1 mg total) by mouth every 8 (eight) hours as needed for agitation. Qty: 15 tablet, Refills: 0    metoprolol tartrate (LOPRESSOR) 25 MG tablet Take 1 tablet (25 mg total) by mouth 2 (two) times daily.    nicotine (NICODERM CQ - DOSED IN MG/24 HOURS) 14 mg/24hr patch Place 1 patch (14 mg total) onto the skin daily. Qty: 28 patch, Refills: 0    pantoprazole (PROTONIX) 40 MG tablet Take 1 tablet (40 mg total) by mouth daily.    QUEtiapine (SEROQUEL) 50 MG tablet Take 1 tablet (50 mg total) by mouth at bedtime.    thiamine 100 MG tablet Take 4 tablets (400 mg total) by mouth daily. For 4days then down  to 100mg  daily      CONTINUE these medications which have CHANGED   Details  baclofen (LIORESAL) 20 MG tablet Take 1 tablet (20 mg total) by mouth at bedtime. Refills: 0    traZODone (DESYREL) 100 MG tablet Take 2 tablets (200 mg total) by mouth at bedtime.      CONTINUE these medications which have NOT CHANGED   Details  aspirin EC 81 MG tablet Take 81 mg by mouth daily.    Cholecalciferol (VITAMIN D-3) 5000 units TABS Take 5,000 Units by mouth at bedtime.    lidocaine (LIDODERM) 5 % Place 2 patches onto the skin daily as needed (pain). Remove & Discard patch within 12 hours or as directed by MD    Multiple Vitamin (MULTIVITAMIN WITH MINERALS) TABS tablet Take 1 tablet by mouth at bedtime.    OnabotulinumtoxinA (BOTOX IJ) Inject as directed See admin instructions. Botox injections done at Dr. Audery Amel office approximately every 90 days    sertraline (ZOLOFT) 50 MG tablet Take 50 mg by mouth  at bedtime.    tamsulosin (FLOMAX) 0.4 MG CAPS capsule Take 0.4 mg by mouth at bedtime.      STOP taking these medications     levETIRAcetam (KEPPRA) 500 MG tablet      NON FORMULARY      diphenhydrAMINE (BENADRYL) 25 MG tablet      loratadine (CLARITIN) 10 MG tablet      naproxen sodium (ALEVE) 220 MG tablet      OVER THE COUNTER MEDICATION      OVER THE COUNTER MEDICATION      OVER THE COUNTER MEDICATION      promethazine (PHENERGAN) 25 MG tablet        Allergies  Allergen Reactions  . Shellfish Allergy Other (See Comments)    Unknown    Follow-up Information    pcp. Schedule an appointment as soon as possible for a visit in 1 week(s).            The results of significant diagnostics from this hospitalization (including imaging, microbiology, ancillary and laboratory) are listed below for reference.    Significant Diagnostic Studies: Ct Head Wo Contrast  Result Date: 10/14/2016 CLINICAL DATA:  Altered mental status. EXAM: CT HEAD WITHOUT CONTRAST TECHNIQUE: Contiguous axial images were obtained from the base of the skull through the vertex without intravenous contrast. COMPARISON:  10/03/2016 FINDINGS: Brain: No evidence of acute infarction, hemorrhage, hydrocephalus, or mass lesion/mass effect. Small hygroma posterior to the left cerebellum is stable at 4 mm maximal thickness. No significant mass effect. Stable pattern of mild chronic microvascular disease. Dilated perivascular space versus remote lacune along the lower right putamen. Vascular: Atherosclerotic calcification.  No hyperdense vessel. Skull: No acute finding Sinuses/Orbits: Negative IMPRESSION: No acute finding or change from prior. Electronically Signed   By: Monte Fantasia M.D.   On: 10/14/2016 16:04   Ct Head Wo Contrast  Result Date: 10/03/2016 CLINICAL DATA:  66 year old male with acute encephalopathy. EXAM: CT HEAD WITHOUT CONTRAST TECHNIQUE: Contiguous axial images were obtained from the base  of the skull through the vertex without intravenous contrast. COMPARISON:  None. FINDINGS: Brain: The ventricles and sulci appropriate in size for patient's age. Mild periventricular and deep white matter chronic microvascular ischemic changes noted. A focal area of hypodensity in the right occipital subcortical white matter likely represent chronic microvascular ischemic changes. 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 There is no acute intracranial hemorrhage. No mass effect or midline shift noted.  No extra-axial fluid collection. Vascular: No hyperdense vessel or unexpected calcification. Skull: Normal. Negative for fracture or focal lesion. Sinuses/Orbits: No acute finding. Other: Partially visualized support tubes in the mouth. IMPRESSION: No acute intracranial hemorrhage. Mild chronic microvascular ischemic changes. If symptoms persist and there are no contraindications, MRI may provide better evaluation if clinically indicated. Electronically Signed   By: Anner Crete M.D.   On: 10/03/2016 01:28   Ct Angio Chest Pe W Or Wo Contrast  Result Date: 10/14/2016 CLINICAL DATA:  Hypoxia, agitation, SVT, elevated D-dimer. EXAM: CT ANGIOGRAPHY CHEST WITH CONTRAST TECHNIQUE: Multidetector CT imaging of the chest was performed using the standard protocol during bolus administration of intravenous contrast. Multiplanar CT image reconstructions and MIPs were obtained to evaluate the vascular anatomy. CONTRAST:  100 cc Isovue 370 COMPARISON:  None. FINDINGS: Cardiovascular: Beam hardening artifact, presumably from patient's arms, considerably limits characterization of the peripheral segmental and subsegmental pulmonary artery branches to the lower lobes bilaterally. I cannot exclude small peripheral pulmonary embolism. There is no pulmonary embolism identified within the main, lobar or central segmental pulmonary arteries bilaterally. Heart size is normal. No pericardial effusion. No aortic aneurysm or  dissection. Mild atherosclerotic changes noted along the walls of the descending thoracic aorta. Coronary artery calcifications noted. Mediastinum/Nodes: No mass or enlarged lymph nodes within the mediastinum or perihilar regions. Esophagus appears normal. Trachea and central bronchi are unremarkable. Lungs/Pleura: Small right pleural effusion with adjacent compressive atelectasis. Mild atelectasis at the left lung base. Hazy ground-glass opacities within the upper lobes bilaterally, likely additional atelectasis or perhaps mild edema. No pneumothorax. 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 Upper Abdomen: Free intraperitoneal air. Small amount of free fluid in the left upper quadrant. Mass versus fluid collection adjacent to the pancreatic tail, incompletely imaged. Multiple gallstones within the gallbladder, incompletely imaged. Musculoskeletal: No acute or suspicious osseous lesion. Mild degenerative spurring within the thoracic spine. Superficial soft tissues are unremarkable. Review of the MIP images confirms the above findings. IMPRESSION: 1. Free intraperitoneal air within the upper abdomen. In the absence of a recent surgery, this suggests viscus perforation. CT abdomen and pelvis is recommended for further characterization. 2. No pulmonary embolism seen, with study limitations detailed above. 3. Small right pleural effusion. Mild bibasilar atelectasis. Mild atelectasis versus edema within the upper lobes bilaterally. No evidence of pneumonia. 4. Free fluid in the left upper abdomen. Mass versus fluid collection adjacent to the pancreatic tail, incompletely imaged. Cholelithiasis, incompletely imaged. 5. Aortic atherosclerosis. Critical Value/emergent results were called by telephone at the time of interpretation on 10/14/2016 at 4:25 pm to Dr. Irine Seal , who verbally acknowledged these results. Electronically Signed   By: Franki Cabot M.D.   On: 10/14/2016 16:29    Ct Abdomen Pelvis W Contrast  Addendum Date: 10/14/2016   ADDENDUM REPORT: 10/14/2016 21:30 ADDENDUM: Findings discussed with K. Schorr 10/14/2016  at 9:30 pm. Electronically Signed   By: Monte Fantasia M.D.   On: 10/14/2016 21:30   Result Date: 10/14/2016 CLINICAL DATA:  Follow-up chest CT.  Free air. EXAM: CT ABDOMEN AND PELVIS WITH CONTRAST TECHNIQUE: Multidetector CT imaging of the abdomen and pelvis was performed using the standard protocol following bolus administration of intravenous contrast. CONTRAST:  38mL ISOVUE-300 IOPAMIDOL (ISOVUE-300) INJECTION 61% COMPARISON:  None. FINDINGS: Lower chest: Described separately. Small pleural effusions and atelectasis Hepatobiliary: Low-density along the falciform ligament is likely perfusion anomaly. Cannot exclude cirrhosis. The liver surface is lobulated. Cholelithiasis. No signs of acute cholecystitis.  Pancreas: Cystic density of the pancreatic tail measuring 26 mm. No surrounding inflammatory changes. There is a coarse calcification and neighboring pancreatic tail which is somewhat full. Some this fullness may be related to venous collaterals. Spleen: Enlarged this 17 cm span. This is likely related to chronic splenic vein occlusion. There are well-formed venous collaterals, including proximal gastric varices. Adrenals/Urinary Tract: Negative adrenals. No hydronephrosis or stone. Urinary bladder predominately decompressed by Foley catheter. Stomach/Bowel: Numerous colonic diverticula. There is question of diverticulitis at the splenic flexure, but no noted gas in this region to explain pneumoperitoneum. No inflammatory changes noted around the stomach or proximal duodenum. No visible ulcer. Proximal gastric varices. Negative appendix. Vascular/Lymphatic: No acute vascular abnormality. Splenic vein occlusion as described. Extensive atherosclerosis. No mass or adenopathy. Reproductive:No pathologic findings. Other: Moderate volume pneumoperitoneum, maximal in  the upper ventral abdomen, but also present in the left lower quadrant. Musculoskeletal:  Degenerative changes.  No acute abnormalities. Findings are known based on previous recorded communications. Mid level has been paged to make aware of the completed scan. IMPRESSION: 1. No definitive source for the patient's moderate pneumoperitoneum. Given there is no inflammatory changes or visible ulcer along the stomach or proximal duodenum, a diverticular source is favored. There may be mild diverticulitis along the splenic flexure, but no extraluminal gas in this region. 2. 26 mm fluid collection in the splenic fossa. Suspect pseudocyst as there is ductal or parenchymal calcification in the pancreatic tail. Pancreatic tail is full and pancreas protocol CT or MRI follow-up is recommended after convalescence to exclude mass. 3. 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 Patient was admitted with GI bleeding. 4. Cholelithiasis. 5. Suspected cirrhosis. 6. Splenomegaly. Electronically Signed: By: Monte Fantasia M.D. On: 10/14/2016 21:19   Dg Chest Port 1 View  Result Date: 10/11/2016 CLINICAL DATA:  Respiratory failure, GI bleeding, encephalopathy, current smoker. EXAM: PORTABLE CHEST 1 VIEW COMPARISON:  Portable chest x-ray of October 07, 2016 FINDINGS: The lungs are well-expanded. The interstitial markings remain increased. The hemidiaphragms are now all well demonstrated. Basilar parenchymal density has markedly improved. The heart remains mildly enlarged. The central pulmonary vascularity remains engorged. The trachea is midline. The bony thorax exhibits no acute abnormality. IMPRESSION: Improved bibasilar atelectasis or pneumonia. Persistent pulmonary interstitial edema and pulmonary vascular congestion consistent with CHF. Electronically Signed   By: David  Martinique M.D.   On: 10/11/2016 07:06   Dg Chest Port 1 View  Result Date: 10/07/2016 CLINICAL DATA:  Acute respiratory failure  EXAM: PORTABLE CHEST 1 VIEW COMPARISON:  10/06/2016 FINDINGS: Support devices are unchanged. Mild cardiomegaly. Bilateral airspace opacities and probable layering effusions. Overall aeration may have decreased slightly since prior study. IMPRESSION: Bilateral airspace disease and layering effusions, slightly worsened since prior study. Electronically Signed   By: Rolm Baptise M.D.   On: 10/07/2016 07:20   Dg Chest Port 1 View  Result Date: 10/06/2016 CLINICAL DATA:  Endotracheal tube EXAM: PORTABLE CHEST 1 VIEW COMPARISON:  10/05/2016 FINDINGS: Endotracheal tube 2 cm above the carina. Right jugular central venous catheter tip in the SVC. Feeding tube in place with the tip not visualized Bibasilar airspace disease similar to yesterday. Small right effusion. No pneumothorax IMPRESSION: Endotracheal tube 2 cm above the carina Bibasilar airspace disease unchanged from the prior study. Electronically Signed   By: Franchot Gallo M.D.   On: 10/06/2016 07:08   Dg Chest Port 1 View  Result Date: 10/05/2016 CLINICAL DATA:  Check endotracheal tube EXAM: PORTABLE CHEST 1 VIEW  COMPARISON:  10/04/2016 FINDINGS: Endotracheal tube and right jugular central line are again seen and stable. Cardiac shadow is within normal limits. Patients significant rotated to the right accentuating the mediastinal markings. Small bilateral pleural effusions are noted. No other focal infiltrate is seen. IMPRESSION: Endotracheal tube in satisfactory position. Small bilateral pleural effusions are noted. Electronically Signed   By: Inez Catalina M.D.   On: 10/05/2016 08:44   Dg Chest Port 1 View  Result Date: 10/04/2016 CLINICAL DATA:  Post central line placement EXAM: PORTABLE CHEST 1 VIEW COMPARISON:  Portable exam 1232 hours compared to 10/07/2016 at 0524 hours FINDINGS: Tip of endotracheal tube projects 7.0 cm above carina. New RIGHT jugular central venous catheter with tip projecting over SVC. Stable heart size and mediastinal contours.  Bronchitic changes and accentuated perihilar markings stable. Bibasilar atelectasis. LEFT lateral costophrenic angle excluded. No new infiltrate or pneumothorax. Bones demineralized. IMPRESSION: No pneumothorax following RIGHT jugular line placement. Bronchitic changes with bibasilar atelectasis. Electronically Signed   By: Lavonia Dana M.D.   On: 10/04/2016 12:48   Dg Chest Port 1 View  Result Date: 10/04/2016 CLINICAL DATA:  Intubation. EXAM: PORTABLE CHEST 1 VIEW COMPARISON:  10/03/2016. FINDINGS: Endotracheal tube in stable position. Stable cardiomegaly. No pulmonary venous congestion. Progressive dense right lower lobe atelectasis. Mild left base subsegmental atelectasis. Small bilateral pleural effusions cannot be excluded. IMPRESSION: 1. Endotracheal tube in stable position. 2. Low lung volumes. Progressive dense right lower lobe atelectasis. Improved aeration of the left base with mild residual infiltrate/edema. 3. Small bilateral pleural effusions . Electronically Signed   By: Marcello Moores  Register   On: 10/04/2016 07:09   Dg Chest Port 1 View  Result Date: 10/03/2016 CLINICAL DATA:  Hypoxia, intubated, smoker EXAM: PORTABLE CHEST 1 VIEW COMPARISON:  Portable exam 0901 hours compared to 10/02/2016 FINDINGS: Tip of endotracheal tube projects 3.3 cm above carina. Enlargement of cardiac silhouette. Mediastinal contours and pulmonary vascularity normal. Persistent LEFT lower lobe infiltrate question pneumonia. Central peribronchial thickening with question minimal atelectasis or infiltrate at RIGHT base. Upper lungs clear. No definite pleural effusion or pneumothorax. IMPRESSION: Persistent LEFT lower lobe infiltrate with question minimal atelectasis versus infiltrate RIGHT base. Electronically Signed   By: Lavonia Dana M.D.   On: 10/03/2016 09:11   Portable Chest Xray  Result Date: 10/02/2016 CLINICAL DATA:  Hypoxia EXAM: PORTABLE CHEST 1 VIEW COMPARISON:  Chest CT October 01, 2016 FINDINGS: Endotracheal  tube tip is 2.5 cm above the carina. Nasogastric tube tip and side port are below the diaphragm. No pneumothorax. There is patchy consolidation in the left lower lobe. Lungs elsewhere are clear. There is mild cardiomegaly with pulmonary venous hypertension. No evident adenopathy. No bone lesions. IMPRESSION: Tube positions as described without pneumothorax. Left lower lobe consolidation, likely pneumonia. There is cardiomegaly with pulmonary venous hypertension indicative of a degree of pulmonary vascular congestion. Electronically Signed   By: Lowella Grip III M.D.   On: 10/02/2016 17:30   Dg Chest Port 1v Same Day  Result Date: 10/04/2016 CLINICAL DATA:  Evaluate ET tube placement. EXAM: PORTABLE CHEST 1 VIEW COMPARISON:  Chest radiograph earlier same day. FINDINGS: ET tube terminates in the mid to distal trachea. Monitoring leads overlie the patient. Right IJ central venous catheter tip projects over the superior vena cava. Stable cardiac and mediastinal contours, enlarged. Grossly unchanged mid lower lung airspace opacities. Probable small bilateral layering pleural effusions. IMPRESSION: ET tube terminates in the mid to distal trachea. Electronically Signed   By: Lovey Newcomer  M.D.   On: 10/04/2016 20:24   Dg Chest Port 1v Same Day  Result Date: 10/04/2016 CLINICAL DATA:  Patient status post intubation. EXAM: PORTABLE CHEST 1 VIEW COMPARISON:  Chest radiograph 10/04/2016. FINDINGS: Right IJ central venous catheter tip projects over the superior vena cava. The ET tube is visualized to the distal trachea. The distal aspect of the ET tube is not well demonstrated. Monitoring leads overlie the patient. Stable enlarged cardiac and mediastinal contours. Grossly unchanged bilateral mid and lower lung, right-greater-than-left heterogeneous opacities. Possible small layering bilateral pleural effusions. IMPRESSION: ET tube is visualized to the distal trachea. Given technique, the distal aspect of the ET tube  is not demonstrated. Recommend repeat evaluation. Re- demonstrated bilateral mid and lower lung heterogeneous opacities. These results will be called to the ordering clinician or representative by the Radiologist Assistant, and communication documented in the PACS or zVision Dashboard. Electronically Signed   By: Lovey Newcomer M.D.   On: 10/04/2016 18:53   Dg Abd Portable 1v  Result Date: 10/08/2016 CLINICAL DATA:  Feeding tube placement. EXAM: PORTABLE ABDOMEN - 1 VIEW COMPARISON:  10/05/2016 FINDINGS: The feeding tube tip is at the junction of the second and third portions of the duodenum. Air throughout the small bowel and colon suggesting an ileus. IMPRESSION: Feeding tube tip is in the distal descending duodenum. Electronically Signed   By: Marijo Sanes M.D.   On: 10/08/2016 12:53   Dg Abd Portable 1v  Result Date: 10/05/2016 CLINICAL DATA:  Feeding tube placement EXAM: PORTABLE ABDOMEN - 1 VIEW COMPARISON:  10/03/2016 FINDINGS: Feeding tube tip is in the descending duodenum. IMPRESSION: Feeding tube tip in the descending duodenum. Electronically Signed   By: Rolm Baptise M.D.   On: 10/05/2016 11:30   Dg Abd Portable 1v  Result Date: 10/03/2016 CLINICAL DATA:  Assess orogastric tube placement. EXAM: PORTABLE ABDOMEN - 1 VIEW COMPARISON:  Abdominal radiograph of October 02, 2016 FINDINGS: The orogastric tube tip lies in the mid gastric body. The proximal port lies just below the GE junction. The observed bowel gas pattern is unremarkable. IMPRESSION: Positioning of the orogastric tube since at the proximal port is just below the expected location of the GE junction. Advancement by 5-10 cm is recommended to assure that the proximal port remains below the GE junction. Electronically Signed   By: David  Martinique M.D.   On: 10/03/2016 07:57   Dg Abd Portable 1v  Result Date: 10/02/2016 CLINICAL DATA:  Nasogastric tube placement EXAM: PORTABLE ABDOMEN - 1 VIEW COMPARISON:  None. FINDINGS: Nasogastric  tube tip and side port are in the stomach. There is no bowel dilatation or air-fluid level suggesting bowel obstruction. No free air. IMPRESSION: Nasogastric tube tip and side port in stomach. Bowel gas pattern unremarkable. Electronically Signed   By: Lowella Grip III M.D.   On: 10/02/2016 17:28   Dg Swallowing Func-speech Pathology  Result Date: 10/20/2016 Objective Swallowing Evaluation: Type of Study: MBS-Modified Barium Swallow Study Patient Details Name: Barry Mccoy MRN: [id number] Date of Birth: [birth year] Today's Date: 10/20/2016 Time: SLP Start Time (ACUTE ONLY): 1110-SLP Stop Time (ACUTE ONLY): 1125 SLP Time Calculation (min) (ACUTE ONLY): 15 min Past Medical History: Past Medical History: Diagnosis Date . Muscle spasm  Past Surgical History: Past Surgical History: Procedure Laterality Date . ELBOW SURGERY   . ESOPHAGOGASTRODUODENOSCOPY N/A 10/04/2016  Procedure: ESOPHAGOGASTRODUODENOSCOPY (EGD);  Surgeon: Carol Ada, MD;  Location: [facility];  Service: Gastroenterology;  Laterality: N/A; HPI: 66 year old male admitted 10/01/16 due  to near syncope, AMS, acute encephalopathy and intermittent hypoxia. Pt intubated 1/24-28/18 due to respiratory distress. UGI 10/04/16 - hypertensive gastropathy vs inflammatory gastritis. CXR 10/07/16 - bilateral airspace disease. PMH unremarkable. No Data Recorded Assessment / Plan / Recommendation CHL IP CLINICAL IMPRESSIONS 10/20/2016 Therapy Diagnosis -- Clinical Impression Pt's swallow ability has improved from previous MBS. Mild sensorimotor (motor>sensory) oral and pharyngeal dysphagia. Laryngeal penetration present with thin, mostly flash although minimal amount remained in vestibule following large and consecutive sips thin in which penetration occured before the swallow. Delayed oral transit (thin) and prolonged mastication with solid texture. Reduced tongue base retraction and decreased laryngeal elevation led to mild valleculae (mostly) and pyriform sinus  residue. Verbal cues for second swallow cleared majority of residue. Smaller cup sips mitigated occurance of penetration. Recommend he continue with Dys 2 texture however upgrade liquids to thin, small sips, straws allowed and swallow twice after bites/sips.    Impact on safety and function --   CHL IP TREATMENT RECOMMENDATION 10/19/2016 Treatment Recommendations Therapy as outlined in treatment plan below   Prognosis 10/19/2016 Prognosis for Safe Diet Advancement Good Barriers to Reach Goals Cognitive deficits Barriers/Prognosis Comment -- CHL IP DIET RECOMMENDATION 10/19/2016 SLP Diet Recommendations Dysphagia 2 (Fine chop) solids;Thin liquid Liquid Administration via Cup;No straw Medication Administration Whole meds with puree Compensations Slow rate;Minimize environmental distractions;Small sips/bites Postural Changes Seated upright at 90 degrees   CHL IP OTHER RECOMMENDATIONS 10/19/2016 Recommended Consults -- Oral Care Recommendations Oral care BID Other Recommendations --   CHL IP FOLLOW UP RECOMMENDATIONS 10/19/2016 Follow up Recommendations Other (comment)   CHL IP FREQUENCY AND DURATION 10/19/2016 Speech Therapy Frequency (ACUTE ONLY) min 2x/week Treatment Duration 2 weeks      CHL IP ORAL PHASE 10/19/2016 Oral Phase Impaired Oral - Pudding Teaspoon -- Oral - Pudding Cup -- Oral - Honey Teaspoon -- Oral - Honey Cup -- Oral - Nectar Teaspoon -- Oral - Nectar Cup -- Oral - Nectar Straw NT Oral - Thin Teaspoon -- Oral - Thin Cup WFL Oral - Thin Straw WFL Oral - Puree NT Oral - Mech Soft NT Oral - Regular Lingual/palatal residue Oral - Multi-Consistency -- Oral - Pill -- Oral Phase - Comment --  CHL IP PHARYNGEAL PHASE 10/19/2016 Pharyngeal Phase Impaired Pharyngeal- Pudding Teaspoon -- Pharyngeal -- Pharyngeal- Pudding Cup -- Pharyngeal -- Pharyngeal- Honey Teaspoon -- Pharyngeal -- Pharyngeal- Honey Cup -- Pharyngeal -- Pharyngeal- Nectar Teaspoon -- Pharyngeal -- Pharyngeal- Nectar Cup NT Pharyngeal -- Pharyngeal- Nectar  Straw NT Pharyngeal -- Pharyngeal- Thin Teaspoon -- Pharyngeal -- Pharyngeal- Thin Cup Penetration/Aspiration during swallow;Penetration/Aspiration before swallow;Pharyngeal residue - valleculae;Pharyngeal residue - pyriform;Reduced laryngeal elevation;Reduced tongue base retraction;Delayed swallow initiation-pyriform sinuses Pharyngeal Material enters airway, remains ABOVE vocal cords then ejected out;Material enters airway, remains ABOVE vocal cords and not ejected out Pharyngeal- Thin Straw Delayed swallow initiation-pyriform sinuses;Pharyngeal residue - valleculae;Pharyngeal residue - pyriform;Reduced tongue base retraction;Reduced laryngeal elevation Pharyngeal Material does not enter airway Pharyngeal- Puree NT Pharyngeal -- Pharyngeal- Mechanical Soft NT Pharyngeal -- Pharyngeal- Regular WFL Pharyngeal -- Pharyngeal- Multi-consistency -- Pharyngeal -- Pharyngeal- Pill -- Pharyngeal -- Pharyngeal Comment --  CHL IP CERVICAL ESOPHAGEAL PHASE 10/19/2016 Cervical Esophageal Phase (No Data) Pudding Teaspoon -- Pudding Cup -- Honey Teaspoon -- Honey Cup -- Nectar Teaspoon -- Nectar Cup -- Nectar Straw -- Thin Teaspoon -- Thin Cup -- Thin Straw -- Puree -- Mechanical Soft -- Regular -- Multi-consistency -- Pill -- Cervical Esophageal Comment -- No flowsheet data found. [name] 10/20/2016, 2:34 PM  Orbie Pyo [place].Ed CCC-SLP Pager [phone]              Dg Swallowing Func-speech Pathology  Result Date: 10/17/2016 Objective Swallowing Evaluation: Type of Study: MBS-Modified Barium Swallow Study Patient Details Name: Barry Mccoy MRN: [id number] Date of Birth: Dec 19, 1950 Today's Date: 10/17/2016 Time: SLP Start Time (ACUTE ONLY): 1145-SLP Stop Time (ACUTE ONLY): 1202 SLP Time Calculation (min) (ACUTE ONLY): 17 min Past Medical History: Past Medical History: Diagnosis Date . Muscle spasm  Past Surgical History: Past Surgical History: Procedure Laterality Date . ELBOW SURGERY   . ESOPHAGOGASTRODUODENOSCOPY N/A  10/04/2016  Procedure: ESOPHAGOGASTRODUODENOSCOPY (EGD);  Surgeon: Carol Ada, MD;  Location: [facility];  Service: Gastroenterology;  Laterality: N/A; HPI: 66 year old male admitted 10/01/16 due to near syncope, AMS, acute encephalopathy and intermittent hypoxia. Pt intubated 1/24-28/18 due to respiratory distress. UGI 10/04/16 - hypertensive gastropathy vs inflammatory gastritis. CXR 10/07/16 - bilateral airspace disease. PMH unremarkable. No Data Recorded Assessment / Plan / Recommendation CHL IP CLINICAL IMPRESSIONS 10/17/2016 Therapy Diagnosis Mild pharyngeal phase dysphagia;Mild oral phase dysphagia Clinical Impression Pt demonstrates a mild oropharyngeal dysphagia, likely secondary to sluggish motor control and lethargy associated with this acute illness. Pt difficult to arouse for MBS, continues to be dysarthric and impulsive with self feeding. Primary problem is impulsive, overlarge intake of cup and straw sips of thin liquids leading to premature spillage into the vestibule and delayed swallow initiation with penetration before the swallow. Penetration events are silent, but clear with a cued throat clear. Pts dentures were not present for this test, but mastication of solids was low, requiring verbal cues. Recommend pt continue current diet recommendation of nectar thick liquids and dys 2(fine chopped solids) until mentation improves and arousal is consistent. Pt should not remain on nectar thick liquids long term as dysphagia is relatively mild. Will discuss with pts daughter.  Impact on safety and function Mild aspiration risk   CHL IP TREATMENT RECOMMENDATION 10/17/2016 Treatment Recommendations Therapy as outlined in treatment plan below   Prognosis 10/17/2016 Prognosis for Safe Diet Advancement Good Barriers to Reach Goals Cognitive deficits Barriers/Prognosis Comment -- CHL IP DIET RECOMMENDATION 10/17/2016 SLP Diet Recommendations Dysphagia 2 (Fine chop) solids;Nectar thick liquid Liquid Administration via  Cup;Straw Medication Administration Whole meds with puree Compensations Slow rate;Small sips/bites Postural Changes Seated upright at 90 degrees   CHL IP OTHER RECOMMENDATIONS 10/17/2016 Recommended Consults -- Oral Care Recommendations Oral care BID Other Recommendations Order thickener from pharmacy   CHL IP FOLLOW UP RECOMMENDATIONS 10/17/2016 Follow up Recommendations Skilled Nursing facility   [facility] IP FREQUENCY AND DURATION 10/17/2016 Speech Therapy Frequency (ACUTE ONLY) min 2x/week Treatment Duration 2 weeks      CHL IP ORAL PHASE 10/17/2016 Oral Phase Impaired Oral - Pudding Teaspoon -- Oral - Pudding Cup -- Oral - Honey Teaspoon -- Oral - Honey Cup -- Oral - Nectar Teaspoon -- Oral - Nectar Cup -- Oral - Nectar Straw WFL Oral - Thin Teaspoon -- Oral - Thin Cup Premature spillage Oral - Thin Straw Premature spillage Oral - Puree Decreased bolus cohesion;Delayed oral transit Oral - Mech Soft Decreased bolus cohesion;Delayed oral transit;Lingual/palatal residue;Impaired mastication Oral - Regular -- Oral - Multi-Consistency -- Oral - Pill -- Oral Phase - Comment --  CHL IP PHARYNGEAL PHASE 10/17/2016 Pharyngeal Phase Impaired Pharyngeal- Pudding Teaspoon -- Pharyngeal -- Pharyngeal- Pudding Cup -- Pharyngeal -- Pharyngeal- Honey Teaspoon -- Pharyngeal -- Pharyngeal- Honey Cup -- Pharyngeal -- Pharyngeal- Nectar Teaspoon -- Pharyngeal -- Pharyngeal- Nectar Cup [facility]  Pharyngeal -- Pharyngeal- Nectar Straw WFL Pharyngeal -- Pharyngeal- Thin Teaspoon -- Pharyngeal -- Pharyngeal- Thin Cup Delayed swallow initiation-pyriform sinuses;Penetration/Aspiration before swallow Pharyngeal Material enters airway, remains ABOVE vocal cords then ejected out;Material does not enter airway;Material enters airway, CONTACTS cords and then ejected out Pharyngeal- Thin Straw Delayed swallow initiation-pyriform sinuses;Penetration/Aspiration before swallow Pharyngeal Material enters airway, CONTACTS cords and not ejected out Pharyngeal- Puree WFL  Pharyngeal -- Pharyngeal- Mechanical Soft WFL Pharyngeal -- Pharyngeal- Regular -- Pharyngeal -- Pharyngeal- Multi-consistency -- Pharyngeal -- Pharyngeal- Pill -- Pharyngeal -- Pharyngeal Comment --  No flowsheet data found. No flowsheet data found. Herbie [place], [place] CCC-SLP [phone] Othelia Pulling Katherene Ponto 10/17/2016, 2:07 PM              Ct Angio Chest/abd/pel For Dissection W And/or Wo Contrast  Result Date: 10/01/2016 CLINICAL DATA:  66 year old male with left-sided flank pain, concerning for dissection. EXAM: CT ANGIOGRAPHY CHEST, ABDOMEN AND PELVIS TECHNIQUE: Multidetector CT imaging through the chest, abdomen and pelvis was performed using the standard protocol during bolus administration of intravenous contrast. Multiplanar reconstructed images including MIPs were obtained and reviewed to evaluate the vascular anatomy. CONTRAST:  100 cc Isovue 370 COMPARISON:  None. FINDINGS: CTA CHEST FINDINGS Cardiovascular: Heart: No cardiomegaly. No pericardial fluid/thickening. Calcifications of the left main, left anterior descending, circumflex coronary arteries. Aorta: Unremarkable course, caliber, contour of the thoracic aorta. No aneurysm or dissection flap. No periaortic fluid. Soft plaque of the aortic arch with minimal calcifications. Mixed atherosclerotic and soft plaque of the descending thoracic aorta. Pulmonary arteries: No central, lobar, segmental, or proximal subsegmental filling defects. Mediastinum/Nodes: Mediastinal lymph nodes are present, none of which are enlarged by CT size criteria. Unremarkable appearance of the thoracic esophagus. Unremarkable appearance of the thoracic inlet and thyroid. Lungs/Pleura: No pneumothorax or pleural effusion. Centrilobular emphysema with minimal paraseptal emphysema at the lung apices. No confluent airspace disease. No significant bronchial wall thickening. No pneumothorax. Musculoskeletal: No displaced fracture. Degenerative changes of the thoracic spine.  Review of the MIP images confirms the above findings. CTA ABDOMEN AND PELVIS FINDINGS VASCULAR Aorta: Mixed calcified and soft plaque of the abdominal aorta. No aneurysm. No periaortic fluid. No dissection. Celiac: Typical branch pattern of the celiac artery, with left gastric, common hepatic, and splenic artery identified. SMA: No significant atherosclerotic changes of the superior mesenteric artery. Renals: Renal arteries are patent. Single left and single right renal artery. IMA: Inferior mesenteric artery is patent. Left colic artery patent. Superior rectal artery patent. Right lower extremity: Unremarkable course, caliber, and contour of the right iliac system. No aneurysm, dissection, or occlusion. Hypogastric artery is patent. Anterior and posterior division patent. Common femoral artery patent. Proximal SFA and profunda femoris patent. Left lower extremity: Unremarkable course, caliber, and contour of the left iliac system. No aneurysm, dissection, or occlusion. Hypogastric artery is patent. Anterior and posterior division patent. Common femoral artery patent. Proximal SFA and profunda femoris patent. Veins: Splenic vein appears attenuated posterior to the body of the pancreas, though not well evaluated secondary to the phase of the contrast. Portal vein not well evaluated by the phase of the contrast bolus. Review of the MIP images confirms the above findings. NON-VASCULAR Hepatobiliary: Unremarkable appearance of liver. The gallbladder demonstrates lumen full of non radiopaque stones. Pericholecystic fluid is present with questionable gallbladder wall thickening. No intrahepatic or extrahepatic biliary ductal dilatation. Pancreas: No inflammatory changes at the head of the pancreas. Body the pancreas relatively unremarkable. Coarse calcification the tail of the pancreas. Focal fluid at  the tail the pancreas extending towards the inferior pole of the spleen and adjacent to the splenic flexure, within the  splenic colic ligament. No pancreatic ductal dilation. Spleen: Fluid and inflammatory changes adjacent to the inferior tip of the spleen. Adrenals/Urinary Tract: Unremarkable appearance of adrenal glands. No evidence of right-sided or left-sided hydronephrosis. Likely nonobstructive nephrolithiasis bilateral kidneys. Course the bilateral ureters unremarkable. Unremarkable appearance of the urinary bladder. Stomach/Bowel: Inflammatory changes adjacent to the splenic flexure of the colon. Circumferential wall thickening of the distal transverse colon and sigmoid colon. Small fat focus on the lateral sigmoid colon (image 170 series 6). Extensive colonic diverticula of the sigmoid colon, descending colon, and the distal transverse colon. Unremarkable stomach. Unremarkable small bowel with no abnormal distention. Normal appendix. Lymphatic: Lymph nodes within the preaortic and para-aortic nodal station. Mesenteric: Inflammatory changes within the left upper quadrant adjacent to the spleen. Inflammatory changes of the fat adjacent the gallbladder extending into the hilum of the liver surrounding portal venous structures. No free fluid. Reproductive: Unremarkable appearance of the pelvic organs. Other: No hernia. Musculoskeletal: No acute fracture identified. Multilevel degenerative changes of the lower lumbar spine worst at the L2-L3 level and L5-S1 level. Bilateral facet disease worst in the lower lumbar spine. Degenerative changes bilateral hips. Review of the MIP images confirms the above findings. IMPRESSION: Study is negative for findings of acute aortic syndrome. Significant inflammatory changes within the left upper quadrant at the tail the pancreas and splenic flexure of the colon. There is apparent circumferential colon wall thickening, and small volume of free fluid. Differential diagnosis includes distal pancreatitis, diverticulitis/epiploic appendagitis, focal ischemia, or potentially sequela of portal  hypertension/sinistral portal hypertension. Considered much less likely would be occult colon cancer with perforation. Cholelithiasis with circumferential fluid/gallbladder wall thickening. If there is concern for acute cholecystitis, correlation with lab values and potentially HIDA scan may be considered. Inflammatory changes adjacent to the gallbladder extend towards the liver hilum surrounding portal venous structures, and there is small caliber splenic vein posterior to the pancreas. Portal vein not well evaluated given the absence of a delayed CT phase. If there is concern for portal vein abnormality/thrombus, repeat contrast-enhanced CT with 8 delayed portal venous phase may be considered. The above results were called by telephone at the time of interpretation on 10/01/2016 at 9:30 pm to Dr. Ovid Curd PAGE , who verbally acknowledged these results. Aortic atherosclerosis without aneurysm. Coarse calcification the tail of the pancreas, potentially representing chronic pancreatitis/prior pancreatitis. Centrilobular emphysema with minimal paraseptal emphysema. Coronary artery disease. Signed, Dulcy Fanny. Earleen Newport, DO [facility] Electronically Signed   By: Corrie Mckusick D.O.   On: 10/01/2016 21:34    Microbiology: No results found for this or any previous visit (from the past 240 hour(s)).   Labs: Basic Metabolic Panel:  Recent Labs Lab 10/15/16 0213 10/16/16 1255 10/17/16 0706 10/18/16 0454  NA 140 140 140 139  K 3.3* 3.6 3.6 3.2*  CL 108 107 104 104  CO2 22 22 24  21*  GLUCOSE 111* 103* 95 129*  BUN 7 8 7 8   CREATININE 0.89 0.95 0.95 0.92  CALCIUM 8.8* 9.2 9.2 9.2  MG 2.0 1.9  --   --   PHOS 3.2  --   --   --    Liver Function Tests: No results for input(s): AST, ALT, ALKPHOS, BILITOT, PROT, ALBUMIN in the last 168 hours. No results for input(s): LIPASE, AMYLASE in the last 168 hours. No results for input(s):  AMMONIA in the last 168  hours. CBC:  Recent Labs Lab 10/15/16 0213 10/16/16 1255 10/17/16 0706 10/18/16 0454  WBC 6.5 7.3 5.6 6.7  HGB 10.1* 10.7* 9.8* 9.6*  HCT 33.5* 35.1* 32.4* 31.4*  MCV 80.9 81.6 81.4 80.5  PLT 191 222 203 220   Cardiac Enzymes:  Recent Labs Lab 10/14/16 1220  TROPONINI <0.03   BNP: BNP (last 3 results) No results for input(s): BNP in the last 8760 hours.  ProBNP (last 3 results) No results for input(s): PROBNP in the last 8760 hours.  CBG:  Recent Labs Lab 10/20/16 0018 10/20/16 0420 10/20/16 0716 10/20/16 1139 10/20/16 1556  GLUCAP 122* 107* 135* 106* 147*       SignedDomenic Polite MD.  [facility] 10/21/2016, 11:24 AM

## 2016-10-21 NOTE — Progress Notes (Signed)
CM called and reached daughter of pt [name] who states she called back several times to my phone but I have no calls on my phone nor do I have any record of missed calls from [place]. [name] states she is appealing the discharge and states she knows nothing about IM from previous CMs (though two have been delivered); and requests this CM deliver another IM to the room and she will Appeal.  CM has delivered IM to room and to RN in case family comes to room.

## 2016-10-21 NOTE — Progress Notes (Deleted)
Patient's fiance [name]) is requesting herself to be added in the contact list but patient did not give the authorization.

## 2016-10-21 NOTE — Clinical Social Work Note (Signed)
Clinical Social Worker continuing to follow patient and family for support and discharge planning needs.  CSW has initiated new local SNF search as well as a search relevant to facilities that have a locked unit.  CSW left messages for [facility] and [facility] in [place].  [facility] does not have a bed in their locked unit at this time.  Patient daughter remains adamant that patient will discharge to [facility] in [place], [place].  Facility unable to make a determination about patient potential admission until Monday, 2/12.  FL2 now reflects patient wandering status - facility likely to need updated FL2 prior to patient admission.  CSW did not receive return phone call from patient daughter.  Patient continues to be medically stable for discharge per MD.  CSW remains available for support and to facilitate patient discharge needs once bed available and family agreeable.  Barbette Or, [place]

## 2016-10-21 NOTE — Progress Notes (Signed)
Called and left a message for the CSW about pt disposition.

## 2016-10-22 NOTE — Progress Notes (Signed)
Speech Language Pathology Treatment: Dysphagia  Patient Details Name: Barry Mccoy MRN: [id number] DOB: 1951-07-12 Today's Date: 10/22/2016 Time: [id number] SLP Time Calculation (min) (ACUTE ONLY): 14 min  Assessment / Plan / Recommendation Clinical Impression  Pt required moderate verbal and visual cues to attend to eating/clearing oral cavity. Dentures donned with prolonged mastication and transit with pocketing. Pt speaking while chewing and required mod-max cues to verbalize after oral cavity clear. No s/s aspiration with thin. Continue Dys 2 and thin liquids.    HPI HPI: 66 year old male admitted 10/01/16 due to near syncope, AMS, acute encephalopathy and intermittent hypoxia. Pt intubated 1/24-28/18 due to respiratory distress. UGI 10/04/16 - hypertensive gastropathy vs inflammatory gastritis. CXR 10/07/16 - bilateral airspace disease. PMH unremarkable.      SLP Plan  Continue with current plan of care     Recommendations  Diet recommendations: Dysphagia 2 (fine chop);Thin liquid Liquids provided via: Cup;No straw Medication Administration: Whole meds with puree Supervision: Patient able to self feed;Full supervision/cueing for compensatory strategies Compensations: Slow rate;Small sips/bites;Lingual sweep for clearance of pocketing Postural Changes and/or Swallow Maneuvers: Seated upright 90 degrees                Oral Care Recommendations: Oral care BID Follow up Recommendations: 24 hour supervision/assistance Plan: Continue with current plan of care       GO                Barry Mccoy 10/22/2016, 9:11 AM  Barry Mccoy.Barry Mccoy [employer] [phone]

## 2016-10-22 NOTE — Clinical Social Work Placement (Signed)
   CLINICAL SOCIAL WORK PLACEMENT  NOTE  Date:  10/22/2016  Patient Details  Name: Barry Mccoy MRN: [id number] Date of Birth: July 30, 1951  Clinical Social Work is seeking post-discharge placement for this patient at the [place] level of care (*CSW will initial, date and re-position this form in  chart as items are completed):  Yes   Patient/family provided with [facility] Work Department's list of facilities offering this level of care within the geographic area requested by the patient (or if unable, by the patient's family).  Yes   Patient/family informed of their freedom to choose among providers that offer the needed level of care, that participate in Medicare, Medicaid or managed care program needed by the patient, have an available bed and are willing to accept the patient.  Yes   Patient/family informed of [facility]'s ownership interest in [facility] and [facility], as well as of the fact that they are under no obligation to receive care at these facilities.  PASRR submitted to EDS on 10/15/16     PASRR number received on 10/21/16     Existing PASRR number confirmed on       FL2 transmitted to all facilities in geographic area requested by pt/family on 10/15/16     FL2 transmitted to all facilities within larger geographic area on 10/15/16     Patient informed that his/her managed care company has contracts with or will negotiate with certain facilities, including the following:        Yes   Patient/family informed of bed offers received.  Patient chooses bed at [facility]     Physician recommends and patient chooses bed at      Patient to be transferred to [facility] on 10/23/16.  Patient to be transferred to facility by PTAR     Patient family notified on 10/22/16 of transfer.  Name of family member notified:  Lorenda Hatchet     PHYSICIAN       Additional Comment:     _______________________________________________ Truitt Merle, [place] 10/22/2016, 6:20 PM

## 2016-10-22 NOTE — Clinical Social Work Note (Signed)
CSW spoke with daughter, Darene Lamer. Milas Gain, throughout the day about SNF options in town and in [place]. All out of county SNFs declined. Both daughters are aware and agreeable to discharge plan to [facility]. CSW sent clinicals to [facility] and communicated with Freda Munro (admissions) for room and report. Due to the late hour [facility] asked that pt be transported tomorrow morning. [facility] will be called at [facility] will continue to follow.   Oretha Ellis, MSW, [place] Social Worker  [phone]

## 2016-10-22 NOTE — Progress Notes (Signed)
Physical Therapy Treatment Patient Details Name: Barry Mccoy MRN: [id number] DOB: [birth year] Today's Date: 10/22/2016    History of Present Illness Pt is a 66 yo male admitted for syncope and GI bleeding. Pt found to have Acute Encephalopathy and intermittent hypoxia. VDRF 1/23-1/28. Pt with know ETOH abuse.    PT Comments    Pt admitted with above diagnosis. Pt currently with functional limitations due to balance and endurance deficits. Pt was able to ambulate with supervision without device but with poor overall safety awareness.  Pt was able to self correct with LOB overall.  Belligerent at times.  Will continue PT as pt allows. Pt will benefit from skilled PT to increase their independence and safety with mobility to allow discharge to the venue listed below.    Follow Up Recommendations  SNF;Supervision/Assistance - 24 hour     Equipment Recommendations  Other (comment) (TBD)    Recommendations for Other Services       Precautions / Restrictions Precautions Precautions: Fall Restrictions Weight Bearing Restrictions: No    Mobility  Bed Mobility Overal bed mobility: Needs Assistance Bed Mobility: Supine to Sit;Sit to Sidelying Rolling: Independent Sidelying to sit: Independent Supine to sit: Independent     General bed mobility comments: No cues needed as pt is independent with this task.   Transfers Overall transfer level: Needs assistance Equipment used: None Transfers: Sit to/from Stand Sit to Stand: Supervision         General transfer comment: No assist needed for balance.   Ambulation/Gait Ambulation/Gait assistance: Min guard Ambulation Distance (Feet): 450 Feet Assistive device: None Gait Pattern/deviations: Step-through pattern;Decreased stride length;Trunk flexed;Staggering left;Staggering right   Gait velocity interpretation: <1.8 ft/sec, indicative of risk for recurrent falls General Gait Details: Pt got up quickly off couch and was somewhat  beligerent about walking not wanting gait belt placed even tthough PT explaining why necessary.  Pt lost balance as he ran into the bed but caught himself.  Agreed for PT to place belt on if held lightly.  Pt ambulated in hall with unsteady gait at times with pt staggering occasionally but able to self correct balance.  No LOB that pt did not self correct.  Pt with overall poor awareness but pt would not allow PT to hold onto belt at all times with pt pulling away from therapist much of the time.     Stairs Stairs: Yes   Stair Management: One rail Right;Step to pattern;Alternating pattern;Forwards Number of Stairs: 12 General stair comments: Pt went up the stairs alternating steps with rail and min guard assist as he was going too fast.  Pt went down stairs step to pattern with better safety as he was taking his time more.  Entire time pt complaining that he did not want PT to guard him as he was fine.   Wheelchair Mobility    Modified Rankin (Stroke Patients Only)       Balance Overall balance assessment: Needs assistance Sitting-balance support: Feet supported;No upper extremity supported Sitting balance-Leahy Scale: Fair Sitting balance - Comments: Pt sat EOB with supervision only    Standing balance support: No upper extremity supported;During functional activity Standing balance-Leahy Scale: Fair Standing balance comment: Pt can stand statically with supervision to min guard assist with pt unsafe at timesdue to poor safety awaeness due to confusion.              High level balance activites: Direction changes;Turns;Sudden stops High Level Balance Comments: supervision as pt has would not  allow challenges to balance with pt walking away from staff as he did not want guard assist even though he is unsteady.     Cognition Arousal/Alertness: Awake/alert Behavior During Therapy: Anxious Overall Cognitive Status: Impaired/Different from baseline Area of Impairment: Following  commands;Problem solving;Safety/judgement;Memory;Attention;Orientation Orientation Level: Time;Place;Situation Current Attention Level: Focused Memory: Decreased short-term memory Following Commands: Follows one step commands inconsistently Safety/Judgement: Decreased awareness of safety;Decreased awareness of deficits   Problem Solving: Slow processing;Requires verbal cues;Requires tactile cues;Difficulty sequencing General Comments:   Pt talking about his commander getting his paperwork done.  Pt continues to be confused to place thus decreasing safety as he doesn't understand why "people keep asking me to walk and do stuff and I did all this 2 months ago for all of you."      Exercises      General Comments General comments (skin integrity, edema, etc.): Pt refused to perform exercises.       Pertinent Vitals/Pain Pain Assessment: No/denies pain  VSS  Home Living                      Prior Function            PT Goals (current goals can now be found in the care plan section) Acute Rehab PT Goals Patient Stated Goal: didn't state Progress towards PT goals: Progressing toward goals    Frequency    Min 2X/week      PT Plan Current plan remains appropriate    Co-evaluation             End of Session Equipment Utilized During Treatment: Gait belt Activity Tolerance: Patient limited by fatigue (self limiting) Patient left: with call bell/phone within reach;in chair (sitter in room, daughter arrived from out of town)     Time: 1020-1037 PT Time Calculation (min) (ACUTE ONLY): 17 min  Charges:  $Gait Training: 8-22 mins                    G Codes:      Godfrey Pick Dejean Tribby November 21, 2016, 11:53 AM [facility] Acute Rehabilitation [phone] (pager)

## 2016-10-22 NOTE — Progress Notes (Signed)
No changes from DC summary 2/11 , continues to improve, awaiting SNF  Domenic Polite, MD

## 2016-10-23 ENCOUNTER — Encounter (HOSPITAL_COMMUNITY): Payer: Self-pay

## 2016-10-23 ENCOUNTER — Encounter: Payer: Self-pay | Admitting: Physician Assistant

## 2016-10-23 ENCOUNTER — Inpatient Hospital Stay (HOSPITAL_COMMUNITY)
Admission: EM | Admit: 2016-10-23 | Discharge: 2016-10-27 | Disposition: A | Discharge status: Home / Self Care | Payer: Medicare Other | Source: Home / Self Care | Attending: Family Medicine | Admitting: Family Medicine

## 2016-10-23 DIAGNOSIS — G9340 Encephalopathy, unspecified: Secondary | ICD-10-CM | POA: Diagnosis present

## 2016-10-23 DIAGNOSIS — D649 Anemia, unspecified: Secondary | ICD-10-CM | POA: Diagnosis present

## 2016-10-23 DIAGNOSIS — R339 Retention of urine, unspecified: Secondary | ICD-10-CM | POA: Diagnosis present

## 2016-10-23 DIAGNOSIS — N390 Urinary tract infection, site not specified: Secondary | ICD-10-CM | POA: Diagnosis present

## 2016-10-23 DIAGNOSIS — R410 Disorientation, unspecified: Secondary | ICD-10-CM

## 2016-10-23 DIAGNOSIS — I639 Cerebral infarction, unspecified: Secondary | ICD-10-CM

## 2016-10-23 DIAGNOSIS — N3000 Acute cystitis without hematuria: Secondary | ICD-10-CM

## 2016-10-23 HISTORY — DX: Gastrointestinal hemorrhage, unspecified: K92.2

## 2016-10-23 HISTORY — DX: Depression, unspecified: F32.A

## 2016-10-23 HISTORY — DX: Benign prostatic hyperplasia without lower urinary tract symptoms: N40.0

## 2016-10-23 HISTORY — DX: Alcohol abuse, uncomplicated: F10.10

## 2016-10-23 HISTORY — DX: Major depressive disorder, single episode, unspecified: F32.9

## 2016-10-23 LAB — COMPREHENSIVE METABOLIC PANEL
ALK PHOS: 74 U/L (ref 38–126)
ALT: 28 U/L (ref 17–63)
ANION GAP: 9 (ref 5–15)
AST: 27 U/L (ref 15–41)
Albumin: 4.2 g/dL (ref 3.5–5.0)
BILIRUBIN TOTAL: 0.7 mg/dL (ref 0.3–1.2)
BUN: 16 mg/dL (ref 6–20)
CALCIUM: 9.9 mg/dL (ref 8.9–10.3)
CO2: 26 mmol/L (ref 22–32)
CREATININE: 1.07 mg/dL (ref 0.61–1.24)
Chloride: 103 mmol/L (ref 101–111)
Glucose, Bld: 115 mg/dL — ABNORMAL HIGH (ref 65–99)
Potassium: 3.9 mmol/L (ref 3.5–5.1)
SODIUM: 138 mmol/L (ref 135–145)
TOTAL PROTEIN: 7.7 g/dL (ref 6.5–8.1)

## 2016-10-23 LAB — URINALYSIS, ROUTINE W REFLEX MICROSCOPIC
BILIRUBIN URINE: NEGATIVE
GLUCOSE, UA: NEGATIVE mg/dL
KETONES UR: NEGATIVE mg/dL
Nitrite: POSITIVE — AB
PH: 6 (ref 5.0–8.0)
PROTEIN: 100 mg/dL — AB
SQUAMOUS EPITHELIAL / LPF: NONE SEEN
Specific Gravity, Urine: 1.024 (ref 1.005–1.030)
Tyrosine Crys, UA: POSITIVE

## 2016-10-23 LAB — RAPID URINE DRUG SCREEN, HOSP PERFORMED
Amphetamines: NOT DETECTED
BARBITURATES: NOT DETECTED
BENZODIAZEPINES: POSITIVE — AB
COCAINE: NOT DETECTED
OPIATES: NOT DETECTED
TETRAHYDROCANNABINOL: NOT DETECTED

## 2016-10-23 LAB — CBC WITH DIFFERENTIAL/PLATELET
BASOS ABS: 0.1 10*3/uL (ref 0.0–0.1)
BASOS PCT: 1 %
EOS ABS: 0.5 10*3/uL (ref 0.0–0.7)
Eosinophils Relative: 5 %
HEMATOCRIT: 34.8 % — AB (ref 39.0–52.0)
HEMOGLOBIN: 10.8 g/dL — AB (ref 13.0–17.0)
Lymphocytes Relative: 47 %
Lymphs Abs: 5 10*3/uL — ABNORMAL HIGH (ref 0.7–4.0)
MCH: 24.9 pg — ABNORMAL LOW (ref 26.0–34.0)
MCHC: 31 g/dL (ref 30.0–36.0)
MCV: 80.2 fL (ref 78.0–100.0)
Monocytes Absolute: 0.5 10*3/uL (ref 0.1–1.0)
Monocytes Relative: 5 %
NEUTROS ABS: 4.3 10*3/uL (ref 1.7–7.7)
NEUTROS PCT: 42 %
Platelets: 270 10*3/uL (ref 150–400)
RBC: 4.34 MIL/uL (ref 4.22–5.81)
RDW: 18.5 % — AB (ref 11.5–15.5)
WBC: 10.3 10*3/uL (ref 4.0–10.5)

## 2016-10-23 MED ORDER — SERTRALINE HCL 50 MG PO TABS
50.0000 mg | ORAL_TABLET | Freq: Every day | ORAL | Status: DC
  Administered 2016-10-23 – 2016-10-26 (×4): 50 mg via ORAL
  Filled 2016-10-23 (×4): qty 1

## 2016-10-23 MED ORDER — METOPROLOL TARTRATE 25 MG PO TABS
25.0000 mg | ORAL_TABLET | Freq: Two times a day (BID) | ORAL | Status: DC
  Administered 2016-10-23 – 2016-10-27 (×8): 25 mg via ORAL
  Filled 2016-10-23 (×8): qty 1

## 2016-10-23 MED ORDER — POLYETHYLENE GLYCOL 3350 17 G PO PACK
17.0000 g | PACK | Freq: Every day | ORAL | Status: DC | PRN
  Filled 2016-10-23: qty 1

## 2016-10-23 MED ORDER — PANTOPRAZOLE SODIUM 40 MG PO TBEC
40.0000 mg | DELAYED_RELEASE_TABLET | Freq: Every day | ORAL | Status: DC
  Administered 2016-10-23 – 2016-10-27 (×5): 40 mg via ORAL
  Filled 2016-10-23 (×5): qty 1

## 2016-10-23 MED ORDER — ACETAMINOPHEN 325 MG PO TABS: 650.0000 mg | ORAL_TABLET | Freq: Four times a day (QID) | ORAL | Status: DC | PRN

## 2016-10-23 MED ORDER — BISACODYL 5 MG PO TBEC
5.0000 mg | DELAYED_RELEASE_TABLET | Freq: Every day | ORAL | Status: DC | PRN
  Filled 2016-10-23: qty 1

## 2016-10-23 MED ORDER — ONDANSETRON HCL 4 MG PO TABS: 4.0000 mg | ORAL_TABLET | Freq: Four times a day (QID) | ORAL | Status: DC | PRN

## 2016-10-23 MED ORDER — TAMSULOSIN HCL 0.4 MG PO CAPS
0.4000 mg | ORAL_CAPSULE | Freq: Every day | ORAL | Status: DC
  Administered 2016-10-23 – 2016-10-26 (×4): 0.4 mg via ORAL
  Filled 2016-10-23 (×5): qty 1

## 2016-10-23 MED ORDER — HALOPERIDOL 1 MG PO TABS
1.0000 mg | ORAL_TABLET | Freq: Three times a day (TID) | ORAL | Status: DC | PRN
  Administered 2016-10-24: 1 mg via ORAL
  Filled 2016-10-23 (×2): qty 1

## 2016-10-23 MED ORDER — ADULT MULTIVITAMIN W/MINERALS CH
1.0000 | ORAL_TABLET | Freq: Every day | ORAL | Status: DC
  Administered 2016-10-23 – 2016-10-26 (×5): 1 via ORAL
  Filled 2016-10-23 (×4): qty 1

## 2016-10-23 MED ORDER — ENOXAPARIN SODIUM 40 MG/0.4ML ~~LOC~~ SOLN
40.0000 mg | Freq: Every day | SUBCUTANEOUS | Status: DC
  Administered 2016-10-23 – 2016-10-26 (×4): 40 mg via SUBCUTANEOUS
  Filled 2016-10-23 (×5): qty 0.4

## 2016-10-23 MED ORDER — ONDANSETRON HCL 4 MG/2ML IJ SOLN: 4.0000 mg | Freq: Four times a day (QID) | INTRAMUSCULAR | Status: DC | PRN

## 2016-10-23 MED ORDER — DEXTROSE 5 % IV SOLN
1.0000 g | Freq: Once | INTRAVENOUS | Status: AC
  Administered 2016-10-23: 1 g via INTRAVENOUS
  Filled 2016-10-23 (×2): qty 10

## 2016-10-23 MED ORDER — TRAZODONE HCL 100 MG PO TABS
200.0000 mg | ORAL_TABLET | Freq: Every day | ORAL | Status: DC
  Administered 2016-10-23 – 2016-10-24 (×2): 200 mg via ORAL
  Filled 2016-10-23 (×2): qty 2

## 2016-10-23 MED ORDER — NICOTINE 14 MG/24HR TD PT24
14.0000 mg | MEDICATED_PATCH | Freq: Every day | TRANSDERMAL | Status: DC
  Administered 2016-10-24 – 2016-10-27 (×4): 14 mg via TRANSDERMAL
  Filled 2016-10-23 (×4): qty 1

## 2016-10-23 MED ORDER — ASPIRIN EC 81 MG PO TBEC
81.0000 mg | DELAYED_RELEASE_TABLET | Freq: Every day | ORAL | Status: DC
  Administered 2016-10-23 – 2016-10-27 (×5): 81 mg via ORAL
  Filled 2016-10-23 (×6): qty 1

## 2016-10-23 MED ORDER — HYDROCODONE-ACETAMINOPHEN 5-325 MG PO TABS: 1.0000 | ORAL_TABLET | Freq: Four times a day (QID) | ORAL | Status: DC | PRN

## 2016-10-23 MED ORDER — TRAMADOL HCL ER 300 MG PO TB24: 300.0000 mg | ORAL_TABLET | Freq: Every day | ORAL | Status: DC

## 2016-10-23 MED ORDER — BACLOFEN 20 MG PO TABS: 20.0000 mg | ORAL_TABLET | Freq: Two times a day (BID) | ORAL | Status: DC | PRN

## 2016-10-23 MED ORDER — ACETAMINOPHEN 650 MG RE SUPP: 650.0000 mg | Freq: Four times a day (QID) | RECTAL | Status: DC | PRN

## 2016-10-23 MED ORDER — BACLOFEN 20 MG PO TABS
20.0000 mg | ORAL_TABLET | Freq: Every day | ORAL | Status: DC
  Administered 2016-10-23 – 2016-10-26 (×4): 20 mg via ORAL
  Filled 2016-10-23 (×4): qty 1

## 2016-10-23 MED ORDER — SODIUM CHLORIDE 0.9% FLUSH
3.0000 mL | Freq: Two times a day (BID) | INTRAVENOUS | Status: DC
  Administered 2016-10-24 – 2016-10-27 (×4): 3 mL via INTRAVENOUS

## 2016-10-23 MED ORDER — SODIUM CHLORIDE 0.9 % IV SOLN: 250.0000 mL | INTRAVENOUS | Status: DC | PRN

## 2016-10-23 MED ORDER — SODIUM CHLORIDE 0.9% FLUSH
3.0000 mL | Freq: Two times a day (BID) | INTRAVENOUS | Status: DC
  Administered 2016-10-23 – 2016-10-26 (×3): 3 mL via INTRAVENOUS

## 2016-10-23 MED ORDER — CEFTRIAXONE SODIUM 1 G IJ SOLR
1.0000 g | INTRAMUSCULAR | Status: DC
  Administered 2016-10-24 – 2016-10-25 (×2): 1 g via INTRAVENOUS
  Filled 2016-10-23 (×2): qty 10

## 2016-10-23 MED ORDER — POTASSIUM CHLORIDE CRYS ER 20 MEQ PO TBCR
30.0000 meq | EXTENDED_RELEASE_TABLET | Freq: Once | ORAL | Status: AC
  Administered 2016-10-23: 30 meq via ORAL
  Filled 2016-10-23: qty 1

## 2016-10-23 MED ORDER — DIAZEPAM 5 MG PO TABS
5.0000 mg | ORAL_TABLET | Freq: Three times a day (TID) | ORAL | Status: DC | PRN
  Administered 2016-10-23 – 2016-10-24 (×2): 5 mg via ORAL
  Filled 2016-10-23 (×2): qty 1

## 2016-10-23 MED ORDER — SODIUM CHLORIDE 0.9% FLUSH: 3.0000 mL | INTRAVENOUS | Status: DC | PRN

## 2016-10-23 MED ORDER — QUETIAPINE FUMARATE 25 MG PO TABS
50.0000 mg | ORAL_TABLET | Freq: Every day | ORAL | Status: DC
  Administered 2016-10-23 – 2016-10-26 (×4): 50 mg via ORAL
  Filled 2016-10-23 (×4): qty 2

## 2016-10-23 MED ORDER — MAGNESIUM SULFATE IN D5W 1-5 GM/100ML-% IV SOLN
1.0000 g | Freq: Once | INTRAVENOUS | Status: AC
  Administered 2016-10-23: 1 g via INTRAVENOUS
  Filled 2016-10-23: qty 100

## 2016-10-23 MED ORDER — VITAMIN D 1000 UNITS PO TABS
5000.0000 [IU] | ORAL_TABLET | Freq: Every day | ORAL | Status: DC
  Administered 2016-10-23 – 2016-10-26 (×4): 5000 [IU] via ORAL
  Filled 2016-10-23 (×4): qty 5

## 2016-10-23 MED ORDER — VITAMIN B-1 100 MG PO TABS
100.0000 mg | ORAL_TABLET | Freq: Every day | ORAL | Status: DC
Start: 2016-10-23 — End: 2016-10-27
  Administered 2016-10-23 – 2016-10-27 (×5): 100 mg via ORAL
  Filled 2016-10-23 (×4): qty 1

## 2016-10-23 NOTE — Discharge Summary (Signed)
Physician Discharge Summary  Barry Mccoy [name] DOB: 05/25/1951 DOA: 10/01/2016  PCP: No PCP Per Patient  Admit date: 10/01/2016 Discharge date: 10/23/2016  Time spent: 45 minutes   Recommendations for Outpatient Follow-up:  1. Needs Neuro Cognitive testing down the road, wean off low dose Seroquel and Haldol PRN as tolerated 2. Foley Care, Voiding trial in about 1 week, will need Urology referral if fails this 3. Needs MRI Abd in 1-65months to re-evaluate possible Pancreatic cyst noted on CT abdomen 4. Needs repeat SLP eval at SNF in few days and upgrading diet 5. GI Dr.Hung in 38month for GI bleed and Pancreatic cystic lesion  Discharge Diagnoses:  Principal Problem:   Acute GI bleeding   Symptomatic anemia   Delirium/AGitation   ICU Delirium   Alcohol withdrawal   Acute respiratory failure (HCC)   Severe single current episode of major depressive disorder, with psychotic features ([place])   Acute encephalopathy   Cirrhosis on Imaging   Alcohol use disorder     Discharge Condition: improved  Diet recommendation: Dysphagia 2 diet with thin liquids       Filed Weights   10/17/16 0319 10/18/16 0553 10/20/16 2019  Weight: 97.1 kg (214 lb 1.1 oz) 91.7 kg (202 lb 2.6 oz) 91.4 kg (201 lb 8 oz)    History of present illness:  66 year old man with past medical history of migraine headaches (takes keppra), depression who presented to [facility] 10/01/16 after nearly passing out while out shopping. Pt also reported exertional dyspnea, black stools. He was hypotensive in ED and it was thought that this is due to possible GI bleed. He was transfused total of 5 units PRBC during this admission. He developed acute delirium and agitation.   Hospital Course:  66 year old man with past medical history of migraine headaches (takes keppra), depression who presented to [facility] 10/01/16 after nearly passing out while out shopping. Pt also reported exertional dyspnea, black stools. He was hypotensive  in ED and it was thought that this is due to possible GI bleed. He was transfused total of 5 units PRBC during this admission. He developed acute delirium and agitation requiring high does of Ativan resulted in somnolence and acute respiratory failure with hypoxia and apnea. He was intubated from 1/23 - 1/28. Required precedex infusion, fentanyl and Versed gtt in ICU. He was also started on Seroquel by PCCM.He also had afib/SVT/sinus tachycardia >>placed on amio IV and then converted to sinus tachycardia. Transferred from PCCM to [facility] on 10/12/16 -agitation/delirium continues to be an issue but finally improving, benzos being weaned and Seroquel being weaned off too. Has also required restraints now off  Detailed Plan:  Acute respiratory failure with hypoxia and inability to protect airways / Aspiration pneumonitis - due to severe agitation and requiring excessive doses of sedatives - Intubated form 1/23 through 1/28 - Has received total of 7 days of zosyn and needs no further antibiotics at this time.  - weaned off O2, resolved  Delirium secondary to alcohol abuse and withdrawal, ICU delirium, meds etc -severe agitation and delirium noted after admsision -Ct head on admission unremarkable and EEG 1/25 suggestive of encephalopathy, no seizure activity. -H/o EToh use, lives alone, per daughter drinks couple of drinks daily, suspect this is more, given portal gastropathy on EGD and Cirrhosis on CT abd -was in ICU, required sedation and intubation, using Precedex drip , was also on fentanyl and versed gtt. - started onseroquel 200 mg BID in ICU by PCCM due to agitationbut concern that  this med may have cause SVT. Seroquel dose cut to 100 mg BID on 2/1, now tapered down to 50mg  QHS,  remains on Zoloft 50 mg daily which he takes at home. - Patient noted overnight 2/3 to 2/4 to have hallucinations, delusions, combative, verbally abusive and striking out at staff members.4 point restraints placed,  now off -seen by Psych, recommended Haldol PRN and decreasing benzos and Seroquel at bedtime -I started cutting down Clonazepam dose day before now stopped. -Seen by [facility], recommended high dose thiamine for few days and recommended stopping Keppra since he had no clear indication for being on this. -plan for discharge to SNF and then Neurocognitive testing down the road, please wean off Seroquel slowly and Haldol at SNF, off all restraints since Friday 2/9  Urinary retention -failed voiding trial yesterday, foley replaced, continue FLomax -voiding trial in 1 week at SNF, Urology Follow up  Pneumoperitoneum - Noted on CT scan 2/4 - Surgery has seen in consultation; benign abd exam, no evidence of evolving sepsis so surgery not required at this time. Per Dr.Blackman, uncertain of the cause of the free air or how long he has had it. He may have had an intra-abdominal event that is now walled itself off.  -no abd symptoms, tolerating diet  Anemia of chronic disease / Acute upper GI bleed - Likely related to portal gastropathy.  - S/P EGD 10/04/2016--with portal gastropathy  - S/P 5 units PRBC transfusion  - now Hb stable  Suspected Cirrhosis on CT Abd -likely due to ETOH, EGD with portal gastropathy noted, s/o of chronic liver disease -advised FU with GI  Hypokalemia - Due to alcohol abuse - Supplemented  SVT - while in ICU,  CTA negative for PE - Started on amiodarone drip on 2/1 but he is now in sinus rhythm  - D/C'ed amio 2/1, started low dose metoprolol  AKI - Resolvedwith fluids - Cr WNL  Suspected pseudocyst in the pancreas -needs Outpatient MRI abd to re-evaluate pancreas/cyst etc  Consultations:  Neurology  GI  PCCM    Discharge Exam: Vitals:   10/22/16 2242 10/23/16 0613  BP: 113/65 130/81  Pulse: 73 (!) 137  Resp:    Temp: 98.3 F (36.8 C) 98.4 F (36.9 C)     General: alert, awake, intermittently confused, oriented to self, partly to  place Cardiovascular: S!S2/RRR Respiratory: CTAB   Discharge Instructions   Discharge Instructions    Discharge instructions    Complete by:  As directed    Dysphagia 2 diet, nectar thick liquids   Increase activity slowly    Complete by:  As directed      Current Discharge Medication List    START taking these medications   Details  haloperidol (HALDOL) 1 MG tablet Take 1 tablet (1 mg total) by mouth every 8 (eight) hours as needed for agitation. Qty: 15 tablet, Refills: 0    metoprolol tartrate (LOPRESSOR) 25 MG tablet Take 1 tablet (25 mg total) by mouth 2 (two) times daily.    nicotine (NICODERM CQ - DOSED IN MG/24 HOURS) 14 mg/24hr patch Place 1 patch (14 mg total) onto the skin daily. Qty: 28 patch, Refills: 0    pantoprazole (PROTONIX) 40 MG tablet Take 1 tablet (40 mg total) by mouth daily.    QUEtiapine (SEROQUEL) 50 MG tablet Take 1 tablet (50 mg total) by mouth at bedtime.    thiamine 100 MG tablet Take 4 tablets (400 mg total) by mouth daily. For 4days then down  to 100mg  daily      CONTINUE these medications which have CHANGED   Details  baclofen (LIORESAL) 20 MG tablet Take 1 tablet (20 mg total) by mouth at bedtime. Refills: 0    traZODone (DESYREL) 100 MG tablet Take 2 tablets (200 mg total) by mouth at bedtime.      CONTINUE these medications which have NOT CHANGED   Details  aspirin EC 81 MG tablet Take 81 mg by mouth daily.    Cholecalciferol (VITAMIN D-3) 5000 units TABS Take 5,000 Units by mouth at bedtime.    lidocaine (LIDODERM) 5 % Place 2 patches onto the skin daily as needed (pain). Remove & Discard patch within 12 hours or as directed by MD    Multiple Vitamin (MULTIVITAMIN WITH MINERALS) TABS tablet Take 1 tablet by mouth at bedtime.    OnabotulinumtoxinA (BOTOX IJ) Inject as directed See admin instructions. Botox injections done at Dr. Audery Amel office approximately every 90 days    sertraline (ZOLOFT) 50 MG tablet Take 50 mg by mouth  at bedtime.    tamsulosin (FLOMAX) 0.4 MG CAPS capsule Take 0.4 mg by mouth at bedtime.      STOP taking these medications     levETIRAcetam (KEPPRA) 500 MG tablet      NON FORMULARY      diphenhydrAMINE (BENADRYL) 25 MG tablet      loratadine (CLARITIN) 10 MG tablet      naproxen sodium (ALEVE) 220 MG tablet      OVER THE COUNTER MEDICATION      OVER THE COUNTER MEDICATION      OVER THE COUNTER MEDICATION      promethazine (PHENERGAN) 25 MG tablet        Allergies  Allergen Reactions  . Shellfish Allergy Other (See Comments)    Unknown     Contact information for follow-up providers    pcp. Schedule an appointment as soon as possible for a visit in 1 week(s).            Contact information for after-discharge care    [facility] .   Specialty:  [place] information: [place] [phone]                   The results of significant diagnostics from this hospitalization (including imaging, microbiology, ancillary and laboratory) are listed below for reference.    Significant Diagnostic Studies: Ct Head Wo Contrast  Result Date: 10/14/2016 CLINICAL DATA:  Altered mental status. EXAM: CT HEAD WITHOUT CONTRAST TECHNIQUE: Contiguous axial images were obtained from the base of the skull through the vertex without intravenous contrast. COMPARISON:  10/03/2016 FINDINGS: Brain: No evidence of acute infarction, hemorrhage, hydrocephalus, or mass lesion/mass effect. Small hygroma posterior to the left cerebellum is stable at 4 mm maximal thickness. No significant mass effect. Stable pattern of mild chronic microvascular disease. Dilated perivascular space versus remote lacune along the lower right putamen. Vascular: Atherosclerotic calcification.  No hyperdense vessel. Skull: No acute finding Sinuses/Orbits: Negative IMPRESSION: No acute finding or change from prior.  Electronically Signed   By: Monte Fantasia M.D.   On: 10/14/2016 16:04   Ct Head Wo Contrast  Result Date: 10/03/2016 CLINICAL DATA:  66 year old male with acute encephalopathy. EXAM: CT HEAD WITHOUT CONTRAST TECHNIQUE: Contiguous axial images were obtained from the base of the skull through the vertex without intravenous contrast. COMPARISON:  None. FINDINGS: Brain: The ventricles and  sulci appropriate in size for patient's age. Mild periventricular and deep white matter chronic microvascular ischemic changes noted. A focal area of hypodensity in the right occipital subcortical white matter likely represent chronic microvascular ischemic changes. 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 There is no acute intracranial hemorrhage. No mass effect or midline shift noted. No extra-axial fluid collection. Vascular: No hyperdense vessel or unexpected calcification. Skull: Normal. Negative for fracture or focal lesion. Sinuses/Orbits: No acute finding. Other: Partially visualized support tubes in the mouth. IMPRESSION: No acute intracranial hemorrhage. Mild chronic microvascular ischemic changes. If symptoms persist and there are no contraindications, MRI may provide better evaluation if clinically indicated. Electronically Signed   By: Anner Crete M.D.   On: 10/03/2016 01:28   Ct Angio Chest Pe W Or Wo Contrast  Result Date: 10/14/2016 CLINICAL DATA:  Hypoxia, agitation, SVT, elevated D-dimer. EXAM: CT ANGIOGRAPHY CHEST WITH CONTRAST TECHNIQUE: Multidetector CT imaging of the chest was performed using the standard protocol during bolus administration of intravenous contrast. Multiplanar CT image reconstructions and MIPs were obtained to evaluate the vascular anatomy. CONTRAST:  100 cc Isovue 370 COMPARISON:  None. FINDINGS: Cardiovascular: Beam hardening artifact, presumably from patient's arms, considerably limits characterization of the peripheral segmental and subsegmental pulmonary artery  branches to the lower lobes bilaterally. I cannot exclude small peripheral pulmonary embolism. There is no pulmonary embolism identified within the main, lobar or central segmental pulmonary arteries bilaterally. Heart size is normal. No pericardial effusion. No aortic aneurysm or dissection. Mild atherosclerotic changes noted along the walls of the descending thoracic aorta. Coronary artery calcifications noted. Mediastinum/Nodes: No mass or enlarged lymph nodes within the mediastinum or perihilar regions. Esophagus appears normal. Trachea and central bronchi are unremarkable. Lungs/Pleura: Small right pleural effusion with adjacent compressive atelectasis. Mild atelectasis at the left lung base. Hazy ground-glass opacities within the upper lobes bilaterally, likely additional atelectasis or perhaps mild edema. No pneumothorax. 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 Upper Abdomen: Free intraperitoneal air. Small amount of free fluid in the left upper quadrant. Mass versus fluid collection adjacent to the pancreatic tail, incompletely imaged. Multiple gallstones within the gallbladder, incompletely imaged. Musculoskeletal: No acute or suspicious osseous lesion. Mild degenerative spurring within the thoracic spine. Superficial soft tissues are unremarkable. Review of the MIP images confirms the above findings. IMPRESSION: 1. Free intraperitoneal air within the upper abdomen. In the absence of a recent surgery, this suggests viscus perforation. CT abdomen and pelvis is recommended for further characterization. 2. No pulmonary embolism seen, with study limitations detailed above. 3. Small right pleural effusion. Mild bibasilar atelectasis. Mild atelectasis versus edema within the upper lobes bilaterally. No evidence of pneumonia. 4. Free fluid in the left upper abdomen. Mass versus fluid collection adjacent to the pancreatic tail, incompletely imaged. Cholelithiasis, incompletely  imaged. 5. Aortic atherosclerosis. Critical Value/emergent results were called by telephone at the time of interpretation on 10/14/2016 at 4:25 pm to Dr. Irine Seal , who verbally acknowledged these results. Electronically Signed   By: Franki Cabot M.D.   On: 10/14/2016 16:29   Ct Abdomen Pelvis W Contrast  Addendum Date: 10/14/2016   ADDENDUM REPORT: 10/14/2016 21:30 ADDENDUM: Findings discussed with K. Schorr 10/14/2016  at 9:30 pm. Electronically Signed   By: Monte Fantasia M.D.   On: 10/14/2016 21:30   Result Date: 10/14/2016 CLINICAL DATA:  Follow-up chest CT.  Free air. EXAM: CT ABDOMEN AND PELVIS WITH CONTRAST TECHNIQUE: Multidetector CT imaging of the abdomen and pelvis was  performed using the standard protocol following bolus administration of intravenous contrast. CONTRAST:  83mL ISOVUE-300 IOPAMIDOL (ISOVUE-300) INJECTION 61% COMPARISON:  None. FINDINGS: Lower chest: Described separately. Small pleural effusions and atelectasis Hepatobiliary: Low-density along the falciform ligament is likely perfusion anomaly. Cannot exclude cirrhosis. The liver surface is lobulated. Cholelithiasis. No signs of acute cholecystitis. Pancreas: Cystic density of the pancreatic tail measuring 26 mm. No surrounding inflammatory changes. There is a coarse calcification and neighboring pancreatic tail which is somewhat full. Some this fullness may be related to venous collaterals. Spleen: Enlarged this 17 cm span. This is likely related to chronic splenic vein occlusion. There are well-formed venous collaterals, including proximal gastric varices. Adrenals/Urinary Tract: Negative adrenals. No hydronephrosis or stone. Urinary bladder predominately decompressed by Foley catheter. Stomach/Bowel: Numerous colonic diverticula. There is question of diverticulitis at the splenic flexure, but no noted gas in this region to explain pneumoperitoneum. No inflammatory changes noted around the stomach or proximal duodenum. No visible  ulcer. Proximal gastric varices. Negative appendix. Vascular/Lymphatic: No acute vascular abnormality. Splenic vein occlusion as described. Extensive atherosclerosis. No mass or adenopathy. Reproductive:No pathologic findings. Other: Moderate volume pneumoperitoneum, maximal in the upper ventral abdomen, but also present in the left lower quadrant. Musculoskeletal:  Degenerative changes.  No acute abnormalities. Findings are known based on previous recorded communications. Mid level has been paged to make aware of the completed scan. IMPRESSION: 1. No definitive source for the patient's moderate pneumoperitoneum. Given there is no inflammatory changes or visible ulcer along the stomach or proximal duodenum, a diverticular source is favored. There may be mild diverticulitis along the splenic flexure, but no extraluminal gas in this region. 2. 26 mm fluid collection in the splenic fossa. Suspect pseudocyst as there is ductal or parenchymal calcification in the pancreatic tail. Pancreatic tail is full and pancreas protocol CT or MRI follow-up is recommended after convalescence to exclude mass. 3. 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 Patient was admitted with GI bleeding. 4. Cholelithiasis. 5. Suspected cirrhosis. 6. Splenomegaly. Electronically Signed: By: Monte Fantasia M.D. On: 10/14/2016 21:19   Dg Chest Port 1 View  Result Date: 10/11/2016 CLINICAL DATA:  Respiratory failure, GI bleeding, encephalopathy, current smoker. EXAM: PORTABLE CHEST 1 VIEW COMPARISON:  Portable chest x-ray of October 07, 2016 FINDINGS: The lungs are well-expanded. The interstitial markings remain increased. The hemidiaphragms are now all well demonstrated. Basilar parenchymal density has markedly improved. The heart remains mildly enlarged. The central pulmonary vascularity remains engorged. The trachea is midline. The bony thorax exhibits no acute abnormality. IMPRESSION: Improved bibasilar  atelectasis or pneumonia. Persistent pulmonary interstitial edema and pulmonary vascular congestion consistent with CHF. Electronically Signed   By: David  Martinique M.D.   On: 10/11/2016 07:06   Dg Chest Port 1 View  Result Date: 10/07/2016 CLINICAL DATA:  Acute respiratory failure EXAM: PORTABLE CHEST 1 VIEW COMPARISON:  10/06/2016 FINDINGS: Support devices are unchanged. Mild cardiomegaly. Bilateral airspace opacities and probable layering effusions. Overall aeration may have decreased slightly since prior study. IMPRESSION: Bilateral airspace disease and layering effusions, slightly worsened since prior study. Electronically Signed   By: Rolm Baptise M.D.   On: 10/07/2016 07:20   Dg Chest Port 1 View  Result Date: 10/06/2016 CLINICAL DATA:  Endotracheal tube EXAM: PORTABLE CHEST 1 VIEW COMPARISON:  10/05/2016 FINDINGS: Endotracheal tube 2 cm above the carina. Right jugular central venous catheter tip in the SVC. Feeding tube in place with the tip not visualized Bibasilar airspace disease similar to yesterday. Small  right effusion. No pneumothorax IMPRESSION: Endotracheal tube 2 cm above the carina Bibasilar airspace disease unchanged from the prior study. Electronically Signed   By: Franchot Gallo M.D.   On: 10/06/2016 07:08   Dg Chest Port 1 View  Result Date: 10/05/2016 CLINICAL DATA:  Check endotracheal tube EXAM: PORTABLE CHEST 1 VIEW COMPARISON:  10/04/2016 FINDINGS: Endotracheal tube and right jugular central line are again seen and stable. Cardiac shadow is within normal limits. Patients significant rotated to the right accentuating the mediastinal markings. Small bilateral pleural effusions are noted. No other focal infiltrate is seen. IMPRESSION: Endotracheal tube in satisfactory position. Small bilateral pleural effusions are noted. Electronically Signed   By: Inez Catalina M.D.   On: 10/05/2016 08:44   Dg Chest Port 1 View  Result Date: 10/04/2016 CLINICAL DATA:  Post central line  placement EXAM: PORTABLE CHEST 1 VIEW COMPARISON:  Portable exam 1232 hours compared to 10/07/2016 at 0524 hours FINDINGS: Tip of endotracheal tube projects 7.0 cm above carina. New RIGHT jugular central venous catheter with tip projecting over SVC. Stable heart size and mediastinal contours. Bronchitic changes and accentuated perihilar markings stable. Bibasilar atelectasis. LEFT lateral costophrenic angle excluded. No new infiltrate or pneumothorax. Bones demineralized. IMPRESSION: No pneumothorax following RIGHT jugular line placement. Bronchitic changes with bibasilar atelectasis. Electronically Signed   By: Lavonia Dana M.D.   On: 10/04/2016 12:48   Dg Chest Port 1 View  Result Date: 10/04/2016 CLINICAL DATA:  Intubation. EXAM: PORTABLE CHEST 1 VIEW COMPARISON:  10/03/2016. FINDINGS: Endotracheal tube in stable position. Stable cardiomegaly. No pulmonary venous congestion. Progressive dense right lower lobe atelectasis. Mild left base subsegmental atelectasis. Small bilateral pleural effusions cannot be excluded. IMPRESSION: 1. Endotracheal tube in stable position. 2. Low lung volumes. Progressive dense right lower lobe atelectasis. Improved aeration of the left base with mild residual infiltrate/edema. 3. Small bilateral pleural effusions . Electronically Signed   By: Marcello Moores  Register   On: 10/04/2016 07:09   Dg Chest Port 1 View  Result Date: 10/03/2016 CLINICAL DATA:  Hypoxia, intubated, smoker EXAM: PORTABLE CHEST 1 VIEW COMPARISON:  Portable exam 0901 hours compared to 10/02/2016 FINDINGS: Tip of endotracheal tube projects 3.3 cm above carina. Enlargement of cardiac silhouette. Mediastinal contours and pulmonary vascularity normal. Persistent LEFT lower lobe infiltrate question pneumonia. Central peribronchial thickening with question minimal atelectasis or infiltrate at RIGHT base. Upper lungs clear. No definite pleural effusion or pneumothorax. IMPRESSION: Persistent LEFT lower lobe infiltrate  with question minimal atelectasis versus infiltrate RIGHT base. Electronically Signed   By: Lavonia Dana M.D.   On: 10/03/2016 09:11   Portable Chest Xray  Result Date: 10/02/2016 CLINICAL DATA:  Hypoxia EXAM: PORTABLE CHEST 1 VIEW COMPARISON:  Chest CT October 01, 2016 FINDINGS: Endotracheal tube tip is 2.5 cm above the carina. Nasogastric tube tip and side port are below the diaphragm. No pneumothorax. There is patchy consolidation in the left lower lobe. Lungs elsewhere are clear. There is mild cardiomegaly with pulmonary venous hypertension. No evident adenopathy. No bone lesions. IMPRESSION: Tube positions as described without pneumothorax. Left lower lobe consolidation, likely pneumonia. There is cardiomegaly with pulmonary venous hypertension indicative of a degree of pulmonary vascular congestion. Electronically Signed   By: Lowella Grip III M.D.   On: 10/02/2016 17:30   Dg Chest Port 1v Same Day  Result Date: 10/04/2016 CLINICAL DATA:  Evaluate ET tube placement. EXAM: PORTABLE CHEST 1 VIEW COMPARISON:  Chest radiograph earlier same day. FINDINGS: ET tube terminates in the mid to  distal trachea. Monitoring leads overlie the patient. Right IJ central venous catheter tip projects over the superior vena cava. Stable cardiac and mediastinal contours, enlarged. Grossly unchanged mid lower lung airspace opacities. Probable small bilateral layering pleural effusions. IMPRESSION: ET tube terminates in the mid to distal trachea. Electronically Signed   By: Lovey Newcomer M.D.   On: 10/04/2016 20:24   Dg Chest Port 1v Same Day  Result Date: 10/04/2016 CLINICAL DATA:  Patient status post intubation. EXAM: PORTABLE CHEST 1 VIEW COMPARISON:  Chest radiograph 10/04/2016. FINDINGS: Right IJ central venous catheter tip projects over the superior vena cava. The ET tube is visualized to the distal trachea. The distal aspect of the ET tube is not well demonstrated. Monitoring leads overlie the patient. Stable  enlarged cardiac and mediastinal contours. Grossly unchanged bilateral mid and lower lung, right-greater-than-left heterogeneous opacities. Possible small layering bilateral pleural effusions. IMPRESSION: ET tube is visualized to the distal trachea. Given technique, the distal aspect of the ET tube is not demonstrated. Recommend repeat evaluation. Re- demonstrated bilateral mid and lower lung heterogeneous opacities. These results will be called to the ordering clinician or representative by the Radiologist Assistant, and communication documented in the PACS or zVision Dashboard. Electronically Signed   By: Lovey Newcomer M.D.   On: 10/04/2016 18:53   Dg Abd Portable 1v  Result Date: 10/08/2016 CLINICAL DATA:  Feeding tube placement. EXAM: PORTABLE ABDOMEN - 1 VIEW COMPARISON:  10/05/2016 FINDINGS: The feeding tube tip is at the junction of the second and third portions of the duodenum. Air throughout the small bowel and colon suggesting an ileus. IMPRESSION: Feeding tube tip is in the distal descending duodenum. Electronically Signed   By: Marijo Sanes M.D.   On: 10/08/2016 12:53   Dg Abd Portable 1v  Result Date: 10/05/2016 CLINICAL DATA:  Feeding tube placement EXAM: PORTABLE ABDOMEN - 1 VIEW COMPARISON:  10/03/2016 FINDINGS: Feeding tube tip is in the descending duodenum. IMPRESSION: Feeding tube tip in the descending duodenum. Electronically Signed   By: Rolm Baptise M.D.   On: 10/05/2016 11:30   Dg Abd Portable 1v  Result Date: 10/03/2016 CLINICAL DATA:  Assess orogastric tube placement. EXAM: PORTABLE ABDOMEN - 1 VIEW COMPARISON:  Abdominal radiograph of October 02, 2016 FINDINGS: The orogastric tube tip lies in the mid gastric body. The proximal port lies just below the GE junction. The observed bowel gas pattern is unremarkable. IMPRESSION: Positioning of the orogastric tube since at the proximal port is just below the expected location of the GE junction. Advancement by 5-10 cm is recommended to  assure that the proximal port remains below the GE junction. Electronically Signed   By: David  Martinique M.D.   On: 10/03/2016 07:57   Dg Abd Portable 1v  Result Date: 10/02/2016 CLINICAL DATA:  Nasogastric tube placement EXAM: PORTABLE ABDOMEN - 1 VIEW COMPARISON:  None. FINDINGS: Nasogastric tube tip and side port are in the stomach. There is no bowel dilatation or air-fluid level suggesting bowel obstruction. No free air. IMPRESSION: Nasogastric tube tip and side port in stomach. Bowel gas pattern unremarkable. Electronically Signed   By: Lowella Grip III M.D.   On: 10/02/2016 17:28   Dg Swallowing Func-speech Pathology  Result Date: 10/20/2016 Objective Swallowing Evaluation: Type of Study: MBS-Modified Barium Swallow Study Patient Details Name: Barry Mccoy MRN: [id number] Date of Birth: Sep 27, 1950 Today's Date: 10/20/2016 Time: SLP Start Time (ACUTE ONLY): 1110-SLP Stop Time (ACUTE ONLY): 1125 SLP Time Calculation (min) (ACUTE ONLY): 15 min Past  Medical History: Past Medical History: Diagnosis Date . Muscle spasm  Past Surgical History: Past Surgical History: Procedure Laterality Date . ELBOW SURGERY   . ESOPHAGOGASTRODUODENOSCOPY N/A 10/04/2016  Procedure: ESOPHAGOGASTRODUODENOSCOPY (EGD);  Surgeon: Carol Ada, MD;  Location: [place];  Service: Gastroenterology;  Laterality: N/A; HPI: 66 year old male admitted 10/01/16 due to near syncope, AMS, acute encephalopathy and intermittent hypoxia. Pt intubated 1/24-28/18 due to respiratory distress. UGI 10/04/16 - hypertensive gastropathy vs inflammatory gastritis. CXR 10/07/16 - bilateral airspace disease. PMH unremarkable. No Data Recorded Assessment / Plan / Recommendation CHL IP CLINICAL IMPRESSIONS 10/20/2016 Therapy Diagnosis -- Clinical Impression Pt's swallow ability has improved from previous MBS. Mild sensorimotor (motor>sensory) oral and pharyngeal dysphagia. Laryngeal penetration present with thin, mostly flash although minimal amount remained in  vestibule following large and consecutive sips thin in which penetration occured before the swallow. Delayed oral transit (thin) and prolonged mastication with solid texture. Reduced tongue base retraction and decreased laryngeal elevation led to mild valleculae (mostly) and pyriform sinus residue. Verbal cues for second swallow cleared majority of residue. Smaller cup sips mitigated occurance of penetration. Recommend he continue with Dys 2 texture however upgrade liquids to thin, small sips, straws allowed and swallow twice after bites/sips.    Impact on safety and function --   CHL IP TREATMENT RECOMMENDATION 10/19/2016 Treatment Recommendations Therapy as outlined in treatment plan below   Prognosis 10/19/2016 Prognosis for Safe Diet Advancement Good Barriers to Reach Goals Cognitive deficits Barriers/Prognosis Comment -- CHL IP DIET RECOMMENDATION 10/19/2016 SLP Diet Recommendations Dysphagia 2 (Fine chop) solids;Thin liquid Liquid Administration via Cup;No straw Medication Administration Whole meds with puree Compensations Slow rate;Minimize environmental distractions;Small sips/bites Postural Changes Seated upright at 90 degrees   CHL IP OTHER RECOMMENDATIONS 10/19/2016 Recommended Consults -- Oral Care Recommendations Oral care BID Other Recommendations --   CHL IP FOLLOW UP RECOMMENDATIONS 10/19/2016 Follow up Recommendations Other (comment)   CHL IP FREQUENCY AND DURATION 10/19/2016 Speech Therapy Frequency (ACUTE ONLY) min 2x/week Treatment Duration 2 weeks      CHL IP ORAL PHASE 10/19/2016 Oral Phase Impaired Oral - Pudding Teaspoon -- Oral - Pudding Cup -- Oral - Honey Teaspoon -- Oral - Honey Cup -- Oral - Nectar Teaspoon -- Oral - Nectar Cup -- Oral - Nectar Straw NT Oral - Thin Teaspoon -- Oral - Thin Cup WFL Oral - Thin Straw WFL Oral - Puree NT Oral - Mech Soft NT Oral - Regular Lingual/palatal residue Oral - Multi-Consistency -- Oral - Pill -- Oral Phase - Comment --  CHL IP PHARYNGEAL PHASE 10/19/2016 Pharyngeal  Phase Impaired Pharyngeal- Pudding Teaspoon -- Pharyngeal -- Pharyngeal- Pudding Cup -- Pharyngeal -- Pharyngeal- Honey Teaspoon -- Pharyngeal -- Pharyngeal- Honey Cup -- Pharyngeal -- Pharyngeal- Nectar Teaspoon -- Pharyngeal -- Pharyngeal- Nectar Cup NT Pharyngeal -- Pharyngeal- Nectar Straw NT Pharyngeal -- Pharyngeal- Thin Teaspoon -- Pharyngeal -- Pharyngeal- Thin Cup Penetration/Aspiration during swallow;Penetration/Aspiration before swallow;Pharyngeal residue - valleculae;Pharyngeal residue - pyriform;Reduced laryngeal elevation;Reduced tongue base retraction;Delayed swallow initiation-pyriform sinuses Pharyngeal Material enters airway, remains ABOVE vocal cords then ejected out;Material enters airway, remains ABOVE vocal cords and not ejected out Pharyngeal- Thin Straw Delayed swallow initiation-pyriform sinuses;Pharyngeal residue - valleculae;Pharyngeal residue - pyriform;Reduced tongue base retraction;Reduced laryngeal elevation Pharyngeal Material does not enter airway Pharyngeal- Puree NT Pharyngeal -- Pharyngeal- Mechanical Soft NT Pharyngeal -- Pharyngeal- Regular WFL Pharyngeal -- Pharyngeal- Multi-consistency -- Pharyngeal -- Pharyngeal- Pill -- Pharyngeal -- Pharyngeal Comment --  CHL IP CERVICAL ESOPHAGEAL PHASE 10/19/2016 Cervical Esophageal Phase (No  Data) Pudding Teaspoon -- Pudding Cup -- Honey Teaspoon -- Honey Cup -- Nectar Teaspoon -- Nectar Cup -- Nectar Straw -- Thin Teaspoon -- Thin Cup -- Thin Straw -- Puree -- Mechanical Soft -- Regular -- Multi-consistency -- Pill -- Cervical Esophageal Comment -- No flowsheet data found. [name] 10/20/2016, 2:34 PM [name].Ed CCC-SLP Pager [phone]              Dg Swallowing Func-speech Pathology  Result Date: 10/17/2016 Objective Swallowing Evaluation: Type of Study: MBS-Modified Barium Swallow Study Patient Details Name: Barry Mccoy MRN: [id number] Date of Birth: March 25, 1951 Today's Date: 10/17/2016 Time: SLP Start Time (ACUTE  ONLY): 1145-SLP Stop Time (ACUTE ONLY): 1202 SLP Time Calculation (min) (ACUTE ONLY): 17 min Past Medical History: Past Medical History: Diagnosis Date . Muscle spasm  Past Surgical History: Past Surgical History: Procedure Laterality Date . ELBOW SURGERY   . ESOPHAGOGASTRODUODENOSCOPY N/A 10/04/2016  Procedure: ESOPHAGOGASTRODUODENOSCOPY (EGD);  Surgeon: Carol Ada, MD;  Location: [place];  Service: Gastroenterology;  Laterality: N/A; HPI: 66 year old male admitted 10/01/16 due to near syncope, AMS, acute encephalopathy and intermittent hypoxia. Pt intubated 1/24-28/18 due to respiratory distress. UGI 10/04/16 - hypertensive gastropathy vs inflammatory gastritis. CXR 10/07/16 - bilateral airspace disease. PMH unremarkable. No Data Recorded Assessment / Plan / Recommendation CHL IP CLINICAL IMPRESSIONS 10/17/2016 Therapy Diagnosis Mild pharyngeal phase dysphagia;Mild oral phase dysphagia Clinical Impression Pt demonstrates a mild oropharyngeal dysphagia, likely secondary to sluggish motor control and lethargy associated with this acute illness. Pt difficult to arouse for MBS, continues to be dysarthric and impulsive with self feeding. Primary problem is impulsive, overlarge intake of cup and straw sips of thin liquids leading to premature spillage into the vestibule and delayed swallow initiation with penetration before the swallow. Penetration events are silent, but clear with a cued throat clear. Pts dentures were not present for this test, but mastication of solids was low, requiring verbal cues. Recommend pt continue current diet recommendation of nectar thick liquids and dys 2(fine chopped solids) until mentation improves and arousal is consistent. Pt should not remain on nectar thick liquids long term as dysphagia is relatively mild. Will discuss with pts daughter.  Impact on safety and function Mild aspiration risk   CHL IP TREATMENT RECOMMENDATION 10/17/2016 Treatment Recommendations Therapy as outlined in  treatment plan below   Prognosis 10/17/2016 Prognosis for Safe Diet Advancement Good Barriers to Reach Goals Cognitive deficits Barriers/Prognosis Comment -- CHL IP DIET RECOMMENDATION 10/17/2016 SLP Diet Recommendations Dysphagia 2 (Fine chop) solids;Nectar thick liquid Liquid Administration via Cup;Straw Medication Administration Whole meds with puree Compensations Slow rate;Small sips/bites Postural Changes Seated upright at 90 degrees   CHL IP OTHER RECOMMENDATIONS 10/17/2016 Recommended Consults -- Oral Care Recommendations Oral care BID Other Recommendations Order thickener from pharmacy   CHL IP FOLLOW UP RECOMMENDATIONS 10/17/2016 Follow up Recommendations Skilled Nursing facility   [facility] IP FREQUENCY AND DURATION 10/17/2016 Speech Therapy Frequency (ACUTE ONLY) min 2x/week Treatment Duration 2 weeks      CHL IP ORAL PHASE 10/17/2016 Oral Phase Impaired Oral - Pudding Teaspoon -- Oral - Pudding Cup -- Oral - Honey Teaspoon -- Oral - Honey Cup -- Oral - Nectar Teaspoon -- Oral - Nectar Cup -- Oral - Nectar Straw WFL Oral - Thin Teaspoon -- Oral - Thin Cup Premature spillage Oral - Thin Straw Premature spillage Oral - Puree Decreased bolus cohesion;Delayed oral transit Oral - Mech Soft Decreased bolus cohesion;Delayed oral transit;Lingual/palatal residue;Impaired mastication Oral - Regular --  Oral - Multi-Consistency -- Oral - Pill -- Oral Phase - Comment --  CHL IP PHARYNGEAL PHASE 10/17/2016 Pharyngeal Phase Impaired Pharyngeal- Pudding Teaspoon -- Pharyngeal -- Pharyngeal- Pudding Cup -- Pharyngeal -- Pharyngeal- Honey Teaspoon -- Pharyngeal -- Pharyngeal- Honey Cup -- Pharyngeal -- Pharyngeal- Nectar Teaspoon -- Pharyngeal -- Pharyngeal- Nectar Cup WFL Pharyngeal -- Pharyngeal- Nectar Straw WFL Pharyngeal -- Pharyngeal- Thin Teaspoon -- Pharyngeal -- Pharyngeal- Thin Cup Delayed swallow initiation-pyriform sinuses;Penetration/Aspiration before swallow Pharyngeal Material enters airway, remains ABOVE vocal cords then  ejected out;Material does not enter airway;Material enters airway, CONTACTS cords and then ejected out Pharyngeal- Thin Straw Delayed swallow initiation-pyriform sinuses;Penetration/Aspiration before swallow Pharyngeal Material enters airway, CONTACTS cords and not ejected out Pharyngeal- Puree WFL Pharyngeal -- Pharyngeal- Mechanical Soft WFL Pharyngeal -- Pharyngeal- Regular -- Pharyngeal -- Pharyngeal- Multi-consistency -- Pharyngeal -- Pharyngeal- Pill -- Pharyngeal -- Pharyngeal Comment --  No flowsheet data found. No flowsheet data found. Herbie [place], [place] CCC-SLP [phone] Othelia Pulling Katherene Ponto 10/17/2016, 2:07 PM              Ct Angio Chest/abd/pel For Dissection W And/or Wo Contrast  Result Date: 10/01/2016 CLINICAL DATA:  66 year old male with left-sided flank pain, concerning for dissection. EXAM: CT ANGIOGRAPHY CHEST, ABDOMEN AND PELVIS TECHNIQUE: Multidetector CT imaging through the chest, abdomen and pelvis was performed using the standard protocol during bolus administration of intravenous contrast. Multiplanar reconstructed images including MIPs were obtained and reviewed to evaluate the vascular anatomy. CONTRAST:  100 cc Isovue 370 COMPARISON:  None. FINDINGS: CTA CHEST FINDINGS Cardiovascular: Heart: No cardiomegaly. No pericardial fluid/thickening. Calcifications of the left main, left anterior descending, circumflex coronary arteries. Aorta: Unremarkable course, caliber, contour of the thoracic aorta. No aneurysm or dissection flap. No periaortic fluid. Soft plaque of the aortic arch with minimal calcifications. Mixed atherosclerotic and soft plaque of the descending thoracic aorta. Pulmonary arteries: No central, lobar, segmental, or proximal subsegmental filling defects. Mediastinum/Nodes: Mediastinal lymph nodes are present, none of which are enlarged by CT size criteria. Unremarkable appearance of the thoracic esophagus. Unremarkable appearance of the thoracic inlet and thyroid.  Lungs/Pleura: No pneumothorax or pleural effusion. Centrilobular emphysema with minimal paraseptal emphysema at the lung apices. No confluent airspace disease. No significant bronchial wall thickening. No pneumothorax. Musculoskeletal: No displaced fracture. Degenerative changes of the thoracic spine. Review of the MIP images confirms the above findings. CTA ABDOMEN AND PELVIS FINDINGS VASCULAR Aorta: Mixed calcified and soft plaque of the abdominal aorta. No aneurysm. No periaortic fluid. No dissection. Celiac: Typical branch pattern of the celiac artery, with left gastric, common hepatic, and splenic artery identified. SMA: No significant atherosclerotic changes of the superior mesenteric artery. Renals: Renal arteries are patent. Single left and single right renal artery. IMA: Inferior mesenteric artery is patent. Left colic artery patent. Superior rectal artery patent. Right lower extremity: Unremarkable course, caliber, and contour of the right iliac system. No aneurysm, dissection, or occlusion. Hypogastric artery is patent. Anterior and posterior division patent. Common femoral artery patent. Proximal SFA and profunda femoris patent. Left lower extremity: Unremarkable course, caliber, and contour of the left iliac system. No aneurysm, dissection, or occlusion. Hypogastric artery is patent. Anterior and posterior division patent. Common femoral artery patent. Proximal SFA and profunda femoris patent. Veins: Splenic vein appears attenuated posterior to the body of the pancreas, though not well evaluated secondary to the phase of the contrast. Portal vein not well evaluated by the phase of the contrast bolus. Review of the MIP images confirms the above findings.  NON-VASCULAR Hepatobiliary: Unremarkable appearance of liver. The gallbladder demonstrates lumen full of non radiopaque stones. Pericholecystic fluid is present with questionable gallbladder wall thickening. No intrahepatic or extrahepatic biliary ductal  dilatation. Pancreas: No inflammatory changes at the head of the pancreas. Body the pancreas relatively unremarkable. Coarse calcification the tail of the pancreas. Focal fluid at the tail the pancreas extending towards the inferior pole of the spleen and adjacent to the splenic flexure, within the splenic colic ligament. No pancreatic ductal dilation. Spleen: Fluid and inflammatory changes adjacent to the inferior tip of the spleen. Adrenals/Urinary Tract: Unremarkable appearance of adrenal glands. No evidence of right-sided or left-sided hydronephrosis. Likely nonobstructive nephrolithiasis bilateral kidneys. Course the bilateral ureters unremarkable. Unremarkable appearance of the urinary bladder. Stomach/Bowel: Inflammatory changes adjacent to the splenic flexure of the colon. Circumferential wall thickening of the distal transverse colon and sigmoid colon. Small fat focus on the lateral sigmoid colon (image 170 series 6). Extensive colonic diverticula of the sigmoid colon, descending colon, and the distal transverse colon. Unremarkable stomach. Unremarkable small bowel with no abnormal distention. Normal appendix. Lymphatic: Lymph nodes within the preaortic and para-aortic nodal station. Mesenteric: Inflammatory changes within the left upper quadrant adjacent to the spleen. Inflammatory changes of the fat adjacent the gallbladder extending into the hilum of the liver surrounding portal venous structures. No free fluid. Reproductive: Unremarkable appearance of the pelvic organs. Other: No hernia. Musculoskeletal: No acute fracture identified. Multilevel degenerative changes of the lower lumbar spine worst at the L2-L3 level and L5-S1 level. Bilateral facet disease worst in the lower lumbar spine. Degenerative changes bilateral hips. Review of the MIP images confirms the above findings. IMPRESSION: Study is negative for findings of acute aortic syndrome. Significant inflammatory changes within the left upper  quadrant at the tail the pancreas and splenic flexure of the colon. There is apparent circumferential colon wall thickening, and small volume of free fluid. Differential diagnosis includes distal pancreatitis, diverticulitis/epiploic appendagitis, focal ischemia, or potentially sequela of portal hypertension/sinistral portal hypertension. Considered much less likely would be occult colon cancer with perforation. Cholelithiasis with circumferential fluid/gallbladder wall thickening. If there is concern for acute cholecystitis, correlation with lab values and potentially HIDA scan may be considered. Inflammatory changes adjacent to the gallbladder extend towards the liver hilum surrounding portal venous structures, and there is small caliber splenic vein posterior to the pancreas. Portal vein not well evaluated given the absence of a delayed CT phase. If there is concern for portal vein abnormality/thrombus, repeat contrast-enhanced CT with 8 delayed portal venous phase may be considered. The above results were called by telephone at the time of interpretation on 10/01/2016 at 9:30 pm to Dr. Ovid Curd PAGE , who verbally acknowledged these results. Aortic atherosclerosis without aneurysm. Coarse calcification the tail of the pancreas, potentially representing chronic pancreatitis/prior pancreatitis. Centrilobular emphysema with minimal paraseptal emphysema. Coronary artery disease. Signed, Dulcy Fanny. Earleen Newport, DO [facility] Radiology Electronically Signed   By: Corrie Mckusick D.O.   On: 10/01/2016 21:34    Microbiology: No results found for this or any previous visit (from the past 240 hour(s)).   Labs: Basic Metabolic Panel:  Recent Labs Lab 10/16/16 1255 10/17/16 0706 10/18/16 0454  NA 140 140 139  K 3.6 3.6 3.2*  CL 107 104 104  CO2 22 24 21*  GLUCOSE 103* 95 129*  BUN 8 7 8   CREATININE 0.95 0.95 0.92  CALCIUM 9.2 9.2 9.2  MG 1.9  --   --  Liver  Function Tests: No results for input(s): AST, ALT, ALKPHOS, BILITOT, PROT, ALBUMIN in the last 168 hours. No results for input(s): LIPASE, AMYLASE in the last 168 hours. No results for input(s): AMMONIA in the last 168 hours. CBC:  Recent Labs Lab 10/16/16 1255 10/17/16 0706 10/18/16 0454  WBC 7.3 5.6 6.7  HGB 10.7* 9.8* 9.6*  HCT 35.1* 32.4* 31.4*  MCV 81.6 81.4 80.5  PLT 222 203 220   Cardiac Enzymes: No results for input(s): CKTOTAL, CKMB, CKMBINDEX, TROPONINI in the last 168 hours. BNP: BNP (last 3 results) No results for input(s): BNP in the last 8760 hours.  ProBNP (last 3 results) No results for input(s): PROBNP in the last 8760 hours.  CBG:  Recent Labs Lab 10/20/16 0018 10/20/16 0420 10/20/16 0716 10/20/16 1139 10/20/16 1556  GLUCAP 122* 107* 135* 106* 147*       SignedDomenic Polite MD.  [facility] 10/23/2016, 9:14 AM

## 2016-10-23 NOTE — ED Provider Notes (Signed)
[facility] DEPT Provider Note   CSN: [id number] Arrival date & time: 10/23/16  1449     History   Chief Complaint Chief Complaint  Patient presents with  . Aggressive Behavior  . Paranoid    HPI Barry Mccoy is a 66 y.o. male.  Patient is a resident at [facility].  Patient attempted to leave the facility and walked out into traffic. Patient with increased confusion and aggressive behavior towards the staff.  [facility] staff have initiated IVC.  On current exam, patient is sitting quietly in chair in exam room, cooperative.  He states he recently had a "heart attack", although review of medical records only indicate a recent admission for GI bleed and acute delirium. He knows that he is at the [facility] ED now, but states he was at a [facility] this morning.     Mental Health Problem  Presenting symptoms: paranoid behavior   Patient accompanied by:  Law enforcement Progression:  Worsening Context: stressful life event   Associated symptoms: no abdominal pain and no chest pain     Past Medical History:  Diagnosis Date  . Anxiety   . Cervical spine disease   . Chronic kidney disease    kidney stones  . Disorder of lumbar spine   . Muscle spasm     Patient Active Problem List   Diagnosis Date Noted  . Altered mental status   . Acute encephalopathy   . Positive D dimer   . Severe single current episode of major depressive disorder, with psychotic features ([place])   . Delirium   . Acute respiratory failure ([place])   . Symptomatic anemia 10/01/2016  . Acute GI bleeding 10/01/2016  . Grief reaction 04/19/2016  . Depression 04/19/2016  . BPH (benign prostatic hyperplasia) 04/19/2016  . Testosterone deficiency 10/26/2015  . Vitamin D deficiency 10/21/2015  . Smoker 10/17/2015  . Chronic pain syndrome 10/17/2015  . Anxiety   . Cervical spine disease   . Disorder of lumbar spine     Past Surgical History:  Procedure Laterality Date  . ELBOW SURGERY      as child  . ELBOW SURGERY    . ESOPHAGOGASTRODUODENOSCOPY N/A 10/04/2016   Procedure: ESOPHAGOGASTRODUODENOSCOPY (EGD);  Surgeon: Carol Ada, MD;  Location: [facility];  Service: Gastroenterology;  Laterality: N/A;       Home Medications    Prior to Admission medications   Medication Sig Start Date End Date Taking? Authorizing Provider  OnabotulinumtoxinA (BOTOX IJ) Inject as directed See admin instructions. Botox injections done at Dr. Audery Amel office approximately every 90 days   Yes Historical Provider, MD  aspirin EC 81 MG tablet Take 81 mg by mouth daily.    Michel Santee, MD  baclofen (LIORESAL) 20 MG tablet TAKE 1 TABLET BY MOUTH EVERY 12 HOURS AS NEEDED FOR SPASMS 09/16/15   Historical Provider, MD  baclofen (LIORESAL) 20 MG tablet Take 1 tablet (20 mg total) by mouth at bedtime. 10/19/16   Domenic Polite, MD  Cholecalciferol (VITAMIN D-3) 5000 units TABS Take 5,000 Units by mouth at bedtime.    Historical Provider, MD  Cholecalciferol 4000 units CAPS Take by mouth.    Historical Provider, MD  diazepam (VALIUM) 5 MG tablet Take 1 tablet (5 mg total) by mouth every 8 (eight) hours as needed for anxiety. 04/19/16   Lonie Peak Dixon, PA-C  haloperidol (HALDOL) 1 MG tablet Take 1 tablet (1 mg total) by mouth every 8 (eight) hours as needed for agitation. 10/20/16  Domenic Polite, MD  lidocaine (LIDODERM) 5 % APPLY UP TO 3 PATCHES FOR 12 HOURS TO AREAS OF PAIN 09/19/15   Historical Provider, MD  lidocaine (LIDODERM) 5 % Place 2 patches onto the skin daily as needed (pain). Remove & Discard patch within 12 hours or as directed by MD    Historical Provider, MD  metoprolol tartrate (LOPRESSOR) 25 MG tablet Take 1 tablet (25 mg total) by mouth 2 (two) times daily. 10/21/16   Domenic Polite, MD  Multiple Vitamin (MULTIVITAMIN WITH MINERALS) TABS tablet Take 1 tablet by mouth at bedtime.    Historical Provider, MD  nicotine (NICODERM CQ - DOSED IN MG/24 HOURS) 14 mg/24hr patch Place 1 patch (14 mg  total) onto the skin daily. 10/20/16   Domenic Polite, MD  pantoprazole (PROTONIX) 40 MG tablet Take 1 tablet (40 mg total) by mouth daily. 10/19/16   Domenic Polite, MD  QUEtiapine (SEROQUEL) 50 MG tablet Take 1 tablet (50 mg total) by mouth at bedtime. 10/20/16   Domenic Polite, MD  sertraline (ZOLOFT) 50 MG tablet Take 1 tablet (50 mg total) by mouth daily. 05/31/16   Lonie Peak Dixon, PA-C  sertraline (ZOLOFT) 50 MG tablet Take 50 mg by mouth at bedtime.    Historical Provider, MD  tamsulosin (FLOMAX) 0.4 MG CAPS capsule Take 1 capsule (0.4 mg total) by mouth daily. 05/31/16   Lonie Peak Dixon, PA-C  tamsulosin (FLOMAX) 0.4 MG CAPS capsule Take 0.4 mg by mouth at bedtime.    Historical Provider, MD  Testosterone 20.25 MG/ACT (1.62%) GEL Apply 4 pumps to shoulders, upper arms, or abdomin daily.  Then wash hands thoroughly. Keep away from women, pregnant women and babies. 12/06/15   Lonie Peak Dixon, PA-C  thiamine 100 MG tablet Take 4 tablets (400 mg total) by mouth daily. For 4days then down to 100mg  daily 10/20/16   Domenic Polite, MD  traMADol (ULTRAM-ER) 300 MG 24 hr tablet TAKE 1 TABLET EVERYDAY 09/16/15   Historical Provider, MD  traZODone (DESYREL) 100 MG tablet Take 100 mg by mouth at bedtime. 09/29/15   Historical Provider, MD  traZODone (DESYREL) 100 MG tablet Take 2 tablets (200 mg total) by mouth at bedtime. 10/19/16   Domenic Polite, MD  UNABLE TO FIND Reported on 12/05/2015    Historical Provider, MD  UNABLE TO FIND Lidocaine nasal spray as directed    Historical Provider, MD  varenicline (CHANTIX CONTINUING MONTH PAK) 1 MG tablet Take 1 tablet (1 mg total) by mouth 2 (two) times daily. 10/17/15   [name], PA-C  varenicline (CHANTIX) 0.5 MG tablet Take 1 tablet (0.5 mg total) by mouth 2 (two) times daily. 10/17/15   [name], PA-C    Family History Family History  Problem Relation Age of Onset  . Colon cancer Brother   . Alcohol abuse Brother   . Cancer Brother   . Early death Brother   .  Prostate cancer Brother   . Colon polyps Brother   . Cancer Mother 85    colon cancer  . Colon cancer Mother     Social History Social History  Substance Use Topics  . Smoking status: Current Every Day Smoker    Packs/day: 1.00    Years: 50.00    Types: Cigarettes  . Smokeless tobacco: Never Used     Comment: attempting to quit- down to 8 a day   . Alcohol use No     Comment: 2 drinks a month  Allergies   Shellfish allergy   Review of Systems Review of Systems  Unable to perform ROS: Psychiatric disorder  Cardiovascular: Negative for chest pain.  Gastrointestinal: Negative for abdominal pain.  Psychiatric/Behavioral: Positive for paranoia.     Physical Exam Updated Vital Signs BP 149/75 (BP Location: Left Arm)   Pulse 79   Temp 98.1 F (36.7 C) (Oral)   Resp 18   SpO2 100%   Physical Exam  Constitutional: He appears well-developed and well-nourished.  HENT:  Head: Normocephalic and atraumatic.  Eyes: Pupils are equal, round, and reactive to light.  Neck: Neck supple.  Cardiovascular: Normal rate and regular rhythm.   Pulmonary/Chest: Effort normal and breath sounds normal.  Abdominal: Soft.  Musculoskeletal: Normal range of motion. He exhibits no edema.  Neurological: He is disoriented.  Skin: Skin is warm and dry.  Psychiatric: His speech is normal. His affect is not angry. He is withdrawn. Thought content is paranoid. He exhibits abnormal recent memory.  Nursing note and vitals reviewed.    ED Treatments / Results  Labs (all labs ordered are listed, but only abnormal results are displayed) Labs Reviewed - No data to display  EKG  EKG Interpretation None       Radiology No results found.  Procedures Procedures (including critical care time)  Medications Ordered in ED Medications - No data to display   Initial Impression / Assessment and Plan / ED Course  I have reviewed the triage vital signs and the nursing notes.  Pertinent  labs & imaging results that were available during my care of the patient were reviewed by me and considered in my medical decision making (see chart for details).   Patient with recent admission (10/01/16) for near syncopal episode attributed to anemia d/t GI bleed. He received 5 units PRBC and was admitted to ICU. He developed acute delirium/agitation, requiring high dose ativan. He subsequently developed acute respiratory failure and was intubated from 1/23-1/28.    Patient presents today from his nursing home with an increase in aggressive behavior and IVC paperwork. Medical evaluation reveals patient with urinary tract infection. Initial dose of rocephin started in ED.  Discussed with Dr. Zenia Resides, will request admission for delirium and treatment of UTI with IV antibiotics.  Patient accepted for admission by Dr. Myna Hidalgo.  Final Clinical Impressions(s) / ED Diagnoses   Final diagnoses:  Acute cystitis without hematuria  Delirium    New Prescriptions New Prescriptions   No medications on file     Etta Quill, NP 10/24/16 0123    Lacretia Leigh, MD 10/25/16 1501

## 2016-10-23 NOTE — ED Notes (Signed)
Pt remains on monitor. 

## 2016-10-23 NOTE — H&P (Signed)
History and Physical    Barry Mccoy [id number] DOB: [birth year] DOA: 10/23/2016  PCP: No PCP Per Patient   Patient coming from: SNF  Chief Complaint: Confusion, agitation  HPI: Barry Mccoy is a 66 y.o. male with medical history significant for anxiety, depression, and alcohol abuse, presenting to the emergency department from his SNF for evaluation of acute confusion and agitation. He had just been discharged from the hospital earlier today, but left the SNF, walking out into traffic, apparently confused and agitated. IVC was placed and the patient was brought into the ED by the PD. Patient had been admitted to the hospital on 10/01/2016 with melena, dyspnea, hypotension, and syncope. He was transfused 5 units of packed red blood cells during that admission which was complicated by delirium and agitation requiring high doses of benzodiazepines which precipitated respiratory failure, necessitating intubation. He was extubated on 10/07/2016. Patient continued to be delirious and agitated, requiring 4 point restraints and pharmacologic restraint. Neurology consultation was obtained during the hospitalization and the patient was started on high-dose thiamine which has since been tapered down. Outpatient neurocognitive testing was planned. He was also evaluated by gastroenterology with EGD notable for portal gastropathy and he was placed on Protonix daily. He was ultimately discharged to SNF earlier today with plans to continue weaning Seroquel and Haldol. Admission was also complicated by urinary retention. Voiding trial was unsuccessful prior to discharge and Foley was replaced. Patient has been kept on Flomax with outpatient urology follow-up.   ED Course: Upon arrival to the ED, patient is found to be afebrile, saturating well on room air, and with vital signs otherwise stable. EKG features sinus rhythm with PAC and QTc of 503 ms. Chemistry panel is essentially normal and CBC is notable for a hemoglobin  of 10.8, up from 9.6 five days ago. Urinalysis features many bacteria, TNTC white cells, large leukocytes, and positive nitrites. Large hemoglobin is also noted on dipstick. Patient was treated with an empiric dose of Rocephin in the emergency department, remained hemodynamically stable and in no apparent respiratory distress. He will be admitted to the telemetry unit for ongoing evaluation and management of acute encephalopathy, possibly secondary to UTI.  Review of Systems:  All other systems reviewed and apart from HPI, are negative.  Past Medical History:  Diagnosis Date  . Anxiety   . Cervical spine disease   . Chronic kidney disease    kidney stones  . Disorder of lumbar spine   . Muscle spasm     Past Surgical History:  Procedure Laterality Date  . ELBOW SURGERY     as child  . ELBOW SURGERY    . ESOPHAGOGASTRODUODENOSCOPY N/A 10/04/2016   Procedure: ESOPHAGOGASTRODUODENOSCOPY (EGD);  Surgeon: Carol Ada, MD;  Location: [facility];  Service: Gastroenterology;  Laterality: N/A;     reports that he has been smoking Cigarettes.  He has a 50.00 pack-year smoking history. He has never used smokeless tobacco. He reports that he does not drink alcohol or use drugs.  Allergies  Allergen Reactions  . Shellfish Allergy Other (See Comments)    Unknown     Family History  Problem Relation Age of Onset  . Colon cancer Brother   . Alcohol abuse Brother   . Cancer Brother   . Early death Brother   . Prostate cancer Brother   . Colon polyps Brother   . Cancer Mother 87    colon cancer  . Colon cancer Mother      Prior to Admission  medications   Medication Sig Start Date End Date Taking? Authorizing Provider  aspirin EC 81 MG tablet Take 81 mg by mouth daily.   Yes Michel Santee, MD  baclofen (LIORESAL) 20 MG tablet TAKE 1 TABLET BY MOUTH EVERY 12 HOURS AS NEEDED FOR SPASMS 09/16/15  Yes Historical Provider, MD  baclofen (LIORESAL) 20 MG tablet Take 1 tablet (20 mg total) by  mouth at bedtime. 10/19/16  Yes Domenic Polite, MD  Cholecalciferol (VITAMIN D-3) 5000 units TABS Take 5,000 Units by mouth at bedtime.   Yes Historical Provider, MD  diazepam (VALIUM) 5 MG tablet Take 1 tablet (5 mg total) by mouth every 8 (eight) hours as needed for anxiety. 04/19/16  Yes Mary B Dixon, PA-C  haloperidol (HALDOL) 1 MG tablet Take 1 tablet (1 mg total) by mouth every 8 (eight) hours as needed for agitation. 10/20/16  Yes Domenic Polite, MD  lidocaine (LIDODERM) 5 % Place 2 patches onto the skin daily as needed (pain). Remove & Discard patch within 12 hours or as directed by MD   Yes Historical Provider, MD  metoprolol tartrate (LOPRESSOR) 25 MG tablet Take 1 tablet (25 mg total) by mouth 2 (two) times daily. 10/21/16  Yes Domenic Polite, MD  Multiple Vitamin (MULTIVITAMIN WITH MINERALS) TABS tablet Take 1 tablet by mouth at bedtime.   Yes Historical Provider, MD  nicotine (NICODERM CQ - DOSED IN MG/24 HOURS) 14 mg/24hr patch Place 1 patch (14 mg total) onto the skin daily. 10/20/16  Yes Domenic Polite, MD  OnabotulinumtoxinA (BOTOX IJ) Inject as directed See admin instructions. Botox injections done at Dr. Audery Amel office approximately every 90 days   Yes Historical Provider, MD  pantoprazole (PROTONIX) 40 MG tablet Take 1 tablet (40 mg total) by mouth daily. 10/19/16  Yes Domenic Polite, MD  QUEtiapine (SEROQUEL) 50 MG tablet Take 1 tablet (50 mg total) by mouth at bedtime. 10/20/16  Yes Domenic Polite, MD  sertraline (ZOLOFT) 50 MG tablet Take 50 mg by mouth at bedtime.   Yes Historical Provider, MD  tamsulosin (FLOMAX) 0.4 MG CAPS capsule Take 0.4 mg by mouth at bedtime.   Yes Historical Provider, MD  Testosterone 20.25 MG/ACT (1.62%) GEL Apply 4 pumps to shoulders, upper arms, or abdomin daily.  Then wash hands thoroughly. Keep away from women, pregnant women and babies. 12/06/15  Yes [name], PA-C  thiamine 100 MG tablet Take 4 tablets (400 mg total) by mouth daily. For 4days then  down to 100mg  daily 10/20/16  Yes Domenic Polite, MD  traMADol (ULTRAM-ER) 300 MG 24 hr tablet TAKE 1 TABLET EVERYDAY 09/16/15  Yes Historical Provider, MD  traZODone (DESYREL) 100 MG tablet Take 2 tablets (200 mg total) by mouth at bedtime. 10/19/16  Yes Domenic Polite, MD  UNABLE TO FIND Lidocaine nasal spray as directed   Yes Historical Provider, MD  varenicline (CHANTIX CONTINUING MONTH PAK) 1 MG tablet Take 1 tablet (1 mg total) by mouth 2 (two) times daily. 10/17/15   [name], PA-C  varenicline (CHANTIX) 0.5 MG tablet Take 1 tablet (0.5 mg total) by mouth 2 (two) times daily. 10/17/15   [name], PA-C    Physical Exam: Vitals:   10/23/16 1504  BP: 149/75  Pulse: 79  Resp: 18  Temp: 98.1 F (36.7 C)  TempSrc: Oral  SpO2: 100%      Constitutional: No acute distress, calm  Eyes: PERTLA, lids and conjunctivae normal ENMT: Mucous membranes are moist. Posterior pharynx clear of any  exudate or lesions.   Neck: normal, supple, no masses, no thyromegaly Respiratory: clear to auscultation bilaterally, no wheezing, no crackles. Normal respiratory effort.   Cardiovascular: S1 & S2 heard, regular rate and rhythm. No extremity edema. No significant JVD. Abdomen: No distension, no tenderness, no masses palpated. Bowel sounds normal.  Musculoskeletal: no clubbing / cyanosis. No joint deformity upper and lower extremities. Normal muscle tone.  Skin: no significant rashes, lesions, ulcers. Warm, dry, well-perfused. Neurologic: CN 2-12 grossly intact. Sensation intact, DTR normal. Strength 5/5 in all 4 limbs.  Psychiatric:  Alert and oriented to person only. Not oriented to place, year, or situation. Calm and cooperative.     Labs on Admission: I have personally reviewed following labs and imaging studies  CBC:  Recent Labs Lab 10/17/16 0706 10/18/16 0454 10/23/16 1709  WBC 5.6 6.7 10.3  NEUTROABS  --   --  4.3  HGB 9.8* 9.6* 10.8*  HCT 32.4* 31.4* 34.8*  MCV 81.4 80.5 80.2  PLT  203 220 [id number]   Basic Metabolic Panel:  Recent Labs Lab 10/17/16 0706 10/18/16 0454 10/23/16 1709  NA 140 139 138  K 3.6 3.2* 3.9  CL 104 104 103  CO2 24 21* 26  GLUCOSE 95 129* 115*  BUN 7 8 16   CREATININE 0.95 0.92 1.07  CALCIUM 9.2 9.2 9.9   GFR: Estimated Creatinine Clearance: 79 mL/min (by C-G formula based on SCr of 1.07 mg/dL). Liver Function Tests:  Recent Labs Lab 10/23/16 1709  AST 27  ALT 28  ALKPHOS 74  BILITOT 0.7  PROT 7.7  ALBUMIN 4.2   No results for input(s): LIPASE, AMYLASE in the last 168 hours. No results for input(s): AMMONIA in the last 168 hours. Coagulation Profile: No results for input(s): INR, PROTIME in the last 168 hours. Cardiac Enzymes: No results for input(s): CKTOTAL, CKMB, CKMBINDEX, TROPONINI in the last 168 hours. BNP (last 3 results) No results for input(s): PROBNP in the last 8760 hours. HbA1C: No results for input(s): HGBA1C in the last 72 hours. CBG:  Recent Labs Lab 10/20/16 0018 10/20/16 0420 10/20/16 0716 10/20/16 1139 10/20/16 1556  GLUCAP 122* 107* 135* 106* 147*   Lipid Profile: No results for input(s): CHOL, HDL, LDLCALC, TRIG, CHOLHDL, LDLDIRECT in the last 72 hours. Thyroid Function Tests: No results for input(s): TSH, T4TOTAL, FREET4, T3FREE, THYROIDAB in the last 72 hours. Anemia Panel: No results for input(s): VITAMINB12, FOLATE, FERRITIN, TIBC, IRON, RETICCTPCT in the last 72 hours. Urine analysis:    Component Value Date/Time   COLORURINE AMBER (A) 10/23/2016 1850   APPEARANCEUR TURBID (A) 10/23/2016 1850   LABSPEC 1.024 10/23/2016 1850   PHURINE 6.0 10/23/2016 1850   GLUCOSEU NEGATIVE 10/23/2016 1850   HGBUR LARGE (A) 10/23/2016 1850   BILIRUBINUR NEGATIVE 10/23/2016 1850   KETONESUR NEGATIVE 10/23/2016 1850   PROTEINUR 100 (A) 10/23/2016 1850   NITRITE POSITIVE (A) 10/23/2016 1850   LEUKOCYTESUR LARGE (A) 10/23/2016 1850   Sepsis Labs: @LABRCNTIP (procalcitonin:4,lacticidven:4) )No results  found for this or any previous visit (from the past 240 hour(s)).   Radiological Exams on Admission: No results found.  EKG: Independently reviewed. Sinus rhythm, PAC, QTc 503 ms  Assessment/Plan  1. Acute encephalopathy  - Pt became confused at his SNF and walked out into traffic, leading to IVC being placed  - There are no focal neurologic deficits  - Folate, B12, and TSH wnl during recent admission; he was treated with high-dose thiamine; will check HIV and RPR - There is  UTI noted on presentation; this is the most-likely explanation and is addressed as below     2. UTI  - UA consistent with UTI  - Empiric Rocephin given in ED  - Urine sent for culture; prior culture grew multiple species, none isolated - Continue empiric Rocephin while following culture and clinical response to therapy    3. Normocytic anemia  - Hgb 10.8 on admission, up from 9.6 five days earlier  - Likely secondary to GI blood-loss as presented last month with melena and required 5 units of pRBCs; EGD was revealing of portal gastropathy  - No evidence for ongoing blood-loss - Continue daily Protonix    4. Urinary retention  - Failed voiding trial prior to recent discharge  - Continue Flomax, continue catheter care  5. 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 Prolonged QTc  - QTc 503 ms on admission - Pt is on numerous offending agents, will attempt to minimize - Replace potassium to 4 and magnesium to 2 - Monitor on telemetry    DVT prophylaxis: sq Lovenox  Code Status: Full  Family Communication: Discussed with patient Disposition Plan: Admit to telemetry Consults called: None Admission status: Inpatient    Vianne Bulls, MD [facility] Pager [phone]  If 7PM-7AM, please contact night-coverage [email or web address] Password  [facility]  10/23/2016, 10:21 PM

## 2016-10-23 NOTE — Clinical Social Work Note (Signed)
Pt is ready for discharge today and will go to [facility]. Pt and daughters are aware and agreeable to discharge plan. CSW sent clinicals to [facility] and communicated with Freda Munro (admissions) for room and report. Room and report provided to RN and put in treatment team sticky note. Transportation arranged with [facility]. CSW is signing off as no further needs identified.  Oretha Ellis, MSW, Latanya Presser, [facility] Social Worker  [phone]

## 2016-10-23 NOTE — ED Triage Notes (Signed)
BIB [facility], pt admitted to group home today. Pt was coming out of room and wouldn't go back.  Pt refusing to follow directions.  Pt from [facility].  Pt left facility today and stated he was going home.  Confused.  Pt is IVC.

## 2016-10-23 NOTE — Progress Notes (Signed)
Patient discharged to [facility] facility, reported to nurse William Dalton, transported by [facility], all his belongings sent with patient. Patient wearing his dentures at time of tranfer.

## 2016-10-24 ENCOUNTER — Inpatient Hospital Stay (HOSPITAL_COMMUNITY): Payer: Medicare Other

## 2016-10-24 ENCOUNTER — Encounter (HOSPITAL_COMMUNITY): Payer: Self-pay

## 2016-10-24 DIAGNOSIS — D649 Anemia, unspecified: Secondary | ICD-10-CM

## 2016-10-24 DIAGNOSIS — R319 Hematuria, unspecified: Secondary | ICD-10-CM

## 2016-10-24 LAB — BASIC METABOLIC PANEL
ANION GAP: 8 (ref 5–15)
BUN: 15 mg/dL (ref 6–20)
CALCIUM: 9.6 mg/dL (ref 8.9–10.3)
CO2: 28 mmol/L (ref 22–32)
CREATININE: 1.04 mg/dL (ref 0.61–1.24)
Chloride: 104 mmol/L (ref 101–111)
GFR calc Af Amer: >60 mL/min (ref >60)
GLUCOSE: 114 mg/dL — AB (ref 65–99)
Potassium: 4.3 mmol/L (ref 3.5–5.1)
Sodium: 140 mmol/L (ref 135–145)

## 2016-10-24 LAB — MAGNESIUM: Magnesium: 2.4 mg/dL (ref 1.7–2.4)

## 2016-10-24 MED ORDER — LORAZEPAM 2 MG/ML IJ SOLN
1.0000 mg | Freq: Once | INTRAMUSCULAR | Status: AC
  Administered 2016-10-24: 1 mg via INTRAVENOUS
  Filled 2016-10-24: qty 1

## 2016-10-24 MED ORDER — LORAZEPAM 2 MG/ML IJ SOLN
1.0000 mg | INTRAMUSCULAR | Status: DC | PRN
  Administered 2016-10-24: 1 mg via INTRAVENOUS
  Filled 2016-10-24: qty 1

## 2016-10-24 NOTE — Progress Notes (Signed)
CSW assessment completed on 2/1, please refer to additional information. LCSWA called SNF-[facility]. AC-Sheila report the facility will not allow patient to return due to pt. combative behaviors presented upon arrival 10/23/2016. LCSWA called and updated patient daugher-Trica. She reports feeling frustrated and disappointed with previous hospital. She reports the combative behavior is not normal for the patient. Patient daughter lives in [place], [place] and would like to be updated about patient care and disposition.   LCSWA called CSW at [facility] for additional information about place. She reports the patient clinical information was faxed out to several different facilities in town and out of town. The patient was declined by all of them.   At this time there is a Psych. Consult pending for patient.   LCSWA will continue to assist with patient disposition.   Kathrin Greathouse, Latanya Presser, MSW Clinical Social Worker 5E and Psychiatric Service Line [phone] 10/24/2016  12:55 PM

## 2016-10-24 NOTE — Progress Notes (Addendum)
Pt confused, experiencing auditory and verbal hallucinations, not following commands, attempting to remove foley and IV, removed tele, hitting staff, and attempting to leave. Not responding to less restrictive interventions. Pt placed in bilateral wrist and ankle restraints, order obtained. Will continue to monitor and assess for indications to progress out.

## 2016-10-24 NOTE — Care Management Note (Signed)
Case Management Note  Patient Details  Name: Barry Mccoy MRN: [id number] Date of Birth: 06-03-1951  Subjective/Objective:  66 y.o. M admitted from [facility] SNF with Acute Encephalopathy. Currently in Restraints and will need to be restraint free x 24 hours prior to discharge. CSW is following for this disposition. Will not return to [facility]                   Action/Plan:CM will sign off for now but will be available should additional discharge needs arise or disposition change.    Expected Discharge Date:                  Expected Discharge Plan:  Skilled Nursing Facility  In-House Referral:  Clinical Social Work  Discharge planning Services  CM Consult  Post Acute Care Choice:  NA Choice offered to:     DME Arranged:    DME Agency:     HH Arranged:    HH Agency:     Status of Service:     If discussed at [employer], dates discussed:    Additional Comments:  Delrae Sawyers, RN 10/24/2016, 11:59 AM

## 2016-10-24 NOTE — Progress Notes (Signed)
PROGRESS NOTE    Barry Mccoy  [id number]  DOB: 1951/02/14  DOA: 10/23/2016 PCP: No PCP Per Patient Outpatient Specialists:  Hospital course: Barry Mccoy is a 66 y.o. male with medical history significant for anxiety, depression, and alcohol abuse, presenting to the emergency department from his SNF for evaluation of acute confusion and agitation.   Assessment & Plan: 1. Acute encephalopathy  - Pt became confused at his SNF and walked out into traffic, leading to IVC being placed  - There are no focal neurologic deficits  - Folate, B12, and TSH wnl during recent admission; he was treated with high-dose thiamine; will check HIV and RPR - There is UTI  on presentation; It is being treated and still having confusion, check CT head without contrast.   - Requested psychiatry consultation  2. UTI  - UA consistent with UTI  - Empiric Rocephin given in ED  - Urine sent for culture; prior culture grew multiple species, none isolated - Continue empiric Rocephin while following culture and clinical response to therapy    3. Normocytic anemia  - Hgb 10.8 on admission, up from 9.6 five days earlier  - Likely secondary to GI blood-loss as presented last month with melena and required 5 units of pRBCs; EGD was revealing of portal gastropathy  - No evidence for ongoing blood-loss - Continue daily Protonix    4. Urinary retention  - Failed voiding trial prior to recent discharge  - Continue Flomax, continue catheter care, follow up with urology outpatient after discharge  5. 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 Prolonged QTc  - QTc 503 ms on admission - Pt is on numerous offending agents, will attempt to minimize - Replace potassium to 4 and magnesium to 2 - Monitored on telemetry as he is on haldol   DVT  prophylaxis: sq Lovenox  Code Status: Full  Family Communication: Discussed with patient Disposition Plan: Admit to telemetry Consults called: None Admission status: Inpatient   Consultants:  Psychiatry  Subjective: Pt still very confused and in safety restraints  Objective: Vitals:   10/23/16 1504 10/23/16 2251 10/23/16 2308 10/24/16 0631  BP: 149/75 141/87 (!) 142/73 131/64  Pulse: 79 81 78 64  Resp: 18 18 18 16   Temp: 98.1 F (36.7 C) 97.9 F (36.6 C) 98.7 F (37.1 C) 97.9 F (36.6 C)  TempSrc: Oral Oral Oral Oral  SpO2: 100% 100% 100% 98%  Weight:   91.8 kg (202 lb 6.1 oz)   Height:   6\' 2"  (1.88 m)     Intake/Output Summary (Last 24 hours) at 10/24/16 0937 Last data filed at 10/24/16 0917  Gross per 24 hour  Intake              260 ml  Output              900 ml  Net             -640 ml   Filed Weights   10/23/16 2308  Weight: 91.8 kg (202 lb 6.1 oz)    Exam:  General exam: awake, alert, confused, hallucinating Respiratory system:  No increased work of breathing. Cardiovascular system: S1 & S2 heard.   Gastrointestinal system: Abdomen is nondistended, soft and nontender. Normal bowel sounds heard. GU: foley in place.  Central nervous system: Alert  and oriented. No focal neurological deficits. Extremities: no cyanosis.  Data Reviewed: Basic Metabolic Panel:  Recent Labs Lab 10/18/16 0454 10/23/16 1709 10/24/16 0614  NA 139 138 140  K 3.2* 3.9 4.3  CL 104 103 104  CO2 21* 26 28  GLUCOSE 129* 115* 114*  BUN 8 16 15   CREATININE 0.92 1.07 1.04  CALCIUM 9.2 9.9 9.6  MG  --   --  2.4   Liver Function Tests:  Recent Labs Lab 10/23/16 1709  AST 27  ALT 28  ALKPHOS 74  BILITOT 0.7  PROT 7.7  ALBUMIN 4.2   No results for input(s): LIPASE, AMYLASE in the last 168 hours. No results for input(s): AMMONIA in the last 168 hours. CBC:  Recent Labs Lab 10/18/16 0454 10/23/16 1709  WBC 6.7 10.3  NEUTROABS  --  4.3  HGB 9.6* 10.8*  HCT  31.4* 34.8*  MCV 80.5 80.2  PLT 220 270   Cardiac Enzymes: No results for input(s): CKTOTAL, CKMB, CKMBINDEX, TROPONINI in the last 168 hours. CBG (last 3)  No results for input(s): GLUCAP in the last 72 hours. No results found for this or any previous visit (from the past 240 hour(s)).   Studies: No results found.  Scheduled Meds: . aspirin EC  81 mg Oral Daily  . baclofen  20 mg Oral QHS  . cefTRIAXone (ROCEPHIN)  IV  1 g Intravenous Q24H  . cholecalciferol  5,000 Units Oral QHS  . enoxaparin (LOVENOX) injection  40 mg Subcutaneous QHS  . metoprolol tartrate  25 mg Oral BID  . multivitamin with minerals  1 tablet Oral QHS  . nicotine  14 mg Transdermal Daily  . pantoprazole  40 mg Oral Daily  . QUEtiapine  50 mg Oral QHS  . sertraline  50 mg Oral QHS  . sodium chloride flush  3 mL Intravenous Q12H  . sodium chloride flush  3 mL Intravenous Q12H  . tamsulosin  0.4 mg Oral QHS  . thiamine  100 mg Oral Daily  . traZODone  200 mg Oral QHS   Continuous Infusions:  Principal Problem:   Acute encephalopathy Active Problems:   Normocytic anemia   UTI (urinary tract infection)   Urinary retention  Time spent:   Irwin Brakeman, MD, FAAFP [facility] Pager [phone]  If 7PM-7AM, please contact night-coverage [email or web address] Password [email or web address] 10/24/2016, 9:37 AM    LOS: 1 day

## 2016-10-25 DIAGNOSIS — R410 Disorientation, unspecified: Secondary | ICD-10-CM

## 2016-10-25 DIAGNOSIS — F333 Major depressive disorder, recurrent, severe with psychotic symptoms: Secondary | ICD-10-CM

## 2016-10-25 DIAGNOSIS — F17210 Nicotine dependence, cigarettes, uncomplicated: Secondary | ICD-10-CM

## 2016-10-25 DIAGNOSIS — Z8042 Family history of malignant neoplasm of prostate: Secondary | ICD-10-CM

## 2016-10-25 DIAGNOSIS — F05 Delirium due to known physiological condition: Secondary | ICD-10-CM

## 2016-10-25 DIAGNOSIS — Z811 Family history of alcohol abuse and dependence: Secondary | ICD-10-CM

## 2016-10-25 DIAGNOSIS — G9340 Encephalopathy, unspecified: Secondary | ICD-10-CM

## 2016-10-25 DIAGNOSIS — Z91013 Allergy to seafood: Secondary | ICD-10-CM

## 2016-10-25 DIAGNOSIS — Z79899 Other long term (current) drug therapy: Secondary | ICD-10-CM

## 2016-10-25 DIAGNOSIS — N390 Urinary tract infection, site not specified: Secondary | ICD-10-CM

## 2016-10-25 DIAGNOSIS — Z800 Family history of malignant neoplasm of digestive organs: Secondary | ICD-10-CM

## 2016-10-25 DIAGNOSIS — R4182 Altered mental status, unspecified: Secondary | ICD-10-CM

## 2016-10-25 DIAGNOSIS — J9600 Acute respiratory failure, unspecified whether with hypoxia or hypercapnia: Secondary | ICD-10-CM

## 2016-10-25 DIAGNOSIS — Z836 Family history of other diseases of the respiratory system: Secondary | ICD-10-CM

## 2016-10-25 DIAGNOSIS — R339 Retention of urine, unspecified: Secondary | ICD-10-CM

## 2016-10-25 LAB — CBC
HCT: 32.5 % — ABNORMAL LOW (ref 39.0–52.0)
Hemoglobin: 10.1 g/dL — ABNORMAL LOW (ref 13.0–17.0)
MCH: 24.5 pg — ABNORMAL LOW (ref 26.0–34.0)
MCHC: 31.1 g/dL (ref 30.0–36.0)
MCV: 78.7 fL (ref 78.0–100.0)
PLATELETS: 192 10*3/uL (ref 150–400)
RBC: 4.13 MIL/uL — ABNORMAL LOW (ref 4.22–5.81)
RDW: 18.2 % — AB (ref 11.5–15.5)
WBC: 5.6 10*3/uL (ref 4.0–10.5)

## 2016-10-25 LAB — COMPREHENSIVE METABOLIC PANEL
ALBUMIN: 3.4 g/dL — AB (ref 3.5–5.0)
ALT: 24 U/L (ref 17–63)
AST: 21 U/L (ref 15–41)
Alkaline Phosphatase: 59 U/L (ref 38–126)
Anion gap: 8 (ref 5–15)
BUN: 13 mg/dL (ref 6–20)
CHLORIDE: 105 mmol/L (ref 101–111)
CO2: 26 mmol/L (ref 22–32)
Calcium: 9 mg/dL (ref 8.9–10.3)
Creatinine, Ser: 1 mg/dL (ref 0.61–1.24)
GFR calc Af Amer: >60 mL/min (ref >60)
GFR calc non Af Amer: >60 mL/min (ref >60)
GLUCOSE: 89 mg/dL (ref 65–99)
POTASSIUM: 3.9 mmol/L (ref 3.5–5.1)
SODIUM: 139 mmol/L (ref 135–145)
Total Bilirubin: 0.9 mg/dL (ref 0.3–1.2)
Total Protein: 6.5 g/dL (ref 6.5–8.1)

## 2016-10-25 LAB — GLUCOSE, CAPILLARY: GLUCOSE-CAPILLARY: 218 mg/dL — AB (ref 65–99)

## 2016-10-25 MED ORDER — TRAZODONE HCL 100 MG PO TABS
100.0000 mg | ORAL_TABLET | Freq: Every day | ORAL | Status: DC
  Administered 2016-10-25 – 2016-10-26 (×2): 100 mg via ORAL
  Filled 2016-10-25 (×2): qty 1

## 2016-10-25 NOTE — Consult Note (Signed)
[place] Psychiatry Consult   Reason for Consult:  psychosis Referring Physician:  Dr. Wynetta Emery Patient Identification: Barry Mccoy MRN:  [id number] Principal Diagnosis: Acute encephalopathy Diagnosis:   Patient Active Problem List   Diagnosis Date Noted  . UTI (urinary tract infection) [N39.0] 10/23/2016  . Urinary retention [R33.9] 10/23/2016  . Altered mental status [R41.82]   . Acute encephalopathy [G93.40]   . Positive D dimer [R79.89]   . Severe single current episode of major depressive disorder, with psychotic features ([place]) [F32.3]   . Delirium [R41.0]   . Acute respiratory failure ([place]) [J96.00]   . Normocytic anemia [D64.9] 10/01/2016  . Acute GI bleeding [K92.2] 10/01/2016  . Grief reaction [F43.20] 04/19/2016  . Depression [F32.9] 04/19/2016  . BPH (benign prostatic hyperplasia) [N40.0] 04/19/2016  . Testosterone deficiency [E34.9] 10/26/2015  . Vitamin D deficiency [E55.9] 10/21/2015  . Smoker [F17.200] 10/17/2015  . Chronic pain syndrome [G89.4] 10/17/2015  . Anxiety [F41.9]   . Cervical spine disease [M48.9]   . 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 male patient admitted with acute GI bleeding  HPI: Barry Mccoy is a 66 years old male seen, chart reviewed and case discussed with the staff RN and LCSW. Patient presented with altered mental status, confusion, disoriented and combative after going to the skilled nursing facility from: [facility]. Patient was initially admitted to [facility] dizziness, hypotension, syncope, dyspnea and melena. Patient was known to this provider from his previous psychiatric consultation in the [facility] for increased agitation and aggressive behavior, and required chemical restraints with the Haldol and Seroquel. Patient appeared lying on his bed, calm and cooperative and continued to be confused and stated he does not know why he was brought  in back to the hospital. Patient trying to describe there are so many police officers and he asked all of them to step away from him, and they put handcuffs to bring him to the hospital. Patient is also known for alcohol dependence and was received a higher dose of benzodiazepines in the past for treatment. Patient denies current symptoms of depression, anxiety, auditory/visual hallucinations, delusions and paranoia. Patient also denies suicidal/homicidal ideation, intention or plan. Patient reported he lives in a house and he continued to believe that he came from the house. Patient was suffered with delirium secondary to urinary tract infection. Patient also received larger dose of thiamine to prevent alcoholic psychosis. Urine drug screen is positive for benzodiazepines only. Patient denies current use of alcohol or drug of abuse.  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 Anxiety   . BPH (benign prostatic hyperplasia)   . Cervical spine disease   . Chronic kidney disease    kidney stones  . Depression   . Disorder of lumbar spine   . ETOH abuse   . GI bleed   . Muscle spasm     Past Surgical History:  Procedure Laterality Date  . ELBOW SURGERY     as child  . ELBOW SURGERY    . ESOPHAGOGASTRODUODENOSCOPY N/A 10/04/2016   Procedure: ESOPHAGOGASTRODUODENOSCOPY (EGD);  Surgeon: Carol Ada, MD;  Location: [facility];  Service: Gastroenterology;  Laterality: N/A;   Family History:  Family History  Problem Relation Age of Onset  . Colon cancer Brother   . Alcohol abuse Brother   . Cancer Brother   . Early death Brother   . Prostate cancer Brother   . Colon polyps Brother   . Cancer Mother 33    colon cancer  . Colon cancer Mother    Family Psychiatric   History:  Social History:  History  Alcohol Use No    Comment: 2 drinks a month     History  Drug Use No    Social History   Social History  . Marital status: Widowed    Spouse name: N/A  . Number of children: N/A  . Years of education: N/A   Social History Main Topics  . Smoking status: Current Every Day Smoker    Packs/day: 1.00    Years: 50.00    Types: Cigarettes  . Smokeless tobacco: Never Used     Comment: attempting to quit- down to 8 a day   . Alcohol use No     Comment: 2 drinks a month  . Drug use: No  . Sexual activity: Yes   Other Topics Concern  . None   Social History Narrative   ** Merged History Encounter **       Additional Social History:    Allergies:   Allergies  Allergen Reactions  . Shellfish Allergy Other (See Comments)    Unknown     Labs:  Results for orders placed or performed during the hospital encounter of 10/23/16 (from the past 48 hour(s))  Comprehensive metabolic panel     Status: Abnormal   Collection Time: 10/23/16  5:09 PM  Result Value Ref Range   Sodium 138 135 - 145 mmol/L   Potassium 3.9 3.5 - 5.1 mmol/L   Chloride 103 101 - 111 mmol/L   CO2 26 22 - 32 mmol/L   Glucose, Bld 115 (H) 65 - 99 mg/dL   BUN 16 6 - 20 mg/dL   Creatinine, Ser 1.07 0.61 - 1.24 mg/dL   Calcium 9.9 8.9 - 10.3 mg/dL   Total Protein 7.7 6.5 - 8.1 g/dL   Albumin 4.2 3.5 - 5.0 g/dL   AST 27 15 - 41 U/L   ALT 28 17 - 63 U/L   Alkaline Phosphatase 74 38 - 126 U/L   Total Bilirubin 0.7 0.3 - 1.2 mg/dL   GFR calc non Af Amer >60 >60 mL/min   GFR calc Af Amer >60 >60 mL/min    Comment: (NOTE) The eGFR has been calculated using the CKD EPI equation. This calculation has not been validated in all clinical situations. eGFR's persistently <60 mL/min signify possible Chronic Kidney Disease.    Anion gap 9 5 - 15  CBC with Diff     Status: Abnormal   Collection Time: 10/23/16  5:09 PM  Result Value Ref Range   WBC 10.3 4.0 - 10.5 K/uL   RBC  4.34 4.22 - 5.81 MIL/uL   Hemoglobin 10.8 (L) 13.0 - 17.0 g/dL   HCT 34.8 (L) 39.0 - 52.0 %   MCV 80.2 78.0 - 100.0 fL   MCH 24.9 (L) 26.0 - 34.0 pg   MCHC 31.0 30.0 - 36.0 g/dL   RDW 18.5 (H) 11.5 - 15.5 %   Platelets 270 150 - 400 K/uL   Neutrophils Relative % 42 %   Neutro Abs 4.3 1.7 - 7.7 K/uL   Lymphocytes Relative  47 %   Lymphs Abs 5.0 (H) 0.7 - 4.0 K/uL   Monocytes Relative 5 %   Monocytes Absolute 0.5 0.1 - 1.0 K/uL   Eosinophils Relative 5 %   Eosinophils Absolute 0.5 0.0 - 0.7 K/uL   Basophils Relative 1 %   Basophils Absolute 0.1 0.0 - 0.1 K/uL  Urine rapid drug screen (hosp performed)not at [facility]     Status: Abnormal   Collection Time: 10/23/16  6:50 PM  Result Value Ref Range   Opiates NONE DETECTED NONE DETECTED   Cocaine NONE DETECTED NONE DETECTED   Benzodiazepines POSITIVE (A) NONE DETECTED   Amphetamines NONE DETECTED NONE DETECTED   Tetrahydrocannabinol NONE DETECTED NONE DETECTED   Barbiturates NONE DETECTED NONE DETECTED    Comment:        DRUG SCREEN FOR MEDICAL PURPOSES ONLY.  IF CONFIRMATION IS NEEDED FOR ANY PURPOSE, NOTIFY LAB WITHIN 5 DAYS.        LOWEST DETECTABLE LIMITS FOR URINE DRUG SCREEN Drug Class       Cutoff (ng/mL) Amphetamine      1000 Barbiturate      200 Benzodiazepine   854 Tricyclics       627 Opiates          300 Cocaine          300 THC              50   Urinalysis, Routine w reflex microscopic     Status: Abnormal   Collection Time: 10/23/16  6:50 PM  Result Value Ref Range   Color, Urine AMBER (A) YELLOW    Comment: BIOCHEMICALS MAY BE AFFECTED BY COLOR   APPearance TURBID (A) CLEAR   Specific Gravity, Urine 1.024 1.005 - 1.030   pH 6.0 5.0 - 8.0   Glucose, UA NEGATIVE NEGATIVE mg/dL   Hgb urine dipstick LARGE (A) NEGATIVE   Bilirubin Urine NEGATIVE NEGATIVE   Ketones, ur NEGATIVE NEGATIVE mg/dL   Protein, ur 100 (A) NEGATIVE mg/dL   Nitrite POSITIVE (A) NEGATIVE   Leukocytes, UA LARGE (A) NEGATIVE   RBC / HPF  TOO NUMEROUS TO COUNT 0 - 5 RBC/hpf   WBC, UA TOO NUMEROUS TO COUNT 0 - 5 WBC/hpf   Bacteria, UA MANY (A) NONE SEEN   Squamous Epithelial / LPF NONE SEEN NONE SEEN   Mucous PRESENT    Ca Oxalate Crys, UA PRESENT    Tyrosine Crys, UA POSITIVE    Crystals PRESENT (A) NEGATIVE  Culture, Urine     Status: Abnormal (Preliminary result)   Collection Time: 10/23/16  6:50 PM  Result Value Ref Range   Specimen Description URINE, CLEAN CATCH    Special Requests NONE    Culture (A)     >=100,000 COLONIES/mL UNIDENTIFIED ORGANISM Performed at [facility], [address], [place],  [address]    Report Status PENDING   Basic metabolic panel     Status: Abnormal   Collection Time: 10/24/16  6:14 AM  Result Value Ref Range   Sodium 140 135 - 145 mmol/L   Potassium 4.3 3.5 - 5.1 mmol/L   Chloride 104 101 - 111 mmol/L   CO2 28 22 - 32 mmol/L   Glucose, Bld 114 (H) 65 - 99 mg/dL   BUN 15 6 - 20 mg/dL   Creatinine, Ser 1.04 0.61 - 1.24 mg/dL   Calcium 9.6 8.9 - 10.3 mg/dL   GFR calc non Af Amer >60 >60 mL/min  GFR calc Af Amer >60 >60 mL/min    Comment: (NOTE) The eGFR has been calculated using the CKD EPI equation. This calculation has not been validated in all clinical situations. eGFR's persistently <60 mL/min signify possible Chronic Kidney Disease.    Anion gap 8 5 - 15  Magnesium     Status: None   Collection Time: 10/24/16  6:14 AM  Result Value Ref Range   Magnesium 2.4 1.7 - 2.4 mg/dL  CBC     Status: Abnormal   Collection Time: 10/25/16  5:25 AM  Result Value Ref Range   WBC 5.6 4.0 - 10.5 K/uL   RBC 4.13 (L) 4.22 - 5.81 MIL/uL   Hemoglobin 10.1 (L) 13.0 - 17.0 g/dL   HCT 32.5 (L) 39.0 - 52.0 %   MCV 78.7 78.0 - 100.0 fL   MCH 24.5 (L) 26.0 - 34.0 pg   MCHC 31.1 30.0 - 36.0 g/dL   RDW 18.2 (H) 11.5 - 15.5 %   Platelets 192 150 - 400 K/uL  Comprehensive metabolic panel     Status: Abnormal   Collection Time: 10/25/16  5:25 AM  Result Value Ref Range    Sodium 139 135 - 145 mmol/L   Potassium 3.9 3.5 - 5.1 mmol/L   Chloride 105 101 - 111 mmol/L   CO2 26 22 - 32 mmol/L   Glucose, Bld 89 65 - 99 mg/dL   BUN 13 6 - 20 mg/dL   Creatinine, Ser 1.00 0.61 - 1.24 mg/dL   Calcium 9.0 8.9 - 10.3 mg/dL   Total Protein 6.5 6.5 - 8.1 g/dL   Albumin 3.4 (L) 3.5 - 5.0 g/dL   AST 21 15 - 41 U/L   ALT 24 17 - 63 U/L   Alkaline Phosphatase 59 38 - 126 U/L   Total Bilirubin 0.9 0.3 - 1.2 mg/dL   GFR calc non Af Amer >60 >60 mL/min   GFR calc Af Amer >60 >60 mL/min    Comment: (NOTE) The eGFR has been calculated using the CKD EPI equation. This calculation has not been validated in all clinical situations. eGFR's persistently <60 mL/min signify possible Chronic Kidney Disease.    Anion gap 8 5 - 15  Glucose, capillary     Status: Abnormal   Collection Time: 10/25/16  8:18 AM  Result Value Ref Range   Glucose-Capillary 218 (H) 65 - 99 mg/dL    Current Facility-Administered Medications  Medication Dose Route Frequency Provider Last Rate Last Dose  . 0.9 %  sodium chloride infusion  250 mL Intravenous PRN Vianne Bulls, MD      . acetaminophen (TYLENOL) tablet 650 mg  650 mg Oral Q6H PRN Vianne Bulls, MD       Or  . acetaminophen (TYLENOL) suppository 650 mg  650 mg Rectal Q6H PRN Vianne Bulls, MD      . aspirin EC tablet 81 mg  81 mg Oral Daily Vianne Bulls, MD   81 mg at 10/25/16 1032  . baclofen (LIORESAL) tablet 20 mg  20 mg Oral QHS Vianne Bulls, MD   20 mg at 10/24/16 2227  . baclofen (LIORESAL) tablet 20 mg  20 mg Oral Q12H PRN Vianne Bulls, MD      . bisacodyl (DULCOLAX) EC tablet 5 mg  5 mg Oral Daily PRN Vianne Bulls, MD      . cefTRIAXone (ROCEPHIN) 1 g in dextrose 5 % 50 mL IVPB  1 g Intravenous  Q24H Vianne Bulls, MD   1 g at 10/24/16 2227  . cholecalciferol (VITAMIN D) tablet 5,000 Units  5,000 Units Oral QHS Vianne Bulls, MD   5,000 Units at 10/24/16 2226  . diazepam (VALIUM) tablet 5 mg  5 mg Oral Q8H PRN Vianne Bulls, MD   5 mg at 10/24/16 0758  . enoxaparin (LOVENOX) injection 40 mg  40 mg Subcutaneous QHS Ilene Qua Opyd, MD   40 mg at 10/24/16 2228  . haloperidol (HALDOL) tablet 1 mg  1 mg Oral Q8H PRN Vianne Bulls, MD   1 mg at 10/24/16 0227  . HYDROcodone-acetaminophen (NORCO/VICODIN) 5-325 MG per tablet 1-2 tablet  1-2 tablet Oral Q6H PRN Vianne Bulls, MD      . LORazepam (ATIVAN) injection 1 mg  1 mg Intravenous Q4H PRN [place] Marisa Hua, MD   1 mg at 10/24/16 1041  . metoprolol tartrate (LOPRESSOR) tablet 25 mg  25 mg Oral BID Vianne Bulls, MD   25 mg at 10/25/16 1032  . multivitamin with minerals tablet 1 tablet  1 tablet Oral QHS Vianne Bulls, MD   1 tablet at 10/24/16 2226  . nicotine (NICODERM CQ - dosed in mg/24 hours) patch 14 mg  14 mg Transdermal Daily Vianne Bulls, MD   14 mg at 10/25/16 1033  . pantoprazole (PROTONIX) EC tablet 40 mg  40 mg Oral Daily Vianne Bulls, MD   40 mg at 10/25/16 1032  . polyethylene glycol (MIRALAX / GLYCOLAX) packet 17 g  17 g Oral Daily PRN Vianne Bulls, MD      . QUEtiapine (SEROQUEL) tablet 50 mg  50 mg Oral QHS Vianne Bulls, MD   50 mg at 10/24/16 2226  . sertraline (ZOLOFT) tablet 50 mg  50 mg Oral QHS Vianne Bulls, MD   50 mg at 10/24/16 2227  . sodium chloride flush (NS) 0.9 % injection 3 mL  3 mL Intravenous Q12H Vianne Bulls, MD   3 mL at 10/24/16 1042  . sodium chloride flush (NS) 0.9 % injection 3 mL  3 mL Intravenous Q12H Vianne Bulls, MD   3 mL at 10/24/16 1042  . sodium chloride flush (NS) 0.9 % injection 3 mL  3 mL Intravenous PRN Vianne Bulls, MD      . tamsulosin (FLOMAX) capsule 0.4 mg  0.4 mg Oral QHS Ilene Qua Opyd, MD   0.4 mg at 10/24/16 2226  . thiamine (VITAMIN B-1) tablet 100 mg  100 mg Oral Daily Vianne Bulls, MD   100 mg at 10/25/16 1032  . traZODone (DESYREL) tablet 200 mg  200 mg Oral QHS Vianne Bulls, MD   200 mg at 10/24/16 2227    Musculoskeletal: Strength & Muscle Tone: not tested Gait & Station:  unable to stand Patient leans: N/A  Psychiatric Specialty Exam: Physical Exam  Psychiatric: Thought content normal. His mood appears anxious. His speech is tangential and slurred. He is combative. He expresses impulsivity.    Review of Systems  Unable to perform ROS: Mental status change    Blood pressure 139/76, pulse (!) 58, temperature 97.5 F (36.4 C), temperature source Axillary, resp. rate 16, height 6' 2"  (1.88 m), weight 91.8 kg (202 lb 6.1 oz), SpO2 100 %.Body mass index is 25.98 kg/m.  General Appearance: Casual  Eye Contact:  Good  Speech:  Clear and Coherent  Volume:  Normal  Mood:  Anxious and  Depressed  Affect:  Constricted and Depressed  Thought Process:  Irrelevant  Orientation:  Full (Time, Place, and Person)  Thought Content:  Illogical  Suicidal Thoughts:  No  Homicidal Thoughts:  No  Memory:  Immediate;   Fair Recent;   Poor poor  Judgement:  Other:  unable to assess  Insight:  unable to assess  Psychomotor Activity:  Increased and Restlessness  Concentration:  Concentration: Poor and Attention Span: Poor  Recall:  Poor  Fund of Knowledge:  Fair  Language:  Fair  Akathisia:  No  Handed:  Right  AIMS (if indicated):     Assets:  Social Support  ADL's:  Impaired  Cognition: decreased concentration,  he has intact orientation   Sleep:        Treatment Plan Summary:  66 years old male with history of alcohol dependence, multiple medical problems including recent encephalopathy, GI bleed stabilized in the hospital and discharged him to skilled nursing facility where he becomes more confused and combative need to be readmitted with involuntary commitment   Diagnosis:  1. major depressive disorder with psychosis  2. Subacute delirium slowly improving 3. Status post urinary tract infection   Recommended to descend involuntary commitment as patient has no agitation or aggressive behavior since came to the hospital and able to participate in treatment  recommendation. Patient has no safety concerns and has denied suicidal and homicidal ideation.  Recommendations; Neurology referral for delirium and acute confusional state Seroquel 50 mg at bedtime for hallucinations Sertraline 50 mg at bedtime for depression Trazodone 100 mg at bedtime for insomnia Haldol 1 mg as needed for agitation and aggressive behaviorssion. Appreciate psychiatric consultation and follow up as clinically required Please contact [phone] or [phone] if needs further assistance  Disposition: Patient does not meet criteria for psychiatric inpatient admission. Supportive therapy provided about ongoing stressors.    Ambrose Finland, MD 10/25/2016 10:48 AM

## 2016-10-25 NOTE — Progress Notes (Signed)
LCSWA assisted MD in rescinding patient IVC.  Patient will need to be without sitter for 24 hours.    Kathrin Greathouse, Latanya Presser, MSW Clinical Social Worker [place] and Psychiatric Service Line [phone] 10/25/2016

## 2016-10-25 NOTE — Progress Notes (Signed)
PROGRESS NOTE    Barry Mccoy  [id number]  DOB: 01-06-1951  DOA: 10/23/2016 PCP: No PCP Per Patient Outpatient Specialists:  Hospital course: Barry Mccoy is a 66 y.o. male with medical history significant for anxiety, depression, and alcohol abuse, presenting to the emergency department from his SNF for evaluation of acute confusion and agitation.  Assessment & Plan: 1. Acute encephalopathy  - Pt became confused at his SNF and walked out into traffic, leading to IVC being placed  - There are no focal neurologic deficits  - Folate, B12, and TSH wnl during recent admission; he was treated with high-dose thiamine; will check HIV and RPR - There is UTI  on presentation; It is being treated and still having confusion, CT head without contrast No acute findings.   - Requested psychiatry consultation 2/14 - Pt appears slightly improved 2/15 with safety restraints removed, will follow closely  2. UTI  - UA consistent with UTI  - Empiric Rocephin given in ED  - Urine sent for culture; prior culture grew multiple species, none isolated - Continue empiric Rocephin while following culture and clinical response to therapy    3. Normocytic anemia  - Hgb 10.8 on admission, up from 9.6 five days earlier  - Likely secondary to GI blood-loss as presented last month with melena and required 5 units of pRBCs; EGD was revealing of portal gastropathy  - No evidence for ongoing blood-loss - Continue daily Protonix    4. Urinary retention  - Failed voiding trial prior to recent discharge  - Continue Flomax, continue catheter care, follow up with urology outpatient after discharge  5. 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 Prolonged QTc  - QTc 503 ms on admission - Pt is on numerous offending agents, will attempt to  minimize - Replace potassium to 4 and magnesium to 2 - Monitored on telemetry as he is on haldol  - Repeat EKG 2/15  DVT prophylaxis: sq Lovenox  Code Status: Full  Family Communication: no family at bedside Disposition Plan: Working on placement, difficult placement issue per SW but they are working on it Consults called: None Admission status: Inpatient   Consultants:  Psychiatry  Subjective: Pt much more alert and less agitated this morning  Objective: Vitals:   10/24/16 0631 10/24/16 1529 10/24/16 2210 10/25/16 0613  BP: 131/64 132/72 (!) 143/84 139/76  Pulse: 64 64 63 (!) 58  Resp: 16 16 16 16   Temp: 97.9 F (36.6 C) 98.1 F (36.7 C) 97.7 F (36.5 C) 97.5 F (36.4 C)  TempSrc: Oral Oral Axillary Axillary  SpO2: 98% 99% 100% 100%  Weight:      Height:        Intake/Output Summary (Last 24 hours) at 10/25/16 0941 Last data filed at 10/25/16 0920  Gross per 24 hour  Intake              210 ml  Output             1000 ml  Net             -790 ml   Filed Weights   10/23/16 2308  Weight: 91.8 kg (202 lb 6.1 oz)   Exam:  General exam: awake, alert, confused, NAD.  Respiratory system:  No increased work of breathing. Cardiovascular system: S1 &  S2 heard.   Gastrointestinal system: Abdomen is nondistended, soft and nontender. Normal bowel sounds heard. GU: foley in place.  Central nervous system: Alert and oriented. No focal neurological deficits. Extremities: no cyanosis.  Data Reviewed: Basic Metabolic Panel:  Recent Labs Lab 10/23/16 1709 10/24/16 0614 10/25/16 0525  NA 138 140 139  K 3.9 4.3 3.9  CL 103 104 105  CO2 26 28 26   GLUCOSE 115* 114* 89  BUN 16 15 13   CREATININE 1.07 1.04 1.00  CALCIUM 9.9 9.6 9.0  MG  --  2.4  --    Liver Function Tests:  Recent Labs Lab 10/23/16 1709 10/25/16 0525  AST 27 21  ALT 28 24  ALKPHOS 74 59  BILITOT 0.7 0.9  PROT 7.7 6.5  ALBUMIN 4.2 3.4*   No results for input(s): LIPASE, AMYLASE in the  last 168 hours. No results for input(s): AMMONIA in the last 168 hours. CBC:  Recent Labs Lab 10/23/16 1709 10/25/16 0525  WBC 10.3 5.6  NEUTROABS 4.3  --   HGB 10.8* 10.1*  HCT 34.8* 32.5*  MCV 80.2 78.7  PLT 270 192   Cardiac Enzymes: No results for input(s): CKTOTAL, CKMB, CKMBINDEX, TROPONINI in the last 168 hours. CBG (last 3)   Recent Labs  10/25/16 0818  GLUCAP 218*   No results found for this or any previous visit (from the past 240 hour(s)).   Studies: Ct Head Wo Contrast  Result Date: 10/24/2016 CLINICAL DATA:  Anxiety, depression, alcohol abuse, confusion, agitation EXAM: CT HEAD WITHOUT CONTRAST TECHNIQUE: Contiguous axial images were obtained from the base of the skull through the vertex without intravenous contrast. COMPARISON:  Brain MRI 01/22/2014 FINDINGS: Brain: No intracranial hemorrhage, mass effect or midline shift. No acute cortical infarction. Stable cerebral atrophy. Stable periventricular and patchy subcortical chronic white matter disease. Prominent perivascular space or lacunar infarct in right basal ganglia is stable. No mass lesion is noted on this unenhanced scan. Ventricular size is stable from prior exam. Vascular: Mild atherosclerotic calcifications of carotid siphon. Skull: No skull fracture is noted. Sinuses/Orbits: No acute findings. Other: None IMPRESSION: No acute intracranial abnormality. Stable cerebral atrophy. Stable periventricular and patchy subcortical white matter decreased attenuation probable due to chronic small vessel ischemic changes. No definite acute cortical infarction. Stable prominent perivascular space or old lacunar infarct in right basal ganglia. Electronically Signed   By: Lahoma Crocker M.D.   On: 10/24/2016 11:30   Scheduled Meds: . aspirin EC  81 mg Oral Daily  . baclofen  20 mg Oral QHS  . cefTRIAXone (ROCEPHIN)  IV  1 g Intravenous Q24H  . cholecalciferol  5,000 Units Oral QHS  . enoxaparin (LOVENOX) injection  40 mg  Subcutaneous QHS  . metoprolol tartrate  25 mg Oral BID  . multivitamin with minerals  1 tablet Oral QHS  . nicotine  14 mg Transdermal Daily  . pantoprazole  40 mg Oral Daily  . QUEtiapine  50 mg Oral QHS  . sertraline  50 mg Oral QHS  . sodium chloride flush  3 mL Intravenous Q12H  . sodium chloride flush  3 mL Intravenous Q12H  . tamsulosin  0.4 mg Oral QHS  . thiamine  100 mg Oral Daily  . traZODone  200 mg Oral QHS   Continuous Infusions:  Principal Problem:   Acute encephalopathy Active Problems:   Normocytic anemia   UTI (urinary tract infection)   Urinary retention  Time spent:   Irwin Brakeman, MD, FAAFP [facility] Pager [phone]  If 7PM-7AM, please contact night-coverage [email or web address] Password [email or web address] 10/25/2016, 9:41 AM    LOS: 2 days

## 2016-10-25 NOTE — Progress Notes (Addendum)
LCSWA received call from assistant [facility] at [facility], in regards to family request to have a multimillionaire conference with medical team about patient care. She reported patient Aunt has requested to be updated with information in regards to patient. LCSWA explained patient daughter provided patient nurse with list of contact 2/14 to be added to patient chart and CSW had been in communication with daughter not Aunt.   LCSWA called patient daughter to follow up with request and update her with patient status. Patient daughter expressed frustrations with medical staff and plan of care for patient. Patient daughter screamed " You are not helping my father, you are checking boxes and throwing him out the hospital." Then hung up phone before CSW could update her. LCSWA received permission from patient to speak with his sister.   LCSWA called patient sister-Sheila. She reports the family is concerned about the patient and feels he was discharged to early from previous hospital. She requested to be updated and wants to have a meeting with medical staff. LCSWA  Informed Aunt this Probation officer is assisting with patient disposition. A new PT consult has been ordered for patient. LCSWA explained due to patient combative behaviors at previous SNF, it may be a challenge to place patient.  Patient Aunt reported understanding.   LCSWA informed RNCM family concerns and request.  RNCM familiar with family and was able to speak with pt. Aunt and set up conference with [facility], CSW, MD.  Latanya Presser spoke with physician about conference, he is agreeable.  PLAN: Have conference call with family at 10:00 am 2/16 to address concerns.

## 2016-10-25 NOTE — NC FL2 (Signed)
[place] LEVEL OF CARE SCREENING TOOL     IDENTIFICATION  Patient Name: Barry Mccoy Birthdate: [birth year] Sex: male Admission Date (Current Location): 10/23/2016  [facility] and [place] Number:  Herbalist and Address:  [facility],  [place] [address], [place]      Provider Number: [id number]  Attending Physician Name and Address:  Murlean Iba, MD  Relative Name and Phone Number:  Lorenda Hatchet (Daughter) [phone]    Current Level of Care: Hospital Recommended Level of Care: [place] Prior Approval Number:    Date Approved/Denied:   PASRR Number:    [id number]  Discharge Plan: SNF    Current Diagnoses: Patient Active Problem List   Diagnosis Date Noted  . UTI (urinary tract infection) 10/23/2016  . Urinary retention 10/23/2016  . Altered mental status   . Acute encephalopathy   . Positive D dimer   . Severe single current episode of major depressive disorder, with psychotic features ([place])   . Delirium   . Acute respiratory failure ([place])   . Normocytic anemia 10/01/2016  . Acute GI bleeding 10/01/2016  . Grief reaction 04/19/2016  . Depression 04/19/2016  . BPH (benign prostatic hyperplasia) 04/19/2016  . Testosterone deficiency 10/26/2015  . Vitamin D deficiency 10/21/2015  . Smoker 10/17/2015  . Chronic pain syndrome 10/17/2015  . Anxiety   . Cervical spine disease   . Disorder of lumbar spine     Orientation RESPIRATION BLADDER Height & Weight     Self, Time    Continent, External catheter Weight: 202 lb 6.1 oz (91.8 kg) Height:  6\' 2"  (188 cm)  BEHAVIORAL SYMPTOMS/MOOD NEUROLOGICAL BOWEL NUTRITION STATUS  [place]   Incontinent Diet (Regular)  AMBULATORY STATUS COMMUNICATION OF NEEDS Skin   Extensive Assist Verbally Normal                       Personal Care Assistance Level of Assistance  Bathing, Dressing, Feeding Bathing Assistance: Maximum assistance Feeding  assistance: Limited assistance Dressing Assistance: Maximum assistance     Functional Limitations Info  Sight, Hearing, Speech Sight Info: Adequate Hearing Info: Adequate Speech Info: Adequate    SPECIAL CARE FACTORS FREQUENCY  PT (By licensed PT)     PT Frequency: 5              Contractures Contractures Info: Not present    Additional Factors Info  Code Status Code Status Info: FullCode Allergies Info: Shellfish Psychotropic Info: Seroquel; Zoloft         Current Medications (10/25/2016):  This is the current hospital active medication list Current Facility-Administered Medications  Medication Dose Route Frequency Provider Last Rate Last Dose  . 0.9 %  sodium chloride infusion  250 mL Intravenous PRN Vianne Bulls, MD      . acetaminophen (TYLENOL) tablet 650 mg  650 mg Oral Q6H PRN Vianne Bulls, MD       Or  . acetaminophen (TYLENOL) suppository 650 mg  650 mg Rectal Q6H PRN Vianne Bulls, MD      . aspirin EC tablet 81 mg  81 mg Oral Daily Vianne Bulls, MD   81 mg at 10/25/16 1032  . baclofen (LIORESAL) tablet 20 mg  20 mg Oral QHS Vianne Bulls, MD   20 mg at 10/24/16 2227  . baclofen (LIORESAL) tablet 20 mg  20 mg Oral Q12H PRN Vianne Bulls, MD      .  bisacodyl (DULCOLAX) EC tablet 5 mg  5 mg Oral Daily PRN Vianne Bulls, MD      . cefTRIAXone (ROCEPHIN) 1 g in dextrose 5 % 50 mL IVPB  1 g Intravenous Q24H Vianne Bulls, MD   1 g at 10/24/16 2227  . cholecalciferol (VITAMIN D) tablet 5,000 Units  5,000 Units Oral QHS Vianne Bulls, MD   5,000 Units at 10/24/16 2226  . diazepam (VALIUM) tablet 5 mg  5 mg Oral Q8H PRN Vianne Bulls, MD   5 mg at 10/24/16 0758  . enoxaparin (LOVENOX) injection 40 mg  40 mg Subcutaneous QHS Ilene Qua Opyd, MD   40 mg at 10/24/16 2228  . haloperidol (HALDOL) tablet 1 mg  1 mg Oral Q8H PRN Vianne Bulls, MD   1 mg at 10/24/16 0227  . HYDROcodone-acetaminophen (NORCO/VICODIN) 5-325 MG per tablet 1-2 tablet  1-2 tablet Oral  Q6H PRN Vianne Bulls, MD      . LORazepam (ATIVAN) injection 1 mg  1 mg Intravenous Q4H PRN [place] Marisa Hua, MD   1 mg at 10/24/16 1041  . metoprolol tartrate (LOPRESSOR) tablet 25 mg  25 mg Oral BID Vianne Bulls, MD   25 mg at 10/25/16 1032  . multivitamin with minerals tablet 1 tablet  1 tablet Oral QHS Vianne Bulls, MD   1 tablet at 10/24/16 2226  . nicotine (NICODERM CQ - dosed in mg/24 hours) patch 14 mg  14 mg Transdermal Daily Vianne Bulls, MD   14 mg at 10/25/16 1033  . pantoprazole (PROTONIX) EC tablet 40 mg  40 mg Oral Daily Vianne Bulls, MD   40 mg at 10/25/16 1032  . polyethylene glycol (MIRALAX / GLYCOLAX) packet 17 g  17 g Oral Daily PRN Vianne Bulls, MD      . QUEtiapine (SEROQUEL) tablet 50 mg  50 mg Oral QHS Vianne Bulls, MD   50 mg at 10/24/16 2226  . sertraline (ZOLOFT) tablet 50 mg  50 mg Oral QHS Vianne Bulls, MD   50 mg at 10/24/16 2227  . sodium chloride flush (NS) 0.9 % injection 3 mL  3 mL Intravenous Q12H Vianne Bulls, MD   3 mL at 10/24/16 1042  . sodium chloride flush (NS) 0.9 % injection 3 mL  3 mL Intravenous Q12H Vianne Bulls, MD   3 mL at 10/24/16 1042  . sodium chloride flush (NS) 0.9 % injection 3 mL  3 mL Intravenous PRN Vianne Bulls, MD      . tamsulosin (FLOMAX) capsule 0.4 mg  0.4 mg Oral QHS Ilene Qua Opyd, MD   0.4 mg at 10/24/16 2226  . thiamine (VITAMIN B-1) tablet 100 mg  100 mg Oral Daily Vianne Bulls, MD   100 mg at 10/25/16 1032  . traZODone (DESYREL) tablet 200 mg  200 mg Oral QHS Vianne Bulls, MD   200 mg at 10/24/16 2227     Discharge Medications: Please see discharge summary for a list of discharge medications.  Relevant Imaging Results:  Relevant Lab Results:   Additional Information    Lia Hopping, LCSW

## 2016-10-25 NOTE — Progress Notes (Signed)
CSW asked if I would please call family member, Adela Lank while she is planning for SNF discharge. CM asked permission of pt to speak with sister, Loraine Leriche [phone] and pt gave permission.  CM called Shelia who discussed at length their [facility].   CM has explained Pt has now been denied access back to [facility]. CSW Elmyra Ricks is diligently searching for another SNF; for another SNF to consider, pt must be out of restraints and at this time has a telesitter (considered restraints).  Freda Munro is requesting a multidisciplinary conference call at 10:00am tomorrow including MD, CSW, CM daughter [name] [phone] and sister, Freda Munro.  CM has notified CSW Elmyra Ricks, MD, and Lead CM.

## 2016-10-26 ENCOUNTER — Inpatient Hospital Stay (HOSPITAL_COMMUNITY): Payer: Medicare Other

## 2016-10-26 DIAGNOSIS — N3000 Acute cystitis without hematuria: Secondary | ICD-10-CM

## 2016-10-26 LAB — COMPREHENSIVE METABOLIC PANEL
ALT: 21 U/L (ref 17–63)
ANION GAP: 7 (ref 5–15)
AST: 21 U/L (ref 15–41)
Albumin: 3.5 g/dL (ref 3.5–5.0)
Alkaline Phosphatase: 59 U/L (ref 38–126)
BUN: 11 mg/dL (ref 6–20)
CHLORIDE: 104 mmol/L (ref 101–111)
CO2: 27 mmol/L (ref 22–32)
Calcium: 9 mg/dL (ref 8.9–10.3)
Creatinine, Ser: 0.97 mg/dL (ref 0.61–1.24)
Glucose, Bld: 118 mg/dL — ABNORMAL HIGH (ref 65–99)
Potassium: 3.6 mmol/L (ref 3.5–5.1)
SODIUM: 138 mmol/L (ref 135–145)
Total Bilirubin: 0.7 mg/dL (ref 0.3–1.2)
Total Protein: 6.9 g/dL (ref 6.5–8.1)

## 2016-10-26 LAB — HIV ANTIBODY (ROUTINE TESTING W REFLEX): HIV SCREEN 4TH GENERATION: NONREACTIVE

## 2016-10-26 LAB — URINE CULTURE: Culture: >=100000 — AB

## 2016-10-26 LAB — CBC
HCT: 33.2 % — ABNORMAL LOW (ref 39.0–52.0)
Hemoglobin: 10.2 g/dL — ABNORMAL LOW (ref 13.0–17.0)
MCH: 24.1 pg — AB (ref 26.0–34.0)
MCHC: 30.7 g/dL (ref 30.0–36.0)
MCV: 78.5 fL (ref 78.0–100.0)
PLATELETS: 208 10*3/uL (ref 150–400)
RBC: 4.23 MIL/uL (ref 4.22–5.81)
RDW: 18.1 % — AB (ref 11.5–15.5)
WBC: 6.9 10*3/uL (ref 4.0–10.5)

## 2016-10-26 LAB — RPR: RPR Ser Ql: NONREACTIVE

## 2016-10-26 MED ORDER — POLYETHYLENE GLYCOL 3350 17 G PO PACK
17.0000 g | PACK | Freq: Every day | ORAL | Status: DC
  Administered 2016-10-27: 17 g via ORAL
  Filled 2016-10-26: qty 1

## 2016-10-26 MED ORDER — FOLIC ACID 1 MG PO TABS
1.0000 mg | ORAL_TABLET | Freq: Every day | ORAL | Status: DC
  Administered 2016-10-27: 1 mg via ORAL
  Filled 2016-10-26: qty 1

## 2016-10-26 MED ORDER — SULFAMETHOXAZOLE-TRIMETHOPRIM 800-160 MG PO TABS
1.0000 | ORAL_TABLET | Freq: Two times a day (BID) | ORAL | Status: DC
  Administered 2016-10-26 – 2016-10-27 (×3): 1 via ORAL
  Filled 2016-10-26 (×3): qty 1

## 2016-10-26 NOTE — Care Management Important Message (Signed)
Important Message  Patient Details  Name: Trashaun Mulkins MRN: [id number] Date of Birth: 01/08/1951   Medicare Important Message Given:  Yes    Kerin Salen 10/26/2016, 4:39 [place] Message  Patient Details  Name: Galdino Twisdale MRN: [id number] Date of Birth: 1951-05-26   Medicare Important Message Given:  Yes    Kerin Salen 10/26/2016, 4:39 PM

## 2016-10-26 NOTE — Progress Notes (Addendum)
LCSWA met with patient daughter-Tricia, and her spouse, pt. sisters and brother in law,(By phone)  MD, [facility] for conference about patient disposition.  During conference LCSWA explained role and position to assist with patient disposition. Patient daughter expressed her frustrations about her fathers care, therefore the director of [place] and patient experience was brought into provide support and provided resources and personal for pt. Daughter  to follow up with about her concerns.   LCSWA informed family that there was a PT consult pending to reassess patient, depending on patient recommendations the plan is to follow up with PT recommendations. LCSWA explained to family that patient was brought in from [facility] SNF facility. LCSWA had spoke with facility[facility] that reported due to patient behaviors he would not be able to return. LCSWA explained that [facility] facility) made offer but needed more information about patient from [facility]. LCSWA educated family that patient behavior at SNF may hinder other facilities accepting patient. But reinforced [facility] was looking at patient clinicals.   After meeting, LCSWA informed patient daughters that PT revaluated patient and reported patient needed 24 hour supervision and no rehab needed due to patient ability to walk several ft.  Patient daughters expressed they were quite pleased that patient no longer needed rehab because patient had expressed concerns about it.  LCSWA explained to family that medicare will not pay for 24 hour supervision at rehab center.  Daughters expressed family would discuss patient disposition over lunch.   LCSWA received call from family explaining that they could not provide care for patient as they did not feel safe taking him home due to [place]. LCSWA offered patient paying privately for care. Family reported that had no financial means to pay.   LCSWA received call from patient  Son in law requesting a second opinion from a outside provider not in [place] that will  [place] contacted RNCM and informed her of family request. LCSWA contacted [place] director and discussed family request. She informed this Probation officer to inform MD about patient request for possible transfer to another hospital. LCSWA discussed patient request with MD andd provided [place] Directors contact information.    4:45pm LCSWA later asked to speak with family again by neurologist about independent living and home health. LCSWA explained CSW does not facilitate independent living but explained process to family/private pay.  LCSWA informed RNCM about family request for Home Health information.

## 2016-10-26 NOTE — Progress Notes (Addendum)
PROGRESS NOTE    Geoffry Pickell  [id number]  DOB: 07/23/1951  DOA: 10/23/2016 PCP: No PCP Per Patient  Brief Admission History: Kebin Merrick is a 66 y.o. male with medical history significant for anxiety, depression, and alcohol abuse, presenting to the emergency department from his SNF for evaluation of acute confusion and agitation.  Assessment & Plan: 1. Acute encephalopathy  - Pt became confused at his SNF and walked out into traffic, leading to IVC being placed, now has been rescinded   - There are no focal neurologic deficits, CT head no acute findings, cerebral atrophy noted - Folate, B12, and TSH wnl during recent admission; he was treated with high-dose thiamine; HIV and RPR negative - There is UTI  on presentation; It is being treated and still having confusion, CT head without contrast No acute findings.   - Requested psychiatry consultation 2/14: See notes, also requested neurology consult 2/16.  - Pt much improved since 2/15 with safety restraints removed and not requiring PRN sedatives  2. UTI Staph species - UA consistent with UTI  - Empiric Rocephin given IV  - Urine sent for culture; prior culture grew multiple species, none isolated - Start oral Bactrim DS 2/16 based on C&S results    3. Normocytic anemia  - Hgb 10.8 on admission, up from 9.6 five days earlier  - Likely secondary to GI blood-loss as presented last month with melena and required 5 units of pRBCs; EGD was revealing of portal gastropathy  - No evidence for ongoing blood-loss - Continue daily Protonix    4. Urinary retention  - Failed voiding trial prior to recent discharge  - Continue Flomax, continue catheter care, follow up with urology outpatient after discharge  5. 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 Prolonged QTc   - QTc 503 ms on admission - Pt is on numerous offending agents, will attempt to minimize - Replace potassium to 4 and magnesium to 2 - He had been monitored on telemetry but has been stable, DC tele 2/16  DVT prophylaxis: sq Lovenox  Code Status: Full  Family Communication: had family conference this morning, I tried to address their concerns to best of my ability Disposition Plan: Pending  Consults called: Psychiatry and Neurology Admission status: Inpatient   Consultants:  Psychiatry  Subjective: Pt much less agitated  Objective: Vitals:   10/25/16 2114 10/26/16 0524 10/26/16 1016 10/26/16 1321  BP: 117/66 121/68 130/70 120/65  Pulse: 74 73 70 70  Resp: 18 18  20   Temp: 98 F (36.7 C) 98 F (36.7 C)  99 F (37.2 C)  TempSrc: Oral Oral  Oral  SpO2: 99% 100%  100%  Weight:      Height:        Intake/Output Summary (Last 24 hours) at 10/26/16 1540 Last data filed at 10/26/16 1330  Gross per 24 hour  Intake             2320 ml  Output                0 ml  Net             2320 ml   Filed Weights   10/23/16 2308  Weight: 91.8 kg (202 lb 6.1 oz)   Exam:  General exam: awake, alert, confused at  times and some confabulation, NAD.  Cooperative Respiratory system:  No increased work of breathing. Cardiovascular system: S1 & S2 heard.   Gastrointestinal system: Abdomen is nondistended, soft and nontender. Normal bowel sounds heard. GU: foley in place.  Central nervous system: Alert and oriented to person. No focal neurological deficits. Extremities: no cyanosis.  Data Reviewed: Basic Metabolic Panel:  Recent Labs Lab 10/23/16 1709 10/24/16 0614 10/25/16 0525 10/26/16 0531  NA 138 140 139 138  K 3.9 4.3 3.9 3.6  CL 103 104 105 104  CO2 26 28 26 27   GLUCOSE 115* 114* 89 118*  BUN 16 15 13 11   CREATININE 1.07 1.04 1.00 0.97  CALCIUM 9.9 9.6 9.0 9.0  MG  --  2.4  --   --    Liver Function Tests:  Recent Labs Lab 10/23/16 1709 10/25/16 0525  10/26/16 0531  AST 27 21 21   ALT 28 24 21   ALKPHOS 74 59 59  BILITOT 0.7 0.9 0.7  PROT 7.7 6.5 6.9  ALBUMIN 4.2 3.4* 3.5   No results for input(s): LIPASE, AMYLASE in the last 168 hours. No results for input(s): AMMONIA in the last 168 hours. CBC:  Recent Labs Lab 10/23/16 1709 10/25/16 0525 10/26/16 0531  WBC 10.3 5.6 6.9  NEUTROABS 4.3  --   --   HGB 10.8* 10.1* 10.2*  HCT 34.8* 32.5* 33.2*  MCV 80.2 78.7 78.5  PLT 270 192 208   Cardiac Enzymes: No results for input(s): CKTOTAL, CKMB, CKMBINDEX, TROPONINI in the last 168 hours. CBG (last 3)   Recent Labs  10/25/16 0818  GLUCAP 218*   Recent Results (from the past 240 hour(s))  Culture, Urine     Status: Abnormal   Collection Time: 10/23/16  6:50 PM  Result Value Ref Range Status   Specimen Description URINE, CLEAN CATCH  Final   Special Requests NONE  Final   Culture (A)  Final    >=100,000 COLONIES/mL STAPHYLOCOCCUS SPECIES (COAGULASE NEGATIVE)   Report Status 10/26/2016 FINAL  Final   Organism ID, Bacteria STAPHYLOCOCCUS SPECIES (COAGULASE NEGATIVE) (A)  Final      Susceptibility   Staphylococcus species (coagulase negative) - MIC*    CIPROFLOXACIN <=0.5 SENSITIVE Sensitive     GENTAMICIN <=0.5 SENSITIVE Sensitive     NITROFURANTOIN <=16 SENSITIVE Sensitive     OXACILLIN <=0.25 SENSITIVE Sensitive     TETRACYCLINE <=1 SENSITIVE Sensitive     VANCOMYCIN 1 SENSITIVE Sensitive     TRIMETH/SULFA <=10 SENSITIVE Sensitive     CLINDAMYCIN <=0.25 SENSITIVE Sensitive     RIFAMPIN <=0.5 SENSITIVE Sensitive     Inducible Clindamycin NEGATIVE Sensitive     * >=100,000 COLONIES/mL STAPHYLOCOCCUS SPECIES (COAGULASE NEGATIVE)     Studies: No results found. Scheduled Meds: . aspirin EC  81 mg Oral Daily  . baclofen  20 mg Oral QHS  . cholecalciferol  5,000 Units Oral QHS  . enoxaparin (LOVENOX) injection  40 mg Subcutaneous QHS  . metoprolol tartrate  25 mg Oral BID  . multivitamin with minerals  1 tablet Oral  QHS  . nicotine  14 mg Transdermal Daily  . pantoprazole  40 mg Oral Daily  . QUEtiapine  50 mg Oral QHS  . sertraline  50 mg Oral QHS  . sodium chloride flush  3 mL Intravenous Q12H  . sodium chloride flush  3 mL Intravenous Q12H  . sulfamethoxazole-trimethoprim  1 tablet Oral Q12H  . tamsulosin  0.4 mg Oral QHS  . thiamine  100 mg Oral Daily  . traZODone  100 mg Oral QHS   Continuous Infusions:  Principal Problem:   Acute encephalopathy Active Problems:   Normocytic anemia   UTI (urinary tract infection)   Urinary retention  Time spent:   Irwin Brakeman, MD, FAAFP [facility] Pager [phone]  If 7PM-7AM, please contact night-coverage [email or web address] Password [email or web address] 10/26/2016, 3:40 PM    LOS: 3 days

## 2016-10-26 NOTE — Consult Note (Signed)
NEURO HOSPITALIST CONSULT NOTE   Requestig physician: Dr. Wynetta Emery   Reason for Consult: not returning to baseline after infection   History obtained from:  Patient  And family  HPI:                                                                                                                                          Fabrice Hornback is an 66 y.o. male medical history significant for anxiety, depression, and alcohol abuse, presenting to the emergency department from his SNF for evaluation of acute confusion and agitation. He had just been discharged from the hospital earlier today, but left the SNF, walking out into traffic, apparently confused and agitated. IVC was placed and the patient was brought into the ED by the PD. Patient had been admitted to the hospital on 10/01/2016 with melena, dyspnea, hypotension, and syncope. He was transfused 5 units of packed red blood cells during that admission which was complicated by delirium and agitation requiring high doses of benzodiazepines which precipitated respiratory failure, necessitating intubation. He was extubated on 10/07/2016. Patient continued to be delirious and agitated, requiring 4 point restraints and pharmacologic restraint. Neurology consultation was obtained during the hospitalization and the patient was started on high-dose thiamine which has since been tapered down. Outpatient neurocognitive testing was planned. He was also evaluated by gastroenterology with EGD notable for portal gastropathy and he was placed on Protonix daily. He was ultimately discharged to SNF earlier today with plans to continue weaning Seroquel and Haldol. Admission was also complicated by urinary retention. Voiding trial was unsuccessful prior to discharge and Foley was replaced. Patient has been kept on Flomax with outpatient urology follow-up.   Return to [facility] secondary to continuing to be confused. Urinalysis did show many bacteria  along with large leukocytosis and positive nitrates. Patient was treated at that point. Patient continued to show confusion. Neurology was asked to come back and see patient secondary to the fact that family was very anxious about his not returning to his baseline. When a half-hour conversation was had with the family that patient does have significant atrophy, he was a chronic alcoholic in the past which does cause neurological changes, there is a possibility he was thiamine deficient, he likely does have some neuro cognitive decline at baseline and with all this infection and trauma his baseline will not be the same as it was prior for quite a while if not at all. Family was understandable to this however their main concern is where he will be taken after discharge and how he will live by himself until they can get him to live at the beach house with them.  Past Medical History:  Diagnosis Date  . Anxiety   . BPH (benign prostatic hyperplasia)   .  Cervical spine disease   . Chronic kidney disease    kidney stones  . Depression   . Disorder of lumbar spine   . ETOH abuse   . GI bleed   . Muscle spasm     Past Surgical History:  Procedure Laterality Date  . ELBOW SURGERY     as child  . ELBOW SURGERY    . ESOPHAGOGASTRODUODENOSCOPY N/A 10/04/2016   Procedure: ESOPHAGOGASTRODUODENOSCOPY (EGD);  Surgeon: Carol Ada, MD;  Location: [facility];  Service: Gastroenterology;  Laterality: N/A;    Family History  Problem Relation Age of Onset  . Colon cancer Brother   . Alcohol abuse Brother   . Cancer Brother   . Early death Brother   . Prostate cancer Brother   . Colon polyps Brother   . Cancer Mother 52    colon cancer  . Colon cancer Mother      Social History:  reports that he has been smoking Cigarettes.  He has a 50.00 pack-year smoking history. He has never used smokeless tobacco. He reports that he does not drink alcohol or use drugs.  Allergies  Allergen Reactions  .  Shellfish Allergy Other (See Comments)    Unknown     MEDICATIONS:                                                                                                                     Prior to Admission:  Prescriptions Prior to Admission  Medication Sig Dispense Refill Last Dose  . aspirin EC 81 MG tablet Take 81 mg by mouth daily.   Past Week at Unknown time  . baclofen (LIORESAL) 20 MG tablet TAKE 1 TABLET BY MOUTH EVERY 12 HOURS AS NEEDED FOR SPASMS  1 Past Week at Unknown time  . baclofen (LIORESAL) 20 MG tablet Take 1 tablet (20 mg total) by mouth at bedtime.  0 Past Week at Unknown time  . Cholecalciferol (VITAMIN D-3) 5000 units TABS Take 5,000 Units by mouth at bedtime.   Past Week at Unknown time  . diazepam (VALIUM) 5 MG tablet Take 1 tablet (5 mg total) by mouth every 8 (eight) hours as needed for anxiety. 40 tablet 0 Past Week at Unknown time  . haloperidol (HALDOL) 1 MG tablet Take 1 tablet (1 mg total) by mouth every 8 (eight) hours as needed for agitation. 15 tablet 0 Past Week at Unknown time  . lidocaine (LIDODERM) 5 % Place 2 patches onto the skin daily as needed (pain). Remove & Discard patch within 12 hours or as directed by MD   Past Week at Unknown time  . metoprolol tartrate (LOPRESSOR) 25 MG tablet Take 1 tablet (25 mg total) by mouth 2 (two) times daily.   Past Week at Unknown time  . Multiple Vitamin (MULTIVITAMIN WITH MINERALS) TABS tablet Take 1 tablet by mouth at bedtime.   Past Week at Unknown time  . nicotine (NICODERM CQ - DOSED IN MG/24 HOURS) 14 mg/24hr patch Place 1 patch (  14 mg total) onto the skin daily. 28 patch 0 Past Week at Unknown time  . OnabotulinumtoxinA (BOTOX IJ) Inject as directed See admin instructions. Botox injections done at Dr. Audery Amel office approximately every 90 days   Past Week at Unknown time  . pantoprazole (PROTONIX) 40 MG tablet Take 1 tablet (40 mg total) by mouth daily.   Past Week at Unknown time  . QUEtiapine (SEROQUEL) 50 MG tablet  Take 1 tablet (50 mg total) by mouth at bedtime.   Past Week at Unknown time  . sertraline (ZOLOFT) 50 MG tablet Take 50 mg by mouth at bedtime.   Past Week at Unknown time  . tamsulosin (FLOMAX) 0.4 MG CAPS capsule Take 0.4 mg by mouth at bedtime.   Past Week at Unknown time  . Testosterone 20.25 MG/ACT (1.62%) GEL Apply 4 pumps to shoulders, upper arms, or abdomin daily.  Then wash hands thoroughly. Keep away from women, pregnant women and babies. 450 g 1 Past Week at Unknown time  . thiamine 100 MG tablet Take 4 tablets (400 mg total) by mouth daily. For 4days then down to 100mg  daily   Past Week at Unknown time  . traMADol (ULTRAM-ER) 300 MG 24 hr tablet TAKE 1 TABLET EVERYDAY  1 Past Week at Unknown time  . traZODone (DESYREL) 100 MG tablet Take 2 tablets (200 mg total) by mouth at bedtime.   Past Week at Unknown time  . UNABLE TO FIND Lidocaine nasal spray as directed   Past Month at Unknown time  . varenicline (CHANTIX CONTINUING MONTH PAK) 1 MG tablet Take 1 tablet (1 mg total) by mouth 2 (two) times daily. 60 tablet 3 Taking  . varenicline (CHANTIX) 0.5 MG tablet Take 1 tablet (0.5 mg total) by mouth 2 (two) times daily. 40 tablet 0 Taking   Scheduled: . aspirin EC  81 mg Oral Daily  . baclofen  20 mg Oral QHS  . cholecalciferol  5,000 Units Oral QHS  . enoxaparin (LOVENOX) injection  40 mg Subcutaneous QHS  . metoprolol tartrate  25 mg Oral BID  . multivitamin with minerals  1 tablet Oral QHS  . nicotine  14 mg Transdermal Daily  . pantoprazole  40 mg Oral Daily  . QUEtiapine  50 mg Oral QHS  . sertraline  50 mg Oral QHS  . sodium chloride flush  3 mL Intravenous Q12H  . sodium chloride flush  3 mL Intravenous Q12H  . sulfamethoxazole-trimethoprim  1 tablet Oral Q12H  . tamsulosin  0.4 mg Oral QHS  . thiamine  100 mg Oral Daily  . traZODone  100 mg Oral QHS     ROS:                                                                                                                                        History obtained from chart review  General ROS:  negative for - chills, fatigue, fever, night sweats, weight gain or weight loss Psychological ROS: negative for - behavioral disorder, hallucinations, memory difficulties, mood swings or suicidal ideation Ophthalmic ROS: negative for - blurry vision, double vision, eye pain or loss of vision ENT ROS: negative for - epistaxis, nasal discharge, oral lesions, sore throat, tinnitus or vertigo Allergy and Immunology ROS: negative for - hives or itchy/watery eyes Hematological and Lymphatic ROS: negative for - bleeding problems, bruising or swollen lymph nodes Endocrine ROS: negative for - galactorrhea, hair pattern changes, polydipsia/polyuria or temperature intolerance Respiratory ROS: negative for - cough, hemoptysis, shortness of breath or wheezing Cardiovascular ROS: negative for - chest pain, dyspnea on exertion, edema or irregular heartbeat Gastrointestinal ROS: negative for - abdominal pain, diarrhea, hematemesis, nausea/vomiting or stool incontinence Genito-Urinary ROS: negative for - dysuria, hematuria, incontinence or urinary frequency/urgency Musculoskeletal ROS: negative for - joint swelling or muscular weakness Neurological ROS: as noted in HPI Dermatological ROS: negative for rash and skin lesion changes   Blood pressure 120/65, pulse 70, temperature 99 F (37.2 C), temperature source Oral, resp. rate 20, height 6\' 2"  (1.88 m), weight 91.8 kg (202 lb 6.1 oz), SpO2 100 %.   Neurologic Examination:                                                                                                      HEENT-  Normocephalic, no lesions, without obvious abnormality.  Normal external eye and conjunctiva.  Normal TM's bilaterally.  Normal auditory canals and external ears. Normal external nose, mucus membranes and septum.  Normal pharynx. Cardiovascular- S1, S2 normal, pulses palpable throughout   Lungs- chest clear, no  wheezing, rales, normal symmetric air entry Abdomen- normal findings: bowel sounds normal Extremities- no edema Lymph-no adenopathy palpable Musculoskeletal-no joint tenderness, deformity or swelling Skin-warm and dry, no hyperpigmentation, vitiligo, or suspicious lesions  Neurological Examination Mental Status: Patient is alert and oriented. He is perseverating on the fact that he is very depressed about the death of his wife and he is looking forward to moving to the beach house to get in to a new norm and get interested in such things as walking on the beach joining groups and joining a church. Cranial Nerves: II: Visual fields grossly normal, pupils equal, round, reactive to light and accommodation III,IV, VI: ptosis not present, extra-ocular motions intact bilaterally V,VII: smile symmetric, facial light touch sensation normal bilaterally VIII: hearing normal bilaterally IX,X: uvula rises symmetrically XI: bilateral shoulder shrug XII: midline tongue extension Motor: Right : Upper extremity   5/5    Left:     Upper extremity   5/5  Lower extremity   5/5     Lower extremity   5/5 Tone and bulk:normal tone throughout; no atrophy noted Sensory: Pinprick and light touch intact throughout, bilaterally Deep Tendon Reflexes: 2+ and symmetric throughout Plantars: Right: downgoing   Left: downgoing Cerebellar: normal finger-to-nose, normal rapid alternating movements and normal heel-to-shin test Gait: Not tested      Lab Results: Basic Metabolic Panel:  Recent Labs Lab 10/23/16 1709 10/24/16 [id number]  10/25/16 0525 10/26/16 0531  NA 138 140 139 138  K 3.9 4.3 3.9 3.6  CL 103 104 105 104  CO2 26 28 26 27   GLUCOSE 115* 114* 89 118*  BUN 16 15 13 11   CREATININE 1.07 1.04 1.00 0.97  CALCIUM 9.9 9.6 9.0 9.0  MG  --  2.4  --   --     Liver Function Tests:  Recent Labs Lab 10/23/16 1709 10/25/16 0525 10/26/16 0531  AST 27 21 21   ALT 28 24 21   ALKPHOS 74 59 59  BILITOT  0.7 0.9 0.7  PROT 7.7 6.5 6.9  ALBUMIN 4.2 3.4* 3.5   No results for input(s): LIPASE, AMYLASE in the last 168 hours. No results for input(s): AMMONIA in the last 168 hours.  CBC:  Recent Labs Lab 10/23/16 1709 10/25/16 0525 10/26/16 0531  WBC 10.3 5.6 6.9  NEUTROABS 4.3  --   --   HGB 10.8* 10.1* 10.2*  HCT 34.8* 32.5* 33.2*  MCV 80.2 78.7 78.5  PLT 270 192 208    Cardiac Enzymes: No results for input(s): CKTOTAL, CKMB, CKMBINDEX, TROPONINI in the last 168 hours.  Lipid Panel: No results for input(s): CHOL, TRIG, HDL, CHOLHDL, VLDL, LDLCALC in the last 168 hours.  CBG:  Recent Labs Lab 10/20/16 0420 10/20/16 0716 10/20/16 1139 10/20/16 1556 10/25/16 0818  GLUCAP 107* 135* 106* 147* 218*    Microbiology: Results for orders placed or performed during the hospital encounter of 10/23/16  Culture, Urine     Status: Abnormal   Collection Time: 10/23/16  6:50 PM  Result Value Ref Range Status   Specimen Description URINE, CLEAN CATCH  Final   Special Requests NONE  Final   Culture (A)  Final    >=100,000 COLONIES/mL STAPHYLOCOCCUS SPECIES (COAGULASE NEGATIVE)   Report Status 10/26/2016 FINAL  Final   Organism ID, Bacteria STAPHYLOCOCCUS SPECIES (COAGULASE NEGATIVE) (A)  Final      Susceptibility   Staphylococcus species (coagulase negative) - MIC*    CIPROFLOXACIN <=0.5 SENSITIVE Sensitive     GENTAMICIN <=0.5 SENSITIVE Sensitive     NITROFURANTOIN <=16 SENSITIVE Sensitive     OXACILLIN <=0.25 SENSITIVE Sensitive     TETRACYCLINE <=1 SENSITIVE Sensitive     VANCOMYCIN 1 SENSITIVE Sensitive     TRIMETH/SULFA <=10 SENSITIVE Sensitive     CLINDAMYCIN <=0.25 SENSITIVE Sensitive     RIFAMPIN <=0.5 SENSITIVE Sensitive     Inducible Clindamycin NEGATIVE Sensitive     * >=100,000 COLONIES/mL STAPHYLOCOCCUS SPECIES (COAGULASE NEGATIVE)    Coagulation Studies: No results for input(s): LABPROT, INR in the last 72 hours.  Imaging: No results  found.     Assessment and plan per attending neurologist  Etta Quill PA-C [facility] [phone]  10/26/2016, 4:42 PM   Assessment/Plan: This is a 66 year old male with multifactorial issues. Patient has been hospital as multiple times for infection, at baseline he has significant atrophy and a history of significant alcoholism. Although family states that he had no memory deficits or issues there is question of neuro cognitive decline at some degree in the past. 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 It has been explained in detail to the family that he likely will not return back to his baseline however it will take time to find out where his new baseline is. They seemed to be open to this. I did recommend getting an MRI of the brain  to evaluate for shower of emboli however I did note to them that I doubt that this is the cause of his confusion.

## 2016-10-26 NOTE — Progress Notes (Signed)
Received a call from [place], family have questions about [facility] services. Discussed what HH looks like and limitations of services available. Daughter wishes to take patient to [facility] at d/c. Patient's PCP is in [facility], does not have a PCP in [place]. Discussed with family that patient would need a physician to sign orders for [facility] services, an agency there would not likely take orders from a physician in [place]. Suggested that patient stay here and have [place] until able to travel but daughter does not want this. She plans on working on getting a PCP in [place] and finding a [place] agency there. Shared with her he would not likely qualify for anything but nursing visit as he is independent with ambulation, not requiring assistive devices and no assistance from staff. She expressed understanding. Informed her if plans change to contact the weekend CM/CSW for continued assistance.

## 2016-10-26 NOTE — Evaluation (Addendum)
Physical Therapy Evaluation Patient Details Name: Barry Mccoy MRN: [id number] DOB: 1951/04/15 Today's Date: 10/26/2016   History of Present Illness   66 y.o. male with medical history significant for anxiety, depression, and alcohol abuse, presenting to the emergency department from his SNF for evaluation of acute confusion and agitation. Dx: acute encephalopathy, UTI, portal gastropathy, anemia. He had recent hospitalization 10/01/16-10/23/16 for melena, VDRF, syncope. He returned to hospital on day of DC, because he left the SNF, walking out into traffic, apparently confused and agitated.  Clinical Impression  Pt is independent with mobility, he ambulated 240' without an assistive device without loss of balance. No f/u PT needed, however he'd benefit from 24* supervision due to decreased safety awareness 2* encephalopathy. PT signing off.     Follow Up Recommendations No PT follow up (24* supervision for safety due to encephalopathy)    Equipment Recommendations  None recommended by PT    Recommendations for Other Services       Precautions / Restrictions Precautions Precautions: Fall Precaution Comments: pt reports 2 falls in past year Restrictions Weight Bearing Restrictions: No      Mobility  Bed Mobility Overal bed mobility: Modified Independent Bed Mobility: Supine to Sit     Supine to sit: Modified independent (Device/Increase time)     General bed mobility comments: used rail, HOB up 15*  Transfers Overall transfer level: Independent Equipment used: None Transfers: Sit to/from Stand Sit to Stand: Independent         General transfer comment: No assist needed for balance.   Ambulation/Gait Ambulation/Gait assistance: Independent Ambulation Distance (Feet): 240 Feet Assistive device: None Gait Pattern/deviations: WFL(Within Functional Limits)   Gait velocity interpretation: at or above normal speed for age/gender General Gait Details: steady without  assistive device, no LOB  Stairs            Wheelchair Mobility    Modified Rankin (Stroke Patients Only)       Balance     Sitting balance-Leahy Scale: Normal       Standing balance-Leahy Scale: Good                               Pertinent Vitals/Pain Pain Assessment: No/denies pain    Home Living Family/patient expects to be discharged to:: [place]: None      Prior Function Level of Independence: Independent         Comments: pt reports he was indep PTA and didn't use AD     Hand Dominance        Extremity/Trunk Assessment   Upper Extremity Assessment Upper Extremity Assessment: Overall WFL for tasks assessed    Lower Extremity Assessment Lower Extremity Assessment: Overall WFL for tasks assessed (+4/5 B knee extension)    Cervical / Trunk Assessment Cervical / Trunk Assessment: Normal  Communication   Communication: No difficulties  Cognition Arousal/Alertness: Awake/alert Behavior During Therapy: WFL for tasks assessed/performed Overall Cognitive Status: No family/caregiver present to determine baseline cognitive functioning Area of Impairment: Memory;Safety/judgement     Memory: Decreased short-term memory Following Commands: Follows one step commands consistently Safety/Judgement: Decreased awareness of safety (attempted to walk prior to unhooking catheter from bed)          General Comments      Exercises     Assessment/Plan    PT Assessment Patent does  not need any further PT services  PT Problem List            PT Treatment Interventions      PT Goals (Current goals can be found in the Care Plan section)  Acute Rehab PT Goals Patient Stated Goal: didn't state PT Goal Formulation: All assessment and education complete, DC therapy    Frequency     Barriers to discharge        Co-evaluation               End of Session Equipment Utilized  During Treatment: Gait belt Activity Tolerance: Patient tolerated treatment well Patient left: in chair;with call bell/phone within reach Nurse Communication: Mobility status         Time: [id number] PT Time Calculation (min) (ACUTE ONLY): 15 min   Charges:   PT Evaluation $PT Eval Low Complexity: 1 Procedure     PT G Codes:        Philomena Doheny 10/26/2016, 11:48 AM [phone]

## 2016-10-27 LAB — COMPREHENSIVE METABOLIC PANEL
ALBUMIN: 3.4 g/dL — AB (ref 3.5–5.0)
ALT: 20 U/L (ref 17–63)
AST: 18 U/L (ref 15–41)
Alkaline Phosphatase: 59 U/L (ref 38–126)
Anion gap: 6 (ref 5–15)
BILIRUBIN TOTAL: 0.5 mg/dL (ref 0.3–1.2)
BUN: 12 mg/dL (ref 6–20)
CO2: 29 mmol/L (ref 22–32)
Calcium: 9.1 mg/dL (ref 8.9–10.3)
Chloride: 104 mmol/L (ref 101–111)
Creatinine, Ser: 0.95 mg/dL (ref 0.61–1.24)
GFR calc Af Amer: >60 mL/min (ref >60)
GFR calc non Af Amer: >60 mL/min (ref >60)
GLUCOSE: 131 mg/dL — AB (ref 65–99)
POTASSIUM: 3.5 mmol/L (ref 3.5–5.1)
Sodium: 139 mmol/L (ref 135–145)
TOTAL PROTEIN: 6.3 g/dL — AB (ref 6.5–8.1)

## 2016-10-27 LAB — GLUCOSE, CAPILLARY: Glucose-Capillary: 91 mg/dL (ref 65–99)

## 2016-10-27 MED ORDER — FOLIC ACID 1 MG PO TABS: 1.0000 mg | ORAL_TABLET | Freq: Every day | ORAL | 0 refills | Status: AC

## 2016-10-27 MED ORDER — TRAZODONE HCL 100 MG PO TABS: 100.0000 mg | ORAL_TABLET | Freq: Every day | ORAL | Status: DC

## 2016-10-27 MED ORDER — THIAMINE HCL 100 MG PO TABS: 100.0000 mg | ORAL_TABLET | Freq: Every day | ORAL | Status: AC

## 2016-10-27 MED ORDER — ACETAMINOPHEN 325 MG PO TABS: 650.0000 mg | ORAL_TABLET | Freq: Four times a day (QID) | ORAL | Status: AC | PRN

## 2016-10-27 MED ORDER — POLYETHYLENE GLYCOL 3350 17 G PO PACK: 17.0000 g | PACK | Freq: Every day | ORAL | 0 refills | Status: DC | PRN

## 2016-10-27 MED ORDER — SULFAMETHOXAZOLE-TRIMETHOPRIM 800-160 MG PO TABS: 1.0000 | ORAL_TABLET | Freq: Two times a day (BID) | ORAL | 0 refills | Status: AC

## 2016-10-27 NOTE — Progress Notes (Signed)
Subjective: Interval History: No seizures  Objective: Vital signs in last 24 hours: Temp:  [98.6 F (37 C)-99 F (37.2 C)] 98.6 F (37 C) (02/17 0440) Pulse Rate:  [66-125] 66 (02/17 0440) Resp:  [18-20] 18 (02/17 0440) BP: (116-130)/(65-86) 116/78 (02/17 0440) SpO2:  [98 %-100 %] 98 % (02/17 0440)  Intake/Output from previous day: 02/16 0701 - 02/17 0700 In: 840 [P.O.:840] Out: 1115 [Urine:1115] Intake/Output this shift: Total I/O In: 360 [P.O.:360] Out: 200 [Urine:200] Nutritional status: Diet regular Room service appropriate? Yes; Fluid consistency: Thin  Lab Results:  Recent Labs  10/25/16 0525 10/26/16 0531 10/27/16 0601  WBC 5.6 6.9  --   HGB 10.1* 10.2*  --   HCT 32.5* 33.2*  --   PLT 192 208  --   NA 139 138 139  K 3.9 3.6 3.5  CL 105 104 104  CO2 26 27 29   GLUCOSE 89 118* 131*  BUN 13 11 12   CREATININE 1.00 0.97 0.95  CALCIUM 9.0 9.0 9.1   Lipid Panel No results for input(s): CHOL, TRIG, HDL, CHOLHDL, VLDL, LDLCALC in the last 72 hours.  Studies/Results: Mr Brain Wo Contrast  Result Date: 10/26/2016 CLINICAL DATA:  66 y/o  M; acute confusion and agitation. EXAM: MRI HEAD WITHOUT CONTRAST TECHNIQUE: Multiplanar, multiecho pulse sequences of the brain and surrounding structures were obtained without intravenous contrast. COMPARISON:  10/24/2016 CT of the head. 01/22/2014 MRI of the brain. FINDINGS: Brain: No acute infarction, hemorrhage, hydrocephalus, extra-axial collection or mass lesion. Foci of T2 FLAIR hyperintense signal abnormality in subcortical and periventricular white matter is stable and compatible with moderate chronic microvascular ischemic changes. There is stable moderate diffuse brain parenchymal volume loss. Small foci of T2 FLAIR hyperintensity within the pons are compatible with small chronic lacunar infarcts. There are large prominent perivascular spaces in the right lentiform nucleus. Vascular: Normal flow voids. Skull and upper cervical  spine: Normal marrow signal. Sinuses/Orbits: No abnormal signal of paranasal sinuses. Underpneumatized frontal sinuses. Small right mastoid effusion. No abnormal signal of left mastoid air cells. Orbits are unremarkable. Other: None. IMPRESSION: 1. No acute intracranial abnormality. 2. Stable moderate chronic microvascular ischemic changes and parenchymal volume loss of the brain. 3. Small right mastoid effusion. Electronically Signed   By: Kristine Garbe M.D.   On: 10/26/2016 19:24    Medications:  Scheduled: . aspirin EC  81 mg Oral Daily  . baclofen  20 mg Oral QHS  . cholecalciferol  5,000 Units Oral QHS  . enoxaparin (LOVENOX) injection  40 mg Subcutaneous QHS  . folic acid  1 mg Oral Daily  . metoprolol tartrate  25 mg Oral BID  . multivitamin with minerals  1 tablet Oral QHS  . nicotine  14 mg Transdermal Daily  . pantoprazole  40 mg Oral Daily  . polyethylene glycol  17 g Oral Daily  . QUEtiapine  50 mg Oral QHS  . sertraline  50 mg Oral QHS  . sodium chloride flush  3 mL Intravenous Q12H  . sodium chloride flush  3 mL Intravenous Q12H  . sulfamethoxazole-trimethoprim  1 tablet Oral Q12H  . tamsulosin  0.4 mg Oral QHS  . thiamine  100 mg Oral Daily  . traZODone  100 mg Oral QHS    Assessment/Plan:  Encephalopathy secondary to urosepsis with >100,000 staph epidermidis.  He also has global atrophy on imaging indicative of underlying dementia which makes him decompensate faster.  No further neuro work up needed.  Will sign off.  LOS: 4 days   Rogue Jury, MD 10/27/2016  9:48 AM

## 2016-10-27 NOTE — Progress Notes (Signed)
Pt discharged to home. DC instructions given with daughter and male family member at bedside. No concerns voiced. Family encouraged to stop by pharmacy and pick up pt's meds that were e-prescribed by MD. Family voiced understanding. Daughter signed discharge summary. Left unit in wheelchair pushed by nurse tech accompanied by daughter. Left in good condition. VWilliams,rn.

## 2016-10-27 NOTE — Care Management Note (Signed)
Case Management Note  Patient Details  Name: Barry Mccoy MRN: [id number] Date of Birth: [birth year]  Subjective/Objective:        Acute encephalopathy,  UTI, Urinary Retention           Action/Plan: Discharge Planning: AVS reviewed: please see previous NCM notes.   NCM spoke to pt and dtr, [name]. Pt will be dc to his dtr's home in [facility], [phone] address, [address] [address]. Offered choice for HH/list provided. Requested [facility]. Requested RW for home. Contacted [facility] DME rep for RW. Contacted [facility] [employer] for [facility]. Provided new address to Liaison.   PCP Dena Billet B   Expected Discharge Date:  10/27/16               Expected Discharge Plan:  [place]  In-House Referral:  Clinical Social Work  Discharge planning Services  CM Consult  Post Acute Care Choice:  NA, Home Health Choice offered to:  Adult Children  DME Arranged:  [place] [facility] DME Agency:  [place] Arranged:  PT, RN, Nurse's Aide, Social Work [employer] Agency:  [facility] [employer]  Status of Service:  Completed, signed off  If discussed at [employer] [facility] [employer], dates discussed:    Additional Comments:  Erenest Rasher, RN 10/27/2016, 6:27 PM

## 2016-10-27 NOTE — Discharge Summary (Addendum)
Physician Discharge Summary  Macus Dille [id number] DOB: 1951/06/23 DOA: 10/23/2016  PCP: Karis Juba, PA-C GI: Benson Norway  Admit date: 10/23/2016 Discharge date: 10/27/2016  Admitted From: Home  Disposition:  Home with 24/7 family supervision and [employer] services RN, SW, Aide  Recommendations for Outpatient Follow-up:  1. Follow up with PCP in 1 weeks 2. Please follow up with urology in 1 week to consider void trial 3. Please follow up with GI for outpatient colonoscopy in 2 weeks and evaluate pancreatic cysts 4. Please follow up with neurologist in 2-3 weeks for neurocognitive testing and evaluation 5. Please consider MRI abdomen in 1-2 months to evaluate pancreatic cyst noted on CT  Discharge Condition: STABLE  CODE STATUS: FULL   Brief/Interim Summary: Chief Complaint: Confusion, agitation  HPI: Barry Mccoy is a 66 y.o. male with medical history significant for anxiety, depression, and alcohol abuse, presenting to the emergency department from his SNF for evaluation of acute confusion and agitation. He had just been discharged from the hospital earlier today, but left the SNF, walking out into traffic, apparently confused and agitated. IVC was placed and the patient was brought into the ED by the PD. Patient had been admitted to the hospital on 10/01/2016 with melena, dyspnea, hypotension, and syncope. He was transfused 5 units of packed red blood cells during that admission which was complicated by delirium and agitation requiring high doses of benzodiazepines which precipitated respiratory failure, necessitating intubation. He was extubated on 10/07/2016. Patient continued to be delirious and agitated, requiring 4 point restraints and pharmacologic restraint. Neurology consultation was obtained during the hospitalization and the patient was started on high-dose thiamine which has since been tapered down. Outpatient neurocognitive testing was planned. He was also evaluated by gastroenterology  with EGD notable for portal gastropathy and he was placed on Protonix daily. He was ultimately discharged to SNF with plans to continue weaning Seroquel and Haldol. Admission was also complicated by urinary retention. Voiding trial was unsuccessful prior to discharge and Foley was replaced. Patient has been kept on Flomax with outpatient urology follow-up.   ED Course: Upon arrival to the ED, patient is found to be afebrile, saturating well on room air, and with vital signs otherwise stable. EKG features sinus rhythm with PAC and QTc of 503 ms. Chemistry panel is essentially normal and CBC is notable for a hemoglobin of 10.8, up from 9.6 five days ago. Urinalysis features many bacteria, TNTC white cells, large leukocytes, and positive nitrites. Large hemoglobin is also noted on dipstick. Patient was treated with an empiric dose of Rocephin in the emergency department, remained hemodynamically stable and in no apparent respiratory distress. He will be admitted to the telemetry unit for ongoing evaluation and management of acute encephalopathy, possibly secondary to UTI.  1. Acute encephalopathy - resolved now, likely was secondary to UTI - Pt became confused at his SNF and walked out into traffic, leading to IVC being placed, now has been rescinded   - There are no focal neurologic deficits, CT head no acute findings, cerebral atrophy noted - Folate, B12, and TSH wnl during recent admission; he was treated with high-dose thiamine; HIV and RPR negative - There is UTI  on presentation; It is being treated and still having confusion, CT head without contrast No acute findings.   - Requested psychiatry consultation 2/14: See notes, also requested neurology consult 2/16.  - Pt much improved since 2/15 with safety restraints removed and not requiring PRN sedatives - Neurology was consulted to see him  2/16 and they felt that symptoms related to infection in addition to underlying neurocognitive decline (see  notes), MRI brain ordered: no acute findings, Stable moderate chronic microvascular ischemic changes and parenchymal volume loss of the brain.  Pt was advised to have outpatient neurology follow up for neurocognitive testing.  PT evaluated patient and advised no PT follow up.    2. UTI Staph species - UA consistent with UTI  - Empiric Rocephin given IV  - Urine sent for culture; prior culture grew multiple species, none isolated - Started oral Bactrim DS 2/16 based on C&S results, continue 7 more days of Bactrim DS  3. Normocytic anemia  - Hgb 10.8 on admission, up from 9.6 five days earlier  - Likely secondary to GI blood-loss as presented last month with melena and required 5 units of pRBCs; EGD was revealing of portal gastropathy  - No evidence for ongoing blood-loss - Continue daily Protonix   4. Urinary retention  - Failed voiding trial prior to recent discharge  - Continue Flomax, continue catheter care, follow up with urology outpatient after discharge, consider voiding trial in 1 week.   -  I had a discussion with patient about doing a voiding trial before discharge, he was concerned that he would fail the trial and have to have the foley replaced like he did last week so he opted to keep the foley in place and follow up with urology in 1 week for a void trial, he will continue taking the flomax.    5. Portal gastropathy, probable cirrhosis  - Gastropathy noted on EGD during recent admission and CT findings suggestive of cirrhosis  - There is history of alcohol use per history - Continue daily Protonix - Follow up with GI in 2 weeks for consideration of colonoscopy and cirrhosis care.   - Pt should have MRI abdomen in 1-2 months to follow up on pancreatic cyst seen on CT scan.     6. Prolonged QTc  - QTc 503 ms on admission - Pt is on numerous offending agents, will attempt to minimize - Replace potassium to 4 and magnesium to 2 - He had been monitored on telemetry but  has been stable, DC'd tele 2/16  DVT prophylaxis:sq Lovenox  Code Status:Full  Disposition Plan:Daughter planning to take home for 24/7 family supervision Consults called:Psychiatry and Neurology Admission status:Inpatient   Consultants:  Psychiatry  Neurology  Discharge Diagnoses:  Principal Problem:   Acute encephalopathy Active Problems:   Normocytic anemia   UTI (urinary tract infection)   Urinary retention  Discharge Instructions  Discharge Instructions    Increase activity slowly    Complete by:  As directed      Allergies as of 10/27/2016      Reactions   Shellfish Allergy Other (See Comments)   Unknown       Medication List    STOP taking these medications   diazepam 5 MG tablet Commonly known as:  VALIUM   haloperidol 1 MG tablet Commonly known as:  HALDOL   lidocaine 5 % Commonly known as:  LIDODERM   Testosterone 20.25 MG/ACT (1.62%) Gel   traMADol 300 MG 24 hr tablet Commonly known as:  ULTRAM-ER   UNABLE TO FIND   varenicline 0.5 MG tablet Commonly known as:  CHANTIX   varenicline 1 MG tablet Commonly known as:  CHANTIX CONTINUING MONTH PAK     TAKE these medications   acetaminophen 325 MG tablet Commonly known as:  TYLENOL Take 2  tablets (650 mg total) by mouth every 6 (six) hours as needed for mild pain, moderate pain or headache (or Fever >/= 101).   aspirin EC 81 MG tablet Take 81 mg by mouth daily.   baclofen 20 MG tablet Commonly known as:  LIORESAL TAKE 1 TABLET BY MOUTH EVERY 12 HOURS AS NEEDED FOR SPASMS   baclofen 20 MG tablet Commonly known as:  LIORESAL Take 1 tablet (20 mg total) by mouth at bedtime.   BOTOX IJ Inject as directed See admin instructions. Botox injections done at Dr. Audery Amel office approximately every 90 days   folic acid 1 MG tablet Commonly known as:  FOLVITE Take 1 tablet (1 mg total) by mouth daily. Start taking on:  10/28/2016   metoprolol tartrate 25 MG tablet Commonly known as:   LOPRESSOR Take 1 tablet (25 mg total) by mouth 2 (two) times daily.   multivitamin with minerals Tabs tablet Take 1 tablet by mouth at bedtime.   nicotine 14 mg/24hr patch Commonly known as:  NICODERM CQ - dosed in mg/24 hours Place 1 patch (14 mg total) onto the skin daily.   pantoprazole 40 MG tablet Commonly known as:  PROTONIX Take 1 tablet (40 mg total) by mouth daily.   polyethylene glycol packet Commonly known as:  MIRALAX / GLYCOLAX Take 17 g by mouth daily as needed for mild constipation.   QUEtiapine 50 MG tablet Commonly known as:  SEROQUEL Take 1 tablet (50 mg total) by mouth at bedtime.   sertraline 50 MG tablet Commonly known as:  ZOLOFT Take 50 mg by mouth at bedtime.   sulfamethoxazole-trimethoprim 800-160 MG tablet Commonly known as:  BACTRIM DS,SEPTRA DS Take 1 tablet by mouth every 12 (twelve) hours.   tamsulosin 0.4 MG Caps capsule Commonly known as:  FLOMAX Take 0.4 mg by mouth at bedtime.   thiamine 100 MG tablet Take 1 tablet (100 mg total) by mouth daily. For 4days then down to 100mg  daily What changed:  how much to take   traZODone 100 MG tablet Commonly known as:  DESYREL Take 1 tablet (100 mg total) by mouth at bedtime. What changed:  how much to take   Vitamin D-3 5000 units Tabs Take 5,000 Units by mouth at bedtime.      [facility] Follow up in 1 week(s).   Why:  Establish Care, hospital follow up regarding urinary retention Contact information: [address]  [place] [address] [place] [address] [phone]        [facility]. Schedule an appointment as soon as possible for a visit in 2 week(s).   Why:  Hospital Follow up to have neurocognitive testing and establish care.  Contact information: [place], [place]ook Garnavillo D, MD. Schedule an appointment as soon as possible for a visit in 2 week(s).    Specialty:  Gastroenterology Why:  Hospital Follow Up needs colonoscopy scheduled Contact information: [address] [place] [address] [phone]        Primary Care Provider. Schedule an appointment as soon as possible for a visit in 1 week(s).   Why:  Hospital Follow Up          Allergies  Allergen Reactions  . Shellfish Allergy Other (See Comments)    Unknown    Procedures/Studies: Ct Head Wo Contrast  Result Date: 10/24/2016 CLINICAL DATA:  Anxiety, depression, alcohol abuse,  confusion, agitation EXAM: CT HEAD WITHOUT CONTRAST TECHNIQUE: Contiguous axial images were obtained from the base of the skull through the vertex without intravenous contrast. COMPARISON:  Brain MRI 01/22/2014 FINDINGS: Brain: No intracranial hemorrhage, mass effect or midline shift. No acute cortical infarction. Stable cerebral atrophy. Stable periventricular and patchy subcortical chronic white matter disease. Prominent perivascular space or lacunar infarct in right basal ganglia is stable. No mass lesion is noted on this unenhanced scan. Ventricular size is stable from prior exam. Vascular: Mild atherosclerotic calcifications of carotid siphon. Skull: No skull fracture is noted. Sinuses/Orbits: No acute findings. Other: None IMPRESSION: No acute intracranial abnormality. Stable cerebral atrophy. Stable periventricular and patchy subcortical white matter decreased attenuation probable due to chronic small vessel ischemic changes. No definite acute cortical infarction. Stable prominent perivascular space or old lacunar infarct in right basal ganglia. Electronically Signed   By: Lahoma Crocker M.D.   On: 10/24/2016 11:30   Ct Head Wo Contrast  Result Date: 10/14/2016 CLINICAL DATA:  Altered mental status. EXAM: CT HEAD WITHOUT CONTRAST TECHNIQUE: Contiguous axial images were obtained from the base of the skull through the vertex without intravenous contrast. COMPARISON:  10/03/2016 FINDINGS:  Brain: No evidence of acute infarction, hemorrhage, hydrocephalus, or mass lesion/mass effect. Small hygroma posterior to the left cerebellum is stable at 4 mm maximal thickness. No significant mass effect. Stable pattern of mild chronic microvascular disease. Dilated perivascular space versus remote lacune along the lower right putamen. Vascular: Atherosclerotic calcification.  No hyperdense vessel. Skull: No acute finding Sinuses/Orbits: Negative IMPRESSION: No acute finding or change from prior. Electronically Signed   By: Monte Fantasia M.D.   On: 10/14/2016 16:04   Ct Head Wo Contrast  Result Date: 10/03/2016 CLINICAL DATA:  66 year old male with acute encephalopathy. EXAM: CT HEAD WITHOUT CONTRAST TECHNIQUE: Contiguous axial images were obtained from the base of the skull through the vertex without intravenous contrast. COMPARISON:  None. FINDINGS: Brain: The ventricles and sulci appropriate in size for patient's age. Mild periventricular and deep white matter chronic microvascular ischemic changes noted. A focal area of hypodensity in the right occipital subcortical white matter likely represent chronic microvascular ischemic changes. 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 There is no acute intracranial hemorrhage. No mass effect or midline shift noted. No extra-axial fluid collection. Vascular: No hyperdense vessel or unexpected calcification. Skull: Normal. Negative for fracture or focal lesion. Sinuses/Orbits: No acute finding. Other: Partially visualized support tubes in the mouth. IMPRESSION: No acute intracranial hemorrhage. Mild chronic microvascular ischemic changes. If symptoms persist and there are no contraindications, MRI may provide better evaluation if clinically indicated. Electronically Signed   By: Anner Crete M.D.   On: 10/03/2016 01:28   Ct Angio Chest Pe W Or Wo Contrast  Result Date: 10/14/2016 CLINICAL DATA:  Hypoxia, agitation, SVT, elevated D-dimer. EXAM: CT  ANGIOGRAPHY CHEST WITH CONTRAST TECHNIQUE: Multidetector CT imaging of the chest was performed using the standard protocol during bolus administration of intravenous contrast. Multiplanar CT image reconstructions and MIPs were obtained to evaluate the vascular anatomy. CONTRAST:  100 cc Isovue 370 COMPARISON:  None. FINDINGS: Cardiovascular: Beam hardening artifact, presumably from patient's arms, considerably limits characterization of the peripheral segmental and subsegmental pulmonary artery branches to the lower lobes bilaterally. I cannot exclude small peripheral pulmonary embolism. There is no pulmonary embolism identified within the main, lobar or central segmental pulmonary arteries bilaterally. Heart size is normal. No pericardial effusion. No aortic aneurysm or dissection. Mild atherosclerotic changes noted along the walls  of the descending thoracic aorta. Coronary artery calcifications noted. Mediastinum/Nodes: No mass or enlarged lymph nodes within the mediastinum or perihilar regions. Esophagus appears normal. Trachea and central bronchi are unremarkable. Lungs/Pleura: Small right pleural effusion with adjacent compressive atelectasis. Mild atelectasis at the left lung base. Hazy ground-glass opacities within the upper lobes bilaterally, likely additional atelectasis or perhaps mild edema. No pneumothorax. 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 Upper Abdomen: Free intraperitoneal air. Small amount of free fluid in the left upper quadrant. Mass versus fluid collection adjacent to the pancreatic tail, incompletely imaged. Multiple gallstones within the gallbladder, incompletely imaged. Musculoskeletal: No acute or suspicious osseous lesion. Mild degenerative spurring within the thoracic spine. Superficial soft tissues are unremarkable. Review of the MIP images confirms the above findings. IMPRESSION: 1. Free intraperitoneal air within the upper abdomen. In the  absence of a recent surgery, this suggests viscus perforation. CT abdomen and pelvis is recommended for further characterization. 2. No pulmonary embolism seen, with study limitations detailed above. 3. Small right pleural effusion. Mild bibasilar atelectasis. Mild atelectasis versus edema within the upper lobes bilaterally. No evidence of pneumonia. 4. Free fluid in the left upper abdomen. Mass versus fluid collection adjacent to the pancreatic tail, incompletely imaged. Cholelithiasis, incompletely imaged. 5. Aortic atherosclerosis. Critical Value/emergent results were called by telephone at the time of interpretation on 10/14/2016 at 4:25 pm to Dr. Irine Seal , who verbally acknowledged these results. Electronically Signed   By: Franki Cabot M.D.   On: 10/14/2016 16:29   Mr Brain Wo Contrast  Result Date: 10/26/2016 CLINICAL DATA:  66 y/o  M; acute confusion and agitation. EXAM: MRI HEAD WITHOUT CONTRAST TECHNIQUE: Multiplanar, multiecho pulse sequences of the brain and surrounding structures were obtained without intravenous contrast. COMPARISON:  10/24/2016 CT of the head. 01/22/2014 MRI of the brain. FINDINGS: Brain: No acute infarction, hemorrhage, hydrocephalus, extra-axial collection or mass lesion. Foci of T2 FLAIR hyperintense signal abnormality in subcortical and periventricular white matter is stable and compatible with moderate chronic microvascular ischemic changes. There is stable moderate diffuse brain parenchymal volume loss. Small foci of T2 FLAIR hyperintensity within the pons are compatible with small chronic lacunar infarcts. There are large prominent perivascular spaces in the right lentiform nucleus. Vascular: Normal flow voids. Skull and upper cervical spine: Normal marrow signal. Sinuses/Orbits: No abnormal signal of paranasal sinuses. Underpneumatized frontal sinuses. Small right mastoid effusion. No abnormal signal of left mastoid air cells. Orbits are unremarkable. Other: None.  IMPRESSION: 1. No acute intracranial abnormality. 2. Stable moderate chronic microvascular ischemic changes and parenchymal volume loss of the brain. 3. Small right mastoid effusion. Electronically Signed   By: Kristine Garbe M.D.   On: 10/26/2016 19:24   Ct Abdomen Pelvis W Contrast  Addendum Date: 10/14/2016   ADDENDUM REPORT: 10/14/2016 21:30 ADDENDUM: Findings discussed with K. Schorr 10/14/2016  at 9:30 pm. Electronically Signed   By: Monte Fantasia M.D.   On: 10/14/2016 21:30   Result Date: 10/14/2016 CLINICAL DATA:  Follow-up chest CT.  Free air. EXAM: CT ABDOMEN AND PELVIS WITH CONTRAST TECHNIQUE: Multidetector CT imaging of the abdomen and pelvis was performed using the standard protocol following bolus administration of intravenous contrast. CONTRAST:  34mL ISOVUE-300 IOPAMIDOL (ISOVUE-300) INJECTION 61% COMPARISON:  None. FINDINGS: Lower chest: Described separately. Small pleural effusions and atelectasis Hepatobiliary: Low-density along the falciform ligament is likely perfusion anomaly. Cannot exclude cirrhosis. The liver surface is lobulated. Cholelithiasis. No signs of acute cholecystitis. Pancreas: Cystic density of the  pancreatic tail measuring 26 mm. No surrounding inflammatory changes. There is a coarse calcification and neighboring pancreatic tail which is somewhat full. Some this fullness may be related to venous collaterals. Spleen: Enlarged this 17 cm span. This is likely related to chronic splenic vein occlusion. There are well-formed venous collaterals, including proximal gastric varices. Adrenals/Urinary Tract: Negative adrenals. No hydronephrosis or stone. Urinary bladder predominately decompressed by Foley catheter. Stomach/Bowel: Numerous colonic diverticula. There is question of diverticulitis at the splenic flexure, but no noted gas in this region to explain pneumoperitoneum. No inflammatory changes noted around the stomach or proximal duodenum. No visible ulcer. Proximal  gastric varices. Negative appendix. Vascular/Lymphatic: No acute vascular abnormality. Splenic vein occlusion as described. Extensive atherosclerosis. No mass or adenopathy. Reproductive:No pathologic findings. Other: Moderate volume pneumoperitoneum, maximal in the upper ventral abdomen, but also present in the left lower quadrant. Musculoskeletal:  Degenerative changes.  No acute abnormalities. Findings are known based on previous recorded communications. Mid level has been paged to make aware of the completed scan. IMPRESSION: 1. No definitive source for the patient's moderate pneumoperitoneum. Given there is no inflammatory changes or visible ulcer along the stomach or proximal duodenum, a diverticular source is favored. There may be mild diverticulitis along the splenic flexure, but no extraluminal gas in this region. 2. 26 mm fluid collection in the splenic fossa. Suspect pseudocyst as there is ductal or parenchymal calcification in the pancreatic tail. Pancreatic tail is full and pancreas protocol CT or MRI follow-up is recommended after convalescence to exclude mass. 3. 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 Patient was admitted with GI bleeding. 4. Cholelithiasis. 5. Suspected cirrhosis. 6. Splenomegaly. Electronically Signed: By: Monte Fantasia M.D. On: 10/14/2016 21:19   Dg Chest Port 1 View  Result Date: 10/11/2016 CLINICAL DATA:  Respiratory failure, GI bleeding, encephalopathy, current smoker. EXAM: PORTABLE CHEST 1 VIEW COMPARISON:  Portable chest x-ray of October 07, 2016 FINDINGS: The lungs are well-expanded. The interstitial markings remain increased. The hemidiaphragms are now all well demonstrated. Basilar parenchymal density has markedly improved. The heart remains mildly enlarged. The central pulmonary vascularity remains engorged. The trachea is midline. The bony thorax exhibits no acute abnormality. IMPRESSION: Improved bibasilar atelectasis or  pneumonia. Persistent pulmonary interstitial edema and pulmonary vascular congestion consistent with CHF. Electronically Signed   By: David  Martinique M.D.   On: 10/11/2016 07:06   Dg Chest Port 1 View  Result Date: 10/07/2016 CLINICAL DATA:  Acute respiratory failure EXAM: PORTABLE CHEST 1 VIEW COMPARISON:  10/06/2016 FINDINGS: Support devices are unchanged. Mild cardiomegaly. Bilateral airspace opacities and probable layering effusions. Overall aeration may have decreased slightly since prior study. IMPRESSION: Bilateral airspace disease and layering effusions, slightly worsened since prior study. Electronically Signed   By: Rolm Baptise M.D.   On: 10/07/2016 07:20   Dg Chest Port 1 View  Result Date: 10/06/2016 CLINICAL DATA:  Endotracheal tube EXAM: PORTABLE CHEST 1 VIEW COMPARISON:  10/05/2016 FINDINGS: Endotracheal tube 2 cm above the carina. Right jugular central venous catheter tip in the SVC. Feeding tube in place with the tip not visualized Bibasilar airspace disease similar to yesterday. Small right effusion. No pneumothorax IMPRESSION: Endotracheal tube 2 cm above the carina Bibasilar airspace disease unchanged from the prior study. Electronically Signed   By: Franchot Gallo M.D.   On: 10/06/2016 07:08   Dg Chest Port 1 View  Result Date: 10/05/2016 CLINICAL DATA:  Check endotracheal tube EXAM: PORTABLE CHEST 1 VIEW COMPARISON:  10/04/2016 FINDINGS: Endotracheal  tube and right jugular central line are again seen and stable. Cardiac shadow is within normal limits. Patients significant rotated to the right accentuating the mediastinal markings. Small bilateral pleural effusions are noted. No other focal infiltrate is seen. IMPRESSION: Endotracheal tube in satisfactory position. Small bilateral pleural effusions are noted. Electronically Signed   By: Inez Catalina M.D.   On: 10/05/2016 08:44   Dg Chest Port 1 View  Result Date: 10/04/2016 CLINICAL DATA:  Post central line placement EXAM:  PORTABLE CHEST 1 VIEW COMPARISON:  Portable exam 1232 hours compared to 10/07/2016 at 0524 hours FINDINGS: Tip of endotracheal tube projects 7.0 cm above carina. New RIGHT jugular central venous catheter with tip projecting over SVC. Stable heart size and mediastinal contours. Bronchitic changes and accentuated perihilar markings stable. Bibasilar atelectasis. LEFT lateral costophrenic angle excluded. No new infiltrate or pneumothorax. Bones demineralized. IMPRESSION: No pneumothorax following RIGHT jugular line placement. Bronchitic changes with bibasilar atelectasis. Electronically Signed   By: Lavonia Dana M.D.   On: 10/04/2016 12:48   Dg Chest Port 1 View  Result Date: 10/04/2016 CLINICAL DATA:  Intubation. EXAM: PORTABLE CHEST 1 VIEW COMPARISON:  10/03/2016. FINDINGS: Endotracheal tube in stable position. Stable cardiomegaly. No pulmonary venous congestion. Progressive dense right lower lobe atelectasis. Mild left base subsegmental atelectasis. Small bilateral pleural effusions cannot be excluded. IMPRESSION: 1. Endotracheal tube in stable position. 2. Low lung volumes. Progressive dense right lower lobe atelectasis. Improved aeration of the left base with mild residual infiltrate/edema. 3. Small bilateral pleural effusions . Electronically Signed   By: Marcello Moores  Register   On: 10/04/2016 07:09   Dg Chest Port 1 View  Result Date: 10/03/2016 CLINICAL DATA:  Hypoxia, intubated, smoker EXAM: PORTABLE CHEST 1 VIEW COMPARISON:  Portable exam 0901 hours compared to 10/02/2016 FINDINGS: Tip of endotracheal tube projects 3.3 cm above carina. Enlargement of cardiac silhouette. Mediastinal contours and pulmonary vascularity normal. Persistent LEFT lower lobe infiltrate question pneumonia. Central peribronchial thickening with question minimal atelectasis or infiltrate at RIGHT base. Upper lungs clear. No definite pleural effusion or pneumothorax. IMPRESSION: Persistent LEFT lower lobe infiltrate with question  minimal atelectasis versus infiltrate RIGHT base. Electronically Signed   By: Lavonia Dana M.D.   On: 10/03/2016 09:11   Portable Chest Xray  Result Date: 10/02/2016 CLINICAL DATA:  Hypoxia EXAM: PORTABLE CHEST 1 VIEW COMPARISON:  Chest CT October 01, 2016 FINDINGS: Endotracheal tube tip is 2.5 cm above the carina. Nasogastric tube tip and side port are below the diaphragm. No pneumothorax. There is patchy consolidation in the left lower lobe. Lungs elsewhere are clear. There is mild cardiomegaly with pulmonary venous hypertension. No evident adenopathy. No bone lesions. IMPRESSION: Tube positions as described without pneumothorax. Left lower lobe consolidation, likely pneumonia. There is cardiomegaly with pulmonary venous hypertension indicative of a degree of pulmonary vascular congestion. Electronically Signed   By: Lowella Grip III M.D.   On: 10/02/2016 17:30   Dg Chest Port 1v Same Day  Result Date: 10/04/2016 CLINICAL DATA:  Evaluate ET tube placement. EXAM: PORTABLE CHEST 1 VIEW COMPARISON:  Chest radiograph earlier same day. FINDINGS: ET tube terminates in the mid to distal trachea. Monitoring leads overlie the patient. Right IJ central venous catheter tip projects over the superior vena cava. Stable cardiac and mediastinal contours, enlarged. Grossly unchanged mid lower lung airspace opacities. Probable small bilateral layering pleural effusions. IMPRESSION: ET tube terminates in the mid to distal trachea. Electronically Signed   By: Lovey Newcomer M.D.   On: 10/04/2016  20:24   Dg Chest Port 1v Same Day  Result Date: 10/04/2016 CLINICAL DATA:  Patient status post intubation. EXAM: PORTABLE CHEST 1 VIEW COMPARISON:  Chest radiograph 10/04/2016. FINDINGS: Right IJ central venous catheter tip projects over the superior vena cava. The ET tube is visualized to the distal trachea. The distal aspect of the ET tube is not well demonstrated. Monitoring leads overlie the patient. Stable enlarged cardiac  and mediastinal contours. Grossly unchanged bilateral mid and lower lung, right-greater-than-left heterogeneous opacities. Possible small layering bilateral pleural effusions. IMPRESSION: ET tube is visualized to the distal trachea. Given technique, the distal aspect of the ET tube is not demonstrated. Recommend repeat evaluation. Re- demonstrated bilateral mid and lower lung heterogeneous opacities. These results will be called to the ordering clinician or representative by the Radiologist Assistant, and communication documented in the PACS or zVision Dashboard. Electronically Signed   By: Lovey Newcomer M.D.   On: 10/04/2016 18:53   Dg Abd Portable 1v  Result Date: 10/08/2016 CLINICAL DATA:  Feeding tube placement. EXAM: PORTABLE ABDOMEN - 1 VIEW COMPARISON:  10/05/2016 FINDINGS: The feeding tube tip is at the junction of the second and third portions of the duodenum. Air throughout the small bowel and colon suggesting an ileus. IMPRESSION: Feeding tube tip is in the distal descending duodenum. Electronically Signed   By: Marijo Sanes M.D.   On: 10/08/2016 12:53   Dg Abd Portable 1v  Result Date: 10/05/2016 CLINICAL DATA:  Feeding tube placement EXAM: PORTABLE ABDOMEN - 1 VIEW COMPARISON:  10/03/2016 FINDINGS: Feeding tube tip is in the descending duodenum. IMPRESSION: Feeding tube tip in the descending duodenum. Electronically Signed   By: Rolm Baptise M.D.   On: 10/05/2016 11:30   Dg Abd Portable 1v  Result Date: 10/03/2016 CLINICAL DATA:  Assess orogastric tube placement. EXAM: PORTABLE ABDOMEN - 1 VIEW COMPARISON:  Abdominal radiograph of October 02, 2016 FINDINGS: The orogastric tube tip lies in the mid gastric body. The proximal port lies just below the Barry junction. The observed bowel gas pattern is unremarkable. IMPRESSION: Positioning of the orogastric tube since at the proximal port is just below the expected location of the Barry junction. Advancement by 5-10 cm is recommended to assure that the  proximal port remains below the Barry junction. Electronically Signed   By: David  Martinique M.D.   On: 10/03/2016 07:57   Dg Abd Portable 1v  Result Date: 10/02/2016 CLINICAL DATA:  Nasogastric tube placement EXAM: PORTABLE ABDOMEN - 1 VIEW COMPARISON:  None. FINDINGS: Nasogastric tube tip and side port are in the stomach. There is no bowel dilatation or air-fluid level suggesting bowel obstruction. No free air. IMPRESSION: Nasogastric tube tip and side port in stomach. Bowel gas pattern unremarkable. Electronically Signed   By: Lowella Grip III M.D.   On: 10/02/2016 17:28   Dg Swallowing Func-speech Pathology  Result Date: 10/20/2016 Objective Swallowing Evaluation: Type of Study: MBS-Modified Barium Swallow Study Patient Details Name: Barry Mccoy MRN: [id number] Date of Birth: September 06, 1951 Today's Date: 10/20/2016 Time: SLP Start Time (ACUTE ONLY): 1110-SLP Stop Time (ACUTE ONLY): 1125 SLP Time Calculation (min) (ACUTE ONLY): 15 min Past Medical History: Past Medical History: Diagnosis Date . Muscle spasm  Past Surgical History: Past Surgical History: Procedure Laterality Date . ELBOW SURGERY   . ESOPHAGOGASTRODUODENOSCOPY N/A 10/04/2016  Procedure: ESOPHAGOGASTRODUODENOSCOPY (EGD);  Surgeon: Carol Ada, MD;  Location: [facility];  Service: Gastroenterology;  Laterality: N/A; HPI: 66 year old male admitted 10/01/16 due to near syncope, AMS, acute  encephalopathy and intermittent hypoxia. Pt intubated 1/24-28/18 due to respiratory distress. UGI 10/04/16 - hypertensive gastropathy vs inflammatory gastritis. CXR 10/07/16 - bilateral airspace disease. PMH unremarkable. No Data Recorded Assessment / Plan / Recommendation CHL IP CLINICAL IMPRESSIONS 10/20/2016 Therapy Diagnosis -- Clinical Impression Pt's swallow ability has improved from previous MBS. Mild sensorimotor (motor>sensory) oral and pharyngeal dysphagia. Laryngeal penetration present with thin, mostly flash although minimal amount remained in vestibule  following large and consecutive sips thin in which penetration occured before the swallow. Delayed oral transit (thin) and prolonged mastication with solid texture. Reduced tongue base retraction and decreased laryngeal elevation led to mild valleculae (mostly) and pyriform sinus residue. Verbal cues for second swallow cleared majority of residue. Smaller cup sips mitigated occurance of penetration. Recommend he continue with Dys 2 texture however upgrade liquids to thin, small sips, straws allowed and swallow twice after bites/sips.    Impact on safety and function --   CHL IP TREATMENT RECOMMENDATION 10/19/2016 Treatment Recommendations Therapy as outlined in treatment plan below   Prognosis 10/19/2016 Prognosis for Safe Diet Advancement Good Barriers to Reach Goals Cognitive deficits Barriers/Prognosis Comment -- CHL IP DIET RECOMMENDATION 10/19/2016 SLP Diet Recommendations Dysphagia 2 (Fine chop) solids;Thin liquid Liquid Administration via Cup;No straw Medication Administration Whole meds with puree Compensations Slow rate;Minimize environmental distractions;Small sips/bites Postural Changes Seated upright at 90 degrees   CHL IP OTHER RECOMMENDATIONS 10/19/2016 Recommended Consults -- Oral Care Recommendations Oral care BID Other Recommendations --   CHL IP FOLLOW UP RECOMMENDATIONS 10/19/2016 Follow up Recommendations Other (comment)   CHL IP FREQUENCY AND DURATION 10/19/2016 Speech Therapy Frequency (ACUTE ONLY) min 2x/week Treatment Duration 2 weeks      CHL IP ORAL PHASE 10/19/2016 Oral Phase Impaired Oral - Pudding Teaspoon -- Oral - Pudding Cup -- Oral - Honey Teaspoon -- Oral - Honey Cup -- Oral - Nectar Teaspoon -- Oral - Nectar Cup -- Oral - Nectar Straw NT Oral - Thin Teaspoon -- Oral - Thin Cup WFL Oral - Thin Straw WFL Oral - Puree NT Oral - Mech Soft NT Oral - Regular Lingual/palatal residue Oral - Multi-Consistency -- Oral - Pill -- Oral Phase - Comment --  CHL IP PHARYNGEAL PHASE 10/19/2016 Pharyngeal Phase  Impaired Pharyngeal- Pudding Teaspoon -- Pharyngeal -- Pharyngeal- Pudding Cup -- Pharyngeal -- Pharyngeal- Honey Teaspoon -- Pharyngeal -- Pharyngeal- Honey Cup -- Pharyngeal -- Pharyngeal- Nectar Teaspoon -- Pharyngeal -- Pharyngeal- Nectar Cup NT Pharyngeal -- Pharyngeal- Nectar Straw NT Pharyngeal -- Pharyngeal- Thin Teaspoon -- Pharyngeal -- Pharyngeal- Thin Cup Penetration/Aspiration during swallow;Penetration/Aspiration before swallow;Pharyngeal residue - valleculae;Pharyngeal residue - pyriform;Reduced laryngeal elevation;Reduced tongue base retraction;Delayed swallow initiation-pyriform sinuses Pharyngeal Material enters airway, remains ABOVE vocal cords then ejected out;Material enters airway, remains ABOVE vocal cords and not ejected out Pharyngeal- Thin Straw Delayed swallow initiation-pyriform sinuses;Pharyngeal residue - valleculae;Pharyngeal residue - pyriform;Reduced tongue base retraction;Reduced laryngeal elevation Pharyngeal Material does not enter airway Pharyngeal- Puree NT Pharyngeal -- Pharyngeal- Mechanical Soft NT Pharyngeal -- Pharyngeal- Regular WFL Pharyngeal -- Pharyngeal- Multi-consistency -- Pharyngeal -- Pharyngeal- Pill -- Pharyngeal -- Pharyngeal Comment --  CHL IP CERVICAL ESOPHAGEAL PHASE 10/19/2016 Cervical Esophageal Phase (No Data) Pudding Teaspoon -- Pudding Cup -- Honey Teaspoon -- Honey Cup -- Nectar Teaspoon -- Nectar Cup -- Nectar Straw -- Thin Teaspoon -- Thin Cup -- Thin Straw -- Puree -- Mechanical Soft -- Regular -- Multi-consistency -- Pill -- Cervical Esophageal Comment -- No flowsheet data found. [name] 10/20/2016, 2:34 PM [name].Ed CCC-SLP  Pager [phone]              Dg Swallowing Func-speech Pathology  Result Date: 10/17/2016 Objective Swallowing Evaluation: Type of Study: MBS-Modified Barium Swallow Study Patient Details Name: Barry Mccoy MRN: [id number] Date of Birth: 15-Feb-1951 Today's Date: 10/17/2016 Time: SLP Start Time (ACUTE ONLY):  1145-SLP Stop Time (ACUTE ONLY): 1202 SLP Time Calculation (min) (ACUTE ONLY): 17 min Past Medical History: Past Medical History: Diagnosis Date . Muscle spasm  Past Surgical History: Past Surgical History: Procedure Laterality Date . ELBOW SURGERY   . ESOPHAGOGASTRODUODENOSCOPY N/A 10/04/2016  Procedure: ESOPHAGOGASTRODUODENOSCOPY (EGD);  Surgeon: Carol Ada, MD;  Location: [facility];  Service: Gastroenterology;  Laterality: N/A; HPI: 66 year old male admitted 10/01/16 due to near syncope, AMS, acute encephalopathy and intermittent hypoxia. Pt intubated 1/24-28/18 due to respiratory distress. UGI 10/04/16 - hypertensive gastropathy vs inflammatory gastritis. CXR 10/07/16 - bilateral airspace disease. PMH unremarkable. No Data Recorded Assessment / Plan / Recommendation CHL IP CLINICAL IMPRESSIONS 10/17/2016 Therapy Diagnosis Mild pharyngeal phase dysphagia;Mild oral phase dysphagia Clinical Impression Pt demonstrates a mild oropharyngeal dysphagia, likely secondary to sluggish motor control and lethargy associated with this acute illness. Pt difficult to arouse for MBS, continues to be dysarthric and impulsive with self feeding. Primary problem is impulsive, overlarge intake of cup and straw sips of thin liquids leading to premature spillage into the vestibule and delayed swallow initiation with penetration before the swallow. Penetration events are silent, but clear with a cued throat clear. Pts dentures were not present for this test, but mastication of solids was low, requiring verbal cues. Recommend pt continue current diet recommendation of nectar thick liquids and dys 2(fine chopped solids) until mentation improves and arousal is consistent. Pt should not remain on nectar thick liquids long term as dysphagia is relatively mild. Will discuss with pts daughter.  Impact on safety and function Mild aspiration risk   CHL IP TREATMENT RECOMMENDATION 10/17/2016 Treatment Recommendations Therapy as outlined in treatment  plan below   Prognosis 10/17/2016 Prognosis for Safe Diet Advancement Good Barriers to Reach Goals Cognitive deficits Barriers/Prognosis Comment -- CHL IP DIET RECOMMENDATION 10/17/2016 SLP Diet Recommendations Dysphagia 2 (Fine chop) solids;Nectar thick liquid Liquid Administration via Cup;Straw Medication Administration Whole meds with puree Compensations Slow rate;Small sips/bites Postural Changes Seated upright at 90 degrees   CHL IP OTHER RECOMMENDATIONS 10/17/2016 Recommended Consults -- Oral Care Recommendations Oral care BID Other Recommendations Order thickener from pharmacy   CHL IP FOLLOW UP RECOMMENDATIONS 10/17/2016 Follow up Recommendations Skilled Nursing facility   [facility] IP FREQUENCY AND DURATION 10/17/2016 Speech Therapy Frequency (ACUTE ONLY) min 2x/week Treatment Duration 2 weeks      CHL IP ORAL PHASE 10/17/2016 Oral Phase Impaired Oral - Pudding Teaspoon -- Oral - Pudding Cup -- Oral - Honey Teaspoon -- Oral - Honey Cup -- Oral - Nectar Teaspoon -- Oral - Nectar Cup -- Oral - Nectar Straw WFL Oral - Thin Teaspoon -- Oral - Thin Cup Premature spillage Oral - Thin Straw Premature spillage Oral - Puree Decreased bolus cohesion;Delayed oral transit Oral - Mech Soft Decreased bolus cohesion;Delayed oral transit;Lingual/palatal residue;Impaired mastication Oral - Regular -- Oral - Multi-Consistency -- Oral - Pill -- Oral Phase - Comment --  CHL IP PHARYNGEAL PHASE 10/17/2016 Pharyngeal Phase Impaired Pharyngeal- Pudding Teaspoon -- Pharyngeal -- Pharyngeal- Pudding Cup -- Pharyngeal -- Pharyngeal- Honey Teaspoon -- Pharyngeal -- Pharyngeal- Honey Cup -- Pharyngeal -- Pharyngeal- Nectar Teaspoon -- Pharyngeal -- Pharyngeal- Nectar Cup WFL Pharyngeal -- Pharyngeal- Nectar Straw  WFL Pharyngeal -- Pharyngeal- Thin Teaspoon -- Pharyngeal -- Pharyngeal- Thin Cup Delayed swallow initiation-pyriform sinuses;Penetration/Aspiration before swallow Pharyngeal Material enters airway, remains ABOVE vocal cords then ejected  out;Material does not enter airway;Material enters airway, CONTACTS cords and then ejected out Pharyngeal- Thin Straw Delayed swallow initiation-pyriform sinuses;Penetration/Aspiration before swallow Pharyngeal Material enters airway, CONTACTS cords and not ejected out Pharyngeal- Puree WFL Pharyngeal -- Pharyngeal- Mechanical Soft WFL Pharyngeal -- Pharyngeal- Regular -- Pharyngeal -- Pharyngeal- Multi-consistency -- Pharyngeal -- Pharyngeal- Pill -- Pharyngeal -- Pharyngeal Comment --  No flowsheet data found. No flowsheet data found. Herbie [place], [place] CCC-SLP [phone] Othelia Pulling Katherene Ponto 10/17/2016, 2:07 PM              Ct Angio Chest/abd/pel For Dissection W And/or Wo Contrast  Result Date: 10/01/2016 CLINICAL DATA:  66 year old male with left-sided flank pain, concerning for dissection. EXAM: CT ANGIOGRAPHY CHEST, ABDOMEN AND PELVIS TECHNIQUE: Multidetector CT imaging through the chest, abdomen and pelvis was performed using the standard protocol during bolus administration of intravenous contrast. Multiplanar reconstructed images including MIPs were obtained and reviewed to evaluate the vascular anatomy. CONTRAST:  100 cc Isovue 370 COMPARISON:  None. FINDINGS: CTA CHEST FINDINGS Cardiovascular: Heart: No cardiomegaly. No pericardial fluid/thickening. Calcifications of the left main, left anterior descending, circumflex coronary arteries. Aorta: Unremarkable course, caliber, contour of the thoracic aorta. No aneurysm or dissection flap. No periaortic fluid. Soft plaque of the aortic arch with minimal calcifications. Mixed atherosclerotic and soft plaque of the descending thoracic aorta. Pulmonary arteries: No central, lobar, segmental, or proximal subsegmental filling defects. Mediastinum/Nodes: Mediastinal lymph nodes are present, none of which are enlarged by CT size criteria. Unremarkable appearance of the thoracic esophagus. Unremarkable appearance of the thoracic inlet and thyroid. Lungs/Pleura:  No pneumothorax or pleural effusion. Centrilobular emphysema with minimal paraseptal emphysema at the lung apices. No confluent airspace disease. No significant bronchial wall thickening. No pneumothorax. Musculoskeletal: No displaced fracture. Degenerative changes of the thoracic spine. Review of the MIP images confirms the above findings. CTA ABDOMEN AND PELVIS FINDINGS VASCULAR Aorta: Mixed calcified and soft plaque of the abdominal aorta. No aneurysm. No periaortic fluid. No dissection. Celiac: Typical branch pattern of the celiac artery, with left gastric, common hepatic, and splenic artery identified. SMA: No significant atherosclerotic changes of the superior mesenteric artery. Renals: Renal arteries are patent. Single left and single right renal artery. IMA: Inferior mesenteric artery is patent. Left colic artery patent. Superior rectal artery patent. Right lower extremity: Unremarkable course, caliber, and contour of the right iliac system. No aneurysm, dissection, or occlusion. Hypogastric artery is patent. Anterior and posterior division patent. Common femoral artery patent. Proximal SFA and profunda femoris patent. Left lower extremity: Unremarkable course, caliber, and contour of the left iliac system. No aneurysm, dissection, or occlusion. Hypogastric artery is patent. Anterior and posterior division patent. Common femoral artery patent. Proximal SFA and profunda femoris patent. Veins: Splenic vein appears attenuated posterior to the body of the pancreas, though not well evaluated secondary to the phase of the contrast. Portal vein not well evaluated by the phase of the contrast bolus. Review of the MIP images confirms the above findings. NON-VASCULAR Hepatobiliary: Unremarkable appearance of liver. The gallbladder demonstrates lumen full of non radiopaque stones. Pericholecystic fluid is present with questionable gallbladder wall thickening. No intrahepatic or extrahepatic biliary ductal dilatation.  Pancreas: No inflammatory changes at the head of the pancreas. Body the pancreas relatively unremarkable. Coarse calcification the tail of the pancreas. Focal fluid at the tail the pancreas  extending towards the inferior pole of the spleen and adjacent to the splenic flexure, within the splenic colic ligament. No pancreatic ductal dilation. Spleen: Fluid and inflammatory changes adjacent to the inferior tip of the spleen. Adrenals/Urinary Tract: Unremarkable appearance of adrenal glands. No evidence of right-sided or left-sided hydronephrosis. Likely nonobstructive nephrolithiasis bilateral kidneys. Course the bilateral ureters unremarkable. Unremarkable appearance of the urinary bladder. Stomach/Bowel: Inflammatory changes adjacent to the splenic flexure of the colon. Circumferential wall thickening of the distal transverse colon and sigmoid colon. Small fat focus on the lateral sigmoid colon (image 170 series 6). Extensive colonic diverticula of the sigmoid colon, descending colon, and the distal transverse colon. Unremarkable stomach. Unremarkable small bowel with no abnormal distention. Normal appendix. Lymphatic: Lymph nodes within the preaortic and para-aortic nodal station. Mesenteric: Inflammatory changes within the left upper quadrant adjacent to the spleen. Inflammatory changes of the fat adjacent the gallbladder extending into the hilum of the liver surrounding portal venous structures. No free fluid. Reproductive: Unremarkable appearance of the pelvic organs. Other: No hernia. Musculoskeletal: No acute fracture identified. Multilevel degenerative changes of the lower lumbar spine worst at the L2-L3 level and L5-S1 level. Bilateral facet disease worst in the lower lumbar spine. Degenerative changes bilateral hips. Review of the MIP images confirms the above findings. IMPRESSION: Study is negative for findings of acute aortic syndrome. Significant inflammatory changes within the left upper quadrant at the  tail the pancreas and splenic flexure of the colon. There is apparent circumferential colon wall thickening, and small volume of free fluid. Differential diagnosis includes distal pancreatitis, diverticulitis/epiploic appendagitis, focal ischemia, or potentially sequela of portal hypertension/sinistral portal hypertension. Considered much less likely would be occult colon cancer with perforation. Cholelithiasis with circumferential fluid/gallbladder wall thickening. If there is concern for acute cholecystitis, correlation with lab values and potentially HIDA scan may be considered. Inflammatory changes adjacent to the gallbladder extend towards the liver hilum surrounding portal venous structures, and there is small caliber splenic vein posterior to the pancreas. Portal vein not well evaluated given the absence of a delayed CT phase. If there is concern for portal vein abnormality/thrombus, repeat contrast-enhanced CT with 8 delayed portal venous phase may be considered. The above results were called by telephone at the time of interpretation on 10/01/2016 at 9:30 pm to Dr. Ovid Curd PAGE , who verbally acknowledged these results. Aortic atherosclerosis without aneurysm. Coarse calcification the tail of the pancreas, potentially representing chronic pancreatitis/prior pancreatitis. Centrilobular emphysema with minimal paraseptal emphysema. Coronary artery disease. Signed, Dulcy Fanny. Earleen Newport, DO [name] Electronically Signed   By: Corrie Mckusick D.O.   On: 10/01/2016 21:34    Subjective: Pt had long conversation with me this morning.  He was much more alert and oriented.  He is eating well.    Discharge Exam: Vitals:   10/27/16 0440 10/27/16 1007  BP: 116/78 107/72  Pulse: 66 69  Resp: 18 18  Temp: 98.6 F (37 C) 97.9 F (36.6 C)   Vitals:   10/26/16 1321 10/26/16 2040 10/27/16 0440 10/27/16 1007  BP: 120/65 123/86 116/78 107/72  Pulse: 70 (!)  125 66 69  Resp: 20 20 18 18   Temp: 99 F (37.2 C) 98.6 F (37 C) 98.6 F (37 C) 97.9 F (36.6 C)  TempSrc: Oral Oral Oral Oral  SpO2: 100% 100% 98% 100%  Weight:      Height:        General exam: awake, alert, confused at times and some  confabulation, NAD.  Cooperative Respiratory system:  No increased work of breathing. Cardiovascular system: S1 & S2 heard.   Gastrointestinal system: Abdomen is nondistended, soft and nontender. Normal bowel sounds heard. GU: foley in place.  Central nervous system: Alert and oriented to person. No focal neurological deficits. Extremities: no cyanosis.   The results of significant diagnostics from this hospitalization (including imaging, microbiology, ancillary and laboratory) are listed below for reference.     Microbiology: Recent Results (from the past 240 hour(s))  Culture, Urine     Status: Abnormal   Collection Time: 10/23/16  6:50 PM  Result Value Ref Range Status   Specimen Description URINE, CLEAN CATCH  Final   Special Requests NONE  Final   Culture (A)  Final    >=100,000 COLONIES/mL STAPHYLOCOCCUS SPECIES (COAGULASE NEGATIVE)   Report Status 10/26/2016 FINAL  Final   Organism ID, Bacteria STAPHYLOCOCCUS SPECIES (COAGULASE NEGATIVE) (A)  Final      Susceptibility   Staphylococcus species (coagulase negative) - MIC*    CIPROFLOXACIN <=0.5 SENSITIVE Sensitive     GENTAMICIN <=0.5 SENSITIVE Sensitive     NITROFURANTOIN <=16 SENSITIVE Sensitive     OXACILLIN <=0.25 SENSITIVE Sensitive     TETRACYCLINE <=1 SENSITIVE Sensitive     VANCOMYCIN 1 SENSITIVE Sensitive     TRIMETH/SULFA <=10 SENSITIVE Sensitive     CLINDAMYCIN <=0.25 SENSITIVE Sensitive     RIFAMPIN <=0.5 SENSITIVE Sensitive     Inducible Clindamycin NEGATIVE Sensitive     * >=100,000 COLONIES/mL STAPHYLOCOCCUS SPECIES (COAGULASE NEGATIVE)     Labs: BNP (last 3 results) No results for input(s): BNP in the last 8760 hours. Basic Metabolic Panel:  Recent  Labs Lab 10/23/16 1709 10/24/16 0614 10/25/16 0525 10/26/16 0531 10/27/16 0601  NA 138 140 139 138 139  K 3.9 4.3 3.9 3.6 3.5  CL 103 104 105 104 104  CO2 26 28 26 27 29   GLUCOSE 115* 114* 89 118* 131*  BUN 16 15 13 11 12   CREATININE 1.07 1.04 1.00 0.97 0.95  CALCIUM 9.9 9.6 9.0 9.0 9.1  MG  --  2.4  --   --   --    Liver Function Tests:  Recent Labs Lab 10/23/16 1709 10/25/16 0525 10/26/16 0531 10/27/16 0601  AST 27 21 21 18   ALT 28 24 21 20   ALKPHOS 74 59 59 59  BILITOT 0.7 0.9 0.7 0.5  PROT 7.7 6.5 6.9 6.3*  ALBUMIN 4.2 3.4* 3.5 3.4*   No results for input(s): LIPASE, AMYLASE in the last 168 hours. No results for input(s): AMMONIA in the last 168 hours. CBC:  Recent Labs Lab 10/23/16 1709 10/25/16 0525 10/26/16 0531  WBC 10.3 5.6 6.9  NEUTROABS 4.3  --   --   HGB 10.8* 10.1* 10.2*  HCT 34.8* 32.5* 33.2*  MCV 80.2 78.7 78.5  PLT 270 192 208   Cardiac Enzymes: No results for input(s): CKTOTAL, CKMB, CKMBINDEX, TROPONINI in the last 168 hours. BNP: Invalid input(s): POCBNP CBG:  Recent Labs Lab 10/20/16 1139 10/20/16 1556 10/25/16 0818 10/27/16 0742  GLUCAP 106* 147* 218* 91   D-Dimer No results for input(s): DDIMER in the last 72 hours. Hgb A1c No results for input(s): HGBA1C in the last 72 hours. Lipid Profile No results for input(s): CHOL, HDL, LDLCALC, TRIG, CHOLHDL, LDLDIRECT in the last 72 hours. Thyroid function studies No results for input(s): TSH, T4TOTAL, T3FREE, THYROIDAB in the last 72 hours.  Invalid input(s): FREET3 Anemia work up No results for  input(s): VITAMINB12, FOLATE, FERRITIN, TIBC, IRON, RETICCTPCT in the last 72 hours. Urinalysis    Component Value Date/Time   COLORURINE AMBER (A) 10/23/2016 1850   APPEARANCEUR TURBID (A) 10/23/2016 1850   LABSPEC 1.024 10/23/2016 1850   PHURINE 6.0 10/23/2016 1850   GLUCOSEU NEGATIVE 10/23/2016 1850   HGBUR LARGE (A) 10/23/2016 1850   BILIRUBINUR NEGATIVE 10/23/2016 1850    KETONESUR NEGATIVE 10/23/2016 1850   PROTEINUR 100 (A) 10/23/2016 1850   NITRITE POSITIVE (A) 10/23/2016 1850   LEUKOCYTESUR LARGE (A) 10/23/2016 1850   Sepsis Labs Invalid input(s): PROCALCITONIN,  WBC,  LACTICIDVEN Microbiology Recent Results (from the past 240 hour(s))  Culture, Urine     Status: Abnormal   Collection Time: 10/23/16  6:50 PM  Result Value Ref Range Status   Specimen Description URINE, CLEAN CATCH  Final   Special Requests NONE  Final   Culture (A)  Final    >=100,000 COLONIES/mL STAPHYLOCOCCUS SPECIES (COAGULASE NEGATIVE)   Report Status 10/26/2016 FINAL  Final   Organism ID, Bacteria STAPHYLOCOCCUS SPECIES (COAGULASE NEGATIVE) (A)  Final      Susceptibility   Staphylococcus species (coagulase negative) - MIC*    CIPROFLOXACIN <=0.5 SENSITIVE Sensitive     GENTAMICIN <=0.5 SENSITIVE Sensitive     NITROFURANTOIN <=16 SENSITIVE Sensitive     OXACILLIN <=0.25 SENSITIVE Sensitive     TETRACYCLINE <=1 SENSITIVE Sensitive     VANCOMYCIN 1 SENSITIVE Sensitive     TRIMETH/SULFA <=10 SENSITIVE Sensitive     CLINDAMYCIN <=0.25 SENSITIVE Sensitive     RIFAMPIN <=0.5 SENSITIVE Sensitive     Inducible Clindamycin NEGATIVE Sensitive     * >=100,000 COLONIES/mL STAPHYLOCOCCUS SPECIES (COAGULASE NEGATIVE)   Time coordinating discharge: 33 minutes  SIGNED:  Irwin Brakeman, MD  [facility] 10/27/2016, 11:34 AM Pager   If 7PM-7AM, please contact night-coverage [email or web address] Password [email or web address]

## 2016-10-27 NOTE — Discharge Instructions (Signed)
Please make appointment to follow up with urology office regarding urinary retention and foley catheter in 1 week.  He could likely be able to remove foley in 1 week and do another voiding trial.     Please make appointment to follow up with GI for liver care and for colonoscopy in 2 weeks and to follow up on pancreatic cyst Please make appointment to follow up with neurology for further testing regarding confusion  Please establish care with a primary care provider as soon as possible in 1 week.   Return or seek medical care if symptoms worsen or new problem develops.

## 2016-10-29 ENCOUNTER — Telehealth: Payer: Self-pay | Admitting: Physician Assistant

## 2016-10-29 NOTE — Telephone Encounter (Signed)
tricia patients daughter calling to speak to you regarding Barry Mccoy being in the hospital and changing his medications would like to speak to you regarding this please call her asap at [phone] because he is not sleeping  [phone] I also have scheduled this patient for hospital f/u for this coming up Monday

## 2016-10-30 MED ORDER — SERTRALINE HCL 50 MG PO TABS: 50.0000 mg | ORAL_TABLET | Freq: Every day | ORAL | 0 refills | Status: DC

## 2016-10-30 MED ORDER — TAMSULOSIN HCL 0.4 MG PO CAPS: 0.4000 mg | ORAL_CAPSULE | Freq: Every day | ORAL | 0 refills | Status: DC

## 2016-10-30 MED ORDER — PANTOPRAZOLE SODIUM 40 MG PO TBEC: 40.0000 mg | DELAYED_RELEASE_TABLET | Freq: Every day | ORAL | Status: DC

## 2016-10-30 MED ORDER — QUETIAPINE FUMARATE 50 MG PO TABS: 50.0000 mg | ORAL_TABLET | Freq: Every day | ORAL | 0 refills | Status: DC

## 2016-10-30 MED ORDER — METOPROLOL TARTRATE 25 MG PO TABS: 25.0000 mg | ORAL_TABLET | Freq: Two times a day (BID) | ORAL | 0 refills | Status: DC

## 2016-10-30 MED ORDER — BACLOFEN 20 MG PO TABS: ORAL_TABLET | ORAL | 1 refills | Status: DC

## 2016-10-30 NOTE — Telephone Encounter (Signed)
Called Tricia bck [email or web address]

## 2016-10-30 NOTE — Telephone Encounter (Signed)
Spoke with pt daughter [name] and she stated her father needed Seroquel, baclofen, metroprolol tartrate lopressor, sertraline, tamsulosin and pantroprazole filled.  [name] states her sister is with the father and these are the medications that her sister stated he did not have.  Explained I would refill for 30 days to hold Barry Mccoy until his appt with [name] 2-26 [name] states she  is fine with this. RX will be sent to [facility]

## 2016-10-31 DIAGNOSIS — R338 Other retention of urine: Secondary | ICD-10-CM | POA: Diagnosis not present

## 2016-10-31 DIAGNOSIS — B958 Unspecified staphylococcus as the cause of diseases classified elsewhere: Secondary | ICD-10-CM | POA: Diagnosis not present

## 2016-10-31 DIAGNOSIS — N401 Enlarged prostate with lower urinary tract symptoms: Secondary | ICD-10-CM | POA: Diagnosis not present

## 2016-10-31 DIAGNOSIS — D649 Anemia, unspecified: Secondary | ICD-10-CM | POA: Diagnosis not present

## 2016-10-31 DIAGNOSIS — G9340 Encephalopathy, unspecified: Secondary | ICD-10-CM | POA: Diagnosis not present

## 2016-10-31 DIAGNOSIS — N3000 Acute cystitis without hematuria: Secondary | ICD-10-CM | POA: Diagnosis not present

## 2016-11-01 ENCOUNTER — Telehealth: Payer: Self-pay

## 2016-11-01 NOTE — Telephone Encounter (Signed)
Tiffany with [facility] was calling to get orders to go out to [facility] twice a week for 8 weeks for home health care.  Verbal approved

## 2016-11-05 ENCOUNTER — Encounter: Payer: Self-pay | Admitting: Physician Assistant

## 2016-11-05 ENCOUNTER — Ambulatory Visit (INDEPENDENT_AMBULATORY_CARE_PROVIDER_SITE_OTHER): Payer: Medicare Other | Admitting: Physician Assistant

## 2016-11-05 ENCOUNTER — Encounter (HOSPITAL_COMMUNITY): Payer: Self-pay

## 2016-11-05 ENCOUNTER — Inpatient Hospital Stay (HOSPITAL_COMMUNITY)
Admission: EM | Admit: 2016-11-05 | Discharge: 2016-11-10 | DRG: 378 | Disposition: A | Discharge status: Home / Self Care | Payer: Medicare Other | Attending: Internal Medicine | Admitting: Internal Medicine

## 2016-11-05 ENCOUNTER — Emergency Department (HOSPITAL_COMMUNITY): Payer: Medicare Other

## 2016-11-05 VITALS — BP 118/62 | HR 80 | Temp 98.0°F | Resp 18 | Wt 213.8 lb

## 2016-11-05 DIAGNOSIS — Z800 Family history of malignant neoplasm of digestive organs: Secondary | ICD-10-CM

## 2016-11-05 DIAGNOSIS — K640 First degree hemorrhoids: Secondary | ICD-10-CM | POA: Diagnosis present

## 2016-11-05 DIAGNOSIS — N182 Chronic kidney disease, stage 2 (mild): Secondary | ICD-10-CM | POA: Diagnosis not present

## 2016-11-05 DIAGNOSIS — R112 Nausea with vomiting, unspecified: Secondary | ICD-10-CM | POA: Diagnosis present

## 2016-11-05 DIAGNOSIS — Z09 Encounter for follow-up examination after completed treatment for conditions other than malignant neoplasm: Secondary | ICD-10-CM | POA: Diagnosis not present

## 2016-11-05 DIAGNOSIS — Z79899 Other long term (current) drug therapy: Secondary | ICD-10-CM

## 2016-11-05 DIAGNOSIS — K5730 Diverticulosis of large intestine without perforation or abscess without bleeding: Secondary | ICD-10-CM | POA: Diagnosis present

## 2016-11-05 DIAGNOSIS — K922 Gastrointestinal hemorrhage, unspecified: Secondary | ICD-10-CM | POA: Diagnosis not present

## 2016-11-05 DIAGNOSIS — F329 Major depressive disorder, single episode, unspecified: Secondary | ICD-10-CM | POA: Diagnosis present

## 2016-11-05 DIAGNOSIS — N400 Enlarged prostate without lower urinary tract symptoms: Secondary | ICD-10-CM | POA: Diagnosis present

## 2016-11-05 DIAGNOSIS — Z7982 Long term (current) use of aspirin: Secondary | ICD-10-CM

## 2016-11-05 DIAGNOSIS — I129 Hypertensive chronic kidney disease with stage 1 through stage 4 chronic kidney disease, or unspecified chronic kidney disease: Secondary | ICD-10-CM | POA: Diagnosis present

## 2016-11-05 DIAGNOSIS — K7030 Alcoholic cirrhosis of liver without ascites: Secondary | ICD-10-CM

## 2016-11-05 DIAGNOSIS — I639 Cerebral infarction, unspecified: Secondary | ICD-10-CM | POA: Diagnosis not present

## 2016-11-05 DIAGNOSIS — F1010 Alcohol abuse, uncomplicated: Secondary | ICD-10-CM | POA: Clinically undetermined

## 2016-11-05 DIAGNOSIS — Z87891 Personal history of nicotine dependence: Secondary | ICD-10-CM

## 2016-11-05 DIAGNOSIS — R319 Hematuria, unspecified: Secondary | ICD-10-CM

## 2016-11-05 DIAGNOSIS — D62 Acute posthemorrhagic anemia: Secondary | ICD-10-CM | POA: Diagnosis present

## 2016-11-05 DIAGNOSIS — N390 Urinary tract infection, site not specified: Secondary | ICD-10-CM

## 2016-11-05 DIAGNOSIS — G9340 Encephalopathy, unspecified: Secondary | ICD-10-CM | POA: Diagnosis not present

## 2016-11-05 DIAGNOSIS — K921 Melena: Secondary | ICD-10-CM | POA: Diagnosis not present

## 2016-11-05 DIAGNOSIS — D649 Anemia, unspecified: Secondary | ICD-10-CM | POA: Diagnosis not present

## 2016-11-05 DIAGNOSIS — R531 Weakness: Secondary | ICD-10-CM | POA: Diagnosis not present

## 2016-11-05 LAB — CBC WITH DIFFERENTIAL/PLATELET
BASOS ABS: 52 {cells}/uL (ref 0–200)
Basophils Relative: 1 %
Eosinophils Absolute: 208 cells/uL (ref 15–500)
Eosinophils Relative: 4 %
HEMATOCRIT: 25.4 % — AB (ref 38.5–50.0)
HEMOGLOBIN: 7.6 g/dL — AB (ref 13.0–17.0)
LYMPHS ABS: 2184 {cells}/uL (ref 850–3900)
Lymphocytes Relative: 42 %
MCH: 24.6 pg — ABNORMAL LOW (ref 27.0–33.0)
MCHC: 29.9 g/dL — AB (ref 32.0–36.0)
MCV: 82.2 fL (ref 80.0–100.0)
MONO ABS: 364 {cells}/uL (ref 200–950)
MPV: 8.7 fL (ref 7.5–12.5)
Monocytes Relative: 7 %
NEUTROS PCT: 46 %
Neutro Abs: 2392 cells/uL (ref 1500–7800)
Platelets: 177 10*3/uL (ref 140–400)
RBC: 3.09 MIL/uL — AB (ref 4.20–5.80)
RDW: 19.2 % — ABNORMAL HIGH (ref 11.0–15.0)
WBC: 5.2 10*3/uL (ref 3.8–10.8)

## 2016-11-05 LAB — URINALYSIS, ROUTINE W REFLEX MICROSCOPIC
BILIRUBIN URINE: NEGATIVE
BILIRUBIN URINE: NEGATIVE
GLUCOSE, UA: NEGATIVE
Glucose, UA: NEGATIVE mg/dL
Hgb urine dipstick: NEGATIVE
KETONES UR: NEGATIVE mg/dL
Ketones, ur: NEGATIVE
Leukocytes, UA: NEGATIVE
Nitrite: NEGATIVE
Nitrite: NEGATIVE
PH: 6 (ref 5.0–8.0)
PROTEIN: NEGATIVE mg/dL
Protein, ur: NEGATIVE
SPECIFIC GRAVITY, URINE: 1.025 (ref 1.001–1.035)
SQUAMOUS EPITHELIAL / LPF: NONE SEEN
Specific Gravity, Urine: 1.011 (ref 1.005–1.030)
pH: 5.5 (ref 5.0–8.0)

## 2016-11-05 LAB — COMPLETE METABOLIC PANEL WITH GFR
ALBUMIN: 3.6 g/dL (ref 3.6–5.1)
ALK PHOS: 60 U/L (ref 40–115)
ALT: 14 U/L (ref 9–46)
AST: 14 U/L (ref 10–35)
BILIRUBIN TOTAL: 0.3 mg/dL (ref 0.2–1.2)
BUN: 8 mg/dL (ref 7–25)
CALCIUM: 9.1 mg/dL (ref 8.6–10.3)
CO2: 28 mmol/L (ref 20–31)
CREATININE: 1.06 mg/dL (ref 0.70–1.25)
Chloride: 105 mmol/L (ref 98–110)
GFR, Est African American: 84 mL/min (ref >=60)
GFR, Est Non African American: 72 mL/min (ref >=60)
Glucose, Bld: 137 mg/dL — ABNORMAL HIGH (ref 70–99)
POTASSIUM: 4.2 mmol/L (ref 3.5–5.3)
Sodium: 141 mmol/L (ref 135–146)
TOTAL PROTEIN: 5.8 g/dL — AB (ref 6.1–8.1)

## 2016-11-05 LAB — CBG MONITORING, ED: GLUCOSE-CAPILLARY: 89 mg/dL (ref 65–99)

## 2016-11-05 LAB — BASIC METABOLIC PANEL
Anion gap: 6 (ref 5–15)
BUN: 10 mg/dL (ref 6–20)
CHLORIDE: 103 mmol/L (ref 101–111)
CO2: 26 mmol/L (ref 22–32)
Calcium: 9.1 mg/dL (ref 8.9–10.3)
Creatinine, Ser: 1.1 mg/dL (ref 0.61–1.24)
GFR calc non Af Amer: >60 mL/min (ref >60)
Glucose, Bld: 93 mg/dL (ref 65–99)
POTASSIUM: 4 mmol/L (ref 3.5–5.1)
SODIUM: 135 mmol/L (ref 135–145)

## 2016-11-05 LAB — CBC
HEMATOCRIT: 22.6 % — AB (ref 39.0–52.0)
HEMOGLOBIN: 7.1 g/dL — AB (ref 13.0–17.0)
MCH: 24.9 pg — ABNORMAL LOW (ref 26.0–34.0)
MCHC: 31.4 g/dL (ref 30.0–36.0)
MCV: 79.3 fL (ref 78.0–100.0)
Platelets: 188 10*3/uL (ref 150–400)
RBC: 2.85 MIL/uL — AB (ref 4.22–5.81)
RDW: 19 % — ABNORMAL HIGH (ref 11.5–15.5)
WBC: 6.4 10*3/uL (ref 4.0–10.5)

## 2016-11-05 LAB — PROTIME-INR
INR: 0.95
Prothrombin Time: 12.6 seconds (ref 11.4–15.2)

## 2016-11-05 LAB — POC OCCULT BLOOD, ED: Fecal Occult Bld: POSITIVE — AB

## 2016-11-05 LAB — MAGNESIUM: MAGNESIUM: 2 mg/dL (ref 1.5–2.5)

## 2016-11-05 MED ORDER — SODIUM CHLORIDE 0.9 % IV SOLN
INTRAVENOUS | Status: DC
  Administered 2016-11-05 – 2016-11-06 (×2): via INTRAVENOUS

## 2016-11-05 MED ORDER — SODIUM CHLORIDE 0.9 % IV BOLUS (SEPSIS)
1000.0000 mL | Freq: Once | INTRAVENOUS | Status: AC
  Administered 2016-11-05: 1000 mL via INTRAVENOUS

## 2016-11-05 MED ORDER — PANTOPRAZOLE SODIUM 40 MG IV SOLR
40.0000 mg | Freq: Once | INTRAVENOUS | Status: AC
  Administered 2016-11-05: 40 mg via INTRAVENOUS
  Filled 2016-11-05: qty 40

## 2016-11-05 NOTE — ED Triage Notes (Signed)
Pt was in the hospital recently and went to office today for a follow up and his hemoglobin was low

## 2016-11-05 NOTE — ED Provider Notes (Signed)
[place] DEPT Provider Note   CSN: [id number] Arrival date & time: 11/05/16  2203     History   Chief Complaint Chief Complaint  Patient presents with  . abnormal labs    HPI Barry Mccoy is a 66 y.o. male.  Pt presents to the ED with low hemoglobin.  The pt was admitted from 1/22-2/13 with syncope from a GI bleed with a hemoglobin that got down to 4.1.  He did see Dr. Benson Norway (GI) while in the hospital.  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  Pt placed on protonix.   The pt has an appt to see him again this week for a repeat EGD.  The pt does have a hx of Etoh abuse and his admission was complicated by etoh withdrawal.  The pt was readmitted from 2/13-217 with encephalopathy.  He has been living with his daughter since discharge and has been taking all meds as directed.  He went to his pcp for a post hospitalization recheck.  The pcp ordered blood which came back low.  They were directed to come back to the hospital.  Pt said he's had black stools since discharge.      Past Medical History:  Diagnosis Date  . Anxiety   . BPH (benign prostatic hyperplasia)   . Cervical spine disease   . Chronic kidney disease    kidney stones  . Depression   . Disorder of lumbar spine   . ETOH abuse   . GI bleed   . Muscle spasm     Patient Active Problem List   Diagnosis Date Noted  . UTI (urinary tract infection) 10/23/2016  . Urinary retention 10/23/2016  . Altered mental status   . Acute encephalopathy   . Positive D dimer   . Severe single current episode of major depressive disorder, with psychotic features ([place])   . Delirium   . Acute respiratory failure ([place])   . Normocytic anemia 10/01/2016  . Acute GI bleeding 10/01/2016  . Grief reaction 04/19/2016  . Depression 04/19/2016  . BPH (benign prostatic hyperplasia) 04/19/2016  . Testosterone deficiency 10/26/2015  . Vitamin D deficiency 10/21/2015  . Smoker 10/17/2015  . Chronic pain syndrome 10/17/2015  .  Anxiety   . Cervical spine disease   . Disorder of lumbar spine     Past Surgical History:  Procedure Laterality Date  . ELBOW SURGERY     as child  . ELBOW SURGERY    . ESOPHAGOGASTRODUODENOSCOPY N/A 10/04/2016   Procedure: ESOPHAGOGASTRODUODENOSCOPY (EGD);  Surgeon: Carol Ada, MD;  Location: [facility];  Service: Gastroenterology;  Laterality: N/A;       Home Medications    Prior to Admission medications   Medication Sig Start Date End Date Taking? Authorizing Provider  acetaminophen (TYLENOL) 325 MG tablet Take 2 tablets (650 mg total) by mouth every 6 (six) hours as needed for mild pain, moderate pain or headache (or Fever >/= 101). 10/27/16  Yes Clanford Marisa Hua, MD  aspirin EC 81 MG tablet Take 81 mg by mouth every morning.    Yes Michel Santee, MD  baclofen (LIORESAL) 20 MG tablet TAKE 1 TABLET BY MOUTH EVERY 12 HOURS AS NEEDED FOR SPASMS 10/30/16  Yes [name], PA-C  Cholecalciferol (VITAMIN D-3) 5000 units TABS Take 5,000 Units by mouth at bedtime.   Yes Historical Provider, MD  folic acid (FOLVITE) 1 MG tablet Take 1 tablet (1 mg total) by mouth daily. Patient taking differently: Take 1 mg  by mouth every morning.  10/28/16  Yes Clanford Marisa Hua, MD  lidocaine (LIDODERM) 5 % Place 1 patch onto the skin every 12 (twelve) hours as needed (pain).  09/12/16  Yes Historical Provider, MD  metoprolol tartrate (LOPRESSOR) 25 MG tablet Take 1 tablet (25 mg total) by mouth 2 (two) times daily. 10/30/16  Yes [name], PA-C  Multiple Vitamin (MULTIVITAMIN WITH MINERALS) TABS tablet Take 1 tablet by mouth at bedtime.   Yes Historical Provider, MD  nicotine (NICODERM CQ - DOSED IN MG/24 HOURS) 14 mg/24hr patch Place 1 patch (14 mg total) onto the skin daily. 10/20/16  Yes Domenic Polite, MD  OnabotulinumtoxinA (BOTOX IJ) Inject as directed See admin instructions. Botox injections done at Dr. Audery Amel office approximately every 90 days   Yes Historical Provider, MD  polyethylene  glycol (MIRALAX / GLYCOLAX) packet Take 17 g by mouth daily as needed for mild constipation. 10/27/16  Yes Clanford Marisa Hua, MD  QUEtiapine (SEROQUEL) 50 MG tablet Take 1 tablet (50 mg total) by mouth at bedtime. 10/30/16  Yes Mary B Dixon, PA-C  sulfamethoxazole-trimethoprim (BACTRIM DS,SEPTRA DS) 800-160 MG tablet Take 1 tablet by mouth every 12 (twelve) hours.   Yes Historical Provider, MD  tamsulosin (FLOMAX) 0.4 MG CAPS capsule Take 1 capsule (0.4 mg total) by mouth at bedtime. 10/30/16  Yes [name], PA-C  thiamine 100 MG tablet Take 1 tablet (100 mg total) by mouth daily. For 4days then down to 100mg  daily Patient taking differently: Take 250 mg by mouth daily. For 4days then down to 100mg  daily 10/27/16  Yes Clanford Marisa Hua, MD  traZODone (DESYREL) 100 MG tablet Take 1 tablet (100 mg total) by mouth at bedtime. 10/27/16  Yes Clanford Marisa Hua, MD  pantoprazole (PROTONIX) 40 MG tablet Take 1 tablet (40 mg total) by mouth daily. Patient not taking: Reported on 11/06/2016 10/30/16   [name], PA-C  sertraline (ZOLOFT) 50 MG tablet Take 1 tablet (50 mg total) by mouth at bedtime. Patient not taking: Reported on 11/05/2016 10/30/16   [name], PA-C    Family History Family History  Problem Relation Age of Onset  . Colon cancer Brother   . Alcohol abuse Brother   . Cancer Brother   . Early death Brother   . Prostate cancer Brother   . Colon polyps Brother   . Cancer Mother 51    colon cancer  . Colon cancer Mother     Social History Social History  Substance Use Topics  . Smoking status: Former Smoker    Packs/day: 1.00    Years: 50.00    Types: Cigarettes    Quit date: 10/23/2016  . Smokeless tobacco: Never Used     Comment: attempting to quit- down to 8 a day   . Alcohol use No     Comment: 2 drinks a month     Allergies   Shellfish allergy   Review of Systems Review of Systems  Gastrointestinal:       Black stool  All other systems reviewed and are  negative.    Physical Exam Updated Vital Signs BP 102/61   Pulse 63   Temp 98 F (36.7 C) (Oral)   Resp 20   SpO2 98%   Physical Exam  Constitutional: He is oriented to person, place, and time. He appears well-developed and well-nourished.  HENT:  Head: Normocephalic and atraumatic.  Right Ear: External ear normal.  Left Ear: External ear normal.  Nose: Nose normal.  Mouth/Throat: Oropharynx is clear and moist.  Eyes: Conjunctivae and EOM are normal. Pupils are equal, round, and reactive to light.  Neck: Normal range of motion. Neck supple.  Cardiovascular: Normal rate, regular rhythm, normal heart sounds and intact distal pulses.   Pulmonary/Chest: Effort normal and breath sounds normal.  Abdominal: Soft. Bowel sounds are normal.  Genitourinary: Rectal exam shows guaiac positive stool.  Musculoskeletal: Normal range of motion.  Neurological: He is alert and oriented to person, place, and time.  Skin: Skin is warm. There is pallor.  Psychiatric: He has a normal mood and affect. His behavior is normal. Judgment and thought content normal.  Nursing note and vitals reviewed.    ED Treatments / Results  Labs (all labs ordered are listed, but only abnormal results are displayed) Labs Reviewed  CBC - Abnormal; Notable for the following:       Result Value   RBC 2.85 (*)    Hemoglobin 7.1 (*)    HCT 22.6 (*)    MCH 24.9 (*)    RDW 19.0 (*)    All other components within normal limits  URINALYSIS, ROUTINE W REFLEX MICROSCOPIC - Abnormal; Notable for the following:    Hgb urine dipstick SMALL (*)    Leukocytes, UA TRACE (*)    Bacteria, UA RARE (*)    All other components within normal limits  POC OCCULT BLOOD, ED - Abnormal; Notable for the following:    Fecal Occult Bld POSITIVE (*)    All other components within normal limits  BASIC METABOLIC PANEL  AMMONIA  PROTIME-INR  CBG MONITORING, ED  PREPARE RBC (CROSSMATCH)    EKG  EKG Interpretation None        Radiology Dg Chest Portable 1 View  Result Date: 11/05/2016 CLINICAL DATA:  Acute onset of decreased hemoglobin. GI bleeding. Generalized weakness. Initial encounter. EXAM: PORTABLE CHEST 1 VIEW COMPARISON:  CT of the thoracic spine performed 07/21/2012 FINDINGS: The lungs are well-aerated. Vascular congestion is noted. Peribronchial thickening is seen. There is no evidence of pleural effusion or pneumothorax. The cardiomediastinal silhouette is within normal limits. No acute osseous abnormalities are seen. IMPRESSION: Vascular congestion.  Peribronchial thickening seen. Electronically Signed   By: Garald Balding M.D.   On: 11/05/2016 23:45    Procedures Procedures (including critical care time)  Medications Ordered in ED Medications  sodium chloride 0.9 % bolus 1,000 mL (1,000 mLs Intravenous New Bag/Given 11/05/16 2351)    And  0.9 %  sodium chloride infusion ( Intravenous New Bag/Given 11/05/16 2356)  nicotine (NICODERM CQ - dosed in mg/24 hours) patch 21 mg (21 mg Transdermal Patch Applied 11/06/16 0021)  0.9 %  sodium chloride infusion (not administered)  pantoprazole (PROTONIX) 80 mg in sodium chloride 0.9 % 250 mL (0.32 mg/mL) infusion (not administered)  pantoprazole (PROTONIX) injection 40 mg (40 mg Intravenous Given 11/05/16 2352)     Initial Impression / Assessment and Plan / ED Course  I have reviewed the triage vital signs and the nursing notes.  Pertinent labs & imaging results that were available during my care of the patient were reviewed by me and considered in my medical decision making (see chart for details).    Pt's hgb has dropped from 10.2 11 days ago to 7.1 now.  2 units prbcs ordered.  Protonix drip ordered.  Pt d/w Dr. Tamala Julian (hospitalists) for admission.  Final Clinical Impressions(s) / ED Diagnoses   Final diagnoses:  Upper GI bleed  Alcoholic  cirrhosis of liver without ascites (HCC)  Symptomatic anemia    New Prescriptions New Prescriptions   No  medications on file     Isla Pence, MD 11/06/16 [phone]

## 2016-11-05 NOTE — Progress Notes (Addendum)
Patient ID: Barry Mccoy MRN: [id number], DOB: 11-19-1950, 66 y.o. Date of Encounter: @DATE @  Chief Complaint:  Chief Complaint  Patient presents with  . Hospitalization Follow-up  .       Marland Kitchen     HPI: 66 y.o. year old male  presents for Hospital Discharge Follow Up.    THE FOLLOWING IS COPIED FROM HIS DISCHARGE SUMMARY AND I REVIEWED THE FOLLOWING INFORMATION TODAY:  Admit date: 10/23/2016 Discharge date: 10/27/2016  Admitted From: Home  Disposition:  Home with 24/7 family supervision and [facility] services RN, SW, Aide  Recommendations for Outpatient Follow-up:  1. Follow up with PCP in 1 weeks 2. Please follow up with urology in 1 week to consider void trial 3. Please follow up with GI for outpatient colonoscopy in 2 weeks and evaluate pancreatic cysts 4. Please follow up with neurologist in 2-3 weeks for neurocognitive testing and evaluation 5. Please consider MRI abdomen in 1-2 months to evaluate pancreatic cyst noted on CT  Discharge Condition: STABLE  CODE STATUS: FULL   Brief/Interim Summary: Chief Complaint: Confusion, agitation  HPI: Barry Mccoy a 66 y.o.malewith medical history significant for anxiety, depression, and alcohol abuse, presenting to the emergency department from his SNF for evaluation of acute confusion and agitation. He had just been discharged from the hospital earlier today, but left the SNF, walking out into traffic, apparently confused and agitated. IVC was placed and the patient was brought into the ED by the PD. Patient had beenadmitted to the hospital on 10/01/2016 with melena, dyspnea, hypotension, and syncope. He was transfused 5 units of packed red blood cells during that admission which was complicated by delirium and agitation requiring high doses of benzodiazepines which precipitated respiratory failure, necessitating intubation. He was extubated on 10/07/2016. Patient continued to be delirious and agitated, requiring 4 point restraints and  pharmacologic restraint. Neurology consultation was obtained during the hospitalization and the patient was started on high-dose thiamine which has since been tapered down. Outpatient neurocognitive testing was planned. He was also evaluated by gastroenterology with EGD notable for portal gastropathy and he was placed on Protonix daily. He was ultimately discharged to SNF with plans to continue weaning Seroquel and Haldol. Admission was also complicated by urinary retention. Voiding trial was unsuccessful prior to discharge and Foley was replaced. Patient has been kept on Flomax with outpatient urology follow-up.   ED Course:Upon arrival to the ED, patient is found to be afebrile, saturating well on room air, and with vital signs otherwise stable. EKG features sinus rhythm with PAC and QTcof 503 ms. Chemistry panel is essentially normal and CBC is notable for a hemoglobin of 10.8, up from 9.6 five days ago. Urinalysis features many bacteria, TNTC white cells, large leukocytes, and positive nitrites. Large hemoglobin is also noted on dipstick. Patient was treated with an empiric dose of Rocephin in the emergency department, remained hemodynamically stable and in no apparent respiratory distress. He will be admitted to the telemetry unit for ongoing evaluation and management of acute encephalopathy, possibly secondary to UTI.  1. Acute encephalopathy - resolved now, likely was secondary to UTI - Pt became confused at his SNF and walked out into traffic, leading to IVC being placed, now has been rescinded - There are no focal neurologic deficits, CT head no acute findings, cerebral atrophy noted - Folate, B12, and TSH wnl during recent admission; he was treated with high-dose thiamine; HIV and RPR negative - There is UTI on presentation; It is being treated and  still having confusion, CT head without contrast No acute findings.  - Requested psychiatry consultation 2/14: See notes, also requested  neurology consult 2/16.  - Pt much improved since 2/15 with safety restraints removed and not requiring PRN sedatives - Neurology was consulted to see him 2/16 and they felt that symptoms related to infection in addition to underlying neurocognitive decline (see notes), MRI brain ordered: no acute findings, Stable moderate chronic microvascular ischemic changes and parenchymal volume loss of the brain.  Pt was advised to have outpatient neurology follow up for neurocognitive testing.  PT evaluated patient and advised no PT follow up.    2. UTI Staph species - UA consistent with UTI  - Empiric Rocephin given IV  - Urine sent for culture; prior culture grew multiple species, none isolated - Started oral Bactrim DS 2/16 based on C&S results, continue 7 more days of Bactrim DS  3. Normocytic anemia  - Hgb 10.8 on admission, up from 9.6 five days earlier  - Likely secondary to GI blood-loss as presented last month with melena and required 5 units of pRBCs; EGD was revealing of portal gastropathy  - No evidence for ongoing blood-loss - Continue daily Protonix   4. Urinary retention  - Failed voiding trial prior to recent discharge  - Continue Flomax, continue catheter care, follow up with urology outpatient after discharge, consider voiding trial in 1 week.   -  I had a discussion with patient about doing a voiding trial before discharge, he was concerned that he would fail the trial and have to have the foley replaced like he did last week so he opted to keep the foley in place and follow up with urology in 1 week for a void trial, he will continue taking the flomax.    5. Portal gastropathy, probable cirrhosis  - Gastropathy noted on EGD during recent admission and CT findings suggestive of cirrhosis  - There is history of alcohol use per history - Continue daily Protonix - Follow up with GI in 2 weeks for consideration of colonoscopy and cirrhosis care.   - Pt should have MRI abdomen in  1-2 months to follow up on pancreatic cyst seen on CT scan.    6. Prolonged QTc  - QTc 503 ms on admission - Pt is on numerous offending agents, will attempt to minimize - Replace potassium to 4 and magnesium to 2 - He had been monitored on telemetry but has been stable, DC'd tele 2/16     TODAY---11/05/2016-----PT IS IN ROOM ALONE, BY HIMSELF.  THE FOLLOWING INFORMATION IS FROM TODAY'S VISIT: I asked if he is here alone. He says that his daughter is out in the vehicle in the parking lot.\ He says he is currently staying at his daughter's house. HomeHealthNurse comes there. I noted that he has no Foley catheter in place currently. I asked if he pulled that out /removed that himself. He says that "the doctor removed" the Foley. He cannot tell me which doctor. I asked if he has already had his f/u OV with Urology and he does not give me a definitve answer. Tells me that he has another appointment scheduled for tomorrow but isn't sure which doctor that is scheduled with. He has no specific complaints or concerns today.     Past Medical History:  Diagnosis Date  . Anxiety   . BPH (benign prostatic hyperplasia)   . Cervical spine disease   . Chronic kidney disease    kidney stones  .  Depression   . Disorder of lumbar spine   . ETOH abuse   . GI bleed   . Muscle spasm      Home Meds: Outpatient Medications Prior to Visit  Medication Sig Dispense Refill  . acetaminophen (TYLENOL) 325 MG tablet Take 2 tablets (650 mg total) by mouth every 6 (six) hours as needed for mild pain, moderate pain or headache (or Fever >/= 101).    Marland Kitchen aspirin EC 81 MG tablet Take 81 mg by mouth daily.    . baclofen (LIORESAL) 20 MG tablet Take 1 tablet (20 mg total) by mouth at bedtime.  0  . baclofen (LIORESAL) 20 MG tablet TAKE 1 TABLET BY MOUTH EVERY 12 HOURS AS NEEDED FOR SPASMS 30 each 1  . Cholecalciferol (VITAMIN D-3) 5000 units TABS Take 5,000 Units by mouth at bedtime.    . folic acid (FOLVITE)  1 MG tablet Take 1 tablet (1 mg total) by mouth daily. 30 tablet 0  . metoprolol tartrate (LOPRESSOR) 25 MG tablet Take 1 tablet (25 mg total) by mouth 2 (two) times daily. 60 tablet 0  . Multiple Vitamin (MULTIVITAMIN WITH MINERALS) TABS tablet Take 1 tablet by mouth at bedtime.    . nicotine (NICODERM CQ - DOSED IN MG/24 HOURS) 14 mg/24hr patch Place 1 patch (14 mg total) onto the skin daily. 28 patch 0  . OnabotulinumtoxinA (BOTOX IJ) Inject as directed See admin instructions. Botox injections done at Dr. Audery Amel office approximately every 90 days    . pantoprazole (PROTONIX) 40 MG tablet Take 1 tablet (40 mg total) by mouth daily.    . polyethylene glycol (MIRALAX / GLYCOLAX) packet Take 17 g by mouth daily as needed for mild constipation. 14 each 0  . QUEtiapine (SEROQUEL) 50 MG tablet Take 1 tablet (50 mg total) by mouth at bedtime. 30 tablet 0  . tamsulosin (FLOMAX) 0.4 MG CAPS capsule Take 1 capsule (0.4 mg total) by mouth at bedtime. 30 capsule 0  . thiamine 100 MG tablet Take 1 tablet (100 mg total) by mouth daily. For 4days then down to 100mg  daily    . traZODone (DESYREL) 100 MG tablet Take 1 tablet (100 mg total) by mouth at bedtime.    . sertraline (ZOLOFT) 50 MG tablet Take 1 tablet (50 mg total) by mouth at bedtime. (Patient not taking: Reported on 11/05/2016) 30 tablet 0   No facility-administered medications prior to visit.     Allergies:  Allergies  Allergen Reactions  . Shellfish Allergy Other (See Comments)    Unknown     Social History   Social History  . Marital status: Widowed    Spouse name: N/A  . Number of children: N/A  . Years of education: N/A   Occupational History  . Not on file.   Social History Main Topics  . Smoking status: Former Smoker    Packs/day: 1.00    Years: 50.00    Types: Cigarettes    Quit date: 10/23/2016  . Smokeless tobacco: Never Used     Comment: attempting to quit- down to 8 a day   . Alcohol use No     Comment: 2 drinks a  month  . Drug use: No  . Sexual activity: Yes   Other Topics Concern  . Not on file   Social History Narrative   ** Merged History Encounter **        Family History  Problem Relation Age of Onset  . Colon cancer Brother   .  Alcohol abuse Brother   . Cancer Brother   . Early death Brother   . Prostate cancer Brother   . Colon polyps Brother   . Cancer Mother 49    colon cancer  . Colon cancer Mother      Review of Systems:  See HPI for pertinent ROS. All other ROS negative.    Physical Exam: Blood pressure 118/62, pulse 80, temperature 98 F (36.7 C), temperature source Oral, resp. rate 18, weight 213 lb 12.8 oz (97 kg), SpO2 98 %., Body mass index is 27.45 kg/m. General: WM. Appears in no acute distress. Neck: Supple. No thyromegaly. No lymphadenopathy. Lungs: Clear bilaterally to auscultation without wheezes, rales, or rhonchi. Breathing is unlabored. Heart: RRR with S1 S2. No murmurs, rubs, or gallops. Abdomen: Soft, non-tender, non-distended with normoactive bowel sounds. No hepatomegaly. No rebound/guarding. No obvious abdominal masses. Musculoskeletal:  Strength and tone normal for age. Extremities/Skin: Warm and dry.  Neuro: Alert .Moves all extremities spontaneously. Gait is normal. CNII-XII grossly in tact. Psych:  Responds to questions. Abnormal affect.     ASSESSMENT AND PLAN:  66 y.o. year old male with  1. Hospital discharge follow-up Will check UA to make sure UTI resolved and will check f/u labs to make sure these are stable.  He is to follow-up with urology and GI and neurology. Also will need MRI abdomen in 1-2 months to evaluate pancreatic cyst noted on CT. GI may f/u this.  - Urinalysis, Routine w reflex microscopic - CBC with Differential/Platelet - COMPLETE METABOLIC PANEL WITH GFR - Urine culture - Magnesium  2. Acute encephalopathy - Urinalysis, Routine w reflex microscopic - Urine culture  3. Acute GI bleeding - CBC with  Differential/Platelet  4. Urinary tract infection with hematuria, site unspecified - Urinalysis, Routine w reflex microscopic - Urine culture   Signed, [facility] [place], [place], [facility] 11/05/2016 12:44 PM  Addendum (11/05/2016) Received call from [facility] regarding critically low hemoglobin of 7.6.  This is a precipitous drop from previous hgb in hospital.  Given that and his PMH and his new onset abdominal pain, I suspect Upper GI bleed and need for immediate ER evaluation.  Tried to call home phone and no answer at 8:55 PM.  I left voice message.  Tried to call mobile phone and that number will not accept outside calls.  I will try again later and also wait for him to return my call.  He needs to go to ER.    Jenna Luo, MD.  (on call for [facility])  Addendum 9:15.  Was finally able to locate patient at his daughter's home  [phone]) after calling his other daughter.  Explained to her that his blood counts are falling suggesting upper GI bleed and recommended ER evaluation.  She will take him to the ER ASAP.   Jenna Luo, MD

## 2016-11-06 ENCOUNTER — Ambulatory Visit: Payer: Medicare Other | Admitting: Neurology

## 2016-11-06 ENCOUNTER — Telehealth: Payer: Self-pay | Admitting: *Deleted

## 2016-11-06 DIAGNOSIS — K5730 Diverticulosis of large intestine without perforation or abscess without bleeding: Secondary | ICD-10-CM | POA: Diagnosis present

## 2016-11-06 DIAGNOSIS — K922 Gastrointestinal hemorrhage, unspecified: Secondary | ICD-10-CM | POA: Diagnosis present

## 2016-11-06 DIAGNOSIS — D62 Acute posthemorrhagic anemia: Secondary | ICD-10-CM | POA: Diagnosis not present

## 2016-11-06 DIAGNOSIS — K648 Other hemorrhoids: Secondary | ICD-10-CM | POA: Diagnosis not present

## 2016-11-06 DIAGNOSIS — K7030 Alcoholic cirrhosis of liver without ascites: Secondary | ICD-10-CM | POA: Diagnosis present

## 2016-11-06 DIAGNOSIS — K921 Melena: Secondary | ICD-10-CM | POA: Diagnosis present

## 2016-11-06 DIAGNOSIS — Z7982 Long term (current) use of aspirin: Secondary | ICD-10-CM | POA: Diagnosis not present

## 2016-11-06 DIAGNOSIS — N400 Enlarged prostate without lower urinary tract symptoms: Secondary | ICD-10-CM | POA: Diagnosis not present

## 2016-11-06 DIAGNOSIS — F329 Major depressive disorder, single episode, unspecified: Secondary | ICD-10-CM | POA: Diagnosis present

## 2016-11-06 DIAGNOSIS — K640 First degree hemorrhoids: Secondary | ICD-10-CM | POA: Diagnosis present

## 2016-11-06 DIAGNOSIS — Z800 Family history of malignant neoplasm of digestive organs: Secondary | ICD-10-CM | POA: Diagnosis not present

## 2016-11-06 DIAGNOSIS — N182 Chronic kidney disease, stage 2 (mild): Secondary | ICD-10-CM | POA: Diagnosis present

## 2016-11-06 DIAGNOSIS — Z87891 Personal history of nicotine dependence: Secondary | ICD-10-CM | POA: Diagnosis not present

## 2016-11-06 DIAGNOSIS — Z79899 Other long term (current) drug therapy: Secondary | ICD-10-CM | POA: Diagnosis not present

## 2016-11-06 DIAGNOSIS — F1010 Alcohol abuse, uncomplicated: Secondary | ICD-10-CM | POA: Diagnosis not present

## 2016-11-06 DIAGNOSIS — R112 Nausea with vomiting, unspecified: Secondary | ICD-10-CM | POA: Diagnosis present

## 2016-11-06 DIAGNOSIS — I129 Hypertensive chronic kidney disease with stage 1 through stage 4 chronic kidney disease, or unspecified chronic kidney disease: Secondary | ICD-10-CM | POA: Diagnosis present

## 2016-11-06 LAB — ABO/RH: ABO/RH(D): A POS

## 2016-11-06 LAB — BASIC METABOLIC PANEL
ANION GAP: 4 — AB (ref 5–15)
BUN: 11 mg/dL (ref 6–20)
CALCIUM: 8.4 mg/dL — AB (ref 8.9–10.3)
CO2: 25 mmol/L (ref 22–32)
CREATININE: 0.94 mg/dL (ref 0.61–1.24)
Chloride: 108 mmol/L (ref 101–111)
GLUCOSE: 105 mg/dL — AB (ref 65–99)
Potassium: 3.8 mmol/L (ref 3.5–5.1)
Sodium: 137 mmol/L (ref 135–145)

## 2016-11-06 LAB — CBC
HCT: 26 % — ABNORMAL LOW (ref 39.0–52.0)
HEMOGLOBIN: 8.1 g/dL — AB (ref 13.0–17.0)
MCH: 25.1 pg — AB (ref 26.0–34.0)
MCHC: 31.2 g/dL (ref 30.0–36.0)
MCV: 80.5 fL (ref 78.0–100.0)
PLATELETS: 134 10*3/uL — AB (ref 150–400)
RBC: 3.23 MIL/uL — ABNORMAL LOW (ref 4.22–5.81)
RDW: 17.9 % — ABNORMAL HIGH (ref 11.5–15.5)
WBC: 4.8 10*3/uL (ref 4.0–10.5)

## 2016-11-06 LAB — AMMONIA: Ammonia: 23 umol/L (ref 9–35)

## 2016-11-06 LAB — PREPARE RBC (CROSSMATCH)

## 2016-11-06 MED ORDER — ACETAMINOPHEN 325 MG PO TABS: 650.0000 mg | ORAL_TABLET | Freq: Four times a day (QID) | ORAL | Status: DC | PRN

## 2016-11-06 MED ORDER — TRAZODONE HCL 100 MG PO TABS
100.0000 mg | ORAL_TABLET | Freq: Every day | ORAL | Status: DC
  Filled 2016-11-06: qty 1

## 2016-11-06 MED ORDER — SODIUM CHLORIDE 0.9 % IV SOLN
Freq: Once | INTRAVENOUS | Status: AC
  Administered 2016-11-06: 01:00:00 via INTRAVENOUS

## 2016-11-06 MED ORDER — TAMSULOSIN HCL 0.4 MG PO CAPS
0.4000 mg | ORAL_CAPSULE | Freq: Every day | ORAL | Status: DC
  Administered 2016-11-06 – 2016-11-09 (×5): 0.4 mg via ORAL
  Filled 2016-11-06 (×5): qty 1

## 2016-11-06 MED ORDER — LORAZEPAM 1 MG PO TABS: 1.0000 mg | ORAL_TABLET | Freq: Four times a day (QID) | ORAL | Status: AC | PRN

## 2016-11-06 MED ORDER — TRAZODONE HCL 50 MG PO TABS
100.0000 mg | ORAL_TABLET | Freq: Every day | ORAL | Status: DC
  Administered 2016-11-06 – 2016-11-09 (×4): 100 mg via ORAL
  Filled 2016-11-06 (×4): qty 2

## 2016-11-06 MED ORDER — SODIUM CHLORIDE 0.9 % IV SOLN: INTRAVENOUS | Status: DC

## 2016-11-06 MED ORDER — VITAMIN B-1 100 MG PO TABS
100.0000 mg | ORAL_TABLET | Freq: Every day | ORAL | Status: DC
  Administered 2016-11-06 – 2016-11-09 (×4): 100 mg via ORAL
  Filled 2016-11-06 (×4): qty 1

## 2016-11-06 MED ORDER — FOLIC ACID 1 MG PO TABS
1.0000 mg | ORAL_TABLET | Freq: Every day | ORAL | Status: DC
  Administered 2016-11-06 – 2016-11-09 (×4): 1 mg via ORAL
  Filled 2016-11-06 (×4): qty 1

## 2016-11-06 MED ORDER — BACLOFEN 20 MG PO TABS
20.0000 mg | ORAL_TABLET | Freq: Two times a day (BID) | ORAL | Status: DC | PRN
  Administered 2016-11-07: 20 mg via ORAL
  Filled 2016-11-06: qty 2

## 2016-11-06 MED ORDER — ACETAMINOPHEN 650 MG RE SUPP: 650.0000 mg | Freq: Four times a day (QID) | RECTAL | Status: DC | PRN

## 2016-11-06 MED ORDER — QUETIAPINE FUMARATE 25 MG PO TABS
50.0000 mg | ORAL_TABLET | Freq: Every day | ORAL | Status: DC
  Administered 2016-11-06 – 2016-11-09 (×5): 50 mg via ORAL
  Filled 2016-11-06 (×5): qty 2

## 2016-11-06 MED ORDER — LORAZEPAM 2 MG/ML IJ SOLN: 1.0000 mg | Freq: Four times a day (QID) | INTRAMUSCULAR | Status: AC | PRN

## 2016-11-06 MED ORDER — NICOTINE 21 MG/24HR TD PT24
21.0000 mg | MEDICATED_PATCH | Freq: Once | TRANSDERMAL | Status: AC
  Administered 2016-11-06: 21 mg via TRANSDERMAL
  Filled 2016-11-06: qty 1

## 2016-11-06 MED ORDER — ALBUTEROL SULFATE (2.5 MG/3ML) 0.083% IN NEBU: 2.5000 mg | INHALATION_SOLUTION | RESPIRATORY_TRACT | Status: DC | PRN

## 2016-11-06 MED ORDER — PEG 3350-KCL-NA BICARB-NACL 420 G PO SOLR
4000.0000 mL | Freq: Once | ORAL | Status: AC
  Administered 2016-11-07: 4000 mL via ORAL

## 2016-11-06 MED ORDER — THIAMINE HCL 100 MG/ML IJ SOLN: 100.0000 mg | Freq: Every day | INTRAMUSCULAR | Status: DC

## 2016-11-06 MED ORDER — ONDANSETRON HCL 4 MG PO TABS
4.0000 mg | ORAL_TABLET | Freq: Four times a day (QID) | ORAL | Status: DC | PRN
  Filled 2016-11-06: qty 1

## 2016-11-06 MED ORDER — METOPROLOL TARTRATE 25 MG PO TABS: 25.0000 mg | ORAL_TABLET | Freq: Two times a day (BID) | ORAL | Status: DC

## 2016-11-06 MED ORDER — PANTOPRAZOLE SODIUM 40 MG IV SOLR
40.0000 mg | Freq: Two times a day (BID) | INTRAVENOUS | Status: DC
  Administered 2016-11-06 (×2): 40 mg via INTRAVENOUS
  Filled 2016-11-06 (×2): qty 40

## 2016-11-06 MED ORDER — ADULT MULTIVITAMIN W/MINERALS CH
1.0000 | ORAL_TABLET | Freq: Every day | ORAL | Status: DC
  Administered 2016-11-06 – 2016-11-09 (×4): 1 via ORAL
  Filled 2016-11-06 (×4): qty 1

## 2016-11-06 MED ORDER — METOPROLOL TARTRATE 25 MG PO TABS
12.5000 mg | ORAL_TABLET | Freq: Two times a day (BID) | ORAL | Status: DC
  Administered 2016-11-06 – 2016-11-09 (×8): 12.5 mg via ORAL
  Filled 2016-11-06 (×8): qty 1

## 2016-11-06 MED ORDER — ONDANSETRON HCL 4 MG/2ML IJ SOLN: 4.0000 mg | Freq: Four times a day (QID) | INTRAMUSCULAR | Status: DC | PRN

## 2016-11-06 MED ORDER — SODIUM CHLORIDE 0.9 % IV SOLN
8.0000 mg/h | INTRAVENOUS | Status: DC
  Administered 2016-11-06: 8 mg/h via INTRAVENOUS
  Filled 2016-11-06 (×2): qty 80

## 2016-11-06 MED ORDER — TRAZODONE HCL 50 MG PO TABS
100.0000 mg | ORAL_TABLET | ORAL | Status: AC
  Administered 2016-11-06: 100 mg via ORAL
  Filled 2016-11-06: qty 2

## 2016-11-06 NOTE — Care Management Obs Status (Signed)
[facility] NOTIFICATION   Patient Details  Name: Barry Mccoy MRN: [id number] Date of Birth: [birth year]   Medicare Observation Status Notification Given:  Yes    Dessa Phi, RN 11/06/2016, 3:21 PM

## 2016-11-06 NOTE — Progress Notes (Signed)
Patient seen and examined. Admitted after midnight secondary to melanotic stools and increased fatigue. Found to have Hgb of 7.1 down from 10.2 (on 2/16). Denies CP, SOB and abd pain. Hemodynamically stable and in no distress currently. Please refer to H&P written by Dr. Tamala Julian for further info/details on admission.  Plan: -s/p 2 units of PRBC's -Hgb 8.1 currently -will follow Hgb trend and transfuse as needed  -GI consulted (Dr. Michail Sermon) will follow rec's  Barry Mccoy [phone]

## 2016-11-06 NOTE — Telephone Encounter (Signed)
Daughter called to cancel less than 24 hours prior to appt time.

## 2016-11-06 NOTE — H&P (Signed)
History and Physical    Barry Mccoy [id number] DOB: 07-30-1951 DOA: 11/05/2016  Referring MD/NP/PA: Dr. Gilford Raid PCP: Karis Juba, PA-C  Patient coming from:   Chief Complaint: Abnormal lab work  HPI: Barry Mccoy is a 66 y.o. male with medical history significant of alcohol abuse, anxiety, depression, and CKD stage II; who presents after being instructed to come in for low blood counts. Previously admitted from 1/22 through 2/13 for acute GI bleed with symptomatic anemia. During hospitalization patient was transfused 5 units of PRBC and required intubation thought secondary to acute respiratory failure with hypoxia associated with delirium from alcohol withdrawal. He also underwent a EGDon 1/25 by Dr. Benson Norway, but no active source of bleeding was noted. Patient was discharged to a skilled nursing facility, but developed confusion and agitation and was admitted back into the hospital from 2/13 through 2/17. Patient was ultimately discharged home with 24/7 family supervision and home health services. Patient reports waking up yesterday morning with some generalized abdominal pain and noted increased activity. Associated symptoms include black stools1-2/day and nausea with at least 4 episodes of nonbloody emesis. Patient denies having any chest pain, shortness of breath, lightheadedness, loss of consciousness, or recent NSAID use. Prior to his first admission in 09/2016 he was taken Advil Cold and Sinus daily. Yesterday(1/26), his primary care office called and notified him that his hemoglobin dropped significantly and that he needs to come to the emergency department for further evaluation.   ED Course: Upon admission into the emergency department patient was seen to have vital signs relatively within normal limits. Lab work revealed hemoglobin 7.1(previously 10.2 on 2/16) with guaiac positive stools. Chest x-ray showed vascular congestion with peribronchial thickening. Patient was started on IVF,  Protonix drip, and ordered to be transfused 2 units of PRBC. [facility] called to admit.  Review of Systems: As per HPI otherwise 10 point review of systems negative.   Past Medical History:  Diagnosis Date  . Anxiety   . BPH (benign prostatic hyperplasia)   . Cervical spine disease   . Chronic kidney disease    kidney stones  . Depression   . Disorder of lumbar spine   . ETOH abuse   . GI bleed   . Muscle spasm     Past Surgical History:  Procedure Laterality Date  . ELBOW SURGERY     as child  . ELBOW SURGERY    . ESOPHAGOGASTRODUODENOSCOPY N/A 10/04/2016   Procedure: ESOPHAGOGASTRODUODENOSCOPY (EGD);  Surgeon: Carol Ada, MD;  Location: [facility];  Service: Gastroenterology;  Laterality: N/A;     reports that he quit smoking about 2 weeks ago. His smoking use included Cigarettes. He has a 50.00 pack-year smoking history. He has never used smokeless tobacco. He reports that he does not drink alcohol or use drugs.  Allergies  Allergen Reactions  . Shellfish Allergy Other (See Comments)    Unknown     Family History  Problem Relation Age of Onset  . Colon cancer Brother   . Alcohol abuse Brother   . Cancer Brother   . Early death Brother   . Prostate cancer Brother   . Colon polyps Brother   . Cancer Mother 54    colon cancer  . Colon cancer Mother     Prior to Admission medications   Medication Sig Start Date End Date Taking? Authorizing Provider  acetaminophen (TYLENOL) 325 MG tablet Take 2 tablets (650 mg total) by mouth every 6 (six) hours as needed for mild pain, moderate  pain or headache (or Fever >/= 101). 10/27/16  Yes Clanford Marisa Hua, MD  aspirin EC 81 MG tablet Take 81 mg by mouth every morning.    Yes Michel Santee, MD  baclofen (LIORESAL) 20 MG tablet TAKE 1 TABLET BY MOUTH EVERY 12 HOURS AS NEEDED FOR SPASMS 10/30/16  Yes [name], PA-C  Cholecalciferol (VITAMIN D-3) 5000 units TABS Take 5,000 Units by mouth at bedtime.   Yes Historical Provider, MD    folic acid (FOLVITE) 1 MG tablet Take 1 tablet (1 mg total) by mouth daily. Patient taking differently: Take 1 mg by mouth every morning.  10/28/16  Yes Clanford Marisa Hua, MD  lidocaine (LIDODERM) 5 % Place 1 patch onto the skin every 12 (twelve) hours as needed (pain).  09/12/16  Yes Historical Provider, MD  metoprolol tartrate (LOPRESSOR) 25 MG tablet Take 1 tablet (25 mg total) by mouth 2 (two) times daily. 10/30/16  Yes [name], PA-C  Multiple Vitamin (MULTIVITAMIN WITH MINERALS) TABS tablet Take 1 tablet by mouth at bedtime.   Yes Historical Provider, MD  nicotine (NICODERM CQ - DOSED IN MG/24 HOURS) 14 mg/24hr patch Place 1 patch (14 mg total) onto the skin daily. 10/20/16  Yes Domenic Polite, MD  OnabotulinumtoxinA (BOTOX IJ) Inject as directed See admin instructions. Botox injections done at Dr. Audery Amel office approximately every 90 days   Yes Historical Provider, MD  polyethylene glycol (MIRALAX / GLYCOLAX) packet Take 17 g by mouth daily as needed for mild constipation. 10/27/16  Yes Clanford Marisa Hua, MD  QUEtiapine (SEROQUEL) 50 MG tablet Take 1 tablet (50 mg total) by mouth at bedtime. 10/30/16  Yes Mary B Dixon, PA-C  sulfamethoxazole-trimethoprim (BACTRIM DS,SEPTRA DS) 800-160 MG tablet Take 1 tablet by mouth every 12 (twelve) hours.   Yes Historical Provider, MD  tamsulosin (FLOMAX) 0.4 MG CAPS capsule Take 1 capsule (0.4 mg total) by mouth at bedtime. 10/30/16  Yes [name], PA-C  thiamine 100 MG tablet Take 1 tablet (100 mg total) by mouth daily. For 4days then down to 100mg  daily Patient taking differently: Take 250 mg by mouth daily. For 4days then down to 100mg  daily 10/27/16  Yes Clanford Marisa Hua, MD  traZODone (DESYREL) 100 MG tablet Take 1 tablet (100 mg total) by mouth at bedtime. 10/27/16  Yes Clanford Marisa Hua, MD  pantoprazole (PROTONIX) 40 MG tablet Take 1 tablet (40 mg total) by mouth daily. Patient not taking: Reported on 11/06/2016 10/30/16   [name], PA-C   sertraline (ZOLOFT) 50 MG tablet Take 1 tablet (50 mg total) by mouth at bedtime. Patient not taking: Reported on 11/05/2016 10/30/16   [name], PA-C    Physical Exam:   Constitutional:Elderly male who appears to be in no acute distress Vitals:   11/05/16 2207 11/05/16 2356  BP: 134/71 102/61  Pulse: 84 63  Resp: 18 20  Temp: 98 F (36.7 C)   TempSrc: Oral   SpO2: 100% 98%   Eyes: PERRL, lids and conjunctivae normal ENMT: Mucous membranes are moist. Posterior pharynx clear of any exudate or lesions.Normal dentition.  Neck: normal, supple, no masses, no thyromegaly Respiratory: clear to auscultation bilaterally, no wheezing, no crackles. Normal respiratory effort. No accessory muscle use.  Cardiovascular: Regular rate and rhythm, no murmurs / rubs / gallops. No extremity edema. 2+ pedal pulses. No carotid bruits.  Abdomen: no tenderness, no masses palpated. No hepatosplenomegaly. Bowel sounds positive.  Musculoskeletal: no clubbing / cyanosis. No joint  deformity upper and lower extremities. Good ROM, no contractures. Normal muscle tone.  Skin: no rashes, lesions, ulcers. No induration Neurologic: CN 2-12 grossly intact. Sensation intact, DTR normal. Strength 5/5 in all 4.  Psychiatric: Normal judgment and insight. Alert and oriented x 3. Normal mood.     Labs on Admission: I have personally reviewed following labs and imaging studies  CBC:  Recent Labs Lab 11/05/16 1123 11/05/16 2250  WBC 5.2 6.4  NEUTROABS 2,392  --   HGB 7.6* 7.1*  HCT 25.4* 22.6*  MCV 82.2 79.3  PLT 177 0000000   Basic Metabolic Panel:  Recent Labs Lab 11/05/16 1123 11/05/16 2250  NA 141 135  K 4.2 4.0  CL 105 103  CO2 28 26  GLUCOSE 137* 93  BUN 8 10  CREATININE 1.06 1.10  CALCIUM 9.1 9.1  MG 2.0  --    GFR: Estimated Creatinine Clearance: 75.8 mL/min (by C-G formula based on SCr of 1.1 mg/dL). Liver Function Tests:  Recent Labs Lab 11/05/16 1123  AST 14  ALT 14  ALKPHOS 60   BILITOT 0.3  PROT 5.8*  ALBUMIN 3.6   No results for input(s): LIPASE, AMYLASE in the last 168 hours.  Recent Labs Lab 11/05/16 2332  AMMONIA 23   Coagulation Profile:  Recent Labs Lab 11/05/16 2250  INR 0.95   Cardiac Enzymes: No results for input(s): CKTOTAL, CKMB, CKMBINDEX, TROPONINI in the last 168 hours. BNP (last 3 results) No results for input(s): PROBNP in the last 8760 hours. HbA1C: No results for input(s): HGBA1C in the last 72 hours. CBG:  Recent Labs Lab 11/05/16 2328  GLUCAP 89   Lipid Profile: No results for input(s): CHOL, HDL, LDLCALC, TRIG, CHOLHDL, LDLDIRECT in the last 72 hours. Thyroid Function Tests: No results for input(s): TSH, T4TOTAL, FREET4, T3FREE, THYROIDAB in the last 72 hours. Anemia Panel: No results for input(s): VITAMINB12, FOLATE, FERRITIN, TIBC, IRON, RETICCTPCT in the last 72 hours. Urine analysis:    Component Value Date/Time   COLORURINE YELLOW 11/05/2016 2328   APPEARANCEUR CLEAR 11/05/2016 2328   LABSPEC 1.011 11/05/2016 2328   PHURINE 6.0 11/05/2016 2328   GLUCOSEU NEGATIVE 11/05/2016 2328   HGBUR SMALL (A) 11/05/2016 2328   BILIRUBINUR NEGATIVE 11/05/2016 [place] 11/05/2016 2328   PROTEINUR NEGATIVE 11/05/2016 2328   NITRITE NEGATIVE 11/05/2016 2328   LEUKOCYTESUR TRACE (A) 11/05/2016 2328   Sepsis Labs: No results found for this or any previous visit (from the past 240 hour(s)).   Radiological Exams on Admission: Dg Chest Portable 1 View  Result Date: 11/05/2016 CLINICAL DATA:  Acute onset of decreased hemoglobin. GI bleeding. Generalized weakness. Initial encounter. EXAM: PORTABLE CHEST 1 VIEW COMPARISON:  CT of the thoracic spine performed 07/21/2012 FINDINGS: The lungs are well-aerated. Vascular congestion is noted. Peribronchial thickening is seen. There is no evidence of pleural effusion or pneumothorax. The cardiomediastinal silhouette is within normal limits. No acute osseous abnormalities  are seen. IMPRESSION: Vascular congestion.  Peribronchial thickening seen. Electronically Signed   By: Garald Balding M.D.   On: 11/05/2016 23:45   Assessment/Plan GI bleed with acute blood loss anemia : Acute. Patient presents with hemoglobin of 7.1 previously 10.2 just 10 days prior. Found to be guaiac stool positive. Patient ordered to be transfused 2 units of PRBCs in the ED. Previously had EGD performed by Dr. Benson Norway 1/25 revealing portal hypertensive gastropathy versus inflammatory gastritis. - Admit to telemetry bed - Continue with transfusion of 2 units of PRBCs -  Protonix gtt, but will likely be able to discontinue this - recheck CBC in a.m.  - Will need to consult GI in a.m. for possible need of colonoscopy.   Nausea and vomiting - Zofran prn  Essential Hypertension - Continue metoprolol  BPH - Continue Flomax  Tobacco abuse: Patient reports no recent use of tobacco and reports quitting with nicotine patch. - Continue nicotine patch  Alcohol abuse history: Patient reports no recent use, but recent admission thought to gone into alcohol withdrawals. - CWIA  - Continue folic acid and thiamine DVT prophylaxis: SCD   Code Status: Full Family Communication: Not family present at bedside Disposition Plan: Likely discharge home once medically stable Consults called: none  Admission status: Observation  Norval Morton MD [facility] Pager [phone]  If 7PM-7AM, please contact night-coverage [email or web address] Password [email or web address]  11/06/2016, 1:16 AM

## 2016-11-06 NOTE — Consult Note (Signed)
Referring Provider: Dr. Dyann Kief Primary Care Physician:  Karis Juba, PA-C Primary Gastroenterologist:  Althia Forts  Reason for Consultation:  GI bleed; Anemia  HPI: Barry Mccoy is a 66 y.o. male with history of alcohol abuse (reports quitting in Spring 2017), chronic kidney disease and anemia who had a GI bleed last month and an EGD by Dr. Benson Norway showed portal gastropathy vs gastritis and no active bleeding. He reports having a colonoscopy in [place] last April and says some polyps were removed (his daughter confirms). Nonbloody vomitus this week X 1. Has been having black stools 1-2 times per day but had several episodes of black stools yesterday. Denies hematochezia. Denies abdominal pain. Denies dizziness or lightheadedness. Hgb 7.6 on admit yesterday (10.2 on 10/26/16). Hemodynamically stable. Has been on NSAIDs chronically until last month's hospitalization. Adamantly denies any alcohol since early last year. Has never had a capsule endoscopy. Brother and mother had colon cancer.  Past Medical History:  Diagnosis Date  . Anxiety   . BPH (benign prostatic hyperplasia)   . Cervical spine disease   . Chronic kidney disease    kidney stones  . Depression   . Disorder of lumbar spine   . ETOH abuse   . GI bleed   . Muscle spasm     Past Surgical History:  Procedure Laterality Date  . ELBOW SURGERY     as child  . ELBOW SURGERY    . ESOPHAGOGASTRODUODENOSCOPY N/A 10/04/2016   Procedure: ESOPHAGOGASTRODUODENOSCOPY (EGD);  Surgeon: Carol Ada, MD;  Location: [facility];  Service: Gastroenterology;  Laterality: N/A;    Prior to Admission medications   Medication Sig Start Date End Date Taking? Authorizing Provider  acetaminophen (TYLENOL) 325 MG tablet Take 2 tablets (650 mg total) by mouth every 6 (six) hours as needed for mild pain, moderate pain or headache (or Fever >/= 101). 10/27/16  Yes Clanford Marisa Hua, MD  aspirin EC 81 MG tablet Take 81 mg by mouth every morning.    Yes  Michel Santee, MD  baclofen (LIORESAL) 20 MG tablet TAKE 1 TABLET BY MOUTH EVERY 12 HOURS AS NEEDED FOR SPASMS 10/30/16  Yes [name], PA-C  Cholecalciferol (VITAMIN D-3) 5000 units TABS Take 5,000 Units by mouth at bedtime.   Yes Historical Provider, MD  folic acid (FOLVITE) 1 MG tablet Take 1 tablet (1 mg total) by mouth daily. Patient taking differently: Take 1 mg by mouth every morning.  10/28/16  Yes Clanford Marisa Hua, MD  lidocaine (LIDODERM) 5 % Place 1 patch onto the skin every 12 (twelve) hours as needed (pain).  09/12/16  Yes Historical Provider, MD  metoprolol tartrate (LOPRESSOR) 25 MG tablet Take 1 tablet (25 mg total) by mouth 2 (two) times daily. 10/30/16  Yes [name], PA-C  Multiple Vitamin (MULTIVITAMIN WITH MINERALS) TABS tablet Take 1 tablet by mouth at bedtime.   Yes Historical Provider, MD  nicotine (NICODERM CQ - DOSED IN MG/24 HOURS) 14 mg/24hr patch Place 1 patch (14 mg total) onto the skin daily. 10/20/16  Yes Domenic Polite, MD  OnabotulinumtoxinA (BOTOX IJ) Inject as directed See admin instructions. Botox injections done at Dr. Audery Amel office approximately every 90 days   Yes Historical Provider, MD  polyethylene glycol (MIRALAX / GLYCOLAX) packet Take 17 g by mouth daily as needed for mild constipation. 10/27/16  Yes Clanford Marisa Hua, MD  QUEtiapine (SEROQUEL) 50 MG tablet Take 1 tablet (50 mg total) by mouth at bedtime. 10/30/16  Yes [name],  PA-C  sulfamethoxazole-trimethoprim (BACTRIM DS,SEPTRA DS) 800-160 MG tablet Take 1 tablet by mouth every 12 (twelve) hours.   Yes Historical Provider, MD  tamsulosin (FLOMAX) 0.4 MG CAPS capsule Take 1 capsule (0.4 mg total) by mouth at bedtime. 10/30/16  Yes [name], PA-C  thiamine 100 MG tablet Take 1 tablet (100 mg total) by mouth daily. For 4days then down to 100mg  daily Patient taking differently: Take 250 mg by mouth daily. For 4days then down to 100mg  daily 10/27/16  Yes Clanford Marisa Hua, MD  traZODone (DESYREL)  100 MG tablet Take 1 tablet (100 mg total) by mouth at bedtime. 10/27/16  Yes Clanford Marisa Hua, MD  pantoprazole (PROTONIX) 40 MG tablet Take 1 tablet (40 mg total) by mouth daily. Patient not taking: Reported on 11/06/2016 10/30/16   [name], PA-C  sertraline (ZOLOFT) 50 MG tablet Take 1 tablet (50 mg total) by mouth at bedtime. Patient not taking: Reported on 11/05/2016 10/30/16   [name], PA-C    Scheduled Meds: . folic acid  1 mg Oral Daily  . metoprolol tartrate  12.5 mg Oral BID  . multivitamin with minerals  1 tablet Oral QHS  . nicotine  21 mg Transdermal Once  . pantoprazole (PROTONIX) IV  40 mg Intravenous Q12H  . [START ON 11/07/2016] polyethylene glycol-electrolytes  4,000 mL Oral Once  . QUEtiapine  50 mg Oral QHS  . tamsulosin  0.4 mg Oral QHS  . thiamine  100 mg Oral Daily   Or  . thiamine  100 mg Intravenous Daily  . traZODone  100 mg Oral QHS   Continuous Infusions: . sodium chloride 50 mL/hr at 11/06/16 0801  . sodium chloride     PRN Meds:.acetaminophen **OR** acetaminophen, albuterol, baclofen, LORazepam **OR** LORazepam, ondansetron **OR** ondansetron (ZOFRAN) IV  Allergies as of 11/05/2016 - Review Complete 11/05/2016  Allergen Reaction Noted  . Shellfish allergy Other (See Comments) 10/04/2016    Family History  Problem Relation Age of Onset  . Colon cancer Brother   . Alcohol abuse Brother   . Cancer Brother   . Early death Brother   . Prostate cancer Brother   . Colon polyps Brother   . Cancer Mother 66    colon cancer  . Colon cancer Mother     Social History   Social History  . Marital status: Widowed    Spouse name: N/A  . Number of children: N/A  . Years of education: N/A   Occupational History  . Not on file.   Social History Main Topics  . Smoking status: Former Smoker    Packs/day: 1.00    Years: 50.00    Types: Cigarettes    Quit date: 10/23/2016  . Smokeless tobacco: Never Used     Comment: attempting to quit- down  to 8 a day   . Alcohol use No     Comment: 2 drinks a month  . Drug use: No  . Sexual activity: Yes   Other Topics Concern  . Not on file   Social History Narrative   ** Merged History Encounter **        Review of Systems: All negative except as stated above in HPI.  Physical Exam: Vital signs: Vitals:   11/06/16 0738 11/06/16 1335  BP: 115/75 (!) 102/57  Pulse: 70 69  Resp: 12 16  Temp: 98.2 F (36.8 C) 98.1 F (36.7 C)   Last BM Date: 11/05/16 General:   Alert,  Well-developed, well-nourished, pleasant and cooperative in NAD HEENT: anicteric sclera, oropharynx clear Lungs:  Clear throughout to auscultation.   No wheezes, crackles, or rhonchi. No acute distress. Heart:  Regular rate and rhythm; no murmurs, clicks, rubs,  or gallops. Abdomen: left-sided tenderness with guarding, soft, nondistended, +BS Rectal:  Deferred Ext: no edema  GI:  Lab Results:  Recent Labs  11/05/16 1123 11/05/16 2250 11/06/16 1102  WBC 5.2 6.4 4.8  HGB 7.6* 7.1* 8.1*  HCT 25.4* 22.6* 26.0*  PLT 177 188 134*   BMET  Recent Labs  11/05/16 1123 11/05/16 2250 11/06/16 1102  NA 141 135 137  K 4.2 4.0 3.8  CL 105 103 108  CO2 28 26 25   GLUCOSE 137* 93 105*  BUN 8 10 11   CREATININE 1.06 1.10 0.94  CALCIUM 9.1 9.1 8.4*   LFT  Recent Labs  11/05/16 1123  PROT 5.8*  ALBUMIN 3.6  AST 14  ALT 14  ALKPHOS 60  BILITOT 0.3   PT/INR  Recent Labs  11/05/16 2250  LABPROT 12.6  INR 0.95     Studies/Results: Dg Chest Portable 1 View  Result Date: 11/05/2016 CLINICAL DATA:  Acute onset of decreased hemoglobin. GI bleeding. Generalized weakness. Initial encounter. EXAM: PORTABLE CHEST 1 VIEW COMPARISON:  CT of the thoracic spine performed 07/21/2012 FINDINGS: The lungs are well-aerated. Vascular congestion is noted. Peribronchial thickening is seen. There is no evidence of pleural effusion or pneumothorax. The cardiomediastinal silhouette is within normal limits. No acute  osseous abnormalities are seen. IMPRESSION: Vascular congestion.  Peribronchial thickening seen. Electronically Signed   By: Garald Balding M.D.   On: 11/05/2016 23:45    Impression/Plan: 66yo with melena and worsening anemia with an EGD last month by Dr. Benson Norway that was unrevealing for a source. Will do an updated colonoscopy to further evaluate and if that is unrevealing will need a capsule endoscopy to further evaluate. Full liquids for dinner and then clear liquids tomorrow. Colon prep tomorrow. Colonoscopy scheduled for 11/08/16 at 11 AM.     LOS: 0 days   [place] C.  11/06/2016, 3:06 PM  Pager [phone]  If no answer or after 5 PM call [phone]

## 2016-11-07 ENCOUNTER — Encounter: Payer: Self-pay | Admitting: Neurology

## 2016-11-07 DIAGNOSIS — F1010 Alcohol abuse, uncomplicated: Secondary | ICD-10-CM

## 2016-11-07 DIAGNOSIS — K922 Gastrointestinal hemorrhage, unspecified: Secondary | ICD-10-CM

## 2016-11-07 DIAGNOSIS — D62 Acute posthemorrhagic anemia: Secondary | ICD-10-CM

## 2016-11-07 DIAGNOSIS — R112 Nausea with vomiting, unspecified: Secondary | ICD-10-CM

## 2016-11-07 LAB — TYPE AND SCREEN
ABO/RH(D): A POS
Antibody Screen: NEGATIVE
Unit division: 0
Unit division: 0

## 2016-11-07 LAB — BPAM RBC
BLOOD PRODUCT EXPIRATION DATE: 201803192359
Blood Product Expiration Date: 201803192359
ISSUE DATE / TIME: 201802270208
ISSUE DATE / TIME: 201802270515
UNIT TYPE AND RH: 6200
UNIT TYPE AND RH: 6200

## 2016-11-07 LAB — CBC
HCT: 25.4 % — ABNORMAL LOW (ref 39.0–52.0)
HEMOGLOBIN: 8 g/dL — AB (ref 13.0–17.0)
MCH: 25.9 pg — AB (ref 26.0–34.0)
MCHC: 31.5 g/dL (ref 30.0–36.0)
MCV: 82.2 fL (ref 78.0–100.0)
PLATELETS: 128 10*3/uL — AB (ref 150–400)
RBC: 3.09 MIL/uL — ABNORMAL LOW (ref 4.22–5.81)
RDW: 18.5 % — AB (ref 11.5–15.5)
WBC: 5.4 10*3/uL (ref 4.0–10.5)

## 2016-11-07 LAB — URINE CULTURE: Organism ID, Bacteria: NO GROWTH

## 2016-11-07 MED ORDER — PANTOPRAZOLE SODIUM 40 MG PO TBEC
40.0000 mg | DELAYED_RELEASE_TABLET | Freq: Two times a day (BID) | ORAL | Status: DC
  Administered 2016-11-07 – 2016-11-09 (×6): 40 mg via ORAL
  Filled 2016-11-07 (×6): qty 1

## 2016-11-07 NOTE — Progress Notes (Signed)
Assuming care of patient. Received report from Dennis Port, Therapist, sports. Agree with previous assessment. Pt resting comfortably, no needs at the moment. Will continue to monitor.

## 2016-11-07 NOTE — Progress Notes (Signed)
[facility] PROGRESS NOTE    Progress Note  Barry Mccoy  [id number] DOB: [birth year] DOA: 11/05/2016 PCP: Karis Juba, PA-C     Brief Narrative:   Barry Mccoy is an 66 y.o. male with medical history significant of alcohol abuse, anxiety, depression, and CKD stage II; who presents after being instructed to come in for low blood counts. Previously admitted from 1/22 through 2/13 for acute GI bleed with symptomatic anemia. During hospitalization patient was transfused 5 units of PRBC and required intubation thought secondary to acute respiratory failure with hypoxia associated with delirium from alcohol withdrawal. He also underwent a EGDon 1/25 by Dr. Benson Norway, but no active source of bleeding was noted.   Assessment/Plan:   Acute GI bleed with   Acute blood loss anemia: 10 days prior to admission his hemoglobin was 10.2, on admission 7.1. Was found to have guaiac positive. History status post 2 units of packed red blood cells, GI has been consulted, he was started on IV Protonix, GI recommended possible colonoscopy on 3.1.2018 Nothing by mouth after midnight continue clear liquid diet.  BPH (benign prostatic hyperplasia)  Nausea with vomiting: Continue Zofran for nausea.  Alcohol abuse Continue to monitor with CIWA.   DVT prophylaxis: SCD Family Communication:none Disposition Plan/Barrier to D/C: home in am Code Status:     Code Status Orders        Start     Ordered   11/06/16 0202  Full code  Continuous     11/06/16 0203    Code Status History    Date Active Date Inactive Code Status Order ID Comments User Context   10/23/2016 10:20 PM 10/27/2016  6:36 PM Full Code [id number]  Vianne Bulls, MD ED   10/01/2016 11:36 PM 10/23/2016 12:21 PM Full Code [id number]  Rise Patience, MD ED        IV Access:    Peripheral IV   Procedures and diagnostic studies:   Dg Chest Portable 1 View  Result Date: 11/05/2016 CLINICAL DATA:  Acute onset of decreased  hemoglobin. GI bleeding. Generalized weakness. Initial encounter. EXAM: PORTABLE CHEST 1 VIEW COMPARISON:  CT of the thoracic spine performed 07/21/2012 FINDINGS: The lungs are well-aerated. Vascular congestion is noted. Peribronchial thickening is seen. There is no evidence of pleural effusion or pneumothorax. The cardiomediastinal silhouette is within normal limits. No acute osseous abnormalities are seen. IMPRESSION: Vascular congestion.  Peribronchial thickening seen. Electronically Signed   By: Garald Balding M.D.   On: 11/05/2016 23:45     Medical Consultants:    None.  Anti-Infectives:   None  Subjective:    Barry Mccoy no complaints feels great now melanotic stools or bright red blood per rectum  Objective:    Vitals:   11/06/16 1335 11/06/16 2105 11/07/16 0037 11/07/16 0500  BP: (!) 102/57 110/62 137/66 131/60  Pulse: 69 98 77 76  Resp: 16  18 18   Temp: 98.1 F (36.7 C)  97.9 F (36.6 C) 97.9 F (36.6 C)  TempSrc: Oral  Oral Oral  SpO2: 100%  100% 100%  Weight:    96.1 kg (211 lb 13.8 oz)  Height:        Intake/Output Summary (Last 24 hours) at 11/07/16 0754 Last data filed at 11/07/16 0540  Gross per 24 hour  Intake          1713.75 ml  Output             3450 ml  Net         -  1736.25 ml   Filed Weights   11/06/16 0349 11/07/16 0500  Weight: 97.4 kg (214 lb 12.8 oz) 96.1 kg (211 lb 13.8 oz)    Exam: General exam: In no acute distress. Respiratory system: Good air movement and clear to auscultation. Cardiovascular system: S1 & S2 heard, RRR.  Gastrointestinal system: Abdomen is nondistended, soft and nontender.  Extremities: No pedal edema. Skin: No rashes, lesions or ulcers Psychiatry: Judgement and insight appear normal. Mood & affect appropriate.    Data Reviewed:    Labs: Basic Metabolic Panel:  Recent Labs Lab 11/05/16 1123 11/05/16 2250 11/06/16 1102  NA 141 135 137  K 4.2 4.0 3.8  CL 105 103 108  CO2 28 26 25   GLUCOSE 137* 93  105*  BUN 8 10 11   CREATININE 1.06 1.10 0.94  CALCIUM 9.1 9.1 8.4*  MG 2.0  --   --    GFR Estimated Creatinine Clearance: 88.7 mL/min (by C-G formula based on SCr of 0.94 mg/dL). Liver Function Tests:  Recent Labs Lab 11/05/16 1123  AST 14  ALT 14  ALKPHOS 60  BILITOT 0.3  PROT 5.8*  ALBUMIN 3.6   No results for input(s): LIPASE, AMYLASE in the last 168 hours.  Recent Labs Lab 11/05/16 2332  AMMONIA 23   Coagulation profile  Recent Labs Lab 11/05/16 2250  INR 0.95    CBC:  Recent Labs Lab 11/05/16 1123 11/05/16 2250 11/06/16 1102 11/07/16 0506  WBC 5.2 6.4 4.8 5.4  NEUTROABS 2,392  --   --   --   HGB 7.6* 7.1* 8.1* 8.0*  HCT 25.4* 22.6* 26.0* 25.4*  MCV 82.2 79.3 80.5 82.2  PLT 177 188 134* 128*   Cardiac Enzymes: No results for input(s): CKTOTAL, CKMB, CKMBINDEX, TROPONINI in the last 168 hours. BNP (last 3 results) No results for input(s): PROBNP in the last 8760 hours. CBG:  Recent Labs Lab 11/05/16 2328  GLUCAP 89   D-Dimer: No results for input(s): DDIMER in the last 72 hours. Hgb A1c: No results for input(s): HGBA1C in the last 72 hours. Lipid Profile: No results for input(s): CHOL, HDL, LDLCALC, TRIG, CHOLHDL, LDLDIRECT in the last 72 hours. Thyroid function studies: No results for input(s): TSH, T4TOTAL, T3FREE, THYROIDAB in the last 72 hours.  Invalid input(s): FREET3 Anemia work up: No results for input(s): VITAMINB12, FOLATE, FERRITIN, TIBC, IRON, RETICCTPCT in the last 72 hours. Sepsis Labs:  Recent Labs Lab 11/05/16 1123 11/05/16 2250 11/06/16 1102 11/07/16 0506  WBC 5.2 6.4 4.8 5.4   Microbiology Recent Results (from the past 240 hour(s))  Urine culture     Status: None   Collection Time: 11/05/16 11:23 AM  Result Value Ref Range Status   Organism ID, Bacteria NO GROWTH  Final     Medications:   . folic acid  1 mg Oral Daily  . metoprolol tartrate  12.5 mg Oral BID  . multivitamin with minerals  1 tablet  Oral QHS  . pantoprazole (PROTONIX) IV  40 mg Intravenous Q12H  . polyethylene glycol-electrolytes  4,000 mL Oral Once  . QUEtiapine  50 mg Oral QHS  . tamsulosin  0.4 mg Oral QHS  . thiamine  100 mg Oral Daily   Or  . thiamine  100 mg Intravenous Daily  . traZODone  100 mg Oral QHS   Continuous Infusions: . sodium chloride 50 mL/hr at 11/06/16 2314  . sodium chloride      Time spent: 25 min   LOS: 1 day  Charlynne Cousins  [facility] Pager [phone]  *Please refer to [email or web address], password [email or web address] to get updated schedule on who will round on this patient, as hospitalists switch teams weekly. If 7PM-7AM, please contact night-coverage at [email or web address], password [email or web address] for any overnight needs.  11/07/2016, 7:54 AM

## 2016-11-07 NOTE — Progress Notes (Signed)
Patient has had multiple watery stools. Stools are light yellow and consistent with prep. Patient was able to drink all the prep. Will continue to monitor patient.

## 2016-11-08 ENCOUNTER — Encounter (HOSPITAL_COMMUNITY)
Admission: EM | Disposition: A | Discharge status: Home / Self Care | Payer: Self-pay | Source: Home / Self Care | Attending: Internal Medicine

## 2016-11-08 ENCOUNTER — Encounter (HOSPITAL_COMMUNITY): Payer: Self-pay

## 2016-11-08 ENCOUNTER — Inpatient Hospital Stay (HOSPITAL_COMMUNITY): Payer: Medicare Other | Admitting: Certified Registered Nurse Anesthetist

## 2016-11-08 HISTORY — PX: COLONOSCOPY WITH PROPOFOL: SHX5780

## 2016-11-08 SURGERY — COLONOSCOPY WITH PROPOFOL
Anesthesia: Monitor Anesthesia Care

## 2016-11-08 MED ORDER — PROPOFOL 500 MG/50ML IV EMUL
INTRAVENOUS | Status: DC | PRN
  Administered 2016-11-08: 200 ug/kg/min via INTRAVENOUS

## 2016-11-08 MED ORDER — PROPOFOL 10 MG/ML IV BOLUS
INTRAVENOUS | Status: AC
  Filled 2016-11-08: qty 20

## 2016-11-08 MED ORDER — ONDANSETRON HCL 4 MG/2ML IJ SOLN
INTRAMUSCULAR | Status: DC | PRN
  Administered 2016-11-08: 4 mg via INTRAVENOUS

## 2016-11-08 MED ORDER — PROPOFOL 10 MG/ML IV BOLUS
INTRAVENOUS | Status: AC
  Filled 2016-11-08: qty 40

## 2016-11-08 MED ORDER — PROPOFOL 10 MG/ML IV BOLUS
INTRAVENOUS | Status: DC | PRN
  Administered 2016-11-08: 20 mg via INTRAVENOUS
  Administered 2016-11-08 (×3): 10 mg via INTRAVENOUS

## 2016-11-08 MED ORDER — LACTATED RINGERS IV SOLN
INTRAVENOUS | Status: DC
  Administered 2016-11-08: 1000 mL via INTRAVENOUS

## 2016-11-08 SURGICAL SUPPLY — 21 items

## 2016-11-08 NOTE — Anesthesia Postprocedure Evaluation (Signed)
Anesthesia Post Note  Patient: Barry Mccoy  Procedure(s) Performed: Procedure(s) (LRB): COLONOSCOPY WITH PROPOFOL (N/A)  Patient location during evaluation: PACU Anesthesia Type: MAC Level of consciousness: awake and alert Pain management: pain level controlled Vital Signs Assessment: post-procedure vital signs reviewed and stable Respiratory status: spontaneous breathing, nonlabored ventilation, respiratory function stable and patient connected to nasal cannula oxygen Cardiovascular status: stable and blood pressure returned to baseline Anesthetic complications: no       Last Vitals:  Vitals:   11/08/16 1020 11/08/16 1146  BP: 110/61   Pulse: 73 62  Resp: 17 17  Temp: 36.9 C     Last Pain:  Vitals:   11/08/16 1108  TempSrc:   PainSc: 0-No pain                 Montez Hageman

## 2016-11-08 NOTE — Interval H&P Note (Signed)
History and Physical Interval Note:  11/08/2016 11:05 AM  Barry Mccoy  has presented today for surgery, with the diagnosis of melena  The various methods of treatment have been discussed with the patient and family. After consideration of risks, benefits and other options for treatment, the patient has consented to  Procedure(s): COLONOSCOPY WITH PROPOFOL (N/A) as a surgical intervention .  The patient's history has been reviewed, patient examined, no change in status, stable for surgery.  I have reviewed the patient's chart and labs.  Questions were answered to the patient's satisfaction.     Spur C.

## 2016-11-08 NOTE — H&P (View-Only) (Signed)
Referring Provider: Dr. Dyann Kief Primary Care Physician:  Karis Juba, PA-C Primary Gastroenterologist:  Althia Forts  Reason for Consultation:  GI bleed; Anemia  HPI: Barry Mccoy is a 66 y.o. male with history of alcohol abuse (reports quitting in Spring 2017), chronic kidney disease and anemia who had a GI bleed last month and an EGD by Dr. Benson Norway showed portal gastropathy vs gastritis and no active bleeding. He reports having a colonoscopy in [place] last April and says some polyps were removed (his daughter confirms). Nonbloody vomitus this week X 1. Has been having black stools 1-2 times per day but had several episodes of black stools yesterday. Denies hematochezia. Denies abdominal pain. Denies dizziness or lightheadedness. Hgb 7.6 on admit yesterday (10.2 on 10/26/16). Hemodynamically stable. Has been on NSAIDs chronically until last month's hospitalization. Adamantly denies any alcohol since early last year. Has never had a capsule endoscopy. Brother and mother had colon cancer.  Past Medical History:  Diagnosis Date  . Anxiety   . BPH (benign prostatic hyperplasia)   . Cervical spine disease   . Chronic kidney disease    kidney stones  . Depression   . Disorder of lumbar spine   . ETOH abuse   . GI bleed   . Muscle spasm     Past Surgical History:  Procedure Laterality Date  . ELBOW SURGERY     as child  . ELBOW SURGERY    . ESOPHAGOGASTRODUODENOSCOPY N/A 10/04/2016   Procedure: ESOPHAGOGASTRODUODENOSCOPY (EGD);  Surgeon: Carol Ada, MD;  Location: [facility];  Service: Gastroenterology;  Laterality: N/A;    Prior to Admission medications   Medication Sig Start Date End Date Taking? Authorizing Provider  acetaminophen (TYLENOL) 325 MG tablet Take 2 tablets (650 mg total) by mouth every 6 (six) hours as needed for mild pain, moderate pain or headache (or Fever >/= 101). 10/27/16  Yes Clanford Marisa Hua, MD  aspirin EC 81 MG tablet Take 81 mg by mouth every morning.    Yes  Michel Santee, MD  baclofen (LIORESAL) 20 MG tablet TAKE 1 TABLET BY MOUTH EVERY 12 HOURS AS NEEDED FOR SPASMS 10/30/16  Yes [name], PA-C  Cholecalciferol (VITAMIN D-3) 5000 units TABS Take 5,000 Units by mouth at bedtime.   Yes Historical Provider, MD  folic acid (FOLVITE) 1 MG tablet Take 1 tablet (1 mg total) by mouth daily. Patient taking differently: Take 1 mg by mouth every morning.  10/28/16  Yes Clanford Marisa Hua, MD  lidocaine (LIDODERM) 5 % Place 1 patch onto the skin every 12 (twelve) hours as needed (pain).  09/12/16  Yes Historical Provider, MD  metoprolol tartrate (LOPRESSOR) 25 MG tablet Take 1 tablet (25 mg total) by mouth 2 (two) times daily. 10/30/16  Yes [name], PA-C  Multiple Vitamin (MULTIVITAMIN WITH MINERALS) TABS tablet Take 1 tablet by mouth at bedtime.   Yes Historical Provider, MD  nicotine (NICODERM CQ - DOSED IN MG/24 HOURS) 14 mg/24hr patch Place 1 patch (14 mg total) onto the skin daily. 10/20/16  Yes Domenic Polite, MD  OnabotulinumtoxinA (BOTOX IJ) Inject as directed See admin instructions. Botox injections done at Dr. Audery Amel office approximately every 90 days   Yes Historical Provider, MD  polyethylene glycol (MIRALAX / GLYCOLAX) packet Take 17 g by mouth daily as needed for mild constipation. 10/27/16  Yes Clanford Marisa Hua, MD  QUEtiapine (SEROQUEL) 50 MG tablet Take 1 tablet (50 mg total) by mouth at bedtime. 10/30/16  Yes [name],  PA-C  sulfamethoxazole-trimethoprim (BACTRIM DS,SEPTRA DS) 800-160 MG tablet Take 1 tablet by mouth every 12 (twelve) hours.   Yes Historical Provider, MD  tamsulosin (FLOMAX) 0.4 MG CAPS capsule Take 1 capsule (0.4 mg total) by mouth at bedtime. 10/30/16  Yes [name], PA-C  thiamine 100 MG tablet Take 1 tablet (100 mg total) by mouth daily. For 4days then down to 100mg  daily Patient taking differently: Take 250 mg by mouth daily. For 4days then down to 100mg  daily 10/27/16  Yes Clanford Marisa Hua, MD  traZODone (DESYREL)  100 MG tablet Take 1 tablet (100 mg total) by mouth at bedtime. 10/27/16  Yes Clanford Marisa Hua, MD  pantoprazole (PROTONIX) 40 MG tablet Take 1 tablet (40 mg total) by mouth daily. Patient not taking: Reported on 11/06/2016 10/30/16   [name], PA-C  sertraline (ZOLOFT) 50 MG tablet Take 1 tablet (50 mg total) by mouth at bedtime. Patient not taking: Reported on 11/05/2016 10/30/16   [name], PA-C    Scheduled Meds: . folic acid  1 mg Oral Daily  . metoprolol tartrate  12.5 mg Oral BID  . multivitamin with minerals  1 tablet Oral QHS  . nicotine  21 mg Transdermal Once  . pantoprazole (PROTONIX) IV  40 mg Intravenous Q12H  . [START ON 11/07/2016] polyethylene glycol-electrolytes  4,000 mL Oral Once  . QUEtiapine  50 mg Oral QHS  . tamsulosin  0.4 mg Oral QHS  . thiamine  100 mg Oral Daily   Or  . thiamine  100 mg Intravenous Daily  . traZODone  100 mg Oral QHS   Continuous Infusions: . sodium chloride 50 mL/hr at 11/06/16 0801  . sodium chloride     PRN Meds:.acetaminophen **OR** acetaminophen, albuterol, baclofen, LORazepam **OR** LORazepam, ondansetron **OR** ondansetron (ZOFRAN) IV  Allergies as of 11/05/2016 - Review Complete 11/05/2016  Allergen Reaction Noted  . Shellfish allergy Other (See Comments) 10/04/2016    Family History  Problem Relation Age of Onset  . Colon cancer Brother   . Alcohol abuse Brother   . Cancer Brother   . Early death Brother   . Prostate cancer Brother   . Colon polyps Brother   . Cancer Mother 55    colon cancer  . Colon cancer Mother     Social History   Social History  . Marital status: Widowed    Spouse name: N/A  . Number of children: N/A  . Years of education: N/A   Occupational History  . Not on file.   Social History Main Topics  . Smoking status: Former Smoker    Packs/day: 1.00    Years: 50.00    Types: Cigarettes    Quit date: 10/23/2016  . Smokeless tobacco: Never Used     Comment: attempting to quit- down  to 8 a day   . Alcohol use No     Comment: 2 drinks a month  . Drug use: No  . Sexual activity: Yes   Other Topics Concern  . Not on file   Social History Narrative   ** Merged History Encounter **        Review of Systems: All negative except as stated above in HPI.  Physical Exam: Vital signs: Vitals:   11/06/16 0738 11/06/16 1335  BP: 115/75 (!) 102/57  Pulse: 70 69  Resp: 12 16  Temp: 98.2 F (36.8 C) 98.1 F (36.7 C)   Last BM Date: 11/05/16 General:   Alert,  Well-developed, well-nourished, pleasant and cooperative in NAD HEENT: anicteric sclera, oropharynx clear Lungs:  Clear throughout to auscultation.   No wheezes, crackles, or rhonchi. No acute distress. Heart:  Regular rate and rhythm; no murmurs, clicks, rubs,  or gallops. Abdomen: left-sided tenderness with guarding, soft, nondistended, +BS Rectal:  Deferred Ext: no edema  GI:  Lab Results:  Recent Labs  11/05/16 1123 11/05/16 2250 11/06/16 1102  WBC 5.2 6.4 4.8  HGB 7.6* 7.1* 8.1*  HCT 25.4* 22.6* 26.0*  PLT 177 188 134*   BMET  Recent Labs  11/05/16 1123 11/05/16 2250 11/06/16 1102  NA 141 135 137  K 4.2 4.0 3.8  CL 105 103 108  CO2 28 26 25   GLUCOSE 137* 93 105*  BUN 8 10 11   CREATININE 1.06 1.10 0.94  CALCIUM 9.1 9.1 8.4*   LFT  Recent Labs  11/05/16 1123  PROT 5.8*  ALBUMIN 3.6  AST 14  ALT 14  ALKPHOS 60  BILITOT 0.3   PT/INR  Recent Labs  11/05/16 2250  LABPROT 12.6  INR 0.95     Studies/Results: Dg Chest Portable 1 View  Result Date: 11/05/2016 CLINICAL DATA:  Acute onset of decreased hemoglobin. GI bleeding. Generalized weakness. Initial encounter. EXAM: PORTABLE CHEST 1 VIEW COMPARISON:  CT of the thoracic spine performed 07/21/2012 FINDINGS: The lungs are well-aerated. Vascular congestion is noted. Peribronchial thickening is seen. There is no evidence of pleural effusion or pneumothorax. The cardiomediastinal silhouette is within normal limits. No acute  osseous abnormalities are seen. IMPRESSION: Vascular congestion.  Peribronchial thickening seen. Electronically Signed   By: Garald Balding M.D.   On: 11/05/2016 23:45    Impression/Plan: 66yo with melena and worsening anemia with an EGD last month by Dr. Benson Norway that was unrevealing for a source. Will do an updated colonoscopy to further evaluate and if that is unrevealing will need a capsule endoscopy to further evaluate. Full liquids for dinner and then clear liquids tomorrow. Colon prep tomorrow. Colonoscopy scheduled for 11/08/16 at 11 AM.     LOS: 0 days   [place]beria C.  11/06/2016, 3:06 PM  Pager [phone]  If no answer or after 5 PM call [phone]

## 2016-11-08 NOTE — Brief Op Note (Signed)
No source of bleeding seen. Diverticulosis and internal hemorrhoids noted. Capsule endoscopy to be placed tomorrow morning. Full liquid diet. NPO p MN.

## 2016-11-08 NOTE — Op Note (Signed)
[facility] Patient Name: Barry Mccoy Procedure Date: 11/08/2016 MRN: [id number] Attending MD: Lear Ng , MD Date of Birth: 12/02/1950 CSN: [id number] Age: 66 Admit Type: Inpatient Procedure:                Colonoscopy Indications:              Evaluation of unexplained GI bleeding, Last                            colonoscopy: April 2017, Melena Providers:                Lear Ng, MD, Laverta Baltimore RN, RN,                            Cherylynn Ridges, Technician, Virgia Land, CRNA Referring MD:              Medicines:                Propofol per Anesthesia, Monitored Anesthesia Care Complications:            No immediate complications. Estimated Blood Loss:     Estimated blood loss: none. Procedure:                Pre-Anesthesia Assessment:                           - Prior to the procedure, a History and Physical                            was performed, and patient medications and                            allergies were reviewed. The patient's tolerance of                            previous anesthesia was also reviewed. The risks                            and benefits of the procedure and the sedation                            options and risks were discussed with the patient.                            All questions were answered, and informed consent                            was obtained. Prior Anticoagulants: The patient has                            taken no previous anticoagulant or antiplatelet                            agents. ASA Grade Assessment: II - A patient with  mild systemic disease. After reviewing the risks                            and benefits, the patient was deemed in                            satisfactory condition to undergo the procedure.                           After obtaining informed consent, the colonoscope                            was passed under direct vision. Throughout the                            procedure, the patient's blood pressure, pulse, and                            oxygen saturations were monitored continuously. The                            [id number] scope was introduced through                            the anus and advanced to the the cecum, identified                            by appendiceal orifice and ileocecal valve. The                            colonoscopy was somewhat difficult due to                            significant looping, a tortuous colon and fair                            prep. Successful completion of the procedure was                            aided by straightening and shortening the scope to                            obtain bowel loop reduction and lavage. The patient                            tolerated the procedure well. The quality of the                            bowel preparation was fair. The terminal ileum, the                            appendiceal orifice and the rectum were  photographed. Findings:      The perianal and digital rectal examinations were normal.      Internal hemorrhoids were found during retroflexion. The hemorrhoids       were medium-sized and Grade I (internal hemorrhoids that do not       prolapse).      Scattered small-mouthed diverticula were found in the sigmoid colon. Impression:               - Preparation of the colon was fair.                           - Internal hemorrhoids.                           - Diverticulosis in the sigmoid colon.                           - No specimens collected. Moderate Sedation:      N/A- Per Anesthesia Care Recommendation:           - To visualize the small bowel, perform video                            capsule endoscopy tomorrow.                           - Full liquid diet.                           - Repeat colonoscopy in 5 years for surveillance.                           - Post procedure medication  orders were given. Procedure Code(s):        --- Professional ---                           [id number], Colonoscopy, flexible; diagnostic, including                            collection of specimen(s) by brushing or washing,                            when performed (separate procedure) Diagnosis Code(s):        --- Professional ---                           K92.2, Gastrointestinal hemorrhage, unspecified                           K92.1, Melena (includes Hematochezia)                           K64.0, First degree hemorrhoids                           K57.30, Diverticulosis of large intestine without  perforation or abscess without bleeding CPT copyright 2016 [employer]. All rights reserved. The codes documented in this report are preliminary and upon coder review may  be revised to meet current compliance requirements. Lear Ng, MD 11/08/2016 11:51:25 AM This report has been signed electronically. Number of Addenda: 0

## 2016-11-08 NOTE — Anesthesia Preprocedure Evaluation (Signed)
Anesthesia Evaluation  Patient identified by MRN, date of birth, ID band Patient awake    Reviewed: Allergy & Precautions, NPO status , Patient's Chart, lab work & pertinent test results  Airway Mallampati: II  TM Distance: >3 FB Neck ROM: Full    Dental no notable dental hx.    Pulmonary former smoker,    Pulmonary exam normal breath sounds clear to auscultation       Cardiovascular negative cardio ROS Normal cardiovascular exam Rhythm:Regular Rate:Normal     Neuro/Psych negative neurological ROS  negative psych ROS   GI/Hepatic negative GI ROS, Neg liver ROS,   Endo/Other  negative endocrine ROS  Renal/GU negative Renal ROS  negative genitourinary   Musculoskeletal negative musculoskeletal ROS (+)   Abdominal   Peds negative pediatric ROS (+)  Hematology negative hematology ROS (+)   Anesthesia Other Findings   Reproductive/Obstetrics negative OB ROS                             Anesthesia Physical Anesthesia Plan  ASA: II  Anesthesia Plan: MAC   Post-op Pain Management:    Induction: Intravenous  Airway Management Planned: Simple Face Mask  Additional Equipment:   Intra-op Plan:   Post-operative Plan:   Informed Consent: I have reviewed the patients History and Physical, chart, labs and discussed the procedure including the risks, benefits and alternatives for the proposed anesthesia with the patient or authorized representative who has indicated his/her understanding and acceptance.   Dental advisory given  Plan Discussed with: CRNA  Anesthesia Plan Comments:         Anesthesia Quick Evaluation

## 2016-11-08 NOTE — Progress Notes (Signed)
[facility] PROGRESS NOTE    Progress Note  Barry Mccoy  [id number] DOB: Dec 06, 1950 DOA: 11/05/2016 PCP: Karis Juba, PA-C     Brief Narrative:   Barry Mccoy is an 66 y.o. male with medical history significant of alcohol abuse, anxiety, depression, and CKD stage II; who presents after being instructed to come in for low blood counts. Previously admitted from 1/22 through 2/13 for acute GI bleed with symptomatic anemia. During hospitalization patient was transfused 5 units of PRBC and required intubation thought secondary to acute respiratory failure with hypoxia associated with delirium from alcohol withdrawal. He also underwent a EGDon 1/25 by Dr. Benson Norway, but no active source of bleeding was noted.   Assessment/Plan:   Acute GI bleed with   Acute blood loss anemia: 10 days prior to admission his hemoglobin was 10.2, on admission 7.1.  History status post 2 units of packed red blood cells. For colonoscopy today Resume diet after colonoscopy per gi.  BPH (benign prostatic hyperplasia)  Nausea with vomiting: Resolved.  Alcohol abuse: Continue to monitor with CIWA. No signs of withdrawals.  DVT prophylaxis: SCD Family Communication:none Disposition Plan/Barrier to D/C: home hopefully today Code Status:     Code Status Orders        Start     Ordered   11/06/16 0202  Full code  Continuous     11/06/16 0203    Code Status History    Date Active Date Inactive Code Status Order ID Comments User Context   10/23/2016 10:20 PM 10/27/2016  6:36 PM Full Code [id number]  Vianne Bulls, MD ED   10/01/2016 11:36 PM 10/23/2016 12:21 PM Full Code [id number]  Rise Patience, MD ED        IV Access:    Peripheral IV   Procedures and diagnostic studies:   No results found.   Medical Consultants:    None.  Anti-Infectives:   None  Subjective:    Barry Mccoy no complains  Objective:    Vitals:   11/07/16 0500 11/07/16 1500 11/07/16 2114 11/08/16  0534  BP: 131/60 117/60 105/61 (!) 106/51  Pulse: 76 61 66 69  Resp: 18 18 18 18   Temp: 97.9 F (36.6 C) 98 F (36.7 C) 98 F (36.7 C) 98.2 F (36.8 C)  TempSrc: Oral Oral Oral Oral  SpO2: 100% 100% 100% 100%  Weight: 96.1 kg (211 lb 13.8 oz)     Height:        Intake/Output Summary (Last 24 hours) at 11/08/16 0938 Last data filed at 11/08/16 0600  Gross per 24 hour  Intake           390.67 ml  Output              700 ml  Net          -309.33 ml   Filed Weights   11/06/16 0349 11/07/16 0500  Weight: 97.4 kg (214 lb 12.8 oz) 96.1 kg (211 lb 13.8 oz)    Exam: General exam: In no acute distress. Respiratory system: Good air movement and clear to auscultation. Cardiovascular system: S1 & S2 heard, RRR.  Gastrointestinal system: Abdomen is nondistended, soft and nontender.  Extremities: No pedal edema. Skin: No rashes, lesions or ulcers   Data Reviewed:    Labs: Basic Metabolic Panel:  Recent Labs Lab 11/05/16 1123 11/05/16 2250 11/06/16 1102  NA 141 135 137  K 4.2 4.0 3.8  CL 105 103 108  CO2 28 26 25  GLUCOSE 137* 93 105*  BUN 8 10 11   CREATININE 1.06 1.10 0.94  CALCIUM 9.1 9.1 8.4*  MG 2.0  --   --    GFR Estimated Creatinine Clearance: 88.7 mL/min (by C-G formula based on SCr of 0.94 mg/dL). Liver Function Tests:  Recent Labs Lab 11/05/16 1123  AST 14  ALT 14  ALKPHOS 60  BILITOT 0.3  PROT 5.8*  ALBUMIN 3.6   No results for input(s): LIPASE, AMYLASE in the last 168 hours.  Recent Labs Lab 11/05/16 2332  AMMONIA 23   Coagulation profile  Recent Labs Lab 11/05/16 2250  INR 0.95    CBC:  Recent Labs Lab 11/05/16 1123 11/05/16 2250 11/06/16 1102 11/07/16 0506  WBC 5.2 6.4 4.8 5.4  NEUTROABS 2,392  --   --   --   HGB 7.6* 7.1* 8.1* 8.0*  HCT 25.4* 22.6* 26.0* 25.4*  MCV 82.2 79.3 80.5 82.2  PLT 177 188 134* 128*   Cardiac Enzymes: No results for input(s): CKTOTAL, CKMB, CKMBINDEX, TROPONINI in the last 168 hours. BNP  (last 3 results) No results for input(s): PROBNP in the last 8760 hours. CBG:  Recent Labs Lab 11/05/16 2328  GLUCAP 89   D-Dimer: No results for input(s): DDIMER in the last 72 hours. Hgb A1c: No results for input(s): HGBA1C in the last 72 hours. Lipid Profile: No results for input(s): CHOL, HDL, LDLCALC, TRIG, CHOLHDL, LDLDIRECT in the last 72 hours. Thyroid function studies: No results for input(s): TSH, T4TOTAL, T3FREE, THYROIDAB in the last 72 hours.  Invalid input(s): FREET3 Anemia work up: No results for input(s): VITAMINB12, FOLATE, FERRITIN, TIBC, IRON, RETICCTPCT in the last 72 hours. Sepsis Labs:  Recent Labs Lab 11/05/16 1123 11/05/16 2250 11/06/16 1102 11/07/16 0506  WBC 5.2 6.4 4.8 5.4   Microbiology Recent Results (from the past 240 hour(s))  Urine culture     Status: None   Collection Time: 11/05/16 11:23 AM  Result Value Ref Range Status   Organism ID, Bacteria NO GROWTH  Final     Medications:   . folic acid  1 mg Oral Daily  . metoprolol tartrate  12.5 mg Oral BID  . multivitamin with minerals  1 tablet Oral QHS  . pantoprazole  40 mg Oral BID  . QUEtiapine  50 mg Oral QHS  . tamsulosin  0.4 mg Oral QHS  . thiamine  100 mg Oral Daily   Or  . thiamine  100 mg Intravenous Daily  . traZODone  100 mg Oral QHS   Continuous Infusions: . sodium chloride 10 mL/hr at 11/07/16 0921  . sodium chloride 10 mL/hr at 11/07/16 2307    Time spent: 25 min   LOS: 2 days   Charlynne Cousins  [facility] Pager [phone]  *Please refer to [email or web address], password [email or web address] to get updated schedule on who will round on this patient, as hospitalists switch teams weekly. If 7PM-7AM, please contact night-coverage at [email or web address], password [email or web address] for any overnight needs.  11/08/2016, 9:38 AM

## 2016-11-08 NOTE — Transfer of Care (Signed)
Immediate Anesthesia Transfer of Care Note  Patient: Barry Mccoy  Procedure(s) Performed: Procedure(s): COLONOSCOPY WITH PROPOFOL (N/A)  Patient Location: PACU  Anesthesia Type:MAC  Level of Consciousness:  sedated, patient cooperative and responds to stimulation  Airway & Oxygen Therapy:Patient Spontanous Breathing and Patient connected to face mask oxgen  Post-op Assessment:  Report given to PACU RN and Post -op Vital signs reviewed and stable  Post vital signs:  Reviewed and stable  Last Vitals:  Vitals:   11/08/16 0534 11/08/16 1020  BP: (!) 106/51 110/61  Pulse: 69 73  Resp: 18 17  Temp: 36.8 C 53.7 C    Complications: No apparent anesthesia complications

## 2016-11-09 ENCOUNTER — Encounter (HOSPITAL_COMMUNITY)
Admission: EM | Disposition: A | Discharge status: Home / Self Care | Payer: Self-pay | Source: Home / Self Care | Attending: Internal Medicine

## 2016-11-09 DIAGNOSIS — N400 Enlarged prostate without lower urinary tract symptoms: Secondary | ICD-10-CM

## 2016-11-09 HISTORY — PX: GIVENS CAPSULE STUDY: SHX5432

## 2016-11-09 SURGERY — IMAGING PROCEDURE, GI TRACT, INTRALUMINAL, VIA CAPSULE
Anesthesia: LOCAL

## 2016-11-09 SURGICAL SUPPLY — 1 items: TOWEL COTTON PACK 4EA (MISCELLANEOUS) ×4 IMPLANT

## 2016-11-09 NOTE — Progress Notes (Signed)
Chaplain stopped by to see patient while rounding the floor.  Patient is sitting up in chair.  Patient expresses being bored and ready to go home.  Patient shares several jokes with the Chaplain, something I believe he enjoys doing to take his mind off of his medical situation. 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 Patient talked about the camera they placed inside of him to get photos to try to discover what's going on.  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  He talked about retiring after his wife passed away.  Patient expresses a little grief when talking about his wife who passed away 01/07/2016.  They were married 84 years and he says he was supposed to go first.  He says his hospital room is lonely without her but that his daughter would be visiting later.    Before leaving the room, the patient reached out to shake Chaplain's hand and asked for prayer.  Stating he would also say a prayer for Chaplain.  Chaplain prayed with patient while with patient before leaving the room.  Patient appreciative of Chaplain presence and care he is receiving at hospital.    11/09/16 1033  Clinical Encounter Type  Visited With Patient  Visit Type Initial  Spiritual Encounters  Spiritual Needs Prayer   Dorna Bloom Resident

## 2016-11-09 NOTE — Care Management Note (Signed)
Case Management Note  Patient Details  Name: Barry Mccoy MRN: [id number] Date of Birth: 1951/03/18  Subjective/Objective: For d/c in am if medically stable. Patient states he has no need for HHC. No further CM needs.                   Action/Plan:d/c home.   Expected Discharge Date:                  Expected Discharge Plan:  Home/Self Care  In-House Referral:     Discharge planning Services  CM Consult  Post Acute Care Choice:    Choice offered to:     DME Arranged:    DME Agency:     HH Arranged:    [place] Agency:     Status of Service:  Completed, signed off  If discussed at [employer] of Stay Meetings, dates discussed:    Additional Comments:  Dessa Phi, RN 11/09/2016, 11:20 AM

## 2016-11-09 NOTE — Progress Notes (Addendum)
[facility] PROGRESS NOTE    Progress Note  Barry Mccoy  [id number] DOB: 30-Jan-1951 DOA: 11/05/2016 PCP: Karis Juba, PA-C     Brief Narrative:   Barry Mccoy is an 66 y.o. male with medical history significant of alcohol abuse, anxiety, depression, and CKD stage II; who presents after being instructed to come in for low blood counts. Previously admitted from 1/22 through 2/13 for acute GI bleed with symptomatic anemia. During hospitalization patient was transfused 5 units of PRBC and required intubation thought secondary to acute respiratory failure with hypoxia associated with delirium from alcohol withdrawal. He also underwent a EGDon 1/25 by Dr. Benson Norway, but no active source of bleeding was noted.   Assessment/Plan:   Acute GI bleed with   Acute blood loss anemia: 10 days prior to admission his hemoglobin was 10.2, on admission 7.1.  History status post 2 units of packed red blood cells. Colonoscopy did not show any source of bleeding for Capsule endoscopy Today. GI to comments on when to start ASA.  BPH (benign prostatic hyperplasia)  Nausea with vomiting: Resolved.  Alcohol abuse: Continue to monitor with CIWA. No signs of withdrawals.  DVT prophylaxis: SCD Family Communication:none Disposition Plan/Barrier to D/C: home hopefully today Code Status:     Code Status Orders        Start     Ordered   11/06/16 0202  Full code  Continuous     11/06/16 0203    Code Status History    Date Active Date Inactive Code Status Order ID Comments User Context   10/23/2016 10:20 PM 10/27/2016  6:36 PM Full Code [id number]  Vianne Bulls, MD ED   10/01/2016 11:36 PM 10/23/2016 12:21 PM Full Code [id number]  Rise Patience, MD ED        IV Access:    Peripheral IV   Procedures and diagnostic studies:   No results found.   Medical Consultants:    None.  Anti-Infectives:   None  Subjective:    Barry Mccoy no complains  Objective:    Vitals:   11/08/16 1400 11/08/16 2122 11/08/16 2130 11/09/16 0553  BP: (!) 105/41 (!) 101/47 (!) 115/56 110/68  Pulse: 66 67 72 77  Resp: 14   12  Temp: 97.5 F (36.4 C) 97.7 F (36.5 C)  98.2 F (36.8 C)  TempSrc: Oral Oral  Oral  SpO2: 100% 100%  100%  Weight:      Height:        Intake/Output Summary (Last 24 hours) at 11/09/16 0806 Last data filed at 11/09/16 0554  Gross per 24 hour  Intake              960 ml  Output             2600 ml  Net            -1640 ml   Filed Weights   11/06/16 0349 11/07/16 0500  Weight: 97.4 kg (214 lb 12.8 oz) 96.1 kg (211 lb 13.8 oz)    Exam: General exam: In no acute distress. Respiratory system: Good air movement and clear to auscultation. Cardiovascular system: S1 & S2 heard, RRR.  Gastrointestinal system: Abdomen is nondistended, soft and nontender.  Extremities: No pedal edema. Skin: No rashes, lesions or ulcers   Data Reviewed:    Labs: Basic Metabolic Panel:  Recent Labs Lab 11/05/16 1123 11/05/16 2250 11/06/16 1102  NA 141 135 137  K 4.2 4.0 3.8  CL 105  103 108  CO2 28 26 25   GLUCOSE 137* 93 105*  BUN 8 10 11   CREATININE 1.06 1.10 0.94  CALCIUM 9.1 9.1 8.4*  MG 2.0  --   --    GFR Estimated Creatinine Clearance: 88.7 mL/min (by C-G formula based on SCr of 0.94 mg/dL). Liver Function Tests:  Recent Labs Lab 11/05/16 1123  AST 14  ALT 14  ALKPHOS 60  BILITOT 0.3  PROT 5.8*  ALBUMIN 3.6   No results for input(s): LIPASE, AMYLASE in the last 168 hours.  Recent Labs Lab 11/05/16 2332  AMMONIA 23   Coagulation profile  Recent Labs Lab 11/05/16 2250  INR 0.95    CBC:  Recent Labs Lab 11/05/16 1123 11/05/16 2250 11/06/16 1102 11/07/16 0506  WBC 5.2 6.4 4.8 5.4  NEUTROABS 2,392  --   --   --   HGB 7.6* 7.1* 8.1* 8.0*  HCT 25.4* 22.6* 26.0* 25.4*  MCV 82.2 79.3 80.5 82.2  PLT 177 188 134* 128*   Cardiac Enzymes: No results for input(s): CKTOTAL, CKMB, CKMBINDEX, TROPONINI in the last 168  hours. BNP (last 3 results) No results for input(s): PROBNP in the last 8760 hours. CBG:  Recent Labs Lab 11/05/16 2328  GLUCAP 89   D-Dimer: No results for input(s): DDIMER in the last 72 hours. Hgb A1c: No results for input(s): HGBA1C in the last 72 hours. Lipid Profile: No results for input(s): CHOL, HDL, LDLCALC, TRIG, CHOLHDL, LDLDIRECT in the last 72 hours. Thyroid function studies: No results for input(s): TSH, T4TOTAL, T3FREE, THYROIDAB in the last 72 hours.  Invalid input(s): FREET3 Anemia work up: No results for input(s): VITAMINB12, FOLATE, FERRITIN, TIBC, IRON, RETICCTPCT in the last 72 hours. Sepsis Labs:  Recent Labs Lab 11/05/16 1123 11/05/16 2250 11/06/16 1102 11/07/16 0506  WBC 5.2 6.4 4.8 5.4   Microbiology Recent Results (from the past 240 hour(s))  Urine culture     Status: None   Collection Time: 11/05/16 11:23 AM  Result Value Ref Range Status   Organism ID, Bacteria NO GROWTH  Final     Medications:   . folic acid  1 mg Oral Daily  . metoprolol tartrate  12.5 mg Oral BID  . multivitamin with minerals  1 tablet Oral QHS  . pantoprazole  40 mg Oral BID  . QUEtiapine  50 mg Oral QHS  . tamsulosin  0.4 mg Oral QHS  . thiamine  100 mg Oral Daily   Or  . thiamine  100 mg Intravenous Daily  . traZODone  100 mg Oral QHS   Continuous Infusions: . sodium chloride 10 mL/hr at 11/07/16 0921  . sodium chloride 10 mL/hr at 11/07/16 2307    Time spent: 15 min   LOS: 3 days   Charlynne Cousins  [facility] Pager [phone]  *Please refer to [email or web address], password [email or web address] to get updated schedule on who will round on this patient, as hospitalists switch teams weekly. If 7PM-7AM, please contact night-coverage at [email or web address], password [email or web address] for any overnight needs.  11/09/2016, 8:06 AM

## 2016-11-10 MED ORDER — ASPIRIN EC 81 MG PO TBEC: 81.0000 mg | DELAYED_RELEASE_TABLET | ORAL | Status: DC

## 2016-11-10 NOTE — Discharge Summary (Signed)
Physician Discharge Summary  Barry Mccoy [id number] DOB: 1951/06/19 DOA: 11/05/2016  PCP: Karis Juba, PA-C  Admit date: 11/05/2016 Discharge date: 11/10/2016  Admitted From: Home Disposition:  Home  Recommendations for Outpatient Follow-up:  1. Follow up with Gastroenterology in 1-2 weeks 2. Please obtain CBC in one week, follow up on biopsy results. 3. Please follow up on the following pending results:  Home Health:NO Equipment/Devices:None  Discharge Condition:stable CODE STATUS:Full Diet recommendation: Heart Healthy   Brief/Interim Summary: 66 y.o. male with medical history significant of alcohol abuse, anxiety, depression, and CKD stage II; who presents after being instructed to come in for low blood counts. Previously admitted from 1/22 through 2/13 for acute GI bleed withsymptomatic anemia. During hospitalization patient was transfused 5 units of PRBC and required intubation thought secondary to acute respiratory failure with hypoxia associated with delirium from alcohol withdrawal.   Discharge Diagnoses:  Principal Problem:   GI bleed Active Problems:   BPH (benign prostatic hyperplasia)   Acute blood loss anemia   Nausea with vomiting   Alcohol abuse  Acute GI bleed with acute blood loss: Of  Unclear source tenderness prior to admission his hemoglobin was 10.2, admission was 7.1 he was transfused 2 units of packed red blood cells, his colonoscopy did not show any signs of bleeding. Endoscopy was done as a follow-up as an outpatient.  BPH: No changes are made.  Nausea and vomiting: Resolved.  Alcohol abuse: His monitor and showed no signs of withdrawal.    Discharge Instructions  Discharge Instructions    Diet - low sodium heart healthy    Complete by:  As directed    Increase activity slowly    Complete by:  As directed      Allergies as of 11/10/2016      Reactions   Shellfish Allergy Other (See Comments)   Unknown       Medication List     TAKE these medications   acetaminophen 325 MG tablet Commonly known as:  TYLENOL Take 2 tablets (650 mg total) by mouth every 6 (six) hours as needed for mild pain, moderate pain or headache (or Fever >/= 101).   aspirin EC 81 MG tablet Take 1 tablet (81 mg total) by mouth every morning. Start taking on:  11/13/2016   baclofen 20 MG tablet Commonly known as:  LIORESAL TAKE 1 TABLET BY MOUTH EVERY 12 HOURS AS NEEDED FOR SPASMS   BOTOX IJ Inject as directed See admin instructions. Botox injections done at Dr. Audery Amel office approximately every 90 days   folic acid 1 MG tablet Commonly known as:  FOLVITE Take 1 tablet (1 mg total) by mouth daily. What changed:  when to take this   lidocaine 5 % Commonly known as:  LIDODERM Place 1 patch onto the skin every 12 (twelve) hours as needed (pain).   metoprolol tartrate 25 MG tablet Commonly known as:  LOPRESSOR Take 1 tablet (25 mg total) by mouth 2 (two) times daily.   multivitamin with minerals Tabs tablet Take 1 tablet by mouth at bedtime.   nicotine 14 mg/24hr patch Commonly known as:  NICODERM CQ - dosed in mg/24 hours Place 1 patch (14 mg total) onto the skin daily.   pantoprazole 40 MG tablet Commonly known as:  PROTONIX Take 1 tablet (40 mg total) by mouth daily.   polyethylene glycol packet Commonly known as:  MIRALAX / GLYCOLAX Take 17 g by mouth daily as needed for mild constipation.   QUEtiapine 50 MG  tablet Commonly known as:  SEROQUEL Take 1 tablet (50 mg total) by mouth at bedtime.   sertraline 50 MG tablet Commonly known as:  ZOLOFT Take 1 tablet (50 mg total) by mouth at bedtime.   sulfamethoxazole-trimethoprim 800-160 MG tablet Commonly known as:  BACTRIM DS,SEPTRA DS Take 1 tablet by mouth every 12 (twelve) hours.   tamsulosin 0.4 MG Caps capsule Commonly known as:  FLOMAX Take 1 capsule (0.4 mg total) by mouth at bedtime.   thiamine 100 MG tablet Take 1 tablet (100 mg total) by mouth daily. For  4days then down to 100mg  daily What changed:  how much to take  additional instructions   traZODone 100 MG tablet Commonly known as:  DESYREL Take 1 tablet (100 mg total) by mouth at bedtime.   Vitamin D-3 5000 units Tabs Take 5,000 Units by mouth at bedtime.       Allergies  Allergen Reactions  . Shellfish Allergy Other (See Comments)    Unknown     Consultations:  Gastroenterology   Procedures/Studies: Ct Head Wo Contrast  Result Date: 10/24/2016 CLINICAL DATA:  Anxiety, depression, alcohol abuse, confusion, agitation EXAM: CT HEAD WITHOUT CONTRAST TECHNIQUE: Contiguous axial images were obtained from the base of the skull through the vertex without intravenous contrast. COMPARISON:  Brain MRI 01/22/2014 FINDINGS: Brain: No intracranial hemorrhage, mass effect or midline shift. No acute cortical infarction. Stable cerebral atrophy. Stable periventricular and patchy subcortical chronic white matter disease. Prominent perivascular space or lacunar infarct in right basal ganglia is stable. No mass lesion is noted on this unenhanced scan. Ventricular size is stable from prior exam. Vascular: Mild atherosclerotic calcifications of carotid siphon. Skull: No skull fracture is noted. Sinuses/Orbits: No acute findings. Other: None IMPRESSION: No acute intracranial abnormality. Stable cerebral atrophy. Stable periventricular and patchy subcortical white matter decreased attenuation probable due to chronic small vessel ischemic changes. No definite acute cortical infarction. Stable prominent perivascular space or old lacunar infarct in right basal ganglia. Electronically Signed   By: Lahoma Crocker M.D.   On: 10/24/2016 11:30   Ct Head Wo Contrast  Result Date: 10/14/2016 CLINICAL DATA:  Altered mental status. EXAM: CT HEAD WITHOUT CONTRAST TECHNIQUE: Contiguous axial images were obtained from the base of the skull through the vertex without intravenous contrast. COMPARISON:  10/03/2016  FINDINGS: Brain: No evidence of acute infarction, hemorrhage, hydrocephalus, or mass lesion/mass effect. Small hygroma posterior to the left cerebellum is stable at 4 mm maximal thickness. No significant mass effect. Stable pattern of mild chronic microvascular disease. Dilated perivascular space versus remote lacune along the lower right putamen. Vascular: Atherosclerotic calcification.  No hyperdense vessel. Skull: No acute finding Sinuses/Orbits: Negative IMPRESSION: No acute finding or change from prior. Electronically Signed   By: Monte Fantasia M.D.   On: 10/14/2016 16:04   Ct Angio Chest Pe W Or Wo Contrast  Result Date: 10/14/2016 CLINICAL DATA:  Hypoxia, agitation, SVT, elevated D-dimer. EXAM: CT ANGIOGRAPHY CHEST WITH CONTRAST TECHNIQUE: Multidetector CT imaging of the chest was performed using the standard protocol during bolus administration of intravenous contrast. Multiplanar CT image reconstructions and MIPs were obtained to evaluate the vascular anatomy. CONTRAST:  100 cc Isovue 370 COMPARISON:  None. FINDINGS: Cardiovascular: Beam hardening artifact, presumably from patient's arms, considerably limits characterization of the peripheral segmental and subsegmental pulmonary artery branches to the lower lobes bilaterally. I cannot exclude small peripheral pulmonary embolism. There is no pulmonary embolism identified within the main, lobar or central segmental pulmonary arteries bilaterally. Heart  size is normal. No pericardial effusion. No aortic aneurysm or dissection. Mild atherosclerotic changes noted along the walls of the descending thoracic aorta. Coronary artery calcifications noted. Mediastinum/Nodes: No mass or enlarged lymph nodes within the mediastinum or perihilar regions. Esophagus appears normal. Trachea and central bronchi are unremarkable. Lungs/Pleura: Small right pleural effusion with adjacent compressive atelectasis. Mild atelectasis at the left lung base. Hazy ground-glass  opacities within the upper lobes bilaterally, likely additional atelectasis or perhaps mild edema. No pneumothorax. 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 Upper Abdomen: Free intraperitoneal air. Small amount of free fluid in the left upper quadrant. Mass versus fluid collection adjacent to the pancreatic tail, incompletely imaged. Multiple gallstones within the gallbladder, incompletely imaged. Musculoskeletal: No acute or suspicious osseous lesion. Mild degenerative spurring within the thoracic spine. Superficial soft tissues are unremarkable. Review of the MIP images confirms the above findings. IMPRESSION: 1. Free intraperitoneal air within the upper abdomen. In the absence of a recent surgery, this suggests viscus perforation. CT abdomen and pelvis is recommended for further characterization. 2. No pulmonary embolism seen, with study limitations detailed above. 3. Small right pleural effusion. Mild bibasilar atelectasis. Mild atelectasis versus edema within the upper lobes bilaterally. No evidence of pneumonia. 4. Free fluid in the left upper abdomen. Mass versus fluid collection adjacent to the pancreatic tail, incompletely imaged. Cholelithiasis, incompletely imaged. 5. Aortic atherosclerosis. Critical Value/emergent results were called by telephone at the time of interpretation on 10/14/2016 at 4:25 pm to Dr. Irine Seal , who verbally acknowledged these results. Electronically Signed   By: Franki Cabot M.D.   On: 10/14/2016 16:29   Mr Brain Wo Contrast  Result Date: 10/26/2016 CLINICAL DATA:  66 y/o  M; acute confusion and agitation. EXAM: MRI HEAD WITHOUT CONTRAST TECHNIQUE: Multiplanar, multiecho pulse sequences of the brain and surrounding structures were obtained without intravenous contrast. COMPARISON:  10/24/2016 CT of the head. 01/22/2014 MRI of the brain. FINDINGS: Brain: No acute infarction, hemorrhage, hydrocephalus, extra-axial collection or mass  lesion. Foci of T2 FLAIR hyperintense signal abnormality in subcortical and periventricular white matter is stable and compatible with moderate chronic microvascular ischemic changes. There is stable moderate diffuse brain parenchymal volume loss. Small foci of T2 FLAIR hyperintensity within the pons are compatible with small chronic lacunar infarcts. There are large prominent perivascular spaces in the right lentiform nucleus. Vascular: Normal flow voids. Skull and upper cervical spine: Normal marrow signal. Sinuses/Orbits: No abnormal signal of paranasal sinuses. Underpneumatized frontal sinuses. Small right mastoid effusion. No abnormal signal of left mastoid air cells. Orbits are unremarkable. Other: None. IMPRESSION: 1. No acute intracranial abnormality. 2. Stable moderate chronic microvascular ischemic changes and parenchymal volume loss of the brain. 3. Small right mastoid effusion. Electronically Signed   By: Kristine Garbe M.D.   On: 10/26/2016 19:24   Ct Abdomen Pelvis W Contrast  Addendum Date: 10/14/2016   ADDENDUM REPORT: 10/14/2016 21:30 ADDENDUM: Findings discussed with K. Schorr 10/14/2016  at 9:30 pm. Electronically Signed   By: Monte Fantasia M.D.   On: 10/14/2016 21:30   Result Date: 10/14/2016 CLINICAL DATA:  Follow-up chest CT.  Free air. EXAM: CT ABDOMEN AND PELVIS WITH CONTRAST TECHNIQUE: Multidetector CT imaging of the abdomen and pelvis was performed using the standard protocol following bolus administration of intravenous contrast. CONTRAST:  40mL ISOVUE-300 IOPAMIDOL (ISOVUE-300) INJECTION 61% COMPARISON:  None. FINDINGS: Lower chest: Described separately. Small pleural effusions and atelectasis Hepatobiliary: Low-density along the falciform ligament is likely perfusion anomaly. Cannot  exclude cirrhosis. The liver surface is lobulated. Cholelithiasis. No signs of acute cholecystitis. Pancreas: Cystic density of the pancreatic tail measuring 26 mm. No surrounding inflammatory  changes. There is a coarse calcification and neighboring pancreatic tail which is somewhat full. Some this fullness may be related to venous collaterals. Spleen: Enlarged this 17 cm span. This is likely related to chronic splenic vein occlusion. There are well-formed venous collaterals, including proximal gastric varices. Adrenals/Urinary Tract: Negative adrenals. No hydronephrosis or stone. Urinary bladder predominately decompressed by Foley catheter. Stomach/Bowel: Numerous colonic diverticula. There is question of diverticulitis at the splenic flexure, but no noted gas in this region to explain pneumoperitoneum. No inflammatory changes noted around the stomach or proximal duodenum. No visible ulcer. Proximal gastric varices. Negative appendix. Vascular/Lymphatic: No acute vascular abnormality. Splenic vein occlusion as described. Extensive atherosclerosis. No mass or adenopathy. Reproductive:No pathologic findings. Other: Moderate volume pneumoperitoneum, maximal in the upper ventral abdomen, but also present in the left lower quadrant. Musculoskeletal:  Degenerative changes.  No acute abnormalities. Findings are known based on previous recorded communications. Mid level has been paged to make aware of the completed scan. IMPRESSION: 1. No definitive source for the patient's moderate pneumoperitoneum. Given there is no inflammatory changes or visible ulcer along the stomach or proximal duodenum, a diverticular source is favored. There may be mild diverticulitis along the splenic flexure, but no extraluminal gas in this region. 2. 26 mm fluid collection in the splenic fossa. Suspect pseudocyst as there is ductal or parenchymal calcification in the pancreatic tail. Pancreatic tail is full and pancreas protocol CT or MRI follow-up is recommended after convalescence to exclude mass. 3. 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 Patient was admitted with GI bleeding. 4. Cholelithiasis.  5. Suspected cirrhosis. 6. Splenomegaly. Electronically Signed: By: Monte Fantasia M.D. On: 10/14/2016 21:19   Dg Chest Portable 1 View  Result Date: 11/05/2016 CLINICAL DATA:  Acute onset of decreased hemoglobin. GI bleeding. Generalized weakness. Initial encounter. EXAM: PORTABLE CHEST 1 VIEW COMPARISON:  CT of the thoracic spine performed 07/21/2012 FINDINGS: The lungs are well-aerated. Vascular congestion is noted. Peribronchial thickening is seen. There is no evidence of pleural effusion or pneumothorax. The cardiomediastinal silhouette is within normal limits. No acute osseous abnormalities are seen. IMPRESSION: Vascular congestion.  Peribronchial thickening seen. Electronically Signed   By: Garald Balding M.D.   On: 11/05/2016 23:45   Dg Swallowing Func-speech Pathology  Result Date: 10/20/2016 Objective Swallowing Evaluation: Type of Study: MBS-Modified Barium Swallow Study Patient Details Name: Barry Mccoy MRN: [id number] Date of Birth: [birth year] Today's Date: 10/20/2016 Time: SLP Start Time (ACUTE ONLY): 1110-SLP Stop Time (ACUTE ONLY): 1125 SLP Time Calculation (min) (ACUTE ONLY): 15 min Past Medical History: Past Medical History: Diagnosis Date . Muscle spasm  Past Surgical History: Past Surgical History: Procedure Laterality Date . ELBOW SURGERY   . ESOPHAGOGASTRODUODENOSCOPY N/A 10/04/2016  Procedure: ESOPHAGOGASTRODUODENOSCOPY (EGD);  Surgeon: Carol Ada, MD;  Location: [facility];  Service: Gastroenterology;  Laterality: N/A; HPI: 67 year old male admitted 10/01/16 due to near syncope, AMS, acute encephalopathy and intermittent hypoxia. Pt intubated 1/24-28/18 due to respiratory distress. UGI 10/04/16 - hypertensive gastropathy vs inflammatory gastritis. CXR 10/07/16 - bilateral airspace disease. PMH unremarkable. No Data Recorded Assessment / Plan / Recommendation CHL IP CLINICAL IMPRESSIONS 10/20/2016 Therapy Diagnosis -- Clinical Impression Pt's swallow ability has improved from previous MBS.  Mild sensorimotor (motor>sensory) oral and pharyngeal dysphagia. Laryngeal penetration present with thin, mostly flash although minimal amount remained in vestibule  following large and consecutive sips thin in which penetration occured before the swallow. Delayed oral transit (thin) and prolonged mastication with solid texture. Reduced tongue base retraction and decreased laryngeal elevation led to mild valleculae (mostly) and pyriform sinus residue. Verbal cues for second swallow cleared majority of residue. Smaller cup sips mitigated occurance of penetration. Recommend he continue with Dys 2 texture however upgrade liquids to thin, small sips, straws allowed and swallow twice after bites/sips.    Impact on safety and function --   CHL IP TREATMENT RECOMMENDATION 10/19/2016 Treatment Recommendations Therapy as outlined in treatment plan below   Prognosis 10/19/2016 Prognosis for Safe Diet Advancement Good Barriers to Reach Goals Cognitive deficits Barriers/Prognosis Comment -- CHL IP DIET RECOMMENDATION 10/19/2016 SLP Diet Recommendations Dysphagia 2 (Fine chop) solids;Thin liquid Liquid Administration via Cup;No straw Medication Administration Whole meds with puree Compensations Slow rate;Minimize environmental distractions;Small sips/bites Postural Changes Seated upright at 90 degrees   CHL IP OTHER RECOMMENDATIONS 10/19/2016 Recommended Consults -- Oral Care Recommendations Oral care BID Other Recommendations --   CHL IP FOLLOW UP RECOMMENDATIONS 10/19/2016 Follow up Recommendations Other (comment)   CHL IP FREQUENCY AND DURATION 10/19/2016 Speech Therapy Frequency (ACUTE ONLY) min 2x/week Treatment Duration 2 weeks      CHL IP ORAL PHASE 10/19/2016 Oral Phase Impaired Oral - Pudding Teaspoon -- Oral - Pudding Cup -- Oral - Honey Teaspoon -- Oral - Honey Cup -- Oral - Nectar Teaspoon -- Oral - Nectar Cup -- Oral - Nectar Straw NT Oral - Thin Teaspoon -- Oral - Thin Cup WFL Oral - Thin Straw WFL Oral - Puree NT Oral - Mech  Soft NT Oral - Regular Lingual/palatal residue Oral - Multi-Consistency -- Oral - Pill -- Oral Phase - Comment --  CHL IP PHARYNGEAL PHASE 10/19/2016 Pharyngeal Phase Impaired Pharyngeal- Pudding Teaspoon -- Pharyngeal -- Pharyngeal- Pudding Cup -- Pharyngeal -- Pharyngeal- Honey Teaspoon -- Pharyngeal -- Pharyngeal- Honey Cup -- Pharyngeal -- Pharyngeal- Nectar Teaspoon -- Pharyngeal -- Pharyngeal- Nectar Cup NT Pharyngeal -- Pharyngeal- Nectar Straw NT Pharyngeal -- Pharyngeal- Thin Teaspoon -- Pharyngeal -- Pharyngeal- Thin Cup Penetration/Aspiration during swallow;Penetration/Aspiration before swallow;Pharyngeal residue - valleculae;Pharyngeal residue - pyriform;Reduced laryngeal elevation;Reduced tongue base retraction;Delayed swallow initiation-pyriform sinuses Pharyngeal Material enters airway, remains ABOVE vocal cords then ejected out;Material enters airway, remains ABOVE vocal cords and not ejected out Pharyngeal- Thin Straw Delayed swallow initiation-pyriform sinuses;Pharyngeal residue - valleculae;Pharyngeal residue - pyriform;Reduced tongue base retraction;Reduced laryngeal elevation Pharyngeal Material does not enter airway Pharyngeal- Puree NT Pharyngeal -- Pharyngeal- Mechanical Soft NT Pharyngeal -- Pharyngeal- Regular WFL Pharyngeal -- Pharyngeal- Multi-consistency -- Pharyngeal -- Pharyngeal- Pill -- Pharyngeal -- Pharyngeal Comment --  CHL IP CERVICAL ESOPHAGEAL PHASE 10/19/2016 Cervical Esophageal Phase (No Data) Pudding Teaspoon -- Pudding Cup -- Honey Teaspoon -- Honey Cup -- Nectar Teaspoon -- Nectar Cup -- Nectar Straw -- Thin Teaspoon -- Thin Cup -- Thin Straw -- Puree -- Mechanical Soft -- Regular -- Multi-consistency -- Pill -- Cervical Esophageal Comment -- No flowsheet data found. [name] 10/20/2016, 2:34 PM [name].Ed CCC-SLP Pager [phone]              Dg Swallowing Func-speech Pathology  Result Date: 10/17/2016 Objective Swallowing Evaluation: Type of Study:  MBS-Modified Barium Swallow Study Patient Details Name: Barry Mccoy MRN: [id number] Date of Birth: 1950-11-16 Today's Date: 10/17/2016 Time: SLP Start Time (ACUTE ONLY): 1145-SLP Stop Time (ACUTE ONLY): 1202 SLP Time Calculation (min) (ACUTE ONLY): 17 min Past Medical History: Past Medical History:  Diagnosis Date . Muscle spasm  Past Surgical History: Past Surgical History: Procedure Laterality Date . ELBOW SURGERY   . ESOPHAGOGASTRODUODENOSCOPY N/A 10/04/2016  Procedure: ESOPHAGOGASTRODUODENOSCOPY (EGD);  Surgeon: Carol Ada, MD;  Location: [facility];  Service: Gastroenterology;  Laterality: N/A; HPI: 66 year old male admitted 10/01/16 due to near syncope, AMS, acute encephalopathy and intermittent hypoxia. Pt intubated 1/24-28/18 due to respiratory distress. UGI 10/04/16 - hypertensive gastropathy vs inflammatory gastritis. CXR 10/07/16 - bilateral airspace disease. PMH unremarkable. No Data Recorded Assessment / Plan / Recommendation CHL IP CLINICAL IMPRESSIONS 10/17/2016 Therapy Diagnosis Mild pharyngeal phase dysphagia;Mild oral phase dysphagia Clinical Impression Pt demonstrates a mild oropharyngeal dysphagia, likely secondary to sluggish motor control and lethargy associated with this acute illness. Pt difficult to arouse for MBS, continues to be dysarthric and impulsive with self feeding. Primary problem is impulsive, overlarge intake of cup and straw sips of thin liquids leading to premature spillage into the vestibule and delayed swallow initiation with penetration before the swallow. Penetration events are silent, but clear with a cued throat clear. Pts dentures were not present for this test, but mastication of solids was low, requiring verbal cues. Recommend pt continue current diet recommendation of nectar thick liquids and dys 2(fine chopped solids) until mentation improves and arousal is consistent. Pt should not remain on nectar thick liquids long term as dysphagia is relatively mild. Will discuss with  pts daughter.  Impact on safety and function Mild aspiration risk   CHL IP TREATMENT RECOMMENDATION 10/17/2016 Treatment Recommendations Therapy as outlined in treatment plan below   Prognosis 10/17/2016 Prognosis for Safe Diet Advancement Good Barriers to Reach Goals Cognitive deficits Barriers/Prognosis Comment -- CHL IP DIET RECOMMENDATION 10/17/2016 SLP Diet Recommendations Dysphagia 2 (Fine chop) solids;Nectar thick liquid Liquid Administration via Cup;Straw Medication Administration Whole meds with puree Compensations Slow rate;Small sips/bites Postural Changes Seated upright at 90 degrees   CHL IP OTHER RECOMMENDATIONS 10/17/2016 Recommended Consults -- Oral Care Recommendations Oral care BID Other Recommendations Order thickener from pharmacy   CHL IP FOLLOW UP RECOMMENDATIONS 10/17/2016 Follow up Recommendations Skilled Nursing facility   [facility] IP FREQUENCY AND DURATION 10/17/2016 Speech Therapy Frequency (ACUTE ONLY) min 2x/week Treatment Duration 2 weeks      CHL IP ORAL PHASE 10/17/2016 Oral Phase Impaired Oral - Pudding Teaspoon -- Oral - Pudding Cup -- Oral - Honey Teaspoon -- Oral - Honey Cup -- Oral - Nectar Teaspoon -- Oral - Nectar Cup -- Oral - Nectar Straw WFL Oral - Thin Teaspoon -- Oral - Thin Cup Premature spillage Oral - Thin Straw Premature spillage Oral - Puree Decreased bolus cohesion;Delayed oral transit Oral - Mech Soft Decreased bolus cohesion;Delayed oral transit;Lingual/palatal residue;Impaired mastication Oral - Regular -- Oral - Multi-Consistency -- Oral - Pill -- Oral Phase - Comment --  CHL IP PHARYNGEAL PHASE 10/17/2016 Pharyngeal Phase Impaired Pharyngeal- Pudding Teaspoon -- Pharyngeal -- Pharyngeal- Pudding Cup -- Pharyngeal -- Pharyngeal- Honey Teaspoon -- Pharyngeal -- Pharyngeal- Honey Cup -- Pharyngeal -- Pharyngeal- Nectar Teaspoon -- Pharyngeal -- Pharyngeal- Nectar Cup WFL Pharyngeal -- Pharyngeal- Nectar Straw WFL Pharyngeal -- Pharyngeal- Thin Teaspoon -- Pharyngeal -- Pharyngeal- Thin  Cup Delayed swallow initiation-pyriform sinuses;Penetration/Aspiration before swallow Pharyngeal Material enters airway, remains ABOVE vocal cords then ejected out;Material does not enter airway;Material enters airway, CONTACTS cords and then ejected out Pharyngeal- Thin Straw Delayed swallow initiation-pyriform sinuses;Penetration/Aspiration before swallow Pharyngeal Material enters airway, CONTACTS cords and not ejected out Pharyngeal- Puree WFL Pharyngeal -- Pharyngeal- Mechanical Soft WFL Pharyngeal -- Pharyngeal- Regular --  Pharyngeal -- Pharyngeal- Multi-consistency -- Pharyngeal -- Pharyngeal- Pill -- Pharyngeal -- Pharyngeal Comment --  No flowsheet data found. No flowsheet data found. Herbie Baltimore, MA CCC-SLP [phone] Lynann Beaver 10/17/2016, 2:07 PM               Subjective: No complains  Discharge Exam: Vitals:   11/09/16 2022 11/10/16 0508  BP: (!) 107/59 (!) 91/45  Pulse: 80 65  Resp:    Temp: 98.2 F (36.8 C) 98.1 F (36.7 C)   Vitals:   11/09/16 0800 11/09/16 1133 11/09/16 2022 11/10/16 0508  BP: (!) 100/50 118/62 (!) 107/59 (!) 91/45  Pulse: 72  80 65  Resp:      Temp: 98.3 F (36.8 C)  98.2 F (36.8 C) 98.1 F (36.7 C)  TempSrc: Oral  Oral Oral  SpO2: 100%  100% 99%  Weight:      Height:        General: Pt is alert, awake, not in acute distress Cardiovascular: RRR, S1/S2 +, no rubs, no gallops Respiratory: CTA bilaterally, no wheezing, no rhonchi Abdominal: Soft, NT, ND, bowel sounds + Extremities: no edema, no cyanosis    The results of significant diagnostics from this hospitalization (including imaging, microbiology, ancillary and laboratory) are listed below for reference.     Microbiology: Recent Results (from the past 240 hour(s))  Urine culture     Status: None   Collection Time: 11/05/16 11:23 AM  Result Value Ref Range Status   Organism ID, Bacteria NO GROWTH  Final     Labs: BNP (last 3 results) No results for input(s): BNP in  the last 8760 hours. Basic Metabolic Panel:  Recent Labs Lab 11/05/16 1123 11/05/16 2250 11/06/16 1102  NA 141 135 137  K 4.2 4.0 3.8  CL 105 103 108  CO2 28 26 25   GLUCOSE 137* 93 105*  BUN 8 10 11   CREATININE 1.06 1.10 0.94  CALCIUM 9.1 9.1 8.4*  MG 2.0  --   --    Liver Function Tests:  Recent Labs Lab 11/05/16 1123  AST 14  ALT 14  ALKPHOS 60  BILITOT 0.3  PROT 5.8*  ALBUMIN 3.6   No results for input(s): LIPASE, AMYLASE in the last 168 hours.  Recent Labs Lab 11/05/16 2332  AMMONIA 23   CBC:  Recent Labs Lab 11/05/16 1123 11/05/16 2250 11/06/16 1102 11/07/16 0506  WBC 5.2 6.4 4.8 5.4  NEUTROABS 2,392  --   --   --   HGB 7.6* 7.1* 8.1* 8.0*  HCT 25.4* 22.6* 26.0* 25.4*  MCV 82.2 79.3 80.5 82.2  PLT 177 188 134* 128*   Cardiac Enzymes: No results for input(s): CKTOTAL, CKMB, CKMBINDEX, TROPONINI in the last 168 hours. BNP: Invalid input(s): POCBNP CBG:  Recent Labs Lab 11/05/16 2328  GLUCAP 89   D-Dimer No results for input(s): DDIMER in the last 72 hours. Hgb A1c No results for input(s): HGBA1C in the last 72 hours. Lipid Profile No results for input(s): CHOL, HDL, LDLCALC, TRIG, CHOLHDL, LDLDIRECT in the last 72 hours. Thyroid function studies No results for input(s): TSH, T4TOTAL, T3FREE, THYROIDAB in the last 72 hours.  Invalid input(s): FREET3 Anemia work up No results for input(s): VITAMINB12, FOLATE, FERRITIN, TIBC, IRON, RETICCTPCT in the last 72 hours. Urinalysis    Component Value Date/Time   COLORURINE YELLOW 11/05/2016 [place] 11/05/2016 2328   LABSPEC 1.011 11/05/2016 2328   PHURINE 6.0 11/05/2016 2328   GLUCOSEU NEGATIVE 11/05/2016  [place] (A) 11/05/2016 2328   BILIRUBINUR NEGATIVE 11/05/2016 2328   KETONESUR NEGATIVE 11/05/2016 2328   PROTEINUR NEGATIVE 11/05/2016 2328   NITRITE NEGATIVE 11/05/2016 2328   LEUKOCYTESUR TRACE (A) 11/05/2016 2328   Sepsis Labs Invalid input(s):  PROCALCITONIN,  WBC,  LACTICIDVEN Microbiology Recent Results (from the past 240 hour(s))  Urine culture     Status: None   Collection Time: 11/05/16 11:23 AM  Result Value Ref Range Status   Organism ID, Bacteria NO GROWTH  Final     Time coordinating discharge: Over 30 minutes  SIGNED:   Charlynne Cousins, MD  [facility] 11/10/2016, 7:49 AM Pager   If 7PM-7AM, please contact night-coverage [email or web address] Password [email or web address]

## 2016-11-12 ENCOUNTER — Encounter (HOSPITAL_COMMUNITY): Payer: Self-pay | Admitting: Gastroenterology

## 2016-11-14 ENCOUNTER — Telehealth: Payer: Self-pay

## 2016-11-14 NOTE — Telephone Encounter (Signed)
Need to get notes from GI so I have documentation to review

## 2016-11-14 NOTE — Telephone Encounter (Signed)
Patient daughter called and stated the GI nurse states test came back clear no bleeding going on in GI tract.[name] states  patient need to have blood work done and possible schedule hematologist appointment which is what she states the GI nurse said was the next step. [name] also states the nurse at GI doctor told her it was no need for an f/u visit because all test came back fine.  [name] the patient's daughter also wants to move her father out to [facility] to live with her and wants to make sure Barry Mccoy is stable to do that.   Explained to [name] it would be best for her to schedule an appointment to have her dad come in so that all questions regarding his health can be answered by the PA as well as have his blood drawn. Appointment has been made for 3/8

## 2016-11-15 ENCOUNTER — Ambulatory Visit (INDEPENDENT_AMBULATORY_CARE_PROVIDER_SITE_OTHER): Payer: Medicare Other | Admitting: Physician Assistant

## 2016-11-15 ENCOUNTER — Encounter: Payer: Self-pay | Admitting: Physician Assistant

## 2016-11-15 VITALS — BP 96/60 | HR 79 | Temp 97.5°F | Resp 16

## 2016-11-15 DIAGNOSIS — R109 Unspecified abdominal pain: Secondary | ICD-10-CM | POA: Diagnosis not present

## 2016-11-15 DIAGNOSIS — R112 Nausea with vomiting, unspecified: Secondary | ICD-10-CM

## 2016-11-15 DIAGNOSIS — D62 Acute posthemorrhagic anemia: Secondary | ICD-10-CM | POA: Diagnosis not present

## 2016-11-15 DIAGNOSIS — D649 Anemia, unspecified: Secondary | ICD-10-CM

## 2016-11-15 DIAGNOSIS — I639 Cerebral infarction, unspecified: Secondary | ICD-10-CM

## 2016-11-15 LAB — CBC
HCT: 28.5 % — ABNORMAL LOW (ref 38.5–50.0)
Hemoglobin: 8.4 g/dL — CL (ref 13.0–17.0)
MCH: 25.7 pg — ABNORMAL LOW (ref 27.0–33.0)
MCHC: 29.5 g/dL — AB (ref 32.0–36.0)
MCV: 87.2 fL (ref 80.0–100.0)
PLATELETS: 152 10*3/uL (ref 140–400)
RBC: 3.27 MIL/uL — AB (ref 4.20–5.80)
RDW: 17.5 % — ABNORMAL HIGH (ref 11.0–15.0)
WBC: 5.7 10*3/uL (ref 3.8–10.8)

## 2016-11-15 NOTE — Progress Notes (Signed)
Patient ID: Barry Mccoy MRN: [id number], DOB: 07-11-1951, 66 y.o. Date of Encounter: @DATE @  Chief Complaint:  Chief Complaint  Patient presents with  . Hospitalization Follow-up  .       Marland Kitchen     HPI: 65 y.o. year old male  presents for Hospital Discharge Follow Up.    ------------------------------THIS SECTION OF THE NOTE IS COPIED FROM MY OFFICE NOTE WITH PATIENT ON 11/05/2016:-------------------------------------------------------------------------------------------------------   THE FOLLOWING IS COPIED FROM HIS DISCHARGE SUMMARY AND I REVIEWED THE FOLLOWING INFORMATION TODAY:  Admit date: 10/23/2016 Discharge date: 10/27/2016  Admitted From: Home  Disposition:  Home with 24/7 family supervision and [place] services RN, SW, Aide  Recommendations for Outpatient Follow-up:  1. Follow up with PCP in 1 weeks 2. Please follow up with urology in 1 week to consider void trial 3. Please follow up with GI for outpatient colonoscopy in 2 weeks and evaluate pancreatic cysts 4. Please follow up with neurologist in 2-3 weeks for neurocognitive testing and evaluation 5. Please consider MRI abdomen in 1-2 months to evaluate pancreatic cyst noted on CT  Discharge Condition: STABLE  CODE STATUS: FULL   Brief/Interim Summary: Chief Complaint: Confusion, agitation  HPI: Barry Mccoy a 66 y.o.malewith medical history significant for anxiety, depression, and alcohol abuse, presenting to the emergency department from his SNF for evaluation of acute confusion and agitation. He had just been discharged from the hospital earlier today, but left the SNF, walking out into traffic, apparently confused and agitated. IVC was placed and the patient was brought into the ED by the PD. Patient had beenadmitted to the hospital on 10/01/2016 with melena, dyspnea, hypotension, and syncope. He was transfused 5 units of packed red blood cells during that admission which was complicated by delirium and agitation  requiring high doses of benzodiazepines which precipitated respiratory failure, necessitating intubation. He was extubated on 10/07/2016. Patient continued to be delirious and agitated, requiring 4 point restraints and pharmacologic restraint. Neurology consultation was obtained during the hospitalization and the patient was started on high-dose thiamine which has since been tapered down. Outpatient neurocognitive testing was planned. He was also evaluated by gastroenterology with EGD notable for portal gastropathy and he was placed on Protonix daily. He was ultimately discharged to SNF with plans to continue weaning Seroquel and Haldol. Admission was also complicated by urinary retention. Voiding trial was unsuccessful prior to discharge and Foley was replaced. Patient has been kept on Flomax with outpatient urology follow-up.   ED Course:Upon arrival to the ED, patient is found to be afebrile, saturating well on room air, and with vital signs otherwise stable. EKG features sinus rhythm with PAC and QTcof 503 ms. Chemistry panel is essentially normal and CBC is notable for a hemoglobin of 10.8, up from 9.6 five days ago. Urinalysis features many bacteria, TNTC white cells, large leukocytes, and positive nitrites. Large hemoglobin is also noted on dipstick. Patient was treated with an empiric dose of Rocephin in the emergency department, remained hemodynamically stable and in no apparent respiratory distress. He will be admitted to the telemetry unit for ongoing evaluation and management of acute encephalopathy, possibly secondary to UTI.  1. Acute encephalopathy - resolved now, likely was secondary to UTI - Pt became confused at his SNF and walked out into traffic, leading to IVC being placed, now has been rescinded - There are no focal neurologic deficits, CT head no acute findings, cerebral atrophy noted - Folate, B12, and TSH wnl during recent admission; he was treated with  high-dose thiamine; HIV  and RPR negative - There is UTI on presentation; It is being treated and still having confusion, CT head without contrast No acute findings.  - Requested psychiatry consultation 2/14: See notes, also requested neurology consult 2/16.  - Pt much improved since 2/15 with safety restraints removed and not requiring PRN sedatives - Neurology was consulted to see him 2/16 and they felt that symptoms related to infection in addition to underlying neurocognitive decline (see notes), MRI brain ordered: no acute findings, Stable moderate chronic microvascular ischemic changes and parenchymal volume loss of the brain.  Pt was advised to have outpatient neurology follow up for neurocognitive testing.  PT evaluated patient and advised no PT follow up.    2. UTI Staph species - UA consistent with UTI  - Empiric Rocephin given IV  - Urine sent for culture; prior culture grew multiple species, none isolated - Started oral Bactrim DS 2/16 based on C&S results, continue 7 more days of Bactrim DS  3. Normocytic anemia  - Hgb 10.8 on admission, up from 9.6 five days earlier  - Likely secondary to GI blood-loss as presented last month with melena and required 5 units of pRBCs; EGD was revealing of portal gastropathy  - No evidence for ongoing blood-loss - Continue daily Protonix   4. Urinary retention  - Failed voiding trial prior to recent discharge  - Continue Flomax, continue catheter care, follow up with urology outpatient after discharge, consider voiding trial in 1 week.   -  I had a discussion with patient about doing a voiding trial before discharge, he was concerned that he would fail the trial and have to have the foley replaced like he did last week so he opted to keep the foley in place and follow up with urology in 1 week for a void trial, he will continue taking the flomax.    5. Portal gastropathy, probable cirrhosis  - Gastropathy noted on EGD during recent admission and CT findings  suggestive of cirrhosis  - There is history of alcohol use per history - Continue daily Protonix - Follow up with GI in 2 weeks for consideration of colonoscopy and cirrhosis care.   - Pt should have MRI abdomen in 1-2 months to follow up on pancreatic cyst seen on CT scan.    6. Prolonged QTc  - QTc 503 ms on admission - Pt is on numerous offending agents, will attempt to minimize - Replace potassium to 4 and magnesium to 2 - He had been monitored on telemetry but has been stable, DC'd tele 2/16     TODAY---11/05/2016-----PT IS IN ROOM ALONE, BY HIMSELF.  THE FOLLOWING INFORMATION IS FROM TODAY'S VISIT: I asked if he is here alone. He says that his daughter is out in the vehicle in the parking lot.\ He says he is currently staying at his daughter's house. HomeHealthNurse comes there. I noted that he has no Foley catheter in place currently. I asked if he pulled that out /removed that himself. He says that "the doctor removed" the Foley. He cannot tell me which doctor. I asked if he has already had his f/u OV with Urology and he does not give me a definitve answer. Tells me that he has another appointment scheduled for tomorrow but isn't sure which doctor that is scheduled with. He has no specific complaints or concerns today.   ASSESSMENT/PLAN FROM OV 11/05/2016: 1. Hospital discharge follow-up Will check UA to make sure UTI resolved and will check  f/u labs to make sure these are stable.  He is to follow-up with urology and GI and neurology. Also will need MRI abdomen in 1-2 months to evaluate pancreatic cyst noted on CT. GI may f/u this.  - Urinalysis, Routine w reflex microscopic - CBC with Differential/Platelet - COMPLETE METABOLIC PANEL WITH GFR - Urine culture - Magnesium  2. Acute encephalopathy - Urinalysis, Routine w reflex microscopic - Urine culture  3. Acute GI bleeding - CBC with Differential/Platelet  4. Urinary tract infection with hematuria, site  unspecified - Urinalysis, Routine w reflex microscopic - Urine culture   Signed, [facility], [place], [facility] 11/15/2016 12:46 PM  Addendum (11/05/2016) Received call from [facility] regarding critically low hemoglobin of 7.6.  This is a precipitous drop from previous hgb in hospital.  Given that and his PMH and his new onset abdominal pain, I suspect Upper GI bleed and need for immediate ER evaluation.  Tried to call home phone and no answer at 8:55 PM.  I left voice message.  Tried to call mobile phone and that number will not accept outside calls.  I will try again later and also wait for him to return my call.  He needs to go to ER.    Jenna Luo, MD.  (on call for [facility])  Addendum 9:15.  Was finally able to locate patient at his daughter's home  [phone]) after calling his other daughter.  Explained to her that his blood counts are falling suggesting upper GI bleed and recommended ER evaluation.  She will take him to the ER ASAP.   Jenna Luo, MD     -------------------------------------------------------[facility] OFFICE VISIT NOTE 11/15/2016: ----------------------------------------------------------------------------------------------------------------------------------------------------------------  At his visit with me 11/05/16 labs were obtained which revealed hemoglobin 7.6. He was then called and told to go back to the ER. He was subsequently hospitalized 11/05/16 through 11/10/16. The following is copied from that discharge summary:   Admit date: 11/05/2016 Discharge date: 11/10/2016  Admitted From: Home Disposition:  Home  Recommendations for Outpatient Follow-up:  6. Follow up with Gastroenterology in 1-2 weeks 7. Please obtain CBC in one week, follow up on biopsy results. 8. Please follow up on the following pending results:  Home Health:NO Equipment/Devices:None  Discharge Condition:stable CODE STATUS:Full Diet recommendation: Heart Healthy    Brief/Interim Summary: 66 y.o.malewith medical history significant of alcohol abuse, anxiety, depression, and CKD stage II; who presents after being instructed to come in for low blood counts. Previously admitted from 1/22 through 2/13 for acute GI bleed withsymptomatic anemia. During hospitalization patient was transfused 5 units of PRBC and required intubation thought secondary to acute respiratory failure with hypoxia associated with delirium from alcohol withdrawal.   Discharge Diagnoses:  Principal Problem:   GI bleed Active Problems:   BPH (benign prostatic hyperplasia)   Acute blood loss anemia   Nausea with vomiting   Alcohol abuse   Acute GI bleed with acute blood loss: Of  Unclear source tenderness prior to admission his hemoglobin was 10.2, admission was 7.1 he was transfused 2 units of packed red blood cells, his colonoscopy did not show any signs of bleeding. Endoscopy was done as a follow-up as an outpatient.   ----------------------------------------------------------------------------OV BY ME ON 11/15/2016: --------------------------------------------------------------------------------------------------------------- In preparation for today's visit--- yesterday we had contacted [facility] to get some documentation. They faxed over [facility]aps[facility] Endoscopy Report. Summary and recommendations states-- no source of anemia seen. May need hematology referral if anemia recurs/persists. Today when patient arrived for his visit, his  daughter who accompanies him for visit, told our front office staff that he was extremely weak and lightheaded out in the parking lot.  Our staff then assisted with bringing him in using a wheelchair. CBC run here in the office shows hemoglobin 8.4 hematocrit 28.5 Patient and daughter also report that he has been having the following symptoms: States that yesterday he was "doubled over " secondary to sharp pain in his left abdomen. He vomited 3 times  between about 8:00 PM and 9:00 PM last night. Patient states that he saw black stool this morning. No other complaints or concerns voiced at today's visit.   Past Medical History:  Diagnosis Date  . Anxiety   . BPH (benign prostatic hyperplasia)   . Cervical spine disease   . Chronic kidney disease    kidney stones  . Depression   . Disorder of lumbar spine   . ETOH abuse   . GI bleed   . Muscle spasm      Home Meds: Outpatient Medications Prior to Visit  Medication Sig Dispense Refill  . acetaminophen (TYLENOL) 325 MG tablet Take 2 tablets (650 mg total) by mouth every 6 (six) hours as needed for mild pain, moderate pain or headache (or Fever >/= 101).    Marland Kitchen aspirin EC 81 MG tablet Take 1 tablet (81 mg total) by mouth every morning.    . baclofen (LIORESAL) 20 MG tablet TAKE 1 TABLET BY MOUTH EVERY 12 HOURS AS NEEDED FOR SPASMS 30 each 1  . Cholecalciferol (VITAMIN D-3) 5000 units TABS Take 5,000 Units by mouth at bedtime.    . folic acid (FOLVITE) 1 MG tablet Take 1 tablet (1 mg total) by mouth daily. (Patient taking differently: Take 1 mg by mouth every morning. ) 30 tablet 0  . lidocaine (LIDODERM) 5 % Place 1 patch onto the skin every 12 (twelve) hours as needed (pain).   1  . metoprolol tartrate (LOPRESSOR) 25 MG tablet Take 1 tablet (25 mg total) by mouth 2 (two) times daily. 60 tablet 0  . Multiple Vitamin (MULTIVITAMIN WITH MINERALS) TABS tablet Take 1 tablet by mouth at bedtime.    . nicotine (NICODERM CQ - DOSED IN MG/24 HOURS) 14 mg/24hr patch Place 1 patch (14 mg total) onto the skin daily. 28 patch 0  . OnabotulinumtoxinA (BOTOX IJ) Inject as directed See admin instructions. Botox injections done at Dr. Audery Amel office approximately every 90 days    . pantoprazole (PROTONIX) 40 MG tablet Take 1 tablet (40 mg total) by mouth daily.    . polyethylene glycol (MIRALAX / GLYCOLAX) packet Take 17 g by mouth daily as needed for mild constipation. 14 each 0  . QUEtiapine  (SEROQUEL) 50 MG tablet Take 1 tablet (50 mg total) by mouth at bedtime. 30 tablet 0  . sertraline (ZOLOFT) 50 MG tablet Take 1 tablet (50 mg total) by mouth at bedtime. 30 tablet 0  . sulfamethoxazole-trimethoprim (BACTRIM DS,SEPTRA DS) 800-160 MG tablet Take 1 tablet by mouth every 12 (twelve) hours.    . tamsulosin (FLOMAX) 0.4 MG CAPS capsule Take 1 capsule (0.4 mg total) by mouth at bedtime. 30 capsule 0  . thiamine 100 MG tablet Take 1 tablet (100 mg total) by mouth daily. For 4days then down to 100mg  daily (Patient taking differently: Take 250 mg by mouth daily. For 4days then down to 100mg  daily)    . traZODone (DESYREL) 100 MG tablet Take 1 tablet (100 mg total) by mouth at bedtime.  No facility-administered medications prior to visit.     Allergies:  Allergies  Allergen Reactions  . Shellfish Allergy Other (See Comments)    Unknown     Social History   Social History  . Marital status: Widowed    Spouse name: N/A  . Number of children: N/A  . Years of education: N/A   Occupational History  . Not on file.   Social History Main Topics  . Smoking status: Former Smoker    Packs/day: 1.00    Years: 50.00    Types: Cigarettes    Quit date: 10/23/2016  . Smokeless tobacco: Never Used     Comment: attempting to quit- down to 8 a day   . Alcohol use No     Comment: 2 drinks a month  . Drug use: No  . Sexual activity: Yes   Other Topics Concern  . Not on file   Social History Narrative   ** Merged History Encounter **        Family History  Problem Relation Age of Onset  . Colon cancer Brother   . Alcohol abuse Brother   . Cancer Brother   . Early death Brother   . Prostate cancer Brother   . Colon polyps Brother   . Cancer Mother 74    colon cancer  . Colon cancer Mother      Review of Systems:  See HPI for pertinent ROS. All other ROS negative.    Physical Exam: Blood pressure 96/60, pulse 79, temperature 97.5 F (36.4 C), temperature source  Oral, resp. rate 16, SpO2 99 %., There is no height or weight on file to calculate BMI. General: WM.Pale.  Appears in no acute distress. Neck: Supple. No thyromegaly. No lymphadenopathy. Lungs: Clear bilaterally to auscultation without wheezes, rales, or rhonchi. Breathing is unlabored. Heart: RRR with S1 S2. No murmurs, rubs, or gallops. Musculoskeletal:  Strength and tone normal for age. Extremities/Skin: Warm and dry.  Neuro: Alert .Moves all extremities spontaneously. Gait is normal. CNII-XII grossly in tact. Psych:  Responds to questions. Abnormal affect.   Results for orders placed or performed in visit on 11/15/16  CBC  Result Value Ref Range   WBC 5.7 3.8 - 10.8 K/uL   RBC 3.27 (L) 4.20 - 5.80 MIL/uL   Hemoglobin 8.4 (LL) 13.0 - 17.0 g/dL   HCT 28.5 (L) 38.5 - 50.0 %   MCV 87.2 80.0 - 100.0 fL   MCH 25.7 (L) 27.0 - 33.0 pg   MCHC 29.5 (L) 32.0 - 36.0 g/dL   RDW 17.5 (H) 11.0 - 15.0 %   Platelets 152 140 - 400 K/uL     ASSESSMENT AND PLAN:  66 y.o. year old male with   1. Hospital discharge follow-up  Anemia, unspecified type - Ambulatory referral to Hematology - Ambulatory referral to Gastroenterology  2. Acute blood loss anemia - CBC - Ambulatory referral to Gastroenterology  3. Left sided abdominal pain - Ambulatory referral to Gastroenterology  4. Non-intractable vomiting with nausea, unspecified vomiting type - Ambulatory referral to Gastroenterology  H/H stable since hospital discharge. Last H/H at the hospital was on 11/07/16----8.0 /  25.4 At this time I have spoken with our referral staff and explained need for urgent follow-up at hematology and GI. Daughter who accompanies patient for visit is distraught about his continued symptoms despite multiple tests and hospitalizations.

## 2016-11-16 ENCOUNTER — Encounter (HOSPITAL_COMMUNITY): Payer: Self-pay | Admitting: Emergency Medicine

## 2016-11-16 ENCOUNTER — Emergency Department (HOSPITAL_COMMUNITY): Payer: Medicare Other

## 2016-11-16 ENCOUNTER — Inpatient Hospital Stay (HOSPITAL_COMMUNITY)
Admission: EM | Admit: 2016-11-16 | Discharge: 2016-11-21 | DRG: 377 | Disposition: A | Discharge status: Home / Self Care | Payer: Medicare Other | Attending: Internal Medicine | Admitting: Internal Medicine

## 2016-11-16 DIAGNOSIS — Z87891 Personal history of nicotine dependence: Secondary | ICD-10-CM

## 2016-11-16 DIAGNOSIS — N182 Chronic kidney disease, stage 2 (mild): Secondary | ICD-10-CM | POA: Diagnosis present

## 2016-11-16 DIAGNOSIS — F329 Major depressive disorder, single episode, unspecified: Secondary | ICD-10-CM | POA: Diagnosis present

## 2016-11-16 DIAGNOSIS — Z800 Family history of malignant neoplasm of digestive organs: Secondary | ICD-10-CM

## 2016-11-16 DIAGNOSIS — K863 Pseudocyst of pancreas: Secondary | ICD-10-CM

## 2016-11-16 DIAGNOSIS — D62 Acute posthemorrhagic anemia: Secondary | ICD-10-CM

## 2016-11-16 DIAGNOSIS — F419 Anxiety disorder, unspecified: Secondary | ICD-10-CM | POA: Diagnosis present

## 2016-11-16 DIAGNOSIS — R109 Unspecified abdominal pain: Secondary | ICD-10-CM | POA: Diagnosis not present

## 2016-11-16 DIAGNOSIS — K922 Gastrointestinal hemorrhage, unspecified: Secondary | ICD-10-CM | POA: Diagnosis not present

## 2016-11-16 DIAGNOSIS — Z87442 Personal history of urinary calculi: Secondary | ICD-10-CM

## 2016-11-16 DIAGNOSIS — Z8042 Family history of malignant neoplasm of prostate: Secondary | ICD-10-CM

## 2016-11-16 DIAGNOSIS — K858 Other acute pancreatitis without necrosis or infection: Secondary | ICD-10-CM | POA: Diagnosis present

## 2016-11-16 DIAGNOSIS — I471 Supraventricular tachycardia: Secondary | ICD-10-CM | POA: Diagnosis not present

## 2016-11-16 DIAGNOSIS — K921 Melena: Principal | ICD-10-CM | POA: Diagnosis present

## 2016-11-16 DIAGNOSIS — Z7982 Long term (current) use of aspirin: Secondary | ICD-10-CM

## 2016-11-16 DIAGNOSIS — K859 Acute pancreatitis without necrosis or infection, unspecified: Secondary | ICD-10-CM | POA: Diagnosis not present

## 2016-11-16 DIAGNOSIS — Z811 Family history of alcohol abuse and dependence: Secondary | ICD-10-CM

## 2016-11-16 DIAGNOSIS — N400 Enlarged prostate without lower urinary tract symptoms: Secondary | ICD-10-CM | POA: Diagnosis present

## 2016-11-16 DIAGNOSIS — I129 Hypertensive chronic kidney disease with stage 1 through stage 4 chronic kidney disease, or unspecified chronic kidney disease: Secondary | ICD-10-CM | POA: Diagnosis present

## 2016-11-16 LAB — URINALYSIS, ROUTINE W REFLEX MICROSCOPIC
Bilirubin Urine: NEGATIVE
Glucose, UA: NEGATIVE mg/dL
Hgb urine dipstick: NEGATIVE
KETONES UR: NEGATIVE mg/dL
Nitrite: NEGATIVE
Protein, ur: NEGATIVE mg/dL
Specific Gravity, Urine: 1.011 (ref 1.005–1.030)
pH: 7 (ref 5.0–8.0)

## 2016-11-16 LAB — COMPREHENSIVE METABOLIC PANEL
ALT: 13 U/L — ABNORMAL LOW (ref 17–63)
AST: 14 U/L — ABNORMAL LOW (ref 15–41)
Albumin: 3.3 g/dL — ABNORMAL LOW (ref 3.5–5.0)
Alkaline Phosphatase: 61 U/L (ref 38–126)
Anion gap: 5 (ref 5–15)
BUN: 14 mg/dL (ref 6–20)
CHLORIDE: 104 mmol/L (ref 101–111)
CO2: 26 mmol/L (ref 22–32)
CREATININE: 0.82 mg/dL (ref 0.61–1.24)
Calcium: 8.9 mg/dL (ref 8.9–10.3)
GFR calc non Af Amer: >60 mL/min (ref >60)
Glucose, Bld: 115 mg/dL — ABNORMAL HIGH (ref 65–99)
Potassium: 3.8 mmol/L (ref 3.5–5.1)
SODIUM: 135 mmol/L (ref 135–145)
Total Bilirubin: 0.5 mg/dL (ref 0.3–1.2)
Total Protein: 6.2 g/dL — ABNORMAL LOW (ref 6.5–8.1)

## 2016-11-16 LAB — CBC
HCT: 22.9 % — ABNORMAL LOW (ref 39.0–52.0)
HEMOGLOBIN: 7.2 g/dL — AB (ref 13.0–17.0)
MCH: 25.6 pg — ABNORMAL LOW (ref 26.0–34.0)
MCHC: 31.4 g/dL (ref 30.0–36.0)
MCV: 81.5 fL (ref 78.0–100.0)
PLATELETS: 158 10*3/uL (ref 150–400)
RBC: 2.81 MIL/uL — AB (ref 4.22–5.81)
RDW: 17.6 % — ABNORMAL HIGH (ref 11.5–15.5)
WBC: 7.7 10*3/uL (ref 4.0–10.5)

## 2016-11-16 LAB — PREPARE RBC (CROSSMATCH)

## 2016-11-16 LAB — LIPASE, BLOOD: LIPASE: 53 U/L — AB (ref 11–51)

## 2016-11-16 LAB — POC OCCULT BLOOD, ED: FECAL OCCULT BLD: POSITIVE — AB

## 2016-11-16 MED ORDER — ONDANSETRON HCL 4 MG/2ML IJ SOLN: 4.0000 mg | Freq: Four times a day (QID) | INTRAMUSCULAR | Status: DC | PRN

## 2016-11-16 MED ORDER — ONDANSETRON HCL 4 MG/2ML IJ SOLN
4.0000 mg | Freq: Once | INTRAMUSCULAR | Status: AC
  Administered 2016-11-16: 4 mg via INTRAVENOUS
  Filled 2016-11-16: qty 2

## 2016-11-16 MED ORDER — ALBUTEROL SULFATE (2.5 MG/3ML) 0.083% IN NEBU: 2.5000 mg | INHALATION_SOLUTION | RESPIRATORY_TRACT | Status: DC | PRN

## 2016-11-16 MED ORDER — ACETAMINOPHEN 325 MG PO TABS
650.0000 mg | ORAL_TABLET | Freq: Four times a day (QID) | ORAL | Status: DC | PRN
Start: 2016-11-16 — End: 2016-11-21
  Administered 2016-11-16 – 2016-11-21 (×4): 650 mg via ORAL
  Filled 2016-11-16 (×4): qty 2

## 2016-11-16 MED ORDER — FAMOTIDINE IN NACL 20-0.9 MG/50ML-% IV SOLN
20.0000 mg | Freq: Once | INTRAVENOUS | Status: AC
  Administered 2016-11-16: 20 mg via INTRAVENOUS
  Filled 2016-11-16: qty 50

## 2016-11-16 MED ORDER — BACLOFEN 10 MG PO TABS
10.0000 mg | ORAL_TABLET | Freq: Two times a day (BID) | ORAL | Status: DC | PRN
  Administered 2016-11-19: 10 mg via ORAL
  Filled 2016-11-16: qty 1

## 2016-11-16 MED ORDER — ACETAMINOPHEN 650 MG RE SUPP: 650.0000 mg | Freq: Four times a day (QID) | RECTAL | Status: DC | PRN

## 2016-11-16 MED ORDER — QUETIAPINE FUMARATE 50 MG PO TABS: 50.0000 mg | ORAL_TABLET | Freq: Every day | ORAL | Status: DC

## 2016-11-16 MED ORDER — VITAMIN D3 25 MCG (1000 UNIT) PO TABS
5000.0000 [IU] | ORAL_TABLET | Freq: Every day | ORAL | Status: DC
  Administered 2016-11-17 – 2016-11-21 (×4): 5000 [IU] via ORAL
  Filled 2016-11-16 (×4): qty 5

## 2016-11-16 MED ORDER — TAMSULOSIN HCL 0.4 MG PO CAPS
0.4000 mg | ORAL_CAPSULE | Freq: Every day | ORAL | Status: DC
  Administered 2016-11-17 – 2016-11-20 (×5): 0.4 mg via ORAL
  Filled 2016-11-16 (×5): qty 1

## 2016-11-16 MED ORDER — SODIUM CHLORIDE 0.9 % IV BOLUS (SEPSIS)
1000.0000 mL | Freq: Once | INTRAVENOUS | Status: AC
  Administered 2016-11-16: 1000 mL via INTRAVENOUS

## 2016-11-16 MED ORDER — MORPHINE SULFATE (PF) 4 MG/ML IV SOLN
4.0000 mg | Freq: Once | INTRAVENOUS | Status: AC
  Administered 2016-11-16: 4 mg via INTRAVENOUS
  Filled 2016-11-16: qty 1

## 2016-11-16 MED ORDER — SODIUM CHLORIDE 0.9 % IV SOLN
INTRAVENOUS | Status: AC
  Administered 2016-11-17: 01:00:00 via INTRAVENOUS

## 2016-11-16 MED ORDER — VITAMIN B-1 100 MG PO TABS
100.0000 mg | ORAL_TABLET | Freq: Every day | ORAL | Status: DC
  Administered 2016-11-17 – 2016-11-21 (×4): 100 mg via ORAL
  Filled 2016-11-16 (×5): qty 1

## 2016-11-16 MED ORDER — METOPROLOL TARTRATE 25 MG PO TABS
25.0000 mg | ORAL_TABLET | Freq: Two times a day (BID) | ORAL | Status: DC
  Administered 2016-11-17: 25 mg via ORAL
  Filled 2016-11-16: qty 1

## 2016-11-16 MED ORDER — IOPAMIDOL (ISOVUE-300) INJECTION 61%
100.0000 mL | Freq: Once | INTRAVENOUS | Status: AC | PRN
  Administered 2016-11-16: 100 mL via INTRAVENOUS

## 2016-11-16 MED ORDER — FOLIC ACID 1 MG PO TABS
1.0000 mg | ORAL_TABLET | ORAL | Status: DC
  Administered 2016-11-17 – 2016-11-21 (×4): 1 mg via ORAL
  Filled 2016-11-16 (×5): qty 1

## 2016-11-16 MED ORDER — SODIUM CHLORIDE 0.9 % IV SOLN
10.0000 mL/h | Freq: Once | INTRAVENOUS | Status: AC
  Administered 2016-11-16: 10 mL/h via INTRAVENOUS

## 2016-11-16 MED ORDER — ADULT MULTIVITAMIN W/MINERALS CH
1.0000 | ORAL_TABLET | Freq: Every day | ORAL | Status: DC
  Administered 2016-11-17 – 2016-11-20 (×5): 1 via ORAL
  Filled 2016-11-16 (×5): qty 1

## 2016-11-16 MED ORDER — MORPHINE SULFATE (PF) 4 MG/ML IV SOLN
4.0000 mg | INTRAVENOUS | Status: DC | PRN
  Administered 2016-11-16 – 2016-11-17 (×2): 4 mg via INTRAVENOUS
  Filled 2016-11-16 (×3): qty 1

## 2016-11-16 MED ORDER — PANTOPRAZOLE SODIUM 40 MG IV SOLR
40.0000 mg | Freq: Two times a day (BID) | INTRAVENOUS | Status: DC
  Administered 2016-11-17 – 2016-11-20 (×8): 40 mg via INTRAVENOUS
  Filled 2016-11-16 (×8): qty 40

## 2016-11-16 MED ORDER — IOPAMIDOL (ISOVUE-300) INJECTION 61%
INTRAVENOUS | Status: AC
  Filled 2016-11-16: qty 100

## 2016-11-16 MED ORDER — POLYETHYLENE GLYCOL 3350 17 G PO PACK
17.0000 g | PACK | Freq: Every day | ORAL | Status: DC | PRN
Start: 2016-11-16 — End: 2016-11-21

## 2016-11-16 MED ORDER — SODIUM CHLORIDE 0.9% FLUSH
3.0000 mL | Freq: Two times a day (BID) | INTRAVENOUS | Status: DC
  Administered 2016-11-16 – 2016-11-21 (×6): 3 mL via INTRAVENOUS

## 2016-11-16 NOTE — ED Notes (Signed)
ED Provider at bedside. 

## 2016-11-16 NOTE — ED Triage Notes (Signed)
Patient is complaining of LLQ abdominal pain x 2 days (becoming more severe today). Reports nausea/vomiting and "black stool."

## 2016-11-16 NOTE — Consult Note (Signed)
[facility] Consult Note  Referring Provider: No ref. provider found Primary Care Physician:  Karis Juba, PA-C Primary Gastroenterologist:  Dr.  Laurel Dimmer Complaint: Abdominal pain and loose stools HPI: Barry Mccoy is an 66 y.o. white male  admitted several times in the last 2 months with subacute GI bleeding and anemia with EGD, capsule endoscopy and colonoscopy relatively unrevealing. She presents the 2 day history of epigastric and left-sided pain and noticing that his stools have darkened. His hemoglobin was 7.2 which was slightly down from his recent baseline between 8 and 9. He had a minimally elevated lipase and a fluid collection in the tail of his pancreas and a calcification in the head of the pancreas possibly representing an obstructing stone. He has a history of ethanol abuse but no documented previous episodes of pancreatitis.  Past Medical History:  Diagnosis Date  . Anxiety   . BPH (benign prostatic hyperplasia)   . Cervical spine disease   . Chronic kidney disease    kidney stones  . Depression   . Disorder of lumbar spine   . ETOH abuse   . GI bleed   . Muscle spasm     Past Surgical History:  Procedure Laterality Date  . COLONOSCOPY WITH PROPOFOL N/A 11/08/2016   Procedure: COLONOSCOPY WITH PROPOFOL;  Surgeon: Wilford Corner, MD;  Location: [facility];  Service: Endoscopy;  Laterality: N/A;  . ELBOW SURGERY     as child  . ELBOW SURGERY    . ESOPHAGOGASTRODUODENOSCOPY N/A 10/04/2016   Procedure: ESOPHAGOGASTRODUODENOSCOPY (EGD);  Surgeon: Carol Ada, MD;  Location: [place];  Service: Gastroenterology;  Laterality: N/A;  . GIVENS CAPSULE STUDY N/A 11/09/2016   Procedure: GIVENS CAPSULE STUDY;  Surgeon: Wilford Corner, MD;  Location: [facility];  Service: Endoscopy;  Laterality: N/A;     (Not in a hospital admission)  Allergies:  Allergies  Allergen Reactions  . Shellfish Allergy Other (See Comments)    Unknown     Family History   Problem Relation Age of Onset  . Colon cancer Brother   . Alcohol abuse Brother   . Cancer Brother   . Early death Brother   . Prostate cancer Brother   . Colon polyps Brother   . Cancer Mother 64    colon cancer  . Colon cancer Mother     Social History:  reports that he quit smoking about 3 weeks ago. His smoking use included Cigarettes. He has a 50.00 pack-year smoking history. He has never used smokeless tobacco. He reports that he does not drink alcohol or use drugs.  Review of Systems: negative except As above   Blood pressure (!) 107/52, pulse 70, temperature 97.8 F (36.6 C), temperature source Oral, resp. rate 16, height 6' 2"  (1.88 m), weight 95.7 kg (211 lb), SpO2 100 %. Head: Normocephalic, without obvious abnormality, atraumatic Neck: no adenopathy, no carotid bruit, no JVD, supple, symmetrical, trachea midline and thyroid not enlarged, symmetric, no tenderness/mass/nodules Resp: clear to auscultation bilaterally Cardio: regular rate and rhythm, S1, S2 normal, no murmur, click, rub or gallop GI: Abdomen soft mildly tender in epigastrium and left upper quadrant Extremities: extremities normal, atraumatic, no cyanosis or edema  Results for orders placed or performed during the hospital encounter of 11/16/16 (from the past 48 hour(s))  Lipase, blood     Status: Abnormal   Collection Time: 11/16/16 12:56 PM  Result Value Ref Range   Lipase 53 (H) 11 - 51 U/L  Comprehensive metabolic panel  Status: Abnormal   Collection Time: 11/16/16 12:56 PM  Result Value Ref Range   Sodium 135 135 - 145 mmol/L   Potassium 3.8 3.5 - 5.1 mmol/L   Chloride 104 101 - 111 mmol/L   CO2 26 22 - 32 mmol/L   Glucose, Bld 115 (H) 65 - 99 mg/dL   BUN 14 6 - 20 mg/dL   Creatinine, Ser 0.82 0.61 - 1.24 mg/dL   Calcium 8.9 8.9 - 10.3 mg/dL   Total Protein 6.2 (L) 6.5 - 8.1 g/dL   Albumin 3.3 (L) 3.5 - 5.0 g/dL   AST 14 (L) 15 - 41 U/L   ALT 13 (L) 17 - 63 U/L   Alkaline Phosphatase 61  38 - 126 U/L   Total Bilirubin 0.5 0.3 - 1.2 mg/dL   GFR calc non Af Amer >60 >60 mL/min   GFR calc Af Amer >60 >60 mL/min    Comment: (NOTE) The eGFR has been calculated using the CKD EPI equation. This calculation has not been validated in all clinical situations. eGFR's persistently <60 mL/min signify possible Chronic Kidney Disease.    Anion gap 5 5 - 15  CBC     Status: Abnormal   Collection Time: 11/16/16 12:56 PM  Result Value Ref Range   WBC 7.7 4.0 - 10.5 K/uL   RBC 2.81 (L) 4.22 - 5.81 MIL/uL   Hemoglobin 7.2 (L) 13.0 - 17.0 g/dL   HCT 22.9 (L) 39.0 - 52.0 %   MCV 81.5 78.0 - 100.0 fL   MCH 25.6 (L) 26.0 - 34.0 pg   MCHC 31.4 30.0 - 36.0 g/dL   RDW 17.6 (H) 11.5 - 15.5 %   Platelets 158 150 - 400 K/uL  Urinalysis, Routine w reflex microscopic     Status: Abnormal   Collection Time: 11/16/16  1:29 PM  Result Value Ref Range   Color, Urine YELLOW YELLOW   APPearance CLEAR CLEAR   Specific Gravity, Urine 1.011 1.005 - 1.030   pH 7.0 5.0 - 8.0   Glucose, UA NEGATIVE NEGATIVE mg/dL   Hgb urine dipstick NEGATIVE NEGATIVE   Bilirubin Urine NEGATIVE NEGATIVE   Ketones, ur NEGATIVE NEGATIVE mg/dL   Protein, ur NEGATIVE NEGATIVE mg/dL   Nitrite NEGATIVE NEGATIVE   Leukocytes, UA TRACE (A) NEGATIVE   RBC / HPF 0-5 0 - 5 RBC/hpf   WBC, UA 0-5 0 - 5 WBC/hpf   Bacteria, UA RARE (A) NONE SEEN   Squamous Epithelial / LPF 0-5 (A) NONE SEEN   Mucous PRESENT   POC occult blood, ED RN will collect     Status: Abnormal   Collection Time: 11/16/16  1:37 PM  Result Value Ref Range   Fecal Occult Bld POSITIVE (A) NEGATIVE   Ct Abdomen Pelvis W Contrast  Result Date: 11/16/2016 CLINICAL DATA:  Left-sided abdominal pain for 2 days EXAM: CT ABDOMEN AND PELVIS WITH CONTRAST TECHNIQUE: Multidetector CT imaging of the abdomen and pelvis was performed using the standard protocol following bolus administration of intravenous contrast. CONTRAST:  156m ISOVUE-300 IOPAMIDOL (ISOVUE-300)  INJECTION 61% COMPARISON:  None. FINDINGS: Lower chest: Within normal limits. Hepatobiliary: The liver is within normal limits. Multiple gallstones are noted within the gallbladder. Pancreas: Distal pancreas demonstrates a calcification within. This may represent an obstructive stone and would correlate with the distal abnormality within the tail of the pancreas described below. Spleen: Within normal limits. Adrenals/Urinary Tract: The adrenal glands are unremarkable. The kidneys demonstrates some scattered small nonobstructing stones in the  right kidney. The collecting systems and ureters are within normal limits. The bladder is well distended. Stomach/Bowel: Diverticular change of the colon is noted with evidence of inflammatory changes near the splenic flexure. This is likely reactive to the adjacent fluid collection described below. The appendix is within normal limits. Vascular/Lymphatic: Aortic atherosclerosis. No enlarged abdominal or pelvic lymph nodes. Reproductive: Prostate is unremarkable. Other: Multiloculated fluid collection measuring approximately 8.8 by 4.2 by 4.0 cm . Small amount of fluid is noted adjacent to the posterior aspect of the stomach as well. It measures approximately 3.3 cm in greatest dimension. Musculoskeletal: No acute or significant osseous findings. IMPRESSION: Multiloculated fluid collection in the left upper quadrant beneath the spleen and adjacent to the tail of the pancreas and splenic flexure of the colon. Given the elevated lipase these changes likely represent focal pancreatitis and pseudocyst formation. Calcification is noted in the distal aspect of the pancreas likely representing an obstructing calculus. Electronically Signed   By: Inez Catalina M.D.   On: 11/16/2016 15:20    Assessment: 1. Recurrent subacute/occult apparent GI bleeding with complete endoscopic workup in the last 2 months unrevealing except for tiny red spot noted on capsule endoscopy 2. Abdominal  pain with abnormalities including a possible pancreatic duct calcification and a fluid collection between the spleen and adjacent to the tail of pancreas possibly representing a pseudocyst. 1. Plan: Supportive care and alcohol abstinence regarding pancreatic abnormalities. Will need repeat imaging at some point to follow possible developing pseudocyst for indications of need for drainage 2. Will probably pursue repeat EGD with enteroscopy although likely low yield in light of recent negative EGD and capsule endoscopy. We'll plan this for Monday. Will allow diet today.  Jarel Cuadra C 11/16/2016, 5:07 PM  Pager [phone] If no answer or after 5 PM call [phone]

## 2016-11-16 NOTE — H&P (Signed)
History and Physical    Barry Mccoy HQI:[id number] DOB: [birth year] DOA: 11/16/2016  PCP: Karis Juba, PA-C   I have briefly reviewed patients previous medical reports in [facility].  Patient coming from: Home  Chief Complaint: Black tarry stools, left-sided abdominal pain, dizziness and lightheadedness.  HPI: Barry Mccoy is a 66 year old male, lives with his daughter, independent of activities of daily living, PMH of alcohol abuse (quit ? 1 year ago), tobacco abuse (quit 2 months ago), anxiety, depression, stage II chronic kidney disease, recent couple of hospitalizations(1/22-2/13, 2/26-3/3), for acute GI bleed & symptomatic anemia, PRBC's transfusions, intubation and mechanical ventilation, alcohol withdrawal delirium, just discharged from the hospital on 11/10/16, presented to [facility] ED with above complaints. He has undergone extensive evaluation by GI including EGD, colonoscopy and capsule endoscopy in the last 2 months without clear source. Since returning home, he had couple days of reasonable health, brown-colored stools but over the last 48 hours, noted subacute onset of left-sided abdominal pain, chronic, throbbing, 5/10 in severity, with episodes of acute pain 9/10 where he has to double up and had an episode where he almost passed out before he went into PCPs office on 3/8. 3 nonbloody emesis 2 nights ago. Appetite was good up to 48 hours ago but since then his borderline. Has been having 1-2 episodes of black tarry stools for the last 3-4 days. Progressively weaker, dizzy and lightheadedness especially on upright position. Seen at PCPs office yesterday and as per family's report, patient had soft pressures in the 90s and hemoglobin was 8.4 and patient was referred to hematology and gastroenterology as outpatient. Due to worsening symptoms, patient and family decided to come to the ED today.  ED Course: Hemoglobin has dropped to 7.2 from 8.4 yesterday. CMP unremarkable.  Lipase minimally elevated. CT abdomen and pelvis with contrast: Suggestive of focal pancreatitis, pseudocyst formation and obstructing calculus in the distal aspect of the pancreas. Patient has received IV fluid bolus, pain management in the ED. [facility] consulted.  Review of Systems:  All other systems reviewed and apart from HPI, are negative.  Past Medical History:  Diagnosis Date  . Anxiety   . BPH (benign prostatic hyperplasia)   . Cervical spine disease   . Chronic kidney disease    kidney stones  . Depression   . Disorder of lumbar spine   . ETOH abuse   . GI bleed   . Muscle spasm     Past Surgical History:  Procedure Laterality Date  . COLONOSCOPY WITH PROPOFOL N/A 11/08/2016   Procedure: COLONOSCOPY WITH PROPOFOL;  Surgeon: Wilford Corner, MD;  Location: [facility];  Service: Endoscopy;  Laterality: N/A;  . ELBOW SURGERY     as child  . ELBOW SURGERY    . ESOPHAGOGASTRODUODENOSCOPY N/A 10/04/2016   Procedure: ESOPHAGOGASTRODUODENOSCOPY (EGD);  Surgeon: Carol Ada, MD;  Location: [place];  Service: Gastroenterology;  Laterality: N/A;  . GIVENS CAPSULE STUDY N/A 11/09/2016   Procedure: GIVENS CAPSULE STUDY;  Surgeon: Wilford Corner, MD;  Location: [facility];  Service: Endoscopy;  Laterality: N/A;    Social History  reports that he quit smoking about 3 weeks ago. His smoking use included Cigarettes. He has a 50.00 pack-year smoking history. He has never used smokeless tobacco. He reports that he does not drink alcohol or use drugs.  Allergies  Allergen Reactions  . Shellfish Allergy Other (See Comments)    Unknown     Family History  Problem Relation Age of Onset  .  Colon cancer Brother   . Alcohol abuse Brother   . Cancer Brother   . Early death Brother   . Prostate cancer Brother   . Colon polyps Brother   . Cancer Mother 84    colon cancer  . Colon cancer Mother      Prior to Admission medications   Medication Sig Start Date End Date  Taking? Authorizing Provider  acetaminophen (TYLENOL) 325 MG tablet Take 2 tablets (650 mg total) by mouth every 6 (six) hours as needed for mild pain, moderate pain or headache (or Fever >/= 101). 10/27/16  Yes Clanford Marisa Hua, MD  aspirin EC 81 MG tablet Take 1 tablet (81 mg total) by mouth every morning. 11/13/16  Yes Charlynne Cousins, MD  baclofen (LIORESAL) 20 MG tablet TAKE 1 TABLET BY MOUTH EVERY 12 HOURS AS NEEDED FOR SPASMS 10/30/16  Yes [name], PA-C  Cholecalciferol (VITAMIN D-3) 5000 units TABS Take 5,000 Units by mouth daily.    Yes Historical Provider, MD  folic acid (FOLVITE) 1 MG tablet Take 1 tablet (1 mg total) by mouth daily. Patient taking differently: Take 1 mg by mouth every morning.  10/28/16  Yes Clanford Marisa Hua, MD  lidocaine (LIDODERM) 5 % Place 1 patch onto the skin every 12 (twelve) hours as needed (pain).  09/12/16  Yes Historical Provider, MD  metoprolol tartrate (LOPRESSOR) 25 MG tablet Take 1 tablet (25 mg total) by mouth 2 (two) times daily. 10/30/16  Yes [name], PA-C  Multiple Vitamin (MULTIVITAMIN WITH MINERALS) TABS tablet Take 1 tablet by mouth at bedtime.   Yes Historical Provider, MD  OnabotulinumtoxinA (BOTOX IJ) Inject as directed See admin instructions. Botox injections done at Dr. Audery Amel office approximately every 90 days   Yes Historical Provider, MD  polyethylene glycol (MIRALAX / GLYCOLAX) packet Take 17 g by mouth daily as needed for mild constipation. 10/27/16  Yes Clanford Marisa Hua, MD  QUEtiapine (SEROQUEL) 50 MG tablet Take 1 tablet (50 mg total) by mouth at bedtime. 10/30/16  Yes [name], PA-C  tamsulosin (FLOMAX) 0.4 MG CAPS capsule Take 1 capsule (0.4 mg total) by mouth at bedtime. 10/30/16  Yes [name], PA-C  thiamine 100 MG tablet Take 1 tablet (100 mg total) by mouth daily. For 4days then down to 100mg  daily Patient taking differently: Take 250 mg by mouth daily.  10/27/16  Yes Clanford Marisa Hua, MD  pantoprazole  (PROTONIX) 40 MG tablet Take 1 tablet (40 mg total) by mouth daily. 10/30/16   Lonie Peak Dixon, PA-C  sertraline (ZOLOFT) 50 MG tablet Take 1 tablet (50 mg total) by mouth at bedtime. 10/30/16   [name], PA-C  traZODone (DESYREL) 100 MG tablet Take 1 tablet (100 mg total) by mouth at bedtime. 10/27/16   Clanford Marisa Hua, MD    Physical Exam: Vitals:   11/16/16 1437 11/16/16 1600 11/16/16 1630 11/16/16 1710  BP: 108/62 106/66 (!) 107/52 (!) 94/54  Pulse: 70   89  Resp: 16   16  Temp:      TempSrc:      SpO2: 100%   90%  Weight:      Height:          Constitutional: Pleasant middle-aged male, moderately built and nourished, lying comfortably propped up in the gurney in the ED without obvious distress. Eyes: PERTLA, lids and conjunctivae normal ENMT: Mucous membranes are dry. Posterior pharynx clear of any exudate or lesions. Normal dentition.  Neck: supple, no masses, no thyromegaly Respiratory: clear to auscultation bilaterally, no wheezing, no crackles. Normal respiratory effort. No accessory muscle use.  Cardiovascular: S1 & S2 heard, regular rate and rhythm, no murmurs / rubs / gallops. No extremity edema. 2+ pedal pulses. No carotid bruits.  Abdomen: Nondistended. Tender in the left upper and mid quadrants without rigidity, guarding or rebound. No hepatosplenomegaly or masses appreciated. Bowel sounds normal.  Musculoskeletal: no clubbing / cyanosis. No joint deformity upper and lower extremities. Good ROM, no contractures. Normal muscle tone.  Skin: no rashes, lesions, ulcers. No induration Neurologic: CN 2-12 grossly intact. Sensation intact, DTR normal. Strength 5/5 in all 4 limbs.  Psychiatric: Normal judgment and insight. Alert and oriented x 3. Normal mood.     Labs on Admission: I have personally reviewed following labs and imaging studies  CBC:  Recent Labs Lab 11/15/16 1037 11/16/16 1256  WBC 5.7 7.7  HGB 8.4* 7.2*  HCT 28.5* 22.9*  MCV 87.2 81.5  PLT 152  867   Basic Metabolic Panel:  Recent Labs Lab 11/16/16 1256  NA 135  K 3.8  CL 104  CO2 26  GLUCOSE 115*  BUN 14  CREATININE 0.82  CALCIUM 8.9   Liver Function Tests:  Recent Labs Lab 11/16/16 1256  AST 14*  ALT 13*  ALKPHOS 61  BILITOT 0.5  PROT 6.2*  ALBUMIN 3.3*   Urine analysis:    Component Value Date/Time   COLORURINE YELLOW 11/16/2016 1329   APPEARANCEUR CLEAR 11/16/2016 1329   LABSPEC 1.011 11/16/2016 1329   PHURINE 7.0 11/16/2016 1329   GLUCOSEU NEGATIVE 11/16/2016 1329   HGBUR NEGATIVE 11/16/2016 1329   BILIRUBINUR NEGATIVE 11/16/2016 1329   KETONESUR NEGATIVE 11/16/2016 1329   PROTEINUR NEGATIVE 11/16/2016 1329   NITRITE NEGATIVE 11/16/2016 1329   LEUKOCYTESUR TRACE (A) 11/16/2016 1329     Radiological Exams on Admission: Ct Abdomen Pelvis W Contrast  Result Date: 11/16/2016 CLINICAL DATA:  Left-sided abdominal pain for 2 days EXAM: CT ABDOMEN AND PELVIS WITH CONTRAST TECHNIQUE: Multidetector CT imaging of the abdomen and pelvis was performed using the standard protocol following bolus administration of intravenous contrast. CONTRAST:  122mL ISOVUE-300 IOPAMIDOL (ISOVUE-300) INJECTION 61% COMPARISON:  None. FINDINGS: Lower chest: Within normal limits. Hepatobiliary: The liver is within normal limits. Multiple gallstones are noted within the gallbladder. Pancreas: Distal pancreas demonstrates a calcification within. This may represent an obstructive stone and would correlate with the distal abnormality within the tail of the pancreas described below. Spleen: Within normal limits. Adrenals/Urinary Tract: The adrenal glands are unremarkable. The kidneys demonstrates some scattered small nonobstructing stones in the right kidney. The collecting systems and ureters are within normal limits. The bladder is well distended. Stomach/Bowel: Diverticular change of the colon is noted with evidence of inflammatory changes near the splenic flexure. This is likely reactive  to the adjacent fluid collection described below. The appendix is within normal limits. Vascular/Lymphatic: Aortic atherosclerosis. No enlarged abdominal or pelvic lymph nodes. Reproductive: Prostate is unremarkable. Other: Multiloculated fluid collection measuring approximately 8.8 by 4.2 by 4.0 cm . Small amount of fluid is noted adjacent to the posterior aspect of the stomach as well. It measures approximately 3.3 cm in greatest dimension. Musculoskeletal: No acute or significant osseous findings. IMPRESSION: Multiloculated fluid collection in the left upper quadrant beneath the spleen and adjacent to the tail of the pancreas and splenic flexure of the colon. Given the elevated lipase these changes likely represent focal pancreatitis and pseudocyst formation. Calcification is noted  in the distal aspect of the pancreas likely representing an obstructing calculus. Electronically Signed   By: Inez Catalina M.D.   On: 11/16/2016 15:20    EKG: None done this admission. Will order.  Assessment/Plan Principal Problem:   Acute GI bleeding Active Problems:   Acute blood loss anemia     1. Subacute GI bleed: Has been extensively evaluated by [facility] including EGD, colonoscopy and capsule endoscopy over the 2 prior admissions in the last 2 months without clear source. Patient had couple days of normal brown color stools after recent discharge but then started having melena with drop in hemoglobin. Nothing by mouth. DC aspirin. IV PPI twice a day. [facility] consulted.? Related to portal gastropathy versus inflammatory gastritis seen on EGD 10/04/16.? Repeat EGD versus tagged RBC scan. 2. Acute blood loss anemia, symptomatic: Hemoglobin has dropped from 8 g range after recent discharge to 7.2 on this admission. Transfuse 1 unit PRBC. Follow CBCs closely and aim to keep hemoglobin greater than 8. Avoid over transfusion so as to not make gastropathy versus. 3. LUQ abdominal pain/possible focal pancreatitis with  pseudocyst formation and obstructing calculus: New onset abdominal pain 48 hours prior to admission. CT abdomen findings as above. Lipase is only minimally elevated. Treat with bowel rest, IV fluids and pain management. Await GI input. 4. History of alcohol dependence: As per patient and daughter at bedside, he has not consumed alcohol in about a year's time. Continue multivitamins. 5. History of tobacco abuse: States that he quit smoking on 10/01/16. Congratulated him on this. 6. Essential hypertension: Soft blood pressures. Continue metoprolol with holding parameters. 7. BPH: Continue home medications.   DVT prophylaxis: SCDs  Code Status: Full Family Communication: Discussed in detail with patient's daughter at bedside. Updated care and answered questions.  Disposition Plan: DC home when medically stable.  Consults called: [facility]. Discussed with Dr. Oletta Lamas.  Admission status: Observation, stepdown unit.    [facility] MD [facility] Pager [phone]  If 7PM-7AM, please contact night-coverage [email or web address] Password [email or web address]  11/16/2016, 5:20 PM

## 2016-11-16 NOTE — ED Notes (Signed)
Pt complaining of headache  

## 2016-11-16 NOTE — Progress Notes (Deleted)
Patient currently and CT scan with recurrent subacute bleeding. Has had EGD colonoscopy and capsule endoscopy within the last 2 months all of which been unrevealing. Will see tomorrow.

## 2016-11-16 NOTE — ED Provider Notes (Signed)
[facility] DEPT Provider Note   CSN: [id number] Arrival date & time: 11/16/16  1214     History   Chief Complaint Chief Complaint  Patient presents with  . Abdominal Pain    HPI Barry Mccoy is a 66 y.o. male.  The history is provided by the patient.  Abdominal Pain   This is a new problem. The current episode started 2 days ago. Episode frequency: intermittent. The problem has not changed since onset.The pain is located in the LUQ. The pain is moderate. Associated symptoms include nausea and vomiting (nbnb). Pertinent negatives include fever and diarrhea. Associated symptoms comments: Black, formed stool. The symptoms are aggravated by certain positions. Nothing relieves the symptoms. Past workup includes GI consult.    Past Medical History:  Diagnosis Date  . Anxiety   . BPH (benign prostatic hyperplasia)   . Cervical spine disease   . Chronic kidney disease    kidney stones  . Depression   . Disorder of lumbar spine   . ETOH abuse   . GI bleed   . Muscle spasm     Patient Active Problem List   Diagnosis Date Noted  . GI bleed 11/06/2016  . Acute blood loss anemia 11/06/2016  . Nausea with vomiting 11/06/2016  . Alcohol abuse 11/06/2016  . UTI (urinary tract infection) 10/23/2016  . Urinary retention 10/23/2016  . Positive D dimer   . Severe single current episode of major depressive disorder, with psychotic features ([place])   . Normocytic anemia 10/01/2016  . Acute GI bleeding 10/01/2016  . Grief reaction 04/19/2016  . Depression 04/19/2016  . BPH (benign prostatic hyperplasia) 04/19/2016  . Testosterone deficiency 10/26/2015  . Vitamin D deficiency 10/21/2015  . Smoker 10/17/2015  . Chronic pain syndrome 10/17/2015  . Anxiety   . Cervical spine disease   . Disorder of lumbar spine     Past Surgical History:  Procedure Laterality Date  . COLONOSCOPY WITH PROPOFOL N/A 11/08/2016   Procedure: COLONOSCOPY WITH PROPOFOL;  Surgeon: Wilford Corner, MD;   Location: [facility];  Service: Endoscopy;  Laterality: N/A;  . ELBOW SURGERY     as child  . ELBOW SURGERY    . ESOPHAGOGASTRODUODENOSCOPY N/A 10/04/2016   Procedure: ESOPHAGOGASTRODUODENOSCOPY (EGD);  Surgeon: Carol Ada, MD;  Location: [facility];  Service: Gastroenterology;  Laterality: N/A;  . GIVENS CAPSULE STUDY N/A 11/09/2016   Procedure: GIVENS CAPSULE STUDY;  Surgeon: Wilford Corner, MD;  Location: [facility];  Service: Endoscopy;  Laterality: N/A;       Home Medications    Prior to Admission medications   Medication Sig Start Date End Date Taking? Authorizing Provider  acetaminophen (TYLENOL) 325 MG tablet Take 2 tablets (650 mg total) by mouth every 6 (six) hours as needed for mild pain, moderate pain or headache (or Fever >/= 101). 10/27/16  Yes Clanford Marisa Hua, MD  aspirin EC 81 MG tablet Take 1 tablet (81 mg total) by mouth every morning. 11/13/16  Yes Charlynne Cousins, MD  baclofen (LIORESAL) 20 MG tablet TAKE 1 TABLET BY MOUTH EVERY 12 HOURS AS NEEDED FOR SPASMS 10/30/16  Yes [name], PA-C  Cholecalciferol (VITAMIN D-3) 5000 units TABS Take 5,000 Units by mouth daily.    Yes Historical Provider, MD  folic acid (FOLVITE) 1 MG tablet Take 1 tablet (1 mg total) by mouth daily. Patient taking differently: Take 1 mg by mouth every morning.  10/28/16  Yes Clanford Marisa Hua, MD  lidocaine (LIDODERM) 5 % Place  1 patch onto the skin every 12 (twelve) hours as needed (pain).  09/12/16  Yes Historical Provider, MD  metoprolol tartrate (LOPRESSOR) 25 MG tablet Take 1 tablet (25 mg total) by mouth 2 (two) times daily. 10/30/16  Yes [name], PA-C  Multiple Vitamin (MULTIVITAMIN WITH MINERALS) TABS tablet Take 1 tablet by mouth at bedtime.   Yes Historical Provider, MD  OnabotulinumtoxinA (BOTOX IJ) Inject as directed See admin instructions. Botox injections done at Dr. Audery Amel office approximately every 90 days   Yes Historical Provider, MD  polyethylene glycol (MIRALAX  / GLYCOLAX) packet Take 17 g by mouth daily as needed for mild constipation. 10/27/16  Yes Clanford Marisa Hua, MD  QUEtiapine (SEROQUEL) 50 MG tablet Take 1 tablet (50 mg total) by mouth at bedtime. 10/30/16  Yes [name], PA-C  tamsulosin (FLOMAX) 0.4 MG CAPS capsule Take 1 capsule (0.4 mg total) by mouth at bedtime. 10/30/16  Yes [name], PA-C  thiamine 100 MG tablet Take 1 tablet (100 mg total) by mouth daily. For 4days then down to 100mg  daily Patient taking differently: Take 250 mg by mouth daily.  10/27/16  Yes Clanford Marisa Hua, MD  pantoprazole (PROTONIX) 40 MG tablet Take 1 tablet (40 mg total) by mouth daily. 10/30/16   Lonie Peak Dixon, PA-C  sertraline (ZOLOFT) 50 MG tablet Take 1 tablet (50 mg total) by mouth at bedtime. 10/30/16   [name], PA-C  traZODone (DESYREL) 100 MG tablet Take 1 tablet (100 mg total) by mouth at bedtime. 10/27/16   Clanford Marisa Hua, MD    Family History Family History  Problem Relation Age of Onset  . Colon cancer Brother   . Alcohol abuse Brother   . Cancer Brother   . Early death Brother   . Prostate cancer Brother   . Colon polyps Brother   . Cancer Mother 50    colon cancer  . Colon cancer Mother     Social History Social History  Substance Use Topics  . Smoking status: Former Smoker    Packs/day: 1.00    Years: 50.00    Types: Cigarettes    Quit date: 10/23/2016  . Smokeless tobacco: Never Used     Comment: attempting to quit- down to 8 a day   . Alcohol use No     Comment: 2 drinks a month     Allergies   Shellfish allergy   Review of Systems Review of Systems  Constitutional: Negative for fever.  Gastrointestinal: Positive for abdominal pain, nausea and vomiting (nbnb). Negative for diarrhea.  Ten systems are reviewed and are negative for acute change except as noted in the HPI    Physical Exam Updated Vital Signs BP (!) 122/54   Pulse 89   Temp 97.8 F (36.6 C) (Oral)   Resp 16   Ht 6\' 2"  (1.88 m)   Wt 211  lb (95.7 kg)   SpO2 90%   BMI 27.09 kg/m   Physical Exam  Constitutional: He is oriented to person, place, and time. He appears well-developed and well-nourished. No distress.  HENT:  Head: Normocephalic and atraumatic.  Nose: Nose normal.  Eyes: Conjunctivae and EOM are normal. Pupils are equal, round, and reactive to light. Right eye exhibits no discharge. Left eye exhibits no discharge. No scleral icterus.  Neck: Normal range of motion. Neck supple.  Cardiovascular: Normal rate and regular rhythm.  Exam reveals no gallop and no friction rub.   No murmur heard.  Pulmonary/Chest: Effort normal and breath sounds normal. No stridor. No respiratory distress. He has no rales.  Abdominal: Soft. He exhibits no distension. There is tenderness in the left upper quadrant and left lower quadrant. There is rebound and CVA tenderness (left). There is no rigidity.  Musculoskeletal: He exhibits no edema or tenderness.  Neurological: He is alert and oriented to person, place, and time.  Skin: Skin is warm and dry. No rash noted. He is not diaphoretic. No erythema.  Psychiatric: He has a normal mood and affect.  Vitals reviewed.    ED Treatments / Results  Labs (all labs ordered are listed, but only abnormal results are displayed) Labs Reviewed  LIPASE, BLOOD - Abnormal; Notable for the following:       Result Value   Lipase 53 (*)    All other components within normal limits  COMPREHENSIVE METABOLIC PANEL - Abnormal; Notable for the following:    Glucose, Bld 115 (*)    Total Protein 6.2 (*)    Albumin 3.3 (*)    AST 14 (*)    ALT 13 (*)    All other components within normal limits  CBC - Abnormal; Notable for the following:    RBC 2.81 (*)    Hemoglobin 7.2 (*)    HCT 22.9 (*)    MCH 25.6 (*)    RDW 17.6 (*)    All other components within normal limits  URINALYSIS, ROUTINE W REFLEX MICROSCOPIC - Abnormal; Notable for the following:    Leukocytes, UA TRACE (*)    Bacteria, UA RARE  (*)    Squamous Epithelial / LPF 0-5 (*)    All other components within normal limits  POC OCCULT BLOOD, ED - Abnormal; Notable for the following:    Fecal Occult Bld POSITIVE (*)    All other components within normal limits  LIPASE, BLOOD  PREPARE RBC (CROSSMATCH)  TYPE AND SCREEN    EKG  EKG Interpretation None       Radiology Ct Abdomen Pelvis W Contrast  Result Date: 11/16/2016 CLINICAL DATA:  Left-sided abdominal pain for 2 days EXAM: CT ABDOMEN AND PELVIS WITH CONTRAST TECHNIQUE: Multidetector CT imaging of the abdomen and pelvis was performed using the standard protocol following bolus administration of intravenous contrast. CONTRAST:  153mL ISOVUE-300 IOPAMIDOL (ISOVUE-300) INJECTION 61% COMPARISON:  None. FINDINGS: Lower chest: Within normal limits. Hepatobiliary: The liver is within normal limits. Multiple gallstones are noted within the gallbladder. Pancreas: Distal pancreas demonstrates a calcification within. This may represent an obstructive stone and would correlate with the distal abnormality within the tail of the pancreas described below. Spleen: Within normal limits. Adrenals/Urinary Tract: The adrenal glands are unremarkable. The kidneys demonstrates some scattered small nonobstructing stones in the right kidney. The collecting systems and ureters are within normal limits. The bladder is well distended. Stomach/Bowel: Diverticular change of the colon is noted with evidence of inflammatory changes near the splenic flexure. This is likely reactive to the adjacent fluid collection described below. The appendix is within normal limits. Vascular/Lymphatic: Aortic atherosclerosis. No enlarged abdominal or pelvic lymph nodes. Reproductive: Prostate is unremarkable. Other: Multiloculated fluid collection measuring approximately 8.8 by 4.2 by 4.0 cm . Small amount of fluid is noted adjacent to the posterior aspect of the stomach as well. It measures approximately 3.3 cm in greatest  dimension. Musculoskeletal: No acute or significant osseous findings. IMPRESSION: Multiloculated fluid collection in the left upper quadrant beneath the spleen and adjacent to the tail of the pancreas and  splenic flexure of the colon. Given the elevated lipase these changes likely represent focal pancreatitis and pseudocyst formation. Calcification is noted in the distal aspect of the pancreas likely representing an obstructing calculus. Electronically Signed   By: Inez Catalina M.D.   On: 11/16/2016 15:20    Procedures Procedures (including critical care time)  Medications Ordered in ED Medications  iopamidol (ISOVUE-300) 61 % injection (not administered)  0.9 %  sodium chloride infusion (not administered)  sodium chloride 0.9 % bolus 1,000 mL (0 mLs Intravenous Stopped 11/16/16 1458)  famotidine (PEPCID) IVPB 20 mg premix (0 mg Intravenous Stopped 11/16/16 1357)  morphine 4 MG/ML injection 4 mg (4 mg Intravenous Given 11/16/16 1322)  ondansetron (ZOFRAN) injection 4 mg (4 mg Intravenous Given 11/16/16 1320)  iopamidol (ISOVUE-300) 61 % injection 100 mL (100 mLs Intravenous Contrast Given 11/16/16 1451)  morphine 4 MG/ML injection 4 mg (4 mg Intravenous Given 11/16/16 1540)  sodium chloride 0.9 % bolus 1,000 mL (0 mLs Intravenous Stopped 11/16/16 1708)     Initial Impression / Assessment and Plan / ED Course  I have reviewed the triage vital signs and the nursing notes.  Pertinent labs & imaging results that were available during my care of the patient were reviewed by me and considered in my medical decision making (see chart for details).     Workup positive for occult GI bleed. Hemoglobin with 1 g drop since yesterday. Abdominal tenderness likely secondary from pancreatic tail pancreatitis. Provided with IV fluids and pain medicine. We'll transfuse one unit of blood in the emergency department. Patient will require admission for further workup and management.     Final Clinical Impressions(s) /  ED Diagnoses   Final diagnoses:  Gastrointestinal hemorrhage with melena  Other acute pancreatitis without infection or necrosis     Fatima Blank, MD 11/16/16 1740

## 2016-11-17 DIAGNOSIS — K858 Other acute pancreatitis without necrosis or infection: Secondary | ICD-10-CM | POA: Diagnosis not present

## 2016-11-17 DIAGNOSIS — Z87442 Personal history of urinary calculi: Secondary | ICD-10-CM | POA: Diagnosis not present

## 2016-11-17 DIAGNOSIS — Z811 Family history of alcohol abuse and dependence: Secondary | ICD-10-CM | POA: Diagnosis not present

## 2016-11-17 DIAGNOSIS — Z8042 Family history of malignant neoplasm of prostate: Secondary | ICD-10-CM | POA: Diagnosis not present

## 2016-11-17 DIAGNOSIS — K922 Gastrointestinal hemorrhage, unspecified: Secondary | ICD-10-CM | POA: Diagnosis not present

## 2016-11-17 DIAGNOSIS — I129 Hypertensive chronic kidney disease with stage 1 through stage 4 chronic kidney disease, or unspecified chronic kidney disease: Secondary | ICD-10-CM | POA: Diagnosis present

## 2016-11-17 DIAGNOSIS — Z720 Tobacco use: Secondary | ICD-10-CM

## 2016-11-17 DIAGNOSIS — D62 Acute posthemorrhagic anemia: Secondary | ICD-10-CM | POA: Diagnosis not present

## 2016-11-17 DIAGNOSIS — I471 Supraventricular tachycardia: Secondary | ICD-10-CM | POA: Diagnosis not present

## 2016-11-17 DIAGNOSIS — F329 Major depressive disorder, single episode, unspecified: Secondary | ICD-10-CM | POA: Diagnosis not present

## 2016-11-17 DIAGNOSIS — N182 Chronic kidney disease, stage 2 (mild): Secondary | ICD-10-CM | POA: Diagnosis present

## 2016-11-17 DIAGNOSIS — I10 Essential (primary) hypertension: Secondary | ICD-10-CM

## 2016-11-17 DIAGNOSIS — N400 Enlarged prostate without lower urinary tract symptoms: Secondary | ICD-10-CM | POA: Diagnosis not present

## 2016-11-17 DIAGNOSIS — Z7982 Long term (current) use of aspirin: Secondary | ICD-10-CM | POA: Diagnosis not present

## 2016-11-17 DIAGNOSIS — F17200 Nicotine dependence, unspecified, uncomplicated: Secondary | ICD-10-CM | POA: Diagnosis not present

## 2016-11-17 DIAGNOSIS — K921 Melena: Secondary | ICD-10-CM | POA: Diagnosis present

## 2016-11-17 DIAGNOSIS — K625 Hemorrhage of anus and rectum: Secondary | ICD-10-CM | POA: Diagnosis not present

## 2016-11-17 DIAGNOSIS — F1010 Alcohol abuse, uncomplicated: Secondary | ICD-10-CM | POA: Diagnosis not present

## 2016-11-17 DIAGNOSIS — K3189 Other diseases of stomach and duodenum: Secondary | ICD-10-CM | POA: Diagnosis not present

## 2016-11-17 DIAGNOSIS — Z87891 Personal history of nicotine dependence: Secondary | ICD-10-CM | POA: Diagnosis not present

## 2016-11-17 DIAGNOSIS — Z800 Family history of malignant neoplasm of digestive organs: Secondary | ICD-10-CM | POA: Diagnosis not present

## 2016-11-17 DIAGNOSIS — F419 Anxiety disorder, unspecified: Secondary | ICD-10-CM | POA: Diagnosis present

## 2016-11-17 DIAGNOSIS — K863 Pseudocyst of pancreas: Secondary | ICD-10-CM | POA: Diagnosis not present

## 2016-11-17 LAB — CBC
HCT: 24.4 % — ABNORMAL LOW (ref 39.0–52.0)
HCT: 24.9 % — ABNORMAL LOW (ref 39.0–52.0)
HCT: 27.9 % — ABNORMAL LOW (ref 39.0–52.0)
Hemoglobin: 7.5 g/dL — ABNORMAL LOW (ref 13.0–17.0)
Hemoglobin: 7.8 g/dL — ABNORMAL LOW (ref 13.0–17.0)
Hemoglobin: 8.9 g/dL — ABNORMAL LOW (ref 13.0–17.0)
MCH: 24.7 pg — ABNORMAL LOW (ref 26.0–34.0)
MCH: 25.6 pg — ABNORMAL LOW (ref 26.0–34.0)
MCH: 26.3 pg (ref 26.0–34.0)
MCHC: 30.7 g/dL (ref 30.0–36.0)
MCHC: 31.3 g/dL (ref 30.0–36.0)
MCHC: 31.9 g/dL (ref 30.0–36.0)
MCV: 80.3 fL (ref 78.0–100.0)
MCV: 81.6 fL (ref 78.0–100.0)
MCV: 82.3 fL (ref 78.0–100.0)
PLATELETS: 138 10*3/uL — AB (ref 150–400)
Platelets: 152 10*3/uL (ref 150–400)
Platelets: 152 10*3/uL (ref 150–400)
RBC: 3.04 MIL/uL — ABNORMAL LOW (ref 4.22–5.81)
RBC: 3.05 MIL/uL — ABNORMAL LOW (ref 4.22–5.81)
RBC: 3.39 MIL/uL — AB (ref 4.22–5.81)
RDW: 17.5 % — ABNORMAL HIGH (ref 11.5–15.5)
RDW: 17 % — ABNORMAL HIGH (ref 11.5–15.5)
RDW: 17.1 % — AB (ref 11.5–15.5)
WBC: 8 10*3/uL (ref 4.0–10.5)
WBC: 8.1 10*3/uL (ref 4.0–10.5)
WBC: 8.6 10*3/uL (ref 4.0–10.5)

## 2016-11-17 LAB — COMPREHENSIVE METABOLIC PANEL
ALBUMIN: 3.1 g/dL — AB (ref 3.5–5.0)
ALK PHOS: 63 U/L (ref 38–126)
ALT: 13 U/L — AB (ref 17–63)
ANION GAP: 4 — AB (ref 5–15)
AST: 17 U/L (ref 15–41)
BUN: 10 mg/dL (ref 6–20)
CALCIUM: 8.4 mg/dL — AB (ref 8.9–10.3)
CHLORIDE: 105 mmol/L (ref 101–111)
CO2: 25 mmol/L (ref 22–32)
Creatinine, Ser: 0.86 mg/dL (ref 0.61–1.24)
GFR calc Af Amer: >60 mL/min (ref >60)
GFR calc non Af Amer: >60 mL/min (ref >60)
GLUCOSE: 116 mg/dL — AB (ref 65–99)
Potassium: 4 mmol/L (ref 3.5–5.1)
SODIUM: 134 mmol/L — AB (ref 135–145)
Total Bilirubin: 1 mg/dL (ref 0.3–1.2)
Total Protein: 5.8 g/dL — ABNORMAL LOW (ref 6.5–8.1)

## 2016-11-17 LAB — RETICULOCYTES
RBC.: 2.86 MIL/uL — ABNORMAL LOW (ref 4.22–5.81)
RETIC COUNT ABSOLUTE: 120.1 10*3/uL (ref 19.0–186.0)
Retic Ct Pct: 4.2 % — ABNORMAL HIGH (ref 0.4–3.1)

## 2016-11-17 LAB — LIPASE, BLOOD: LIPASE: 32 U/L (ref 11–51)

## 2016-11-17 LAB — VITAMIN B12: Vitamin B-12: 248 pg/mL (ref 180–914)

## 2016-11-17 LAB — FOLATE: Folate: 59.4 ng/mL (ref >5.9)

## 2016-11-17 LAB — IRON AND TIBC
IRON: 14 ug/dL — AB (ref 45–182)
Saturation Ratios: 4 % — ABNORMAL LOW (ref 17.9–39.5)
TIBC: 358 ug/dL (ref 250–450)
UIBC: 344 ug/dL

## 2016-11-17 LAB — FERRITIN: FERRITIN: 23 ng/mL — AB (ref 24–336)

## 2016-11-17 LAB — MRSA PCR SCREENING: MRSA BY PCR: NEGATIVE

## 2016-11-17 MED ORDER — TRAZODONE HCL 100 MG PO TABS
100.0000 mg | ORAL_TABLET | Freq: Every day | ORAL | Status: DC
  Administered 2016-11-17 – 2016-11-20 (×5): 100 mg via ORAL
  Filled 2016-11-17: qty 2
  Filled 2016-11-17 (×3): qty 1
  Filled 2016-11-17: qty 2

## 2016-11-17 MED ORDER — METOPROLOL TARTRATE 12.5 MG HALF TABLET
12.5000 mg | ORAL_TABLET | Freq: Two times a day (BID) | ORAL | Status: DC
  Administered 2016-11-17 – 2016-11-20 (×5): 12.5 mg via ORAL
  Filled 2016-11-17 (×6): qty 1

## 2016-11-17 MED ORDER — SODIUM CHLORIDE 0.9 % IV SOLN
INTRAVENOUS | Status: AC
  Administered 2016-11-17: 13:00:00 via INTRAVENOUS

## 2016-11-17 MED ORDER — MORPHINE SULFATE (PF) 4 MG/ML IV SOLN
3.0000 mg | Freq: Four times a day (QID) | INTRAVENOUS | Status: DC | PRN
  Administered 2016-11-17 – 2016-11-20 (×12): 3 mg via INTRAVENOUS
  Filled 2016-11-17 (×13): qty 1

## 2016-11-17 MED ORDER — SODIUM CHLORIDE 0.9 % IV SOLN
Freq: Once | INTRAVENOUS | Status: AC
  Administered 2016-11-17: 09:00:00 via INTRAVENOUS

## 2016-11-17 MED ORDER — SODIUM CHLORIDE 0.9 % IV SOLN
510.0000 mg | Freq: Once | INTRAVENOUS | Status: AC
  Administered 2016-11-17: 510 mg via INTRAVENOUS
  Filled 2016-11-17: qty 17

## 2016-11-17 NOTE — Progress Notes (Signed)
MEDICATION RELATED CONSULT NOTE - INITIAL   Pharmacy Consult for Iron Replacement Indication: anemia  Allergies  Allergen Reactions  . Shellfish Allergy Other (See Comments)    Unknown     Patient Measurements: Height: 6\' 2"  (188 cm) Weight: 214 lb 1.1 oz (97.1 kg) IBW/kg (Calculated) : 82.2  Vital Signs: Temp: 98.5 F (36.9 C) (03/10 1140) Temp Source: Oral (03/10 1140) BP: 86/52 (03/10 1200) Pulse Rate: 70 (03/10 1200) Intake/Output from previous day: 03/09 0701 - 03/10 0700 In: 2447.8 [I.V.:777.8; Blood:670; IV Piggyback:1000] Out: 1000 [Urine:1000] Intake/Output from this shift: Total I/O In: 605 [I.V.:235; Blood:370] Out: 400 [Urine:400]  Labs:  Recent Labs  11/16/16 1256 11/16/16 2337 11/17/16 0430  WBC 7.7 8.1 8.0  HGB 7.2* 7.5* 7.8*  HCT 22.9* 24.4* 24.9*  PLT 158 152 152  CREATININE 0.82  --  0.86  ALBUMIN 3.3*  --  3.1*  PROT 6.2*  --  5.8*  AST 14*  --  17  ALT 13*  --  13*  ALKPHOS 61  --  63  BILITOT 0.5  --  1.0   Estimated Creatinine Clearance: 96.9 mL/min (by C-G formula based on SCr of 0.86 mg/dL).   Microbiology: Recent Results (from the past 720 hour(s))  Culture, Urine     Status: Abnormal   Collection Time: 10/23/16  6:50 PM  Result Value Ref Range Status   Specimen Description URINE, CLEAN CATCH  Final   Special Requests NONE  Final   Culture (A)  Final    >=100,000 COLONIES/mL STAPHYLOCOCCUS SPECIES (COAGULASE NEGATIVE)   Report Status 10/26/2016 FINAL  Final   Organism ID, Bacteria STAPHYLOCOCCUS SPECIES (COAGULASE NEGATIVE) (A)  Final      Susceptibility   Staphylococcus species (coagulase negative) - MIC*    CIPROFLOXACIN <=0.5 SENSITIVE Sensitive     GENTAMICIN <=0.5 SENSITIVE Sensitive     NITROFURANTOIN <=16 SENSITIVE Sensitive     OXACILLIN <=0.25 SENSITIVE Sensitive     TETRACYCLINE <=1 SENSITIVE Sensitive     VANCOMYCIN 1 SENSITIVE Sensitive     TRIMETH/SULFA <=10 SENSITIVE Sensitive     CLINDAMYCIN <=0.25  SENSITIVE Sensitive     RIFAMPIN <=0.5 SENSITIVE Sensitive     Inducible Clindamycin NEGATIVE Sensitive     * >=100,000 COLONIES/mL STAPHYLOCOCCUS SPECIES (COAGULASE NEGATIVE)  Urine culture     Status: None   Collection Time: 11/05/16 11:23 AM  Result Value Ref Range Status   Organism ID, Bacteria NO GROWTH  Final  MRSA PCR Screening     Status: None   Collection Time: 11/17/16 12:34 AM  Result Value Ref Range Status   MRSA by PCR NEGATIVE NEGATIVE Final    Comment:        The GeneXpert MRSA Assay (FDA approved for NASAL specimens only), is one component of a comprehensive MRSA colonization surveillance program. It is not intended to diagnose MRSA infection nor to guide or monitor treatment for MRSA infections.     Medical History: Past Medical History:  Diagnosis Date  . Anxiety   . BPH (benign prostatic hyperplasia)   . Cervical spine disease   . Chronic kidney disease    kidney stones  . Depression   . Disorder of lumbar spine   . ETOH abuse   . GI bleed   . Muscle spasm     Assessment: 66 yo M admitted with acute GI Bleed and symptomatic anemia.  Hx of EtOH abuse.   Per [facility] patient has a subacute GI Bleed  and work-up over past few months have been unrevealing.   He is receiving 2nd unit PRBC for Hg 7.8 today.  Goal of Therapy:  Hg >10, resolution of sx  Plan:  Check baseline anemia panel Give Feraheme 510mg  IV x1 today Evaluate anemia panel- may give 2nd dose of Feraheme in 1 week if warranted  Netta Cedars, PharmD, BCPS Pager: [phone] 11/17/2016,2:43 PM

## 2016-11-17 NOTE — Progress Notes (Signed)
[facility] PROGRESS NOTE  Barry Mccoy ZES:[id number] DOB: [birth year] DOA: 11/16/2016 PCP: Karis Juba, PA-C  Interim summary and HPI 66 year old male, lives with his daughter, independent of activities of daily living, PMH of alcohol abuse (quit ? 1 year ago), tobacco abuse (quit 2 months ago), anxiety, depression, stage II chronic kidney disease, recent couple of hospitalizations(1/22-2/13, 2/26-3/3), for acute GI bleed & symptomatic anemia; who presented with abd pain, black stools and symptomatic anemia symptoms (lightheadedness, fatigue/weakness and dizziness).  Assessment/Plan: 1. Subacute GI bleed: Has been extensively evaluated by [facility] including EGD, colonoscopy and capsule endoscopy over the 2 prior admissions in the last 2 months without clear source. Will wait on GI consult and follow rec's. will continue IV PPI twice a day. ? Related to portal gastropathy versus inflammatory gastritis seen on EGD 10/04/16.? Repeat EGD versus other test. For now will continue supportive care and transfuse as needed. 2. Acute blood loss anemia, symptomatic: recent extensive work up and no source identified. S/p 1 unit PRBC's. Hgb still low and patient with dark stools overnight. Will transfuse 1 more unit of PRBC's today and provide IV iron. Will follow GI rec's and clinical response. 3. LUQ abdominal pain/possible focal pancreatitis with pseudocyst formation and obstructing calculus: New onset abdominal pain 48 hours prior to admission. 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 Lipase was only minimally elevated, now WNL.. Continue bowel rest, IV fluids and judicious pain management. Will await GI input. 4. History of alcohol dependence: As per patient, alcohol free for over a year now. Will Continue multivitamins. 5. History of tobacco abuse: States that he quit smoking on 10/01/16. Encourage to keep himself smoking  free. 6. Essential hypertension: Soft blood pressures. Continue metoprolol (adjusted dose) with holding parameters. 7. BPH: Continue home medications. No signs of symptoms for urinary retention.  Code Status: Full Family Communication: none at bedside  Disposition Plan: remains in stepdown; transfuse another unit of PRBC's (symptomatic anemia, active bleeding); follow vital signs and electrolytes closely. Will ask pharmacy for IV iron.   Consultants:  [facility] (Dr. Amedeo Plenty coming to see him)  Procedures:  See below for x-ray reports   Antibiotics:  None   HPI/Subjective: Patient with soft BP, tachycardic and complaining of left quadrant abd discomfort. No nausea, no vomiting. Dark stools overnight,   Objective: Vitals:   11/17/16 0600 11/17/16 0752  BP: (!) 107/59   Pulse: 96   Resp: (!) 23   Temp:  98.4 F (36.9 C)    Intake/Output Summary (Last 24 hours) at 11/17/16 0837 Last data filed at 11/17/16 0700  Gross per 24 hour  Intake          2447.83 ml  Output             1000 ml  Net          1447.83 ml   Filed Weights   11/16/16 1224 11/16/16 2307  Weight: 95.7 kg (211 lb) 97.1 kg (214 lb 1.1 oz)    Exam:   General:  Afebrile, complaining of abd pain (left quadrant), no CP or SOB. Patient is hungry and reported having some small very dark smearly stool overnight.  Cardiovascular: S1, tachycardic, no rubs, no gallops, no JVD  Respiratory: good air movement, no wheezing, no crackles  Abdomen: soft, NT, ND, positive BS  Musculoskeletal: no edema or cyanosis   Data Reviewed: Basic Metabolic Panel:  Recent Labs Lab 11/16/16 1256 11/17/16 0430  NA 135  134*  K 3.8 4.0  CL 104 105  CO2 26 25  GLUCOSE 115* 116*  BUN 14 10  CREATININE 0.82 0.86  CALCIUM 8.9 8.4*   Liver Function Tests:  Recent Labs Lab 11/16/16 1256 11/17/16 0430  AST 14* 17  ALT 13* 13*  ALKPHOS 61 63  BILITOT 0.5 1.0  PROT 6.2* 5.8*  ALBUMIN 3.3* 3.1*    Recent Labs Lab  11/16/16 1256 11/17/16 0430  LIPASE 53* 32   CBC:  Recent Labs Lab 11/15/16 1037 11/16/16 1256 11/16/16 2337 11/17/16 0430  WBC 5.7 7.7 8.1 8.0  HGB 8.4* 7.2* 7.5* 7.8*  HCT 28.5* 22.9* 24.4* 24.9*  MCV 87.2 81.5 80.3 81.6  PLT 152 158 152 152   CBG: No results for input(s): GLUCAP in the last 168 hours.  Recent Results (from the past 240 hour(s))  MRSA PCR Screening     Status: None   Collection Time: 11/17/16 12:34 AM  Result Value Ref Range Status   MRSA by PCR NEGATIVE NEGATIVE Final    Comment:        The GeneXpert MRSA Assay (FDA approved for NASAL specimens only), is one component of a comprehensive MRSA colonization surveillance program. It is not intended to diagnose MRSA infection nor to guide or monitor treatment for MRSA infections.      Studies: Ct Abdomen Pelvis W Contrast  Result Date: 11/16/2016 CLINICAL DATA:  Left-sided abdominal pain for 2 days EXAM: CT ABDOMEN AND PELVIS WITH CONTRAST TECHNIQUE: Multidetector CT imaging of the abdomen and pelvis was performed using the standard protocol following bolus administration of intravenous contrast. CONTRAST:  157mL ISOVUE-300 IOPAMIDOL (ISOVUE-300) INJECTION 61% COMPARISON:  None. FINDINGS: Lower chest: Within normal limits. Hepatobiliary: The liver is within normal limits. Multiple gallstones are noted within the gallbladder. Pancreas: Distal pancreas demonstrates a calcification within. This may represent an obstructive stone and would correlate with the distal abnormality within the tail of the pancreas described below. Spleen: Within normal limits. Adrenals/Urinary Tract: The adrenal glands are unremarkable. The kidneys demonstrates some scattered small nonobstructing stones in the right kidney. The collecting systems and ureters are within normal limits. The bladder is well distended. Stomach/Bowel: Diverticular change of the colon is noted with evidence of inflammatory changes near the splenic flexure.  This is likely reactive to the adjacent fluid collection described below. The appendix is within normal limits. Vascular/Lymphatic: Aortic atherosclerosis. No enlarged abdominal or pelvic lymph nodes. Reproductive: Prostate is unremarkable. Other: Multiloculated fluid collection measuring approximately 8.8 by 4.2 by 4.0 cm . Small amount of fluid is noted adjacent to the posterior aspect of the stomach as well. It measures approximately 3.3 cm in greatest dimension. Musculoskeletal: No acute or significant osseous findings. IMPRESSION: Multiloculated fluid collection in the left upper quadrant beneath the spleen and adjacent to the tail of the pancreas and splenic flexure of the colon. Given the elevated lipase these changes likely represent focal pancreatitis and pseudocyst formation. Calcification is noted in the distal aspect of the pancreas likely representing an obstructing calculus. Electronically Signed   By: Inez Catalina M.D.   On: 11/16/2016 15:20    Scheduled Meds: . sodium chloride   Intravenous Once  . cholecalciferol  5,000 Units Oral Daily  . folic acid  1 mg Oral MG-Q6P  . metoprolol tartrate  12.5 mg Oral BID  . multivitamin with minerals  1 tablet Oral QHS  . pantoprazole (PROTONIX) IV  40 mg Intravenous Q12H  . sodium chloride flush  3  mL Intravenous Q12H  . tamsulosin  0.4 mg Oral QHS  . thiamine  100 mg Oral Daily  . traZODone  100 mg Oral QHS   Continuous Infusions: . sodium chloride 75 mL/hr at 11/17/16 0058    Principal Problem:   Acute GI bleeding Active Problems:   Acute blood loss anemia    Time spent: 35 minutes.    Barton Dubois  [facility] Pager [phone]. If 7PM-7AM, please contact night-coverage at [email or web address], password [facility] 11/17/2016, 8:37 AM  LOS: 0 days

## 2016-11-18 DIAGNOSIS — N400 Enlarged prostate without lower urinary tract symptoms: Secondary | ICD-10-CM

## 2016-11-18 DIAGNOSIS — K858 Other acute pancreatitis without necrosis or infection: Secondary | ICD-10-CM

## 2016-11-18 DIAGNOSIS — K859 Acute pancreatitis without necrosis or infection, unspecified: Secondary | ICD-10-CM

## 2016-11-18 DIAGNOSIS — F329 Major depressive disorder, single episode, unspecified: Secondary | ICD-10-CM

## 2016-11-18 LAB — CBC WITH DIFFERENTIAL/PLATELET
BASOS ABS: 0 10*3/uL (ref 0.0–0.1)
BASOS PCT: 0 %
Eosinophils Absolute: 0.1 10*3/uL (ref 0.0–0.7)
Eosinophils Relative: 1 %
HEMATOCRIT: 25 % — AB (ref 39.0–52.0)
Hemoglobin: 8.1 g/dL — ABNORMAL LOW (ref 13.0–17.0)
LYMPHS PCT: 28 %
Lymphs Abs: 2.2 10*3/uL (ref 0.7–4.0)
MCH: 26.3 pg (ref 26.0–34.0)
MCHC: 32.4 g/dL (ref 30.0–36.0)
MCV: 81.2 fL (ref 78.0–100.0)
Monocytes Absolute: 0.9 10*3/uL (ref 0.1–1.0)
Monocytes Relative: 11 %
NEUTROS ABS: 4.9 10*3/uL (ref 1.7–7.7)
Neutrophils Relative %: 60 %
Platelets: 132 10*3/uL — ABNORMAL LOW (ref 150–400)
RBC: 3.08 MIL/uL — AB (ref 4.22–5.81)
RDW: 16.9 % — ABNORMAL HIGH (ref 11.5–15.5)
WBC: 8.1 10*3/uL (ref 4.0–10.5)

## 2016-11-18 LAB — CBC
HCT: 23.4 % — ABNORMAL LOW (ref 39.0–52.0)
HEMOGLOBIN: 7.6 g/dL — AB (ref 13.0–17.0)
MCH: 26.5 pg (ref 26.0–34.0)
MCHC: 32.5 g/dL (ref 30.0–36.0)
MCV: 81.5 fL (ref 78.0–100.0)
Platelets: 122 10*3/uL — ABNORMAL LOW (ref 150–400)
RBC: 2.87 MIL/uL — AB (ref 4.22–5.81)
RDW: 16.9 % — ABNORMAL HIGH (ref 11.5–15.5)
WBC: 6.7 10*3/uL (ref 4.0–10.5)

## 2016-11-18 LAB — LIPASE, BLOOD: Lipase: 32 U/L (ref 11–51)

## 2016-11-18 MED ORDER — SODIUM CHLORIDE 0.9 % IV SOLN
INTRAVENOUS | Status: DC
  Administered 2016-11-19: 07:00:00 via INTRAVENOUS

## 2016-11-18 NOTE — Progress Notes (Signed)
[facility] PROGRESS NOTE  Barry Mccoy IRW:[id number] DOB: 03-25-1951 DOA: 11/16/2016 PCP: Karis Juba, PA-C  Interim summary and HPI 66 year old male, lives with his daughter, independent of activities of daily living, PMH of alcohol abuse (quit ? 1 year ago), tobacco abuse (quit 2 months ago), anxiety, depression, stage II chronic kidney disease, recent couple of hospitalizations(1/22-2/13, 2/26-3/3), for acute GI bleed & symptomatic anemia; who presented with abd pain, black stools and symptomatic anemia symptoms (lightheadedness, fatigue/weakness and dizziness).  Assessment/Plan: 1. Subacute GI bleed: Has been extensively evaluated by [facility] including EGD, colonoscopy and capsule endoscopy over the 2 prior admissions in the last 2 months without clear source. Planning for another EGD on 3/12;  will continue IV PPI twice a day and continue supportive care. Will follow Hgb trend and transfuse as needed. 2. Acute blood loss anemia, symptomatic: recent extensive work up and no source identified. S/p 2 unit PRBC's. Hgb stable at 8.1 now. Patient received also IV iron per pharmacy X 1 on 11/17/16. No further active bleeding currently. Will follow trend. Per GI EGD on 11/19/16. 3. LUQ abdominal pain/possible focal pancreatitis with pseudocyst formation and obstructing calculus: New onset abdominal pain 48 hours prior to admission. 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 Lipase was only minimally elevated, now WNL. Tolerating diet as indicated by GI service. Will continue, IV fluids and judicious pain management. Will await GI further input; maybe repeat CT scan to re-evaluate pseudocyst in the next day or so. 4. History of alcohol dependence: As per patient, alcohol free for over a year now. Will Continue multivitamins. No signs of withdrawal. 5. History of tobacco abuse: States that he quit smoking on 10/01/16. Encourage to keep  himself smoking free. 6. Essential hypertension: Soft blood pressures. Continue metoprolol (adjusted dose) and with holding parameters. 7. BPH: Continue home medications. No signs of symptoms for urinary retention.  Code Status: Full Family Communication: none at bedside  Disposition Plan: hemodynamically stable and with just soft BP; will transfer to telemetry bed. Per GI standpoint, EGD on 11/19/16. No active bleeding appreciated overnight. Hgb 8.1 today. S/P IV iron by pharmacy on 3/10.   Consultants:  [facility] (Dr. Amedeo Plenty coming to see him)  Procedures:  See below for x-ray reports   EGD planned for 11/19/16  Antibiotics:  None   HPI/Subjective: Patient with soft BP, but stable. Regular rate now and still with intermittent left quadrant abd discomfort. No nausea, no vomiting. Reported to be hungry.  Objective: Vitals:   11/18/16 0600 11/18/16 0800  BP: (!) 118/50 (!) 90/51  Pulse: 76 81  Resp: 15 (!) 28  Temp:  98 F (36.7 C)    Intake/Output Summary (Last 24 hours) at 11/18/16 0949 Last data filed at 11/18/16 0800  Gross per 24 hour  Intake          2468.25 ml  Output             2950 ml  Net          -481.75 ml   Filed Weights   11/16/16 1224 11/16/16 2307 11/18/16 0500  Weight: 95.7 kg (211 lb) 97.1 kg (214 lb 1.1 oz) 97.1 kg (214 lb 1.1 oz)    Exam:   General:  Afebrile, continue complaining of abd pain (left quadrant essentially), no CP or SOB. Patient is hungry and endorses no nausea, no vomiting. No signs of acute bleeding reported overnight.  Cardiovascular: S1 & S2, no rubs,  no gallops; regular rate. No JVD  Respiratory: good air movement, no wheezing, no crackles  Abdomen: soft, ND, positive BS, mild discomfort on deep palpation appreciated around left quadrant area.  Musculoskeletal: no edema or cyanosis   Data Reviewed: Basic Metabolic Panel:  Recent Labs Lab 11/16/16 1256 11/17/16 0430  NA 135 134*  K 3.8 4.0  CL 104 105  CO2 26 25   GLUCOSE 115* 116*  BUN 14 10  CREATININE 0.82 0.86  CALCIUM 8.9 8.4*   Liver Function Tests:  Recent Labs Lab 11/16/16 1256 11/17/16 0430  AST 14* 17  ALT 13* 13*  ALKPHOS 61 63  BILITOT 0.5 1.0  PROT 6.2* 5.8*  ALBUMIN 3.3* 3.1*    Recent Labs Lab 11/16/16 1256 11/17/16 0430 11/18/16 0353  LIPASE 53* 32 32   CBC:  Recent Labs Lab 11/16/16 1256 11/16/16 2337 11/17/16 0430 11/17/16 1514 11/18/16 0353  WBC 7.7 8.1 8.0 8.6 8.1  NEUTROABS  --   --   --   --  4.9  HGB 7.2* 7.5* 7.8* 8.9* 8.1*  HCT 22.9* 24.4* 24.9* 27.9* 25.0*  MCV 81.5 80.3 81.6 82.3 81.2  PLT 158 152 152 138* 132*   CBG: No results for input(s): GLUCAP in the last 168 hours.  Recent Results (from the past 240 hour(s))  MRSA PCR Screening     Status: None   Collection Time: 11/17/16 12:34 AM  Result Value Ref Range Status   MRSA by PCR NEGATIVE NEGATIVE Final    Comment:        The GeneXpert MRSA Assay (FDA approved for NASAL specimens only), is one component of a comprehensive MRSA colonization surveillance program. It is not intended to diagnose MRSA infection nor to guide or monitor treatment for MRSA infections.      Studies: Ct Abdomen Pelvis W Contrast  Result Date: 11/16/2016 CLINICAL DATA:  Left-sided abdominal pain for 2 days EXAM: CT ABDOMEN AND PELVIS WITH CONTRAST TECHNIQUE: Multidetector CT imaging of the abdomen and pelvis was performed using the standard protocol following bolus administration of intravenous contrast. CONTRAST:  160mL ISOVUE-300 IOPAMIDOL (ISOVUE-300) INJECTION 61% COMPARISON:  None. FINDINGS: Lower chest: Within normal limits. Hepatobiliary: The liver is within normal limits. Multiple gallstones are noted within the gallbladder. Pancreas: Distal pancreas demonstrates a calcification within. This may represent an obstructive stone and would correlate with the distal abnormality within the tail of the pancreas described below. Spleen: Within normal limits.  Adrenals/Urinary Tract: The adrenal glands are unremarkable. The kidneys demonstrates some scattered small nonobstructing stones in the right kidney. The collecting systems and ureters are within normal limits. The bladder is well distended. Stomach/Bowel: Diverticular change of the colon is noted with evidence of inflammatory changes near the splenic flexure. This is likely reactive to the adjacent fluid collection described below. The appendix is within normal limits. Vascular/Lymphatic: Aortic atherosclerosis. No enlarged abdominal or pelvic lymph nodes. Reproductive: Prostate is unremarkable. Other: Multiloculated fluid collection measuring approximately 8.8 by 4.2 by 4.0 cm . Small amount of fluid is noted adjacent to the posterior aspect of the stomach as well. It measures approximately 3.3 cm in greatest dimension. Musculoskeletal: No acute or significant osseous findings. IMPRESSION: Multiloculated fluid collection in the left upper quadrant beneath the spleen and adjacent to the tail of the pancreas and splenic flexure of the colon. Given the elevated lipase these changes likely represent focal pancreatitis and pseudocyst formation. Calcification is noted in the distal aspect of the pancreas likely representing an obstructing  calculus. Electronically Signed   By: Inez Catalina M.D.   On: 11/16/2016 15:20    Scheduled Meds: . cholecalciferol  5,000 Units Oral Daily  . folic acid  1 mg Oral [id number]  . metoprolol tartrate  12.5 mg Oral BID  . multivitamin with minerals  1 tablet Oral QHS  . pantoprazole (PROTONIX) IV  40 mg Intravenous Q12H  . sodium chloride flush  3 mL Intravenous Q12H  . tamsulosin  0.4 mg Oral QHS  . thiamine  100 mg Oral Daily  . traZODone  100 mg Oral QHS   Continuous Infusions:   Principal Problem:   Acute GI bleeding Active Problems:   Acute blood loss anemia   Acute pancreatitis    Time spent: 25 minutes.    Barton Dubois  [facility] Pager  [phone]. If 7PM-7AM, please contact night-coverage at [email or web address], password [facility] 11/18/2016, 9:49 AM  LOS: 1 day

## 2016-11-18 NOTE — Progress Notes (Signed)
[facility] Progress Note  Subjective: Patient having mild left mid abdominal pain but wants to eat and tolerated diet last night  Objective: Vital signs in last 24 hours: Temp:  [97.8 F (36.6 C)-98.6 F (37 C)] 98 F (36.7 C) (03/11 0800) Pulse Rate:  [41-124] 81 (03/11 0800) Resp:  [14-28] 28 (03/11 0800) BP: (81-119)/(35-62) 90/51 (03/11 0800) SpO2:  [89 %-98 %] 96 % (03/11 0800) Weight:  [97.1 kg (214 lb 1.1 oz)] 97.1 kg (214 lb 1.1 oz) (03/11 0500) Weight change: 1.391 kg (3 lb 1.1 oz)   PE: Unchanged  Lab Results: Results for orders placed or performed during the hospital encounter of 11/16/16 (from the past 24 hour(s))  BLOOD TRANSFUSION REPORT - SCANNED     Status: None   Collection Time: 11/17/16 12:03 PM   Narrative   Ordered by an unspecified provider.  CBC     Status: Abnormal   Collection Time: 11/17/16  3:14 PM  Result Value Ref Range   WBC 8.6 4.0 - 10.5 K/uL   RBC 3.39 (L) 4.22 - 5.81 MIL/uL   Hemoglobin 8.9 (L) 13.0 - 17.0 g/dL   HCT 27.9 (L) 39.0 - 52.0 %   MCV 82.3 78.0 - 100.0 fL   MCH 26.3 26.0 - 34.0 pg   MCHC 31.9 30.0 - 36.0 g/dL   RDW 17.0 (H) 11.5 - 15.5 %   Platelets 138 (L) 150 - 400 K/uL  CBC with Differential/Platelet     Status: Abnormal   Collection Time: 11/18/16  3:53 AM  Result Value Ref Range   WBC 8.1 4.0 - 10.5 K/uL   RBC 3.08 (L) 4.22 - 5.81 MIL/uL   Hemoglobin 8.1 (L) 13.0 - 17.0 g/dL   HCT 25.0 (L) 39.0 - 52.0 %   MCV 81.2 78.0 - 100.0 fL   MCH 26.3 26.0 - 34.0 pg   MCHC 32.4 30.0 - 36.0 g/dL   RDW 16.9 (H) 11.5 - 15.5 %   Platelets 132 (L) 150 - 400 K/uL   Neutrophils Relative % 60 %   Neutro Abs 4.9 1.7 - 7.7 K/uL   Lymphocytes Relative 28 %   Lymphs Abs 2.2 0.7 - 4.0 K/uL   Monocytes Relative 11 %   Monocytes Absolute 0.9 0.1 - 1.0 K/uL   Eosinophils Relative 1 %   Eosinophils Absolute 0.1 0.0 - 0.7 K/uL   Basophils Relative 0 %   Basophils Absolute 0.0 0.0 - 0.1 K/uL  Lipase, blood     Status: None    Collection Time: 11/18/16  3:53 AM  Result Value Ref Range   Lipase 32 11 - 51 U/L    Studies/Results: Ct Abdomen Pelvis W Contrast  Result Date: 11/16/2016 CLINICAL DATA:  Left-sided abdominal pain for 2 days EXAM: CT ABDOMEN AND PELVIS WITH CONTRAST TECHNIQUE: Multidetector CT imaging of the abdomen and pelvis was performed using the standard protocol following bolus administration of intravenous contrast. CONTRAST:  137mL ISOVUE-300 IOPAMIDOL (ISOVUE-300) INJECTION 61% COMPARISON:  None. FINDINGS: Lower chest: Within normal limits. Hepatobiliary: The liver is within normal limits. Multiple gallstones are noted within the gallbladder. Pancreas: Distal pancreas demonstrates a calcification within. This may represent an obstructive stone and would correlate with the distal abnormality within the tail of the pancreas described below. Spleen: Within normal limits. Adrenals/Urinary Tract: The adrenal glands are unremarkable. The kidneys demonstrates some scattered small nonobstructing stones in the right kidney. The collecting systems and ureters are within normal limits. The bladder is well distended.  Stomach/Bowel: Diverticular change of the colon is noted with evidence of inflammatory changes near the splenic flexure. This is likely reactive to the adjacent fluid collection described below. The appendix is within normal limits. Vascular/Lymphatic: Aortic atherosclerosis. No enlarged abdominal or pelvic lymph nodes. Reproductive: Prostate is unremarkable. Other: Multiloculated fluid collection measuring approximately 8.8 by 4.2 by 4.0 cm . Small amount of fluid is noted adjacent to the posterior aspect of the stomach as well. It measures approximately 3.3 cm in greatest dimension. Musculoskeletal: No acute or significant osseous findings. IMPRESSION: Multiloculated fluid collection in the left upper quadrant beneath the spleen and adjacent to the tail of the pancreas and splenic flexure of the colon. Given  the elevated lipase these changes likely represent focal pancreatitis and pseudocyst formation. Calcification is noted in the distal aspect of the pancreas likely representing an obstructing calculus. Electronically Signed   By: Inez Catalina M.D.   On: 11/16/2016 15:20      Assessment: 1. Continued subacute GI bleeding, recent capsule endoscopy and colonoscopy unrevealing with EGD in January showing "portal gastropathy versus inflammatory gastritis " 2. Left upper quadrant pain, peripancreatic fluid collection, possible evolving pseudocyst , pancreatic duct calcification, mildly symptomatic but tolerating diet, and elevated lipase now normal  Plan: 1. Will set up an EGD with enteroscopy tomorrow 2. Follow pancreatic abnormalities clinically and probably reimage in a week or 2. May or may not need expert pancreatic endoscopy at some point    Nabil Bubolz C 11/18/2016, 9:21 AM  Pager [phone] If no answer or after 5 PM call [phone]

## 2016-11-18 NOTE — Progress Notes (Signed)
Nutrition Brief Note  Patient identified on the Malnutrition Screening Tool (MST) Report  Pt with insignificant weight loss for time frame with weight gain since February. Pt eating 75-100% of meals at this time.Pt reports good appetite.   Wt Readings from Last 15 Encounters:  11/18/16 217 lb 9.5 oz (98.7 kg)  11/07/16 211 lb 13.8 oz (96.1 kg)  11/05/16 213 lb 12.8 oz (97 kg)  10/23/16 202 lb 6.1 oz (91.8 kg)  10/22/16 198 lb 12.8 oz (90.2 kg)  05/31/16 236 lb (107 kg)  04/19/16 231 lb (104.8 kg)  12/14/15 243 lb (110.2 kg)  12/05/15 243 lb (110.2 kg)  10/17/15 237 lb (107.5 kg)  07/10/12 260 lb (117.9 kg)    Body mass index is 27.94 kg/m. Patient meets criteria for overweight based on current BMI.   Current diet order is Heart Healthy, patient is consuming approximately 75-100% of meals at this time. Labs and medications reviewed.   No nutrition interventions warranted at this time. If nutrition issues arise, please consult RD.   Clayton Bibles, MS, RD, LDN Pager: [phone] After Hours Pager: [phone]

## 2016-11-19 ENCOUNTER — Encounter (HOSPITAL_COMMUNITY): Payer: Self-pay | Admitting: *Deleted

## 2016-11-19 ENCOUNTER — Inpatient Hospital Stay (HOSPITAL_COMMUNITY): Payer: Medicare Other | Admitting: Certified Registered Nurse Anesthetist

## 2016-11-19 ENCOUNTER — Encounter (HOSPITAL_COMMUNITY)
Admission: EM | Disposition: A | Discharge status: Home / Self Care | Payer: Self-pay | Source: Home / Self Care | Attending: Internal Medicine

## 2016-11-19 HISTORY — PX: ENTEROSCOPY: SHX5533

## 2016-11-19 SURGERY — ENTEROSCOPY
Anesthesia: Monitor Anesthesia Care

## 2016-11-19 MED ORDER — PROPOFOL 500 MG/50ML IV EMUL
INTRAVENOUS | Status: DC | PRN
  Administered 2016-11-19: 150 ug/kg/min via INTRAVENOUS

## 2016-11-19 MED ORDER — ONDANSETRON HCL 4 MG/2ML IJ SOLN
INTRAMUSCULAR | Status: AC
  Filled 2016-11-19: qty 2

## 2016-11-19 MED ORDER — LACTATED RINGERS IV SOLN
INTRAVENOUS | Status: DC
  Administered 2016-11-19: 10:00:00 via INTRAVENOUS

## 2016-11-19 MED ORDER — PHENYLEPHRINE HCL 10 MG/ML IJ SOLN
INTRAMUSCULAR | Status: DC | PRN
  Administered 2016-11-19 (×5): 80 ug via INTRAVENOUS

## 2016-11-19 MED ORDER — LIDOCAINE 2% (20 MG/ML) 5 ML SYRINGE
INTRAMUSCULAR | Status: DC | PRN
  Administered 2016-11-19: 100 mg via INTRAVENOUS

## 2016-11-19 MED ORDER — PROPOFOL 10 MG/ML IV BOLUS
INTRAVENOUS | Status: AC
  Filled 2016-11-19: qty 40

## 2016-11-19 MED ORDER — ONDANSETRON HCL 4 MG/2ML IJ SOLN
INTRAMUSCULAR | Status: DC | PRN
  Administered 2016-11-19: 4 mg via INTRAVENOUS

## 2016-11-19 MED ORDER — EPHEDRINE SULFATE 50 MG/ML IJ SOLN
INTRAMUSCULAR | Status: DC | PRN
  Administered 2016-11-19 (×3): 10 mg via INTRAVENOUS

## 2016-11-19 MED ORDER — LIDOCAINE 2% (20 MG/ML) 5 ML SYRINGE
INTRAMUSCULAR | Status: AC
  Filled 2016-11-19: qty 5

## 2016-11-19 NOTE — Anesthesia Preprocedure Evaluation (Addendum)
Anesthesia Evaluation  Patient identified by MRN, date of birth, ID band Patient awake    Reviewed: Allergy & Precautions, NPO status , Patient's Chart, lab work & pertinent test results  Airway Mallampati: II  TM Distance: >3 FB Neck ROM: Full    Dental no notable dental hx. (+) Edentulous Upper, Edentulous Lower   Pulmonary neg pulmonary ROS, former smoker,    Pulmonary exam normal breath sounds clear to auscultation       Cardiovascular negative cardio ROS Normal cardiovascular exam Rhythm:Regular Rate:Normal     Neuro/Psych negative neurological ROS  negative psych ROS   GI/Hepatic negative GI ROS, Neg liver ROS,   Endo/Other  negative endocrine ROS  Renal/GU Renal InsufficiencyRenal disease  negative genitourinary   Musculoskeletal negative musculoskeletal ROS (+)   Abdominal   Peds negative pediatric ROS (+)  Hematology  (+) anemia ,   Anesthesia Other Findings   Reproductive/Obstetrics negative OB ROS                            Anesthesia Physical Anesthesia Plan  ASA: II  Anesthesia Plan: MAC   Post-op Pain Management:    Induction: Intravenous  Airway Management Planned: Simple Face Mask and Natural Airway  Additional Equipment:   Intra-op Plan:   Post-operative Plan:   Informed Consent: I have reviewed the patients History and Physical, chart, labs and discussed the procedure including the risks, benefits and alternatives for the proposed anesthesia with the patient or authorized representative who has indicated his/her understanding and acceptance.   Dental advisory given  Plan Discussed with: CRNA  Anesthesia Plan Comments:         Anesthesia Quick Evaluation

## 2016-11-19 NOTE — Transfer of Care (Signed)
Immediate Anesthesia Transfer of Care Note  Patient: Barry Mccoy  Procedure(s) Performed: Procedure(s): ENTEROSCOPY (N/A)  Patient Location: PACU  Anesthesia Type:MAC  Level of Consciousness:  sedated, patient cooperative and responds to stimulation  Airway & Oxygen Therapy:Patient Spontanous Breathing and Patient connected to face mask oxgen  Post-op Assessment:  Report given to PACU RN and Post -op Vital signs reviewed and stable  Post vital signs:  Reviewed and stable  Last Vitals:  Vitals:   11/19/16 0621 11/19/16 0949  BP: 132/64 122/65  Pulse: (!) 110 85  Resp: 18 17  Temp: 36.9 C 18.9 C    Complications: No apparent anesthesia complications

## 2016-11-19 NOTE — Progress Notes (Signed)
[facility] PROGRESS NOTE  Barry Mccoy ZOX:[id number] DOB: [birth year] DOA: 11/16/2016 PCP: Karis Juba, PA-C  Interim summary and HPI 66 year old male, lives with his daughter, independent of activities of daily living, PMH of alcohol abuse (quit ? 1 year ago), tobacco abuse (quit 2 months ago), anxiety, depression, stage II chronic kidney disease, recent couple of hospitalizations(1/22-2/13, 2/26-3/3), for acute GI bleed & symptomatic anemia; who presented with abd pain, black stools and symptomatic anemia symptoms (lightheadedness, fatigue/weakness and dizziness).  Assessment/Plan: 1. Subacute GI bleed: Has been extensively evaluated by [facility] including EGD, colonoscopy and capsule endoscopy over the 2 prior admissions in the last 2 months without clear source. S/P another EGD on 3/12 (no abnormalities seen);  will continue IV PPI twice a day for another 24 hours and continue supportive care. Will follow Hgb trend and transfuse as needed. Diet advance as per GI rec's 2. Acute blood loss anemia, symptomatic: recent extensive work up and no source identified. S/p 2 unit PRBC's. Hgb down to 7.6. Patient received IV iron per pharmacy X 1 on 11/17/16. Patient reported small dark stools overnight. Will follow trend and transfuse for Hgb < 7.0  3. LUQ abdominal pain/possible focal pancreatitis with pseudocyst formation and obstructing calculus: New onset abdominal pain 48 hours prior to admission. 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 Lipase was only minimally elevated, now WNL. Tolerating diet as indicated by GI service. Will continue, IV fluids and judicious pain management. Will await GI further input; maybe repeat CT scan to re-evaluate pseudocyst in the next week or so. 4. History of alcohol dependence: As per patient, alcohol free for over a year now. Will Continue multivitamins. No signs of withdrawal. 5. History of  tobacco abuse: States that he quit smoking on 10/01/16. Encourage to keep himself smoking free. 6. Essential hypertension: Soft blood pressures. Continue metoprolol (adjusted dose) and with holding parameters. 7. BPH: Continue home medications. No signs of symptoms for urinary retention.  Code Status: Full Family Communication: daughter over the phone. Disposition Plan: hemodynamically stable and with just soft BP (around pain meds); will continue monitoring on telemetry bed. Per GI standpoint, S/P unrevealing EGD on 11/19/16. No active bleeding appreciated overnight. Hgb 7.6 today. S/P IV iron by pharmacy on 3/10.   Consultants:  [facility] (Dr. Amedeo Plenty coming to see him)  Procedures:  See below for x-ray reports   EGD planned for 11/19/16  Antibiotics:  None   HPI/Subjective: Patient with soft BP, but stable. Slight sinus tachycardia prior to get his metoprolol dose. No CP, no SOB. Continue to experienced left quadrant abd discomfort. No nausea, no vomiting. Experienced dark stools overnight according to him.  Objective: Vitals:   11/19/16 1323 11/19/16 1421  BP: 107/66   Pulse: (!) 135 94  Resp: 18   Temp: 98.1 F (36.7 C)     Intake/Output Summary (Last 24 hours) at 11/19/16 1428 Last data filed at 11/19/16 1251  Gross per 24 hour  Intake             1357 ml  Output                0 ml  Net             1357 ml   Filed Weights   11/18/16 1218 11/19/16 0621 11/19/16 0949  Weight: 98.7 kg (217 lb 9.5 oz) 98.9 kg (218 lb 0.6 oz) 98.9 kg (218 lb)    Exam:  General:  Afebrile, continue complaining of abd pain (left quadrant essentially), no CP or SOB. Patient is tolerating diet w/o problems. Experienced small dark stools overnight. EGD done today (no acute abnormalities seen).   Cardiovascular: S1 & S2, no rubs, no gallops; regular rate. No JVD. Positive mild tachycardia   Respiratory: good air movement, no wheezing, no crackles  Abdomen: soft, ND, positive BS, mild  discomfort on deep palpation appreciated around left quadrant area.  Musculoskeletal: no edema or cyanosis   Data Reviewed: Basic Metabolic Panel:  Recent Labs Lab 11/16/16 1256 11/17/16 0430  NA 135 134*  K 3.8 4.0  CL 104 105  CO2 26 25  GLUCOSE 115* 116*  BUN 14 10  CREATININE 0.82 0.86  CALCIUM 8.9 8.4*   Liver Function Tests:  Recent Labs Lab 11/16/16 1256 11/17/16 0430  AST 14* 17  ALT 13* 13*  ALKPHOS 61 63  BILITOT 0.5 1.0  PROT 6.2* 5.8*  ALBUMIN 3.3* 3.1*    Recent Labs Lab 11/16/16 1256 11/17/16 0430 11/18/16 0353  LIPASE 53* 32 32   CBC:  Recent Labs Lab 11/16/16 2337 11/17/16 0430 11/17/16 1514 11/18/16 0353 11/18/16 1438  WBC 8.1 8.0 8.6 8.1 6.7  NEUTROABS  --   --   --  4.9  --   HGB 7.5* 7.8* 8.9* 8.1* 7.6*  HCT 24.4* 24.9* 27.9* 25.0* 23.4*  MCV 80.3 81.6 82.3 81.2 81.5  PLT 152 152 138* 132* 122*   CBG: No results for input(s): GLUCAP in the last 168 hours.  Recent Results (from the past 240 hour(s))  MRSA PCR Screening     Status: None   Collection Time: 11/17/16 12:34 AM  Result Value Ref Range Status   MRSA by PCR NEGATIVE NEGATIVE Final    Comment:        The GeneXpert MRSA Assay (FDA approved for NASAL specimens only), is one component of a comprehensive MRSA colonization surveillance program. It is not intended to diagnose MRSA infection nor to guide or monitor treatment for MRSA infections.      Studies: No results found.  Scheduled Meds: . cholecalciferol  5,000 Units Oral Daily  . folic acid  1 mg Oral [id number]  . metoprolol tartrate  12.5 mg Oral BID  . multivitamin with minerals  1 tablet Oral QHS  . pantoprazole (PROTONIX) IV  40 mg Intravenous Q12H  . sodium chloride flush  3 mL Intravenous Q12H  . tamsulosin  0.4 mg Oral QHS  . thiamine  100 mg Oral Daily  . traZODone  100 mg Oral QHS   Continuous Infusions:   Principal Problem:   Acute GI bleeding Active Problems:   Acute blood loss  anemia   Acute pancreatitis    Time spent: 25 minutes.    Barton Dubois  [facility] Pager [phone]. If 7PM-7AM, please contact night-coverage at [email or web address], password [facility] 11/19/2016, 2:28 PM  LOS: 2 days

## 2016-11-19 NOTE — H&P (Signed)
The patient is a 66 year old male who presents to the endoscopy unit for EGD/enteroscopy to evaluate for anemia. He has had a recent EGD showing portal hypertensive gastropathy, colonoscopy showing diverticulosis, and capsule endoscopy. It was not felt that any of the findings explained his anemia and therefore he was set up for this procedure today.  Physical:  No distress  Heart regular rhythm  Lungs clear  Abdomen soft with tenderness in the epigastrium  Impression: Anemia  Plan: EGD/enteroscopy

## 2016-11-19 NOTE — Care Management Note (Signed)
Case Management Note  Patient Details  Name: Barry Mccoy MRN: [id number] Date of Birth: 07/13/1951  Subjective/Objective:   abd pain r/o pancreatitis                 Action/Plan: Date:  November 19, 2016 Chart reviewed for concurrent status and case management needs. Will continue to follow patient progress. Discharge Planning: following for needs Expected discharge date: [id number] Velva Harman, BSN, [place], [place]  Expected Discharge Date:   (unknown)               Expected Discharge Plan:  Home/Self Care  In-House Referral:     Discharge planning Services     Post Acute Care Choice:    Choice offered to:     DME Arranged:    DME Agency:     HH Arranged:    [place] Agency:     Status of Service:  In process, will continue to follow  If discussed at Long Length of Stay Meetings, dates discussed:    Additional Comments:  Leeroy Cha, RN 11/19/2016, 11:41 AM

## 2016-11-19 NOTE — Op Note (Signed)
[facility] Patient Name: Barry Mccoy Procedure Date: 11/19/2016 MRN: [id number] Attending MD: Wonda Horner , MD Date of Birth: 1951/01/16 CSN: [id number] Age: 66 Admit Type: Inpatient Procedure:                Small bowel enteroscopy Indications:              Gastrointestinal bleeding source not documented by                            other exams Providers:                Wonda Horner, MD, Laverta Baltimore RN, RN, Alfonso Patten, Technician, Christell Faith, CRNA Referring MD:              Medicines:                Propofol per Anesthesia Complications:            No immediate complications. Estimated Blood Loss:     Estimated blood loss: none. Procedure:                Pre-Anesthesia Assessment:                           - Prior to the procedure, a History and Physical                            was performed, and patient medications and                            allergies were reviewed. The patient's tolerance of                            previous anesthesia was also reviewed. The risks                            and benefits of the procedure and the sedation                            options and risks were discussed with the patient.                            All questions were answered, and informed consent                            was obtained. Prior Anticoagulants: The patient has                            taken no previous anticoagulant or antiplatelet                            agents. ASA Grade Assessment: II - A patient with  mild systemic disease. After reviewing the risks                            and benefits, the patient was deemed in                            satisfactory condition to undergo the procedure.                           After obtaining informed consent, the endoscope was                            passed under direct vision. Throughout the                            procedure, the  patient's blood pressure, pulse, and                            oxygen saturations were monitored continuously. The                            EC-[id number] ([id number]) scope was introduced through                            the mouth and advanced to the mid-jejunum. The                            small bowel enteroscopy was accomplished without                            difficulty. The patient tolerated the procedure                            well. Scope In: Scope Out: Findings:      No gross lesions were noted in the entire esophagus.      Diffuse mildly erythematous mucosa was found in the stomach.      There was no evidence of significant pathology in the entire examined       duodenum.      There was no evidence of significant pathology in the entire examined       portion of jejunum. Impression:               - No gross lesions in esophagus.                           - Erythematous mucosa in the stomach.                           - Normal examined duodenum.                           - The examined portion of the jejunum was normal.                           - No specimens collected. Recommendation:           -  Resume regular diet.                           - Continue present medications.                           - Iron suppliment. Procedure Code(s):        --- Professional ---                           [phone], Small intestinal endoscopy, enteroscopy                            beyond second portion of duodenum, not including                            ileum; diagnostic, including collection of                            specimen(s) by brushing or washing, when performed                            (separate procedure) Diagnosis Code(s):        --- Professional ---                           K31.89, Other diseases of stomach and duodenum                           K92.2, Gastrointestinal hemorrhage, unspecified CPT copyright 2016 American Medical Association. All rights reserved. The  codes documented in this report are preliminary and upon coder review may  be revised to meet current compliance requirements. Wonda Horner, MD 11/19/2016 10:57:59 AM This report has been signed electronically. Number of Addenda: 0

## 2016-11-19 NOTE — Anesthesia Postprocedure Evaluation (Signed)
Anesthesia Post Note  Patient: Barry Mccoy  Procedure(s) Performed: Procedure(s) (LRB): ENTEROSCOPY (N/A)  Patient location during evaluation: PACU Anesthesia Type: MAC Level of consciousness: awake and alert Pain management: pain level controlled Vital Signs Assessment: post-procedure vital signs reviewed and stable Respiratory status: spontaneous breathing, nonlabored ventilation and respiratory function stable Cardiovascular status: stable and blood pressure returned to baseline Anesthetic complications: no       Last Vitals:  Vitals:   11/19/16 1100 11/19/16 1110  BP: (!) 98/52 (!) 113/54  Pulse: 79 75  Resp: 16 17  Temp:      Last Pain:  Vitals:   11/19/16 1110  TempSrc:   PainSc: 9                  [name]

## 2016-11-19 NOTE — Anesthesia Procedure Notes (Signed)
Procedure Name: MAC Date/Time: 11/19/2016 10:30 AM Performed by: West Pugh Pre-anesthesia Checklist: Patient identified, Emergency Drugs available, Suction available, Patient being monitored and Timeout performed Oxygen Delivery Method: Nasal cannula Placement Confirmation: CO2 detector and positive ETCO2 Dental Injury: Teeth and Oropharynx as per pre-operative assessment

## 2016-11-20 DIAGNOSIS — F17200 Nicotine dependence, unspecified, uncomplicated: Secondary | ICD-10-CM

## 2016-11-20 DIAGNOSIS — F1010 Alcohol abuse, uncomplicated: Secondary | ICD-10-CM

## 2016-11-20 LAB — CBC
HCT: 26.4 % — ABNORMAL LOW (ref 39.0–52.0)
Hemoglobin: 8.2 g/dL — ABNORMAL LOW (ref 13.0–17.0)
MCH: 25.3 pg — AB (ref 26.0–34.0)
MCHC: 31.1 g/dL (ref 30.0–36.0)
MCV: 81.5 fL (ref 78.0–100.0)
Platelets: 160 10*3/uL (ref 150–400)
RBC: 3.24 MIL/uL — ABNORMAL LOW (ref 4.22–5.81)
RDW: 17.6 % — AB (ref 11.5–15.5)
WBC: 6.6 10*3/uL (ref 4.0–10.5)

## 2016-11-20 LAB — BPAM RBC
BLOOD PRODUCT EXPIRATION DATE: 201803292359
Blood Product Expiration Date: 201803292359
Blood Product Expiration Date: 201803292359
ISSUE DATE / TIME: 201803091909
ISSUE DATE / TIME: 201803100851
UNIT TYPE AND RH: 6200
Unit Type and Rh: 6200
Unit Type and Rh: 6200

## 2016-11-20 LAB — TYPE AND SCREEN
ABO/RH(D): A POS
ANTIBODY SCREEN: NEGATIVE
UNIT DIVISION: 0
UNIT DIVISION: 0
Unit division: 0

## 2016-11-20 MED ORDER — METOPROLOL TARTRATE 25 MG PO TABS
25.0000 mg | ORAL_TABLET | Freq: Two times a day (BID) | ORAL | Status: DC
  Administered 2016-11-20 – 2016-11-21 (×2): 25 mg via ORAL
  Filled 2016-11-20 (×2): qty 1

## 2016-11-20 MED ORDER — SERTRALINE HCL 50 MG PO TABS
50.0000 mg | ORAL_TABLET | Freq: Every day | ORAL | Status: DC
  Administered 2016-11-20 – 2016-11-21 (×2): 50 mg via ORAL
  Filled 2016-11-20 (×2): qty 1

## 2016-11-20 MED ORDER — POLYSACCHARIDE IRON COMPLEX 150 MG PO CAPS
150.0000 mg | ORAL_CAPSULE | Freq: Two times a day (BID) | ORAL | Status: DC
  Administered 2016-11-20 – 2016-11-21 (×3): 150 mg via ORAL
  Filled 2016-11-20 (×3): qty 1

## 2016-11-20 MED ORDER — VITAMIN B-12 1000 MCG PO TABS
1000.0000 ug | ORAL_TABLET | Freq: Every day | ORAL | Status: DC
  Administered 2016-11-20 – 2016-11-21 (×2): 1000 ug via ORAL
  Filled 2016-11-20 (×2): qty 1

## 2016-11-20 MED ORDER — MORPHINE SULFATE (PF) 4 MG/ML IV SOLN
2.0000 mg | Freq: Four times a day (QID) | INTRAVENOUS | Status: DC | PRN
  Administered 2016-11-20 (×2): 2 mg via INTRAVENOUS
  Filled 2016-11-20 (×2): qty 1

## 2016-11-20 MED ORDER — TRAMADOL HCL 50 MG PO TABS
50.0000 mg | ORAL_TABLET | Freq: Four times a day (QID) | ORAL | Status: DC | PRN
  Administered 2016-11-20 – 2016-11-21 (×2): 50 mg via ORAL
  Filled 2016-11-20 (×2): qty 1

## 2016-11-20 MED ORDER — PANTOPRAZOLE SODIUM 40 MG PO TBEC
40.0000 mg | DELAYED_RELEASE_TABLET | Freq: Two times a day (BID) | ORAL | Status: DC
  Administered 2016-11-20 – 2016-11-21 (×2): 40 mg via ORAL
  Filled 2016-11-20 (×2): qty 1

## 2016-11-20 NOTE — Progress Notes (Signed)
[facility] PROGRESS NOTE  Barry Mccoy WPY:[id number] DOB: November 18, 1950 DOA: 11/16/2016 PCP: Karis Juba, PA-C  Interim summary and HPI 66 year old male, lives with his daughter, independent of activities of daily living, PMH of alcohol abuse (quit ? 1 year ago), tobacco abuse (quit 2 months ago), anxiety, depression, stage II chronic kidney disease, recent couple of hospitalizations(1/22-2/13, 2/26-3/3), for acute GI bleed & symptomatic anemia; who presented with abd pain, black stools and symptomatic anemia symptoms (lightheadedness, fatigue/weakness and dizziness).  Assessment/Plan: 1. Subacute GI bleed: Has been extensively evaluated by [facility] including EGD, colonoscopy and capsule endoscopy over the 2 prior admissions in the last 2 months without clear source. S/P another EGD on 3/12 (no abnormalities seen);  will continue PPI twice a day; but will transition to PO. Will follow Hgb trend and transfuse as needed. Diet advance to regular diet now. 2. Acute blood loss anemia, symptomatic: recent extensive work up and no source identified. S/p 2 unit PRBC's. Hgb 8.2. Patient received IV iron per pharmacy X 1 on 11/17/16. Patient reported no dark stools or BRPR. Will follow trend and transfuse for Hgb < 7.0. Will start niferex BID and B12 (last one low normal) 3. LUQ abdominal pain/possible focal pancreatitis with pseudocyst formation and obstructing calculus: New onset abdominal pain 48 hours prior to admission. 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 Lipase was only minimally elevated, now WNL. Tolerating diet as indicated by GI service. Will continue, IV fluids and judicious pain management. Planning repeat CT scan to re-evaluate pseudocyst in the next week or so (outpatient follow up with Dr. Amedeo Plenty in 1-2 weeks). 4. History of alcohol dependence: As per patient, alcohol free for over a year now. Will Continue multivitamins.  No signs of withdrawal. 5. History of tobacco abuse: States that he quit smoking on 10/01/16. Encourage to keep himself smoking free. 6. Essential hypertension: Soft blood pressure, but stable. Continue metoprolol  with holding parameters. 7. BPH: Continue home medications. No signs of symptoms for urinary retention.  Code Status: Full Family Communication: daughter over the phone. Disposition Plan: hemodynamically stable and tolerating diet; no dark stools or BRBPR. Will start PO meds, initiate niferex and B12; follow Hgb trend one more day before discharge. Per GI standpoint, ok to discharge (S/P unrevealing EGD on 11/19/16). Hgb 8.2 today. S/P IV iron by pharmacy on 3/10.   Consultants:  [facility] (Dr. Amedeo Plenty coming to see him)  Procedures:  See below for x-ray reports   EGD done on 11/19/16  Antibiotics:  None   HPI/Subjective: Patient denies CP, SOB and nausea/vomiting. Reports improvement in his abdominal pain. No dark stools or BRBPR last night.  Objective: Vitals:   11/20/16 0654 11/20/16 1020  BP: (!) 113/54 106/61  Pulse:  96  Resp:    Temp:      Intake/Output Summary (Last 24 hours) at 11/20/16 1337 Last data filed at 11/20/16 1035  Gross per 24 hour  Intake              924 ml  Output                0 ml  Net              924 ml   Filed Weights   11/19/16 0621 11/19/16 0949 11/20/16 0606  Weight: 98.9 kg (218 lb 0.6 oz) 98.9 kg (218 lb) 97.2 kg (214 lb 4.6 oz)    Exam:   General:  Afebrile,  reports improvement in abd pain (left quadrant essentially), no CP or SOB. Patient is tolerating diet w/o problems. No further dark stools or BRBPR overnight. No acute distress.  Cardiovascular: S1 & S2, no rubs, no gallops; regular rate. No JVD.   Respiratory: good air movement, no wheezing, no crackles  Abdomen: soft, ND, positive BS, mild discomfort on deep palpation appreciated around left quadrant area.  Musculoskeletal: no edema or cyanosis   Data  Reviewed: Basic Metabolic Panel:  Recent Labs Lab 11/16/16 1256 11/17/16 0430  NA 135 134*  K 3.8 4.0  CL 104 105  CO2 26 25  GLUCOSE 115* 116*  BUN 14 10  CREATININE 0.82 0.86  CALCIUM 8.9 8.4*   Liver Function Tests:  Recent Labs Lab 11/16/16 1256 11/17/16 0430  AST 14* 17  ALT 13* 13*  ALKPHOS 61 63  BILITOT 0.5 1.0  PROT 6.2* 5.8*  ALBUMIN 3.3* 3.1*    Recent Labs Lab 11/16/16 1256 11/17/16 0430 11/18/16 0353  LIPASE 53* 32 32   CBC:  Recent Labs Lab 11/17/16 0430 11/17/16 1514 11/18/16 0353 11/18/16 1438 11/20/16 0621  WBC 8.0 8.6 8.1 6.7 6.6  NEUTROABS  --   --  4.9  --   --   HGB 7.8* 8.9* 8.1* 7.6* 8.2*  HCT 24.9* 27.9* 25.0* 23.4* 26.4*  MCV 81.6 82.3 81.2 81.5 81.5  PLT 152 138* 132* 122* 160   CBG: No results for input(s): GLUCAP in the last 168 hours.  Recent Results (from the past 240 hour(s))  MRSA PCR Screening     Status: None   Collection Time: 11/17/16 12:34 AM  Result Value Ref Range Status   MRSA by PCR NEGATIVE NEGATIVE Final    Comment:        The GeneXpert MRSA Assay (FDA approved for NASAL specimens only), is one component of a comprehensive MRSA colonization surveillance program. It is not intended to diagnose MRSA infection nor to guide or monitor treatment for MRSA infections.      Studies: No results found.  Scheduled Meds: . cholecalciferol  5,000 Units Oral Daily  . folic acid  1 mg Oral [id number]  . iron polysaccharides  150 mg Oral BID  . metoprolol tartrate  25 mg Oral BID  . multivitamin with minerals  1 tablet Oral QHS  . pantoprazole  40 mg Oral BID  . sertraline  50 mg Oral Daily  . sodium chloride flush  3 mL Intravenous Q12H  . tamsulosin  0.4 mg Oral QHS  . thiamine  100 mg Oral Daily  . traZODone  100 mg Oral QHS   Continuous Infusions:   Principal Problem:   Acute GI bleeding Active Problems:   Acute blood loss anemia   Acute pancreatitis    Time spent: 25  minutes.    Barton Dubois  [facility] Pager [phone]. If 7PM-7AM, please contact night-coverage at [email or web address], password [email or web address] 11/20/2016, 1:37 PM  LOS: 3 days

## 2016-11-20 NOTE — Progress Notes (Signed)
PHARMACIST - PHYSICIAN COMMUNICATION  DR:   Dyann Kief  CONCERNING: IV to Oral Route Change Policy  RECOMMENDATION: This patient is receiving Pantoprazole 40 mg by the intravenous route.  Based on criteria approved by the Pharmacy and Therapeutics Committee, the intravenous medication(s) is/are being converted to the equivalent oral dose form(s).   DESCRIPTION: These criteria include:  The patient is eating (either orally or via tube) and/or has been taking other orally administered medications for a least 24 hours  The patient has no evidence of active gastrointestinal bleeding or impaired GI absorption (gastrectomy, short bowel, patient on TNA or NPO).  If you have questions about this conversion, please contact the Pharmacy Department  []   [phone] )  [facility] []   [phone] )  [facility] []   [phone] )  [facility] []   [phone] )  [facility] [x]   [phone] )  [place], [place] 11/20/2016 10:47 AM

## 2016-11-20 NOTE — Progress Notes (Signed)
[facility] Progress Note  Subjective: Patient continues to experience some discomfort in the left upper quadrant and left flank area. We talked about this and I suspect it is related to the inflammation in the region of the tail of the pancreas with pseudocyst as noted on recent CT scan. He is eating and tolerating his diet.  Evaluation for a source of gastrointestinal bleeding has been negative  Objective: Vital signs in last 24 hours: Temp:  [97.8 F (36.6 C)-99.1 F (37.3 C)] 98.3 F (36.8 C) (03/13 0606) Pulse Rate:  [75-135] 131 (03/13 0606) Resp:  [16-19] 19 (03/13 0606) BP: (96-122)/(52-66) 113/54 (03/13 0654) SpO2:  [96 %-100 %] 96 % (03/13 0606) Weight:  [97.2 kg (214 lb 4.6 oz)-98.9 kg (218 lb)] 97.2 kg (214 lb 4.6 oz) (03/13 0606) Weight change: 0.184 kg (6.5 oz)   PE:  No distress  Nonicteric  Heart regular rhythm  Lungs clear  Abdomen: Soft, mild discomfort left upper quadrant  Lab Results: Results for orders placed or performed during the hospital encounter of 11/16/16 (from the past 24 hour(s))  CBC     Status: Abnormal   Collection Time: 11/20/16  6:21 AM  Result Value Ref Range   WBC 6.6 4.0 - 10.5 K/uL   RBC 3.24 (L) 4.22 - 5.81 MIL/uL   Hemoglobin 8.2 (L) 13.0 - 17.0 g/dL   HCT 26.4 (L) 39.0 - 52.0 %   MCV 81.5 78.0 - 100.0 fL   MCH 25.3 (L) 26.0 - 34.0 pg   MCHC 31.1 30.0 - 36.0 g/dL   RDW 17.6 (H) 11.5 - 15.5 %   Platelets 160 150 - 400 K/uL    Studies/Results: No results found.    Assessment: Anemia. No obvious GI source found.  Pancreatitis with possible evolving pseudocyst  Plan:   He seems to be stable and is tolerating his diet. From our standpoint I think he can go home and follow-up with Dr. Amedeo Plenty as an outpatient. I would recommend sending him home on iron supplementation. Dr. Amedeo Plenty can plan follow-up CT imaging on the pancreas. We will sign off. Call us if needed.    SAM F Renel Ende 11/20/2016, 9:10 AM  Pager:  [phone] If no answer or after 5 PM call [phone]

## 2016-11-21 ENCOUNTER — Encounter (HOSPITAL_COMMUNITY): Payer: Self-pay | Admitting: Gastroenterology

## 2016-11-21 LAB — BASIC METABOLIC PANEL
ANION GAP: 10 (ref 5–15)
BUN: 7 mg/dL (ref 6–20)
CHLORIDE: 98 mmol/L — AB (ref 101–111)
CO2: 24 mmol/L (ref 22–32)
Calcium: 8.3 mg/dL — ABNORMAL LOW (ref 8.9–10.3)
Creatinine, Ser: 0.76 mg/dL (ref 0.61–1.24)
GFR calc non Af Amer: >60 mL/min (ref >60)
Glucose, Bld: 115 mg/dL — ABNORMAL HIGH (ref 65–99)
POTASSIUM: 3.9 mmol/L (ref 3.5–5.1)
SODIUM: 132 mmol/L — AB (ref 135–145)

## 2016-11-21 LAB — CBC
HCT: 27.4 % — ABNORMAL LOW (ref 39.0–52.0)
HEMOGLOBIN: 8.3 g/dL — AB (ref 13.0–17.0)
MCH: 24.9 pg — AB (ref 26.0–34.0)
MCHC: 30.3 g/dL (ref 30.0–36.0)
MCV: 82.3 fL (ref 78.0–100.0)
Platelets: 177 10*3/uL (ref 150–400)
RBC: 3.33 MIL/uL — AB (ref 4.22–5.81)
RDW: 17.8 % — ABNORMAL HIGH (ref 11.5–15.5)
WBC: 7.1 10*3/uL (ref 4.0–10.5)

## 2016-11-21 MED ORDER — POLYSACCHARIDE IRON COMPLEX 150 MG PO CAPS: 150.0000 mg | ORAL_CAPSULE | Freq: Two times a day (BID) | ORAL | 0 refills | Status: AC

## 2016-11-21 MED ORDER — CYANOCOBALAMIN 1000 MCG PO TABS: 1000.0000 ug | ORAL_TABLET | Freq: Every day | ORAL | 0 refills | Status: AC

## 2016-11-21 MED ORDER — PANTOPRAZOLE SODIUM 40 MG PO TBEC: 40.0000 mg | DELAYED_RELEASE_TABLET | Freq: Two times a day (BID) | ORAL | 0 refills | Status: DC

## 2016-11-21 MED ORDER — LIDOCAINE 5 % EX PTCH
1.0000 | MEDICATED_PATCH | Freq: Every day | CUTANEOUS | Status: DC
  Administered 2016-11-21: 1 via TRANSDERMAL
  Filled 2016-11-21: qty 1

## 2016-11-21 MED ORDER — METOPROLOL TARTRATE 25 MG PO TABS: 12.5000 mg | ORAL_TABLET | Freq: Two times a day (BID) | ORAL | 0 refills | Status: DC

## 2016-11-21 NOTE — Progress Notes (Signed)
D/C instructions reviewed w/ pt and dtr. Both verbalize understanding and all questions answered. Pt d/c in stable condition in w/c by NT to dtr's car. Pt in possession of d/c instructions, scripts, and all personal belongings.

## 2016-11-21 NOTE — Discharge Summary (Signed)
Barry Mccoy, is a 66 y.o. male  DOB Jan 25, 1951  MRN [id number].  Admission date:  11/16/2016  Admitting Physician  Modena Jansky, MD  Discharge Date:  11/21/2016   Primary MD  Karis Juba, PA-C  Recommendations for primary care physician for things to follow:  - Please  check CBC, BMP during next visit   Admission Diagnosis  Gastrointestinal hemorrhage with melena [K92.1] Other acute pancreatitis without infection or necrosis [K85.80]   Discharge Diagnosis  Gastrointestinal hemorrhage with melena [K92.1] Other acute pancreatitis without infection or necrosis [K85.80]    Principal Problem:   Acute GI bleeding Active Problems:   Acute blood loss anemia   Acute pancreatitis      Past Medical History:  Diagnosis Date  . Anxiety   . BPH (benign prostatic hyperplasia)   . Cervical spine disease   . Chronic kidney disease    kidney stones  . Depression   . Disorder of lumbar spine   . ETOH abuse   . GI bleed   . Muscle spasm     Past Surgical History:  Procedure Laterality Date  . COLONOSCOPY WITH PROPOFOL N/A 11/08/2016   Procedure: COLONOSCOPY WITH PROPOFOL;  Surgeon: Wilford Corner, MD;  Location: [facility];  Service: Endoscopy;  Laterality: N/A;  . ELBOW SURGERY     as child  . ELBOW SURGERY    . ENTEROSCOPY N/A 11/19/2016   Procedure: ENTEROSCOPY;  Surgeon: Wonda Horner, MD;  Location: [facility];  Service: Endoscopy;  Laterality: N/A;  . ESOPHAGOGASTRODUODENOSCOPY N/A 10/04/2016   Procedure: ESOPHAGOGASTRODUODENOSCOPY (EGD);  Surgeon: Carol Ada, MD;  Location: [place];  Service: Gastroenterology;  Laterality: N/A;  . GIVENS CAPSULE STUDY N/A 11/09/2016   Procedure: GIVENS CAPSULE STUDY;  Surgeon: Wilford Corner, MD;  Location: [facility];  Service: Endoscopy;  Laterality: N/A;       History of present illness and  Hospital Course:     Kindly see H&P for  history of present illness and admission details, please review complete Labs, Consult reports and Test reports for all details in brief  HPI  from the history and physical done on the day of admission 11/16/2016  HPI: Barry Mccoy is a 67 year old male, lives with his daughter, independent of activities of daily living, PMH of alcohol abuse (quit ? 1 year ago), tobacco abuse (quit 2 months ago), anxiety, depression, stage II chronic kidney disease, recent couple of hospitalizations(1/22-2/13, 2/26-3/3), for acute GI bleed & symptomatic anemia, PRBC's transfusions, intubation and mechanical ventilation, alcohol withdrawal delirium, just discharged from the hospital on 11/10/16, presented to [facility] ED with above complaints. He has undergone extensive evaluation by GI including EGD, colonoscopy and capsule endoscopy in the last 2 months without clear source. Since returning home, he had couple days of reasonable health, brown-colored stools but over the last 48 hours, noted subacute onset of left-sided abdominal pain, chronic, throbbing, 5/10 in severity, with episodes of acute pain 9/10 where he has to double up and had an episode where he  almost passed out before he went into PCPs office on 3/8. 3 nonbloody emesis 2 nights ago. Appetite was good up to 48 hours ago but since then his borderline. Has been having 1-2 episodes of black tarry stools for the last 3-4 days. Progressively weaker, dizzy and lightheadedness especially on upright position. Seen at PCPs office yesterday and as per family's report, patient had soft pressures in the 90s and hemoglobin was 8.4 and patient was referred to hematology and gastroenterology as outpatient. Due to worsening symptoms, patient and family decided to come to the ED today.  ED Course: Hemoglobin has dropped to 7.2 from 8.4 yesterday. CMP unremarkable. Lipase minimally elevated. CT abdomen and pelvis with contrast: Suggestive of focal pancreatitis, pseudocyst  formation and obstructing calculus in the distal aspect of the pancreas. Patient has received IV fluid bolus, pain management in the ED. [facility] consulted.   Hospital Course   66 year old male, lives with his daughter, independent of activities of daily living, PMH of alcohol abuse (quit ? 1 year ago), tobacco abuse (quit 2 months ago), anxiety, depression, stage II chronic kidney disease, recent couple of hospitalizations(1/22-2/13, 2/26-3/3),for acute GI bleed &symptomatic anemia; who presented with abd pain, black stools and symptomatic anemia symptoms (lightheadedness, fatigue/weakness and dizziness).  1. Subacute GI bleed: Has been extensively evaluated by [facility] including EGD, colonoscopy and capsule endoscopy over the 2 prior admissions in the last 2 months without clear source. S/P another EGD on 3/12 (no abnormalities seen), will continue Protonix 40 mg oral twice a day, Retin-A regular diet, no evidence of recurrent GI bleed, cleared from GI for discharge with recommendation for outpatient follow-up 2. Acute blood loss anemia, symptomatic:recent extensive work up and no source identified. S/p 2 unit PRBC's. Hgb remained stable after transfusion, today is 8.3 Patient received IV iron per pharmacy X 1 on 11/17/16. Patient reported no dark stools or BRPR. . Will start niferex BID and B12 (last one low normal) on discharge 3. LUQ abdominal pain/possible focal pancreatitis with pseudocyst formation and obstructing calculus: New onset abdominal pain 48 hours prior to admission. 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 Lipase was only minimally elevated, now WNL. Tolerating diet as indicated by GI service.  Planning repeat CT scan to re-evaluate pseudocyst in the next week or so (outpatient follow up with Dr. Amedeo Plenty in 1-2 weeks). 4. History of alcohol dependence:As per patient, alcohol free for over a year now. Will Continue  multivitamins. No signs of withdrawal. 5. History of tobacco abuse:States that he quit smoking on 10/01/16. Encourage to keep himself smoking free. 6. Essential hypertension: Soft blood pressure, but stable. Continue metoprolol  with holding parameters. 7. BPH: Continue home medications. No signs of symptoms for urinary retention. 8. SVT:  he was noticed to have a few runs of SVT on tele , he was asymptomatic, so he will be resumed on metoprolol, but will decrease dose to 12.5 mg twice a day   Discharge Condition:  Stable   Follow UP  Follow-up Information    DIXON,MARY BETH, PA-C Follow up in 1 week(s).   Specialty:  Physician Assistant Contact information: [place] [address] [place] [address] [phone]        HAYES,JOHN C, MD Follow up in 2 week(s).   Specialty:  Gastroenterology Contact information: [address] [address] [phone]             Discharge Instructions  and  Discharge Medications     Discharge Instructions    Discharge instructions    Complete by:  As directed    Follow with Primary MD DIXON,MARY BETH, PA-C in 7 days   Get CBC, CMP,  checked  by Primary MD next visit.    Activity: As tolerated with Full fall precautions use walker/cane & assistance as needed   Disposition Home    Diet: Heart Healthy  , with feeding assistance and aspiration precautions.  For Heart failure patients - Check your Weight same time everyday, if you gain over 2 pounds, or you develop in leg swelling, experience more shortness of breath or chest pain, call your Primary MD immediately. Follow Cardiac Low Salt Diet and 1.5 lit/day fluid restriction.   On your next visit with your primary care physician please Get Medicines reviewed and adjusted.   Please request your Prim.MD to go over all Hospital Tests and Procedure/Radiological results at the follow up, please get all Hospital records sent to your Prim MD by signing  hospital release before you go home.   If you experience worsening of your admission symptoms, develop shortness of breath, life threatening emergency, suicidal or homicidal thoughts you must seek medical attention immediately by calling 911 or calling your MD immediately  if symptoms less severe.  You Must read complete instructions/literature along with all the possible adverse reactions/side effects for all the Medicines you take and that have been prescribed to you. Take any new Medicines after you have completely understood and accpet all the possible adverse reactions/side effects.   Do not drive, operating heavy machinery, perform activities at heights, swimming or participation in water activities or provide baby sitting services if your were admitted for syncope or siezures until you have seen by Primary MD or a Neurologist and advised to do so again.  Do not drive when taking Pain medications.    Do not take more than prescribed Pain, Sleep and Anxiety Medications  Special Instructions: If you have smoked or chewed Tobacco  in the last 2 yrs please stop smoking, stop any regular Alcohol  and or any Recreational drug use.  Wear Seat belts while driving.   Please note  You were cared for by a hospitalist during your hospital stay. If you have any questions about your discharge medications or the care you received while you were in the hospital after you are discharged, you can call the unit and asked to speak with the hospitalist on call if the hospitalist that took care of you is not available. Once you are discharged, your primary care physician will handle any further medical issues. Please note that NO REFILLS for any discharge medications will be authorized once you are discharged, as it is imperative that you return to your primary care physician (or establish a relationship with a primary care physician if you do not have one) for your aftercare needs so that they can reassess  your need for medications and monitor your lab values.   Increase activity slowly    Complete by:  As directed      Allergies as of 11/21/2016      Reactions   Shellfish Allergy Other (See Comments)   Unknown       Medication List    STOP taking these medications   aspirin EC 81 MG tablet     TAKE these medications   acetaminophen 325 MG tablet Commonly known as:  TYLENOL Take 2 tablets (650 mg total) by mouth every  6 (six) hours as needed for mild pain, moderate pain or headache (or Fever >/= 101).   baclofen 20 MG tablet Commonly known as:  LIORESAL TAKE 1 TABLET BY MOUTH EVERY 12 HOURS AS NEEDED FOR SPASMS   BOTOX IJ Inject as directed See admin instructions. Botox injections done at Dr. Audery Amel office approximately every 90 days   cyanocobalamin 1000 MCG tablet Take 1 tablet (1,000 mcg total) by mouth daily. Start taking on:  3/79/0240   folic acid 1 MG tablet Commonly known as:  FOLVITE Take 1 tablet (1 mg total) by mouth daily. What changed:  when to take this   iron polysaccharides 150 MG capsule Commonly known as:  NIFEREX Take 1 capsule (150 mg total) by mouth 2 (two) times daily.   lidocaine 5 % Commonly known as:  LIDODERM Place 1 patch onto the skin every 12 (twelve) hours as needed (pain).   metoprolol tartrate 25 MG tablet Commonly known as:  LOPRESSOR Take 0.5 tablets (12.5 mg total) by mouth 2 (two) times daily. What changed:  how much to take   multivitamin with minerals Tabs tablet Take 1 tablet by mouth at bedtime.   pantoprazole 40 MG tablet Commonly known as:  PROTONIX Take 1 tablet (40 mg total) by mouth 2 (two) times daily. What changed:  when to take this   polyethylene glycol packet Commonly known as:  MIRALAX / GLYCOLAX Take 17 g by mouth daily as needed for mild constipation.   QUEtiapine 50 MG tablet Commonly known as:  SEROQUEL Take 1 tablet (50 mg total) by mouth at bedtime.   sertraline 50 MG tablet Commonly known as:   ZOLOFT Take 1 tablet (50 mg total) by mouth at bedtime.   tamsulosin 0.4 MG Caps capsule Commonly known as:  FLOMAX Take 1 capsule (0.4 mg total) by mouth at bedtime.   thiamine 100 MG tablet Take 1 tablet (100 mg total) by mouth daily. For 4days then down to 100mg  daily What changed:  how much to take  additional instructions   traZODone 100 MG tablet Commonly known as:  DESYREL Take 1 tablet (100 mg total) by mouth at bedtime.   Vitamin D-3 5000 units Tabs Take 5,000 Units by mouth daily.         Diet and Activity recommendation: See Discharge Instructions above   Consults obtained -  GI   Major procedures and Radiology Reports - PLEASE review detailed and final reports for all details, in brief -   EGD done on 11/19/16  Ct Head Wo Contrast  Result Date: 10/24/2016 CLINICAL DATA:  Anxiety, depression, alcohol abuse, confusion, agitation EXAM: CT HEAD WITHOUT CONTRAST TECHNIQUE: Contiguous axial images were obtained from the base of the skull through the vertex without intravenous contrast. COMPARISON:  Brain MRI 01/22/2014 FINDINGS: Brain: No intracranial hemorrhage, mass effect or midline shift. No acute cortical infarction. Stable cerebral atrophy. Stable periventricular and patchy subcortical chronic white matter disease. Prominent perivascular space or lacunar infarct in right basal ganglia is stable. No mass lesion is noted on this unenhanced scan. Ventricular size is stable from prior exam. Vascular: Mild atherosclerotic calcifications of carotid siphon. Skull: No skull fracture is noted. Sinuses/Orbits: No acute findings. Other: None IMPRESSION: No acute intracranial abnormality. Stable cerebral atrophy. Stable periventricular and patchy subcortical white matter decreased attenuation probable due to chronic small vessel ischemic changes. No definite acute cortical infarction. Stable prominent perivascular space or old lacunar infarct in right basal ganglia.  Electronically Signed   By:  Lahoma Crocker M.D.   On: 10/24/2016 11:30   Mr Brain Wo Contrast  Result Date: 10/26/2016 CLINICAL DATA:  66 y/o  M; acute confusion and agitation. EXAM: MRI HEAD WITHOUT CONTRAST TECHNIQUE: Multiplanar, multiecho pulse sequences of the brain and surrounding structures were obtained without intravenous contrast. COMPARISON:  10/24/2016 CT of the head. 01/22/2014 MRI of the brain. FINDINGS: Brain: No acute infarction, hemorrhage, hydrocephalus, extra-axial collection or mass lesion. Foci of T2 FLAIR hyperintense signal abnormality in subcortical and periventricular white matter is stable and compatible with moderate chronic microvascular ischemic changes. There is stable moderate diffuse brain parenchymal volume loss. Small foci of T2 FLAIR hyperintensity within the pons are compatible with small chronic lacunar infarcts. There are large prominent perivascular spaces in the right lentiform nucleus. Vascular: Normal flow voids. Skull and upper cervical spine: Normal marrow signal. Sinuses/Orbits: No abnormal signal of paranasal sinuses. Underpneumatized frontal sinuses. Small right mastoid effusion. No abnormal signal of left mastoid air cells. Orbits are unremarkable. Other: None. IMPRESSION: 1. No acute intracranial abnormality. 2. Stable moderate chronic microvascular ischemic changes and parenchymal volume loss of the brain. 3. Small right mastoid effusion. Electronically Signed   By: Kristine Garbe M.D.   On: 10/26/2016 19:24   Ct Abdomen Pelvis W Contrast  Result Date: 11/16/2016 CLINICAL DATA:  Left-sided abdominal pain for 2 days EXAM: CT ABDOMEN AND PELVIS WITH CONTRAST TECHNIQUE: Multidetector CT imaging of the abdomen and pelvis was performed using the standard protocol following bolus administration of intravenous contrast. CONTRAST:  167mL ISOVUE-300 IOPAMIDOL (ISOVUE-300) INJECTION 61% COMPARISON:  None. FINDINGS: Lower chest: Within normal limits. Hepatobiliary:  The liver is within normal limits. Multiple gallstones are noted within the gallbladder. Pancreas: Distal pancreas demonstrates a calcification within. This may represent an obstructive stone and would correlate with the distal abnormality within the tail of the pancreas described below. Spleen: Within normal limits. Adrenals/Urinary Tract: The adrenal glands are unremarkable. The kidneys demonstrates some scattered small nonobstructing stones in the right kidney. The collecting systems and ureters are within normal limits. The bladder is well distended. Stomach/Bowel: Diverticular change of the colon is noted with evidence of inflammatory changes near the splenic flexure. This is likely reactive to the adjacent fluid collection described below. The appendix is within normal limits. Vascular/Lymphatic: Aortic atherosclerosis. No enlarged abdominal or pelvic lymph nodes. Reproductive: Prostate is unremarkable. Other: Multiloculated fluid collection measuring approximately 8.8 by 4.2 by 4.0 cm . Small amount of fluid is noted adjacent to the posterior aspect of the stomach as well. It measures approximately 3.3 cm in greatest dimension. Musculoskeletal: No acute or significant osseous findings. IMPRESSION: Multiloculated fluid collection in the left upper quadrant beneath the spleen and adjacent to the tail of the pancreas and splenic flexure of the colon. Given the elevated lipase these changes likely represent focal pancreatitis and pseudocyst formation. Calcification is noted in the distal aspect of the pancreas likely representing an obstructing calculus. Electronically Signed   By: Inez Catalina M.D.   On: 11/16/2016 15:20   Dg Chest Portable 1 View  Result Date: 11/05/2016 CLINICAL DATA:  Acute onset of decreased hemoglobin. GI bleeding. Generalized weakness. Initial encounter. EXAM: PORTABLE CHEST 1 VIEW COMPARISON:  CT of the thoracic spine performed 07/21/2012 FINDINGS: The lungs are well-aerated. Vascular  congestion is noted. Peribronchial thickening is seen. There is no evidence of pleural effusion or pneumothorax. The cardiomediastinal silhouette is within normal limits. No acute osseous abnormalities are seen. IMPRESSION: Vascular congestion.  Peribronchial thickening seen. Electronically  Signed   By: Garald Balding M.D.   On: 11/05/2016 23:45    Micro Results     Recent Results (from the past 240 hour(s))  MRSA PCR Screening     Status: None   Collection Time: 11/17/16 12:34 AM  Result Value Ref Range Status   MRSA by PCR NEGATIVE NEGATIVE Final    Comment:        The GeneXpert MRSA Assay (FDA approved for NASAL specimens only), is one component of a comprehensive MRSA colonization surveillance program. It is not intended to diagnose MRSA infection nor to guide or monitor treatment for MRSA infections.        Today   Subjective:   Barry Mccoy today has no headache,no chest or abdominal pain, ambulating with no lightheadedness or dizziness, no new weakness tingling or numbness, feels much better wants to go home today.  Objective:   Blood pressure 107/61, pulse 70, temperature 97.9 F (36.6 C), temperature source Oral, resp. rate 16, height 6\' 2"  (1.88 m), weight 97.8 kg (215 lb 9.8 oz), SpO2 97 %.   Intake/Output Summary (Last 24 hours) at 11/21/16 1207 Last data filed at 11/21/16 0740  Gross per 24 hour  Intake              960 ml  Output                0 ml  Net              960 ml    Exam Awake Alert, Oriented x 3, No new F.N deficits, Normal affect Supple Neck,No JVD,.  Symmetrical Chest wall movement, Good air movement bilaterally, CTAB RRR,No Gallops,No Parasternal Heave +ve B.Sounds, Abd Soft, Non tender, No rebound -guarding or rigidity. No Cyanosis, Clubbing or edema,  Data Review   CBC w Diff: Lab Results  Component Value Date   WBC 7.1 11/21/2016   HGB 8.3 (L) 11/21/2016   HCT 27.4 (L) 11/21/2016   HCT 26.4 (L) 10/03/2016   PLT 177  11/21/2016   LYMPHOPCT 28 11/18/2016   MONOPCT 11 11/18/2016   EOSPCT 1 11/18/2016   BASOPCT 0 11/18/2016    CMP: Lab Results  Component Value Date   NA 132 (L) 11/21/2016   K 3.9 11/21/2016   CL 98 (L) 11/21/2016   CO2 24 11/21/2016   BUN 7 11/21/2016   CREATININE 0.76 11/21/2016   CREATININE 1.06 11/05/2016   PROT 5.8 (L) 11/17/2016   ALBUMIN 3.1 (L) 11/17/2016   BILITOT 1.0 11/17/2016   ALKPHOS 63 11/17/2016   AST 17 11/17/2016   ALT 13 (L) 11/17/2016  .   Total Time in preparing paper work, data evaluation and todays exam - 35 minutes  Chelsey Redondo M.D on 11/21/2016 at 12:07 PM  [facility]   Office  [phone]

## 2016-11-21 NOTE — Care Management Important Message (Signed)
Important Message  Patient Details  Name: Barry Mccoy MRN: [id number] Date of Birth: December 27, 1950   Medicare Important Message Given:  Yes    Kerin Salen 11/21/2016, 10:48 AMImportant Message  Patient Details  Name: Barry Mccoy MRN: [id number] Date of Birth: May 12, 1951   Medicare Important Message Given:  Yes    Kerin Salen 11/21/2016, 10:48 AM

## 2016-11-21 NOTE — Discharge Instructions (Signed)
Follow with Primary MD Barry Mccoy,Barry BETH, PA-C in 7 days   Get CBC, CMP,  checked  by Primary MD next visit.    Activity: As tolerated with Full fall precautions use walker/cane & assistance as needed   Disposition Home    Diet: Heart Healthy  , with feeding assistance and aspiration precautions.  For Heart failure patients - Check your Weight same time everyday, if you gain over 2 pounds, or you develop in leg swelling, experience more shortness of breath or chest pain, call your Primary MD immediately. Follow Cardiac Low Salt Diet and 1.5 lit/day fluid restriction.   On your next visit with your primary care physician please Get Medicines reviewed and adjusted.   Please request your Prim.MD to go over all Hospital Tests and Procedure/Radiological results at the follow up, please get all Hospital records sent to your Prim MD by signing hospital release before you go home.   If you experience worsening of your admission symptoms, develop shortness of breath, life threatening emergency, suicidal or homicidal thoughts you must seek medical attention immediately by calling 911 or calling your MD immediately  if symptoms less severe.  You Must read complete instructions/literature along with all the possible adverse reactions/side effects for all the Medicines you take and that have been prescribed to you. Take any new Medicines after you have completely understood and accpet all the possible adverse reactions/side effects.   Do not drive, operating heavy machinery, perform activities at heights, swimming or participation in water activities or provide baby sitting services if your were admitted for syncope or siezures until you have seen by Primary MD or a Neurologist and advised to do so again.  Do not drive when taking Pain medications.    Do not take more than prescribed Pain, Sleep and Anxiety Medications  Special Instructions: If you have smoked or chewed Tobacco  in the last 2 yrs  please stop smoking, stop any regular Alcohol  and or any Recreational drug use.  Wear Seat belts while driving.   Please note  You were cared for by a hospitalist during your hospital stay. If you have any questions about your discharge medications or the care you received while you were in the hospital after you are discharged, you can call the unit and asked to speak with the hospitalist on call if the hospitalist that took care of you is not available. Once you are discharged, your primary care physician will handle any further medical issues. Please note that NO REFILLS for any discharge medications will be authorized once you are discharged, as it is imperative that you return to your primary care physician (or establish a relationship with a primary care physician if you do not have one) for your aftercare needs so that they can reassess your need for medications and monitor your lab values.

## 2016-11-26 ENCOUNTER — Other Ambulatory Visit: Payer: Self-pay | Admitting: Physician Assistant

## 2016-11-26 NOTE — Telephone Encounter (Signed)
Refill appropriate 

## 2016-11-27 DIAGNOSIS — D649 Anemia, unspecified: Secondary | ICD-10-CM | POA: Diagnosis not present

## 2016-11-27 DIAGNOSIS — Z0000 Encounter for general adult medical examination without abnormal findings: Secondary | ICD-10-CM | POA: Diagnosis not present

## 2016-11-27 DIAGNOSIS — G4709 Other insomnia: Secondary | ICD-10-CM | POA: Diagnosis not present

## 2016-11-27 DIAGNOSIS — K863 Pseudocyst of pancreas: Secondary | ICD-10-CM | POA: Diagnosis not present

## 2016-11-27 DIAGNOSIS — G9340 Encephalopathy, unspecified: Secondary | ICD-10-CM | POA: Diagnosis not present

## 2016-11-27 DIAGNOSIS — G43011 Migraine without aura, intractable, with status migrainosus: Secondary | ICD-10-CM | POA: Diagnosis not present

## 2016-11-27 DIAGNOSIS — N400 Enlarged prostate without lower urinary tract symptoms: Secondary | ICD-10-CM | POA: Diagnosis not present

## 2016-11-27 DIAGNOSIS — R252 Cramp and spasm: Secondary | ICD-10-CM | POA: Diagnosis not present

## 2016-11-29 ENCOUNTER — Ambulatory Visit: Payer: Medicare Other | Admitting: Physician Assistant

## 2016-11-30 DIAGNOSIS — D509 Iron deficiency anemia, unspecified: Secondary | ICD-10-CM | POA: Diagnosis not present

## 2016-11-30 DIAGNOSIS — K921 Melena: Secondary | ICD-10-CM | POA: Diagnosis not present

## 2016-12-04 DIAGNOSIS — N400 Enlarged prostate without lower urinary tract symptoms: Secondary | ICD-10-CM | POA: Diagnosis not present

## 2016-12-04 DIAGNOSIS — K863 Pseudocyst of pancreas: Secondary | ICD-10-CM | POA: Diagnosis not present

## 2016-12-04 DIAGNOSIS — D649 Anemia, unspecified: Secondary | ICD-10-CM | POA: Diagnosis not present

## 2016-12-04 DIAGNOSIS — G9340 Encephalopathy, unspecified: Secondary | ICD-10-CM | POA: Diagnosis not present

## 2017-01-16 DIAGNOSIS — E785 Hyperlipidemia, unspecified: Secondary | ICD-10-CM | POA: Diagnosis not present

## 2017-01-16 DIAGNOSIS — R7301 Impaired fasting glucose: Secondary | ICD-10-CM | POA: Diagnosis not present

## 2017-01-16 DIAGNOSIS — D509 Iron deficiency anemia, unspecified: Secondary | ICD-10-CM | POA: Diagnosis not present

## 2017-01-16 DIAGNOSIS — Z1159 Encounter for screening for other viral diseases: Secondary | ICD-10-CM | POA: Diagnosis not present

## 2017-01-16 DIAGNOSIS — N400 Enlarged prostate without lower urinary tract symptoms: Secondary | ICD-10-CM | POA: Diagnosis not present

## 2017-01-16 DIAGNOSIS — Z7689 Persons encountering health services in other specified circumstances: Secondary | ICD-10-CM | POA: Diagnosis not present

## 2017-01-16 DIAGNOSIS — K863 Pseudocyst of pancreas: Secondary | ICD-10-CM | POA: Diagnosis not present

## 2017-02-12 DIAGNOSIS — G3184 Mild cognitive impairment, so stated: Secondary | ICD-10-CM | POA: Diagnosis not present

## 2017-02-12 DIAGNOSIS — F4321 Adjustment disorder with depressed mood: Secondary | ICD-10-CM | POA: Diagnosis not present

## 2017-02-22 DIAGNOSIS — Z8719 Personal history of other diseases of the digestive system: Secondary | ICD-10-CM | POA: Diagnosis not present

## 2017-02-22 DIAGNOSIS — G4700 Insomnia, unspecified: Secondary | ICD-10-CM | POA: Diagnosis not present

## 2017-02-22 DIAGNOSIS — F329 Major depressive disorder, single episode, unspecified: Secondary | ICD-10-CM | POA: Diagnosis not present

## 2017-02-22 DIAGNOSIS — K5732 Diverticulitis of large intestine without perforation or abscess without bleeding: Secondary | ICD-10-CM | POA: Diagnosis not present

## 2017-02-22 DIAGNOSIS — K5730 Diverticulosis of large intestine without perforation or abscess without bleeding: Secondary | ICD-10-CM | POA: Diagnosis not present

## 2017-02-22 DIAGNOSIS — Z87891 Personal history of nicotine dependence: Secondary | ICD-10-CM | POA: Diagnosis not present

## 2017-02-22 DIAGNOSIS — N400 Enlarged prostate without lower urinary tract symptoms: Secondary | ICD-10-CM | POA: Diagnosis not present

## 2017-02-22 DIAGNOSIS — R109 Unspecified abdominal pain: Secondary | ICD-10-CM | POA: Diagnosis not present

## 2017-02-22 DIAGNOSIS — Z87442 Personal history of urinary calculi: Secondary | ICD-10-CM | POA: Diagnosis not present

## 2017-02-28 DIAGNOSIS — F4321 Adjustment disorder with depressed mood: Secondary | ICD-10-CM | POA: Diagnosis not present

## 2017-02-28 DIAGNOSIS — G3184 Mild cognitive impairment, so stated: Secondary | ICD-10-CM | POA: Diagnosis not present

## 2017-03-01 DIAGNOSIS — G3184 Mild cognitive impairment, so stated: Secondary | ICD-10-CM | POA: Diagnosis not present

## 2017-03-01 DIAGNOSIS — F4321 Adjustment disorder with depressed mood: Secondary | ICD-10-CM | POA: Diagnosis not present

## 2017-06-26 DIAGNOSIS — K5730 Diverticulosis of large intestine without perforation or abscess without bleeding: Secondary | ICD-10-CM | POA: Diagnosis not present

## 2017-06-26 DIAGNOSIS — Z8042 Family history of malignant neoplasm of prostate: Secondary | ICD-10-CM | POA: Diagnosis not present

## 2017-06-26 DIAGNOSIS — F325 Major depressive disorder, single episode, in full remission: Secondary | ICD-10-CM | POA: Diagnosis not present

## 2017-06-26 DIAGNOSIS — G4709 Other insomnia: Secondary | ICD-10-CM | POA: Diagnosis not present

## 2017-06-26 DIAGNOSIS — R109 Unspecified abdominal pain: Secondary | ICD-10-CM | POA: Diagnosis not present

## 2017-06-26 DIAGNOSIS — R972 Elevated prostate specific antigen [PSA]: Secondary | ICD-10-CM | POA: Diagnosis not present

## 2017-06-26 DIAGNOSIS — R161 Splenomegaly, not elsewhere classified: Secondary | ICD-10-CM | POA: Diagnosis not present

## 2017-07-11 DIAGNOSIS — E538 Deficiency of other specified B group vitamins: Secondary | ICD-10-CM | POA: Diagnosis not present

## 2017-07-11 DIAGNOSIS — E785 Hyperlipidemia, unspecified: Secondary | ICD-10-CM | POA: Diagnosis not present

## 2017-07-11 DIAGNOSIS — D509 Iron deficiency anemia, unspecified: Secondary | ICD-10-CM | POA: Diagnosis not present

## 2017-07-11 DIAGNOSIS — R972 Elevated prostate specific antigen [PSA]: Secondary | ICD-10-CM | POA: Diagnosis not present

## 2017-07-16 DIAGNOSIS — K862 Cyst of pancreas: Secondary | ICD-10-CM | POA: Diagnosis not present

## 2017-07-16 DIAGNOSIS — E611 Iron deficiency: Secondary | ICD-10-CM | POA: Diagnosis not present

## 2017-07-16 DIAGNOSIS — G4700 Insomnia, unspecified: Secondary | ICD-10-CM | POA: Diagnosis not present

## 2017-07-16 DIAGNOSIS — R102 Pelvic and perineal pain: Secondary | ICD-10-CM | POA: Diagnosis not present

## 2017-07-16 DIAGNOSIS — K582 Mixed irritable bowel syndrome: Secondary | ICD-10-CM | POA: Diagnosis not present

## 2017-07-16 DIAGNOSIS — K625 Hemorrhage of anus and rectum: Secondary | ICD-10-CM | POA: Diagnosis not present

## 2017-07-16 DIAGNOSIS — K219 Gastro-esophageal reflux disease without esophagitis: Secondary | ICD-10-CM | POA: Diagnosis not present

## 2017-07-24 DIAGNOSIS — Z23 Encounter for immunization: Secondary | ICD-10-CM | POA: Diagnosis not present

## 2017-07-24 DIAGNOSIS — G4709 Other insomnia: Secondary | ICD-10-CM | POA: Diagnosis not present

## 2017-07-24 DIAGNOSIS — E538 Deficiency of other specified B group vitamins: Secondary | ICD-10-CM | POA: Diagnosis not present

## 2017-07-24 DIAGNOSIS — G3184 Mild cognitive impairment, so stated: Secondary | ICD-10-CM | POA: Diagnosis not present

## 2017-07-24 DIAGNOSIS — D509 Iron deficiency anemia, unspecified: Secondary | ICD-10-CM | POA: Diagnosis not present

## 2017-07-24 DIAGNOSIS — E785 Hyperlipidemia, unspecified: Secondary | ICD-10-CM | POA: Diagnosis not present

## 2017-07-24 DIAGNOSIS — D696 Thrombocytopenia, unspecified: Secondary | ICD-10-CM | POA: Diagnosis not present

## 2017-07-24 DIAGNOSIS — F325 Major depressive disorder, single episode, in full remission: Secondary | ICD-10-CM | POA: Diagnosis not present

## 2017-07-24 DIAGNOSIS — I471 Supraventricular tachycardia: Secondary | ICD-10-CM | POA: Diagnosis not present

## 2017-08-07 DIAGNOSIS — D509 Iron deficiency anemia, unspecified: Secondary | ICD-10-CM | POA: Diagnosis not present

## 2017-08-07 DIAGNOSIS — K2270 Barrett's esophagus without dysplasia: Secondary | ICD-10-CM | POA: Diagnosis not present

## 2017-08-07 DIAGNOSIS — B3789 Other sites of candidiasis: Secondary | ICD-10-CM | POA: Diagnosis not present

## 2017-08-07 DIAGNOSIS — Z87891 Personal history of nicotine dependence: Secondary | ICD-10-CM | POA: Diagnosis not present

## 2017-08-07 DIAGNOSIS — G43011 Migraine without aura, intractable, with status migrainosus: Secondary | ICD-10-CM | POA: Diagnosis not present

## 2017-08-07 DIAGNOSIS — K219 Gastro-esophageal reflux disease without esophagitis: Secondary | ICD-10-CM | POA: Diagnosis not present

## 2017-08-07 DIAGNOSIS — F3340 Major depressive disorder, recurrent, in remission, unspecified: Secondary | ICD-10-CM | POA: Diagnosis not present

## 2017-08-07 DIAGNOSIS — E785 Hyperlipidemia, unspecified: Secondary | ICD-10-CM | POA: Diagnosis not present

## 2017-08-07 DIAGNOSIS — K862 Cyst of pancreas: Secondary | ICD-10-CM | POA: Diagnosis not present

## 2017-08-07 DIAGNOSIS — K582 Mixed irritable bowel syndrome: Secondary | ICD-10-CM | POA: Diagnosis not present

## 2017-08-07 DIAGNOSIS — Z6832 Body mass index (BMI) 32.0-32.9, adult: Secondary | ICD-10-CM | POA: Diagnosis not present

## 2017-08-07 DIAGNOSIS — K449 Diaphragmatic hernia without obstruction or gangrene: Secondary | ICD-10-CM | POA: Diagnosis not present

## 2017-08-07 DIAGNOSIS — E669 Obesity, unspecified: Secondary | ICD-10-CM | POA: Diagnosis not present

## 2017-08-07 DIAGNOSIS — K8689 Other specified diseases of pancreas: Secondary | ICD-10-CM | POA: Diagnosis not present

## 2017-08-14 DIAGNOSIS — D479 Neoplasm of uncertain behavior of lymphoid, hematopoietic and related tissue, unspecified: Secondary | ICD-10-CM | POA: Diagnosis not present

## 2017-08-14 DIAGNOSIS — R161 Splenomegaly, not elsewhere classified: Secondary | ICD-10-CM | POA: Diagnosis not present

## 2017-08-14 DIAGNOSIS — R5383 Other fatigue: Secondary | ICD-10-CM | POA: Diagnosis not present

## 2017-08-14 DIAGNOSIS — D72829 Elevated white blood cell count, unspecified: Secondary | ICD-10-CM | POA: Diagnosis not present

## 2017-08-21 DIAGNOSIS — K2270 Barrett's esophagus without dysplasia: Secondary | ICD-10-CM | POA: Diagnosis not present

## 2017-08-21 DIAGNOSIS — Z800 Family history of malignant neoplasm of digestive organs: Secondary | ICD-10-CM | POA: Diagnosis not present

## 2017-08-21 DIAGNOSIS — K859 Acute pancreatitis without necrosis or infection, unspecified: Secondary | ICD-10-CM | POA: Diagnosis not present

## 2017-08-21 DIAGNOSIS — K635 Polyp of colon: Secondary | ICD-10-CM | POA: Diagnosis not present

## 2017-08-21 DIAGNOSIS — R591 Generalized enlarged lymph nodes: Secondary | ICD-10-CM | POA: Diagnosis not present

## 2017-08-21 DIAGNOSIS — K862 Cyst of pancreas: Secondary | ICD-10-CM | POA: Diagnosis not present

## 2017-08-28 DIAGNOSIS — D472 Monoclonal gammopathy: Secondary | ICD-10-CM | POA: Diagnosis not present

## 2017-08-28 DIAGNOSIS — R161 Splenomegaly, not elsewhere classified: Secondary | ICD-10-CM | POA: Diagnosis not present

## 2017-08-28 DIAGNOSIS — C9190 Lymphoid leukemia, unspecified not having achieved remission: Secondary | ICD-10-CM | POA: Diagnosis not present

## 2017-11-20 DIAGNOSIS — F17210 Nicotine dependence, cigarettes, uncomplicated: Secondary | ICD-10-CM | POA: Diagnosis not present

## 2017-11-20 DIAGNOSIS — N400 Enlarged prostate without lower urinary tract symptoms: Secondary | ICD-10-CM | POA: Diagnosis not present

## 2017-11-20 DIAGNOSIS — D7389 Other diseases of spleen: Secondary | ICD-10-CM | POA: Diagnosis not present

## 2017-11-20 DIAGNOSIS — F329 Major depressive disorder, single episode, unspecified: Secondary | ICD-10-CM | POA: Diagnosis not present

## 2017-11-20 DIAGNOSIS — K861 Other chronic pancreatitis: Secondary | ICD-10-CM | POA: Diagnosis not present

## 2017-11-20 DIAGNOSIS — R109 Unspecified abdominal pain: Secondary | ICD-10-CM | POA: Diagnosis not present

## 2017-11-20 DIAGNOSIS — K8689 Other specified diseases of pancreas: Secondary | ICD-10-CM | POA: Diagnosis not present

## 2017-11-20 DIAGNOSIS — Z87442 Personal history of urinary calculi: Secondary | ICD-10-CM | POA: Diagnosis not present

## 2017-11-20 DIAGNOSIS — Z6832 Body mass index (BMI) 32.0-32.9, adult: Secondary | ICD-10-CM | POA: Diagnosis not present

## 2017-11-21 DIAGNOSIS — R161 Splenomegaly, not elsewhere classified: Secondary | ICD-10-CM | POA: Diagnosis not present

## 2017-11-21 DIAGNOSIS — Z800 Family history of malignant neoplasm of digestive organs: Secondary | ICD-10-CM | POA: Diagnosis not present

## 2017-11-21 DIAGNOSIS — K5792 Diverticulitis of intestine, part unspecified, without perforation or abscess without bleeding: Secondary | ICD-10-CM | POA: Diagnosis not present

## 2017-11-21 DIAGNOSIS — K2270 Barrett's esophagus without dysplasia: Secondary | ICD-10-CM | POA: Diagnosis not present

## 2017-11-21 DIAGNOSIS — K8689 Other specified diseases of pancreas: Secondary | ICD-10-CM | POA: Diagnosis not present

## 2017-11-21 DIAGNOSIS — R140 Abdominal distension (gaseous): Secondary | ICD-10-CM | POA: Diagnosis not present

## 2017-11-21 DIAGNOSIS — R1032 Left lower quadrant pain: Secondary | ICD-10-CM | POA: Diagnosis not present

## 2017-11-27 DIAGNOSIS — C9190 Lymphoid leukemia, unspecified not having achieved remission: Secondary | ICD-10-CM | POA: Diagnosis not present

## 2017-11-27 DIAGNOSIS — K863 Pseudocyst of pancreas: Secondary | ICD-10-CM | POA: Diagnosis not present

## 2017-12-02 DIAGNOSIS — C9190 Lymphoid leukemia, unspecified not having achieved remission: Secondary | ICD-10-CM | POA: Diagnosis not present

## 2017-12-02 DIAGNOSIS — K863 Pseudocyst of pancreas: Secondary | ICD-10-CM | POA: Diagnosis not present

## 2017-12-08 DIAGNOSIS — K863 Pseudocyst of pancreas: Secondary | ICD-10-CM | POA: Diagnosis not present

## 2017-12-08 DIAGNOSIS — C9190 Lymphoid leukemia, unspecified not having achieved remission: Secondary | ICD-10-CM | POA: Diagnosis not present

## 2017-12-08 DIAGNOSIS — C9110 Chronic lymphocytic leukemia of B-cell type not having achieved remission: Secondary | ICD-10-CM | POA: Diagnosis not present

## 2017-12-08 DIAGNOSIS — S3609XA Other injury of spleen, initial encounter: Secondary | ICD-10-CM | POA: Diagnosis not present

## 2017-12-18 DIAGNOSIS — F325 Major depressive disorder, single episode, in full remission: Secondary | ICD-10-CM | POA: Diagnosis not present

## 2017-12-18 DIAGNOSIS — R161 Splenomegaly, not elsewhere classified: Secondary | ICD-10-CM | POA: Diagnosis not present

## 2017-12-18 DIAGNOSIS — F4310 Post-traumatic stress disorder, unspecified: Secondary | ICD-10-CM | POA: Diagnosis not present

## 2018-01-12 IMAGING — CR DG CHEST 1V PORT
2 series · 2 of 2 positions shown · non-contrast
Comparison: 10/04/2016

CLINICAL DATA: Check endotracheal tube

EXAM:
PORTABLE CHEST 1 VIEW

[AP (1 of 2)]
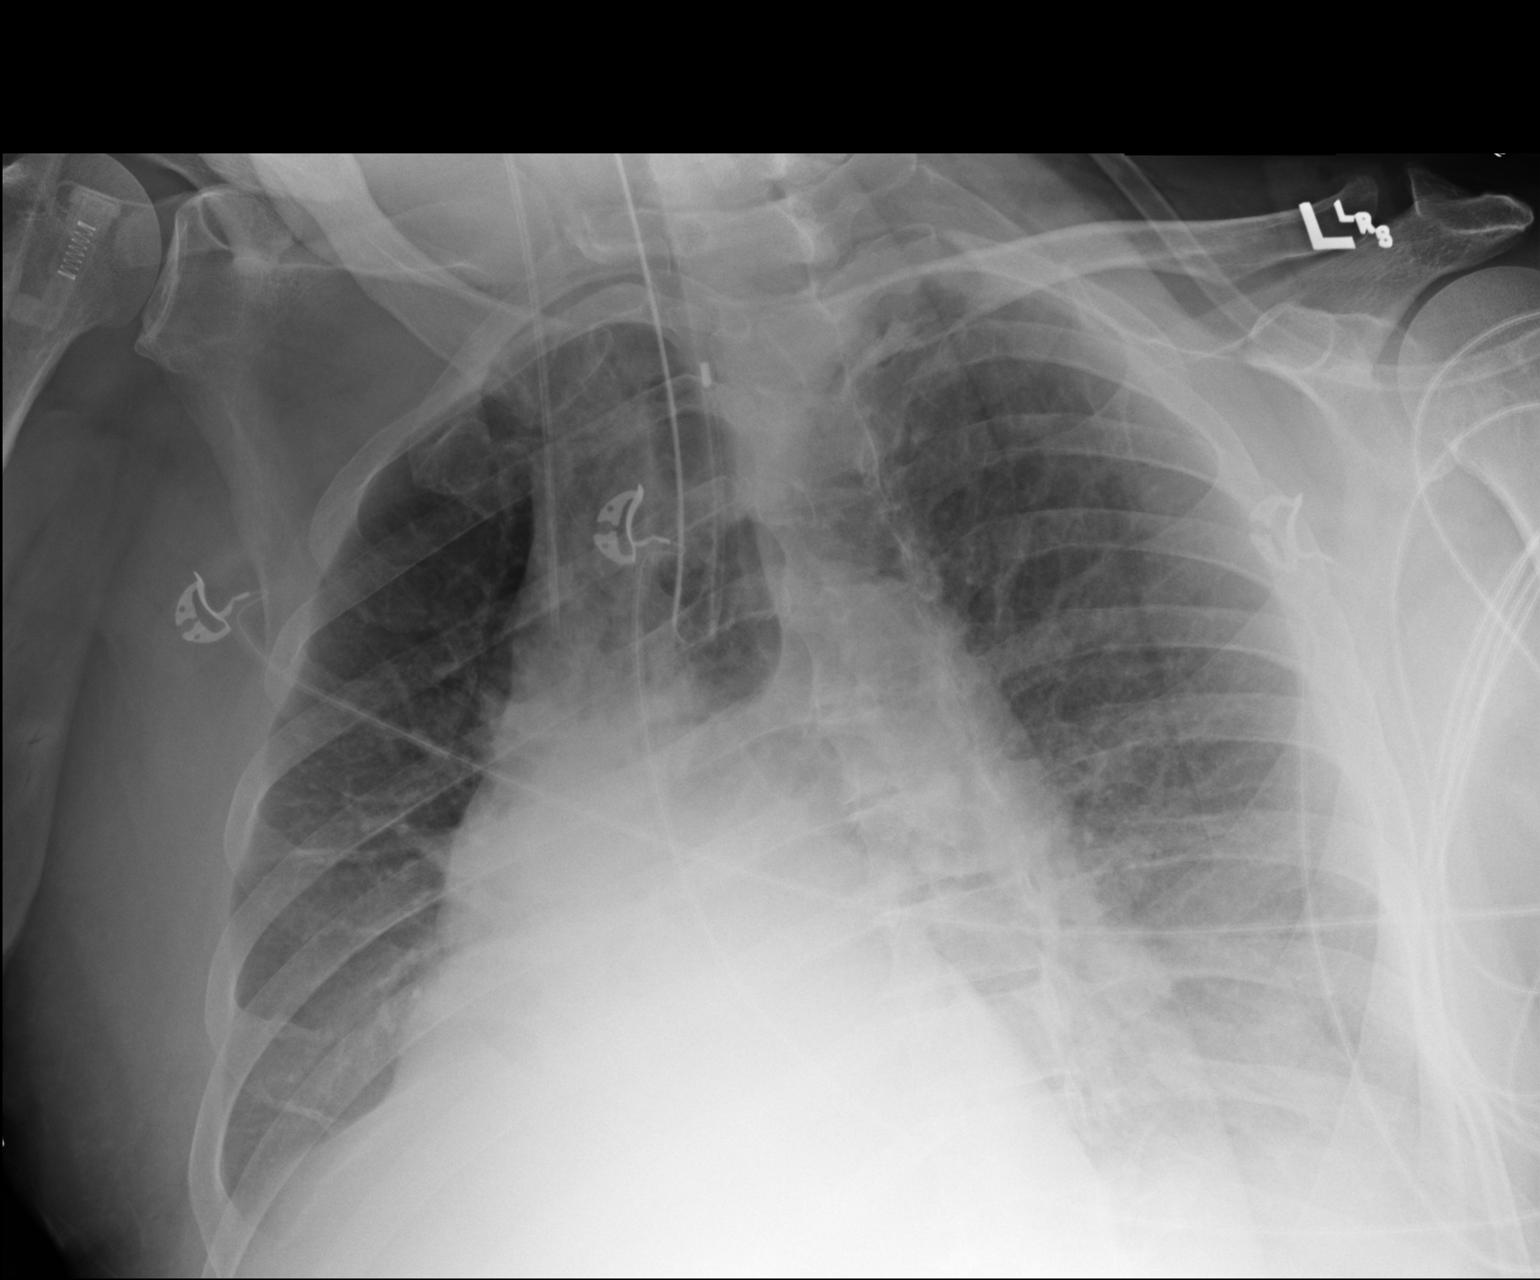

[AP (2 of 2)]
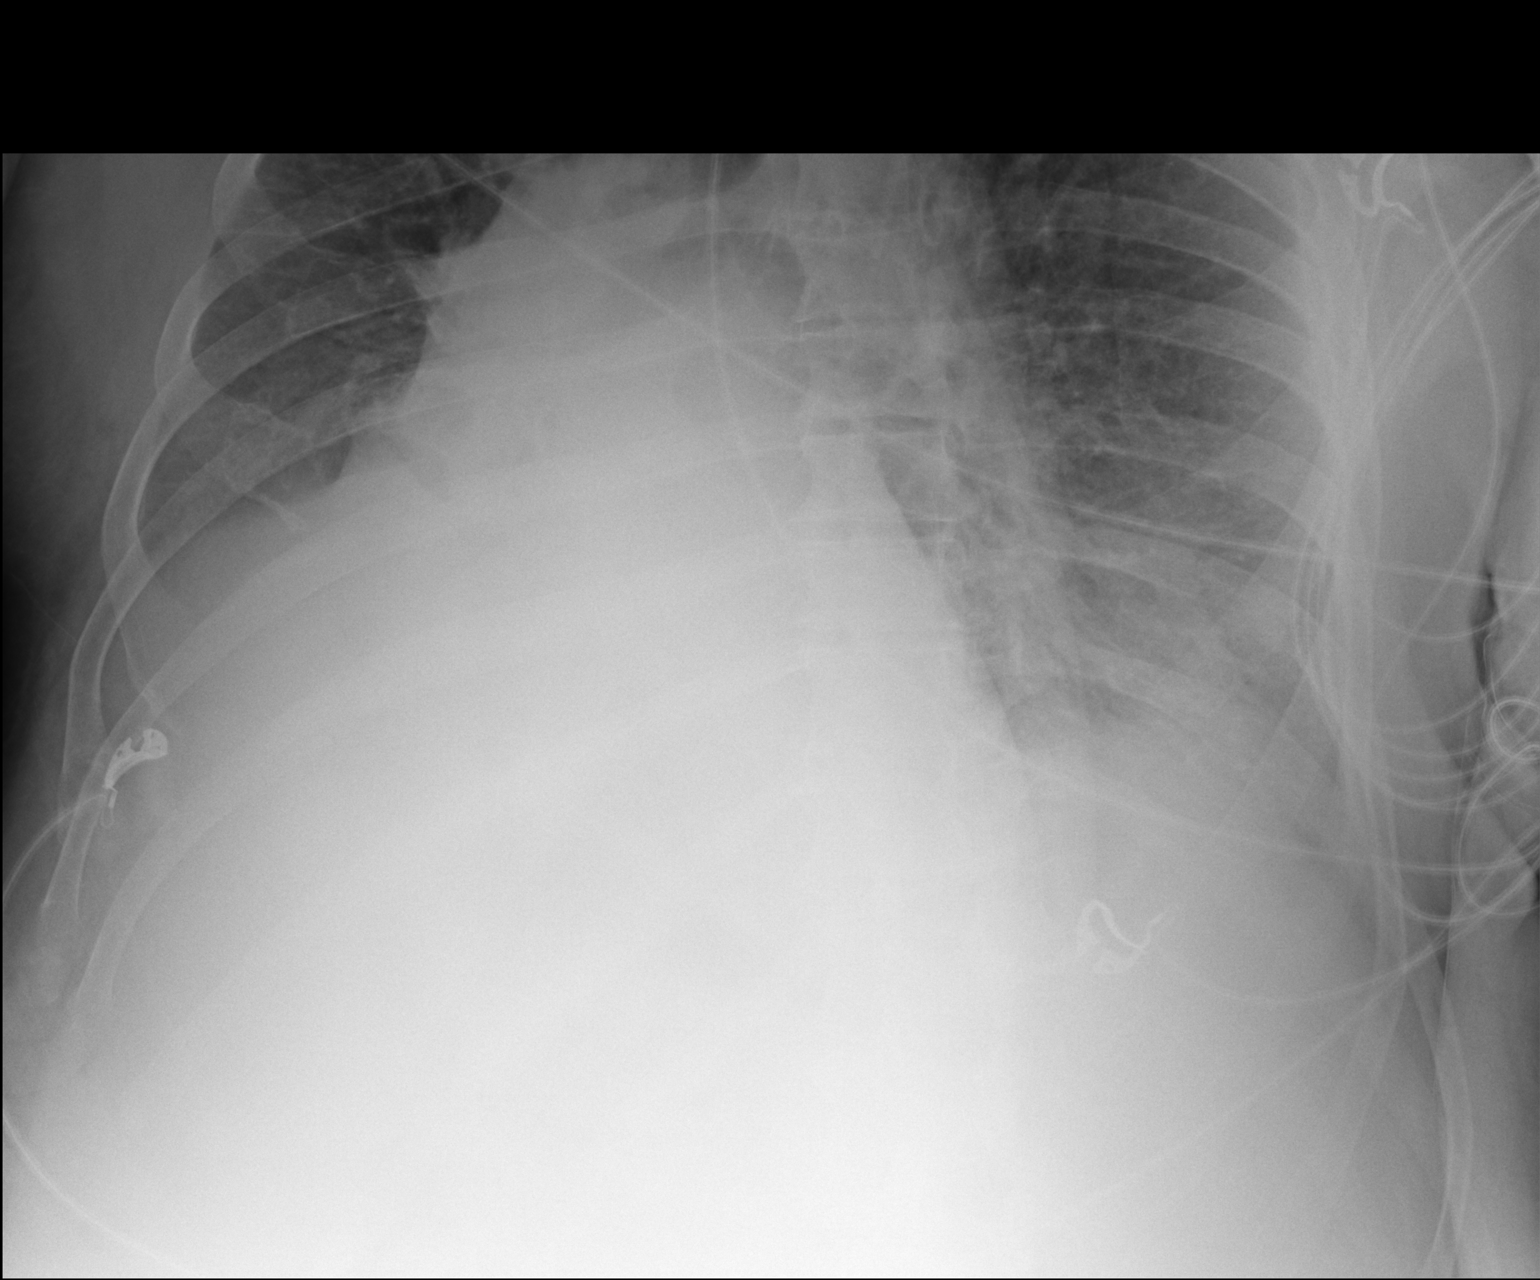

[2 of 2 positions shown; findings below may reference images not displayed]

FINDINGS: Endotracheal tube and right jugular central line are again seen and
stable. Cardiac shadow is within normal limits. Patients significant
rotated to the right accentuating the mediastinal markings. Small
bilateral pleural effusions are noted. No other focal infiltrate is
seen.
IMPRESSION: Endotracheal tube in satisfactory position. Small bilateral pleural
effusions are noted.

## 2018-01-13 IMAGING — CR DG CHEST 1V PORT
1 series · 1 of 1 positions shown · non-contrast
Comparison: 10/05/2016

CLINICAL DATA: Endotracheal tube

EXAM:
PORTABLE CHEST 1 VIEW

[AP]
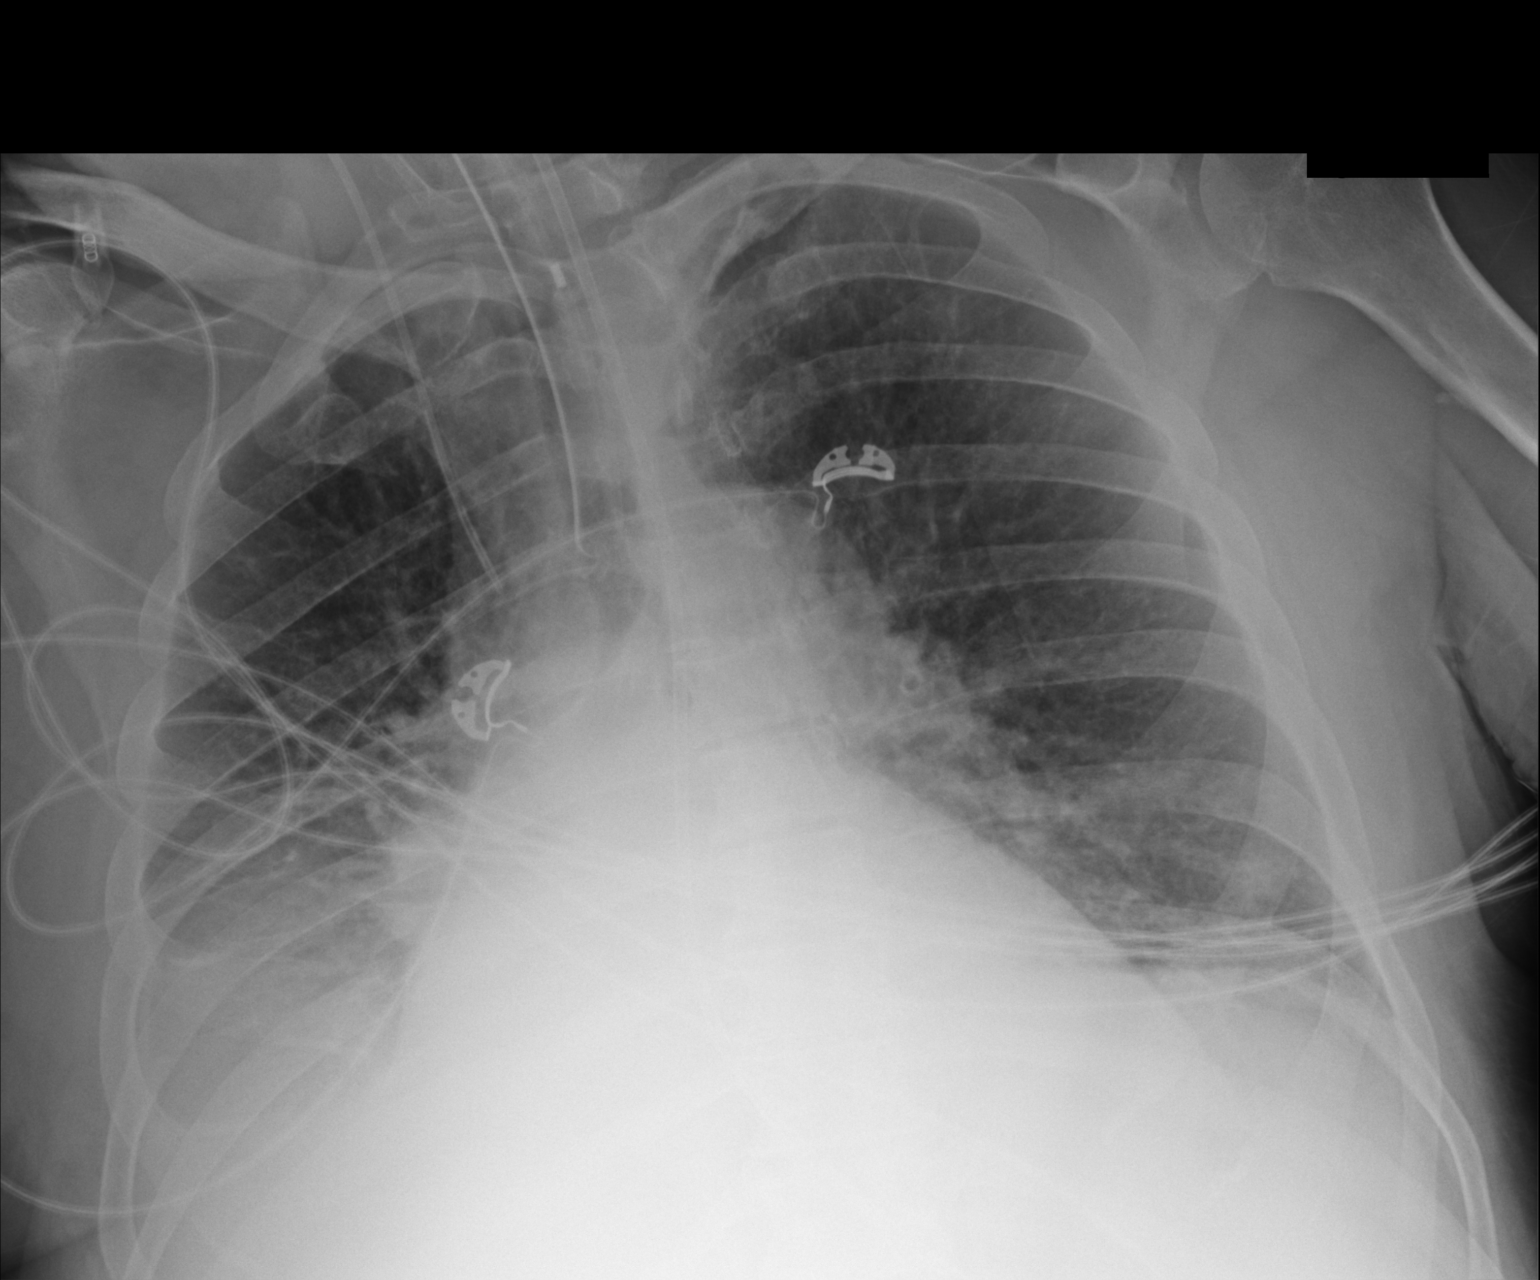

[1 of 1 positions shown; findings below may reference images not displayed]

FINDINGS: Endotracheal tube 2 cm above the carina. Right jugular central
venous catheter tip in the SVC. Feeding tube in place with the tip
not visualized

Bibasilar airspace disease similar to yesterday. Small right
effusion. No pneumothorax
IMPRESSION: Endotracheal tube 2 cm above the carina

Bibasilar airspace disease unchanged from the prior study.

## 2018-01-14 IMAGING — CR DG CHEST 1V PORT
1 series · 1 of 1 positions shown · non-contrast
Comparison: 10/06/2016

CLINICAL DATA: Acute respiratory failure

EXAM:
PORTABLE CHEST 1 VIEW

[AP]
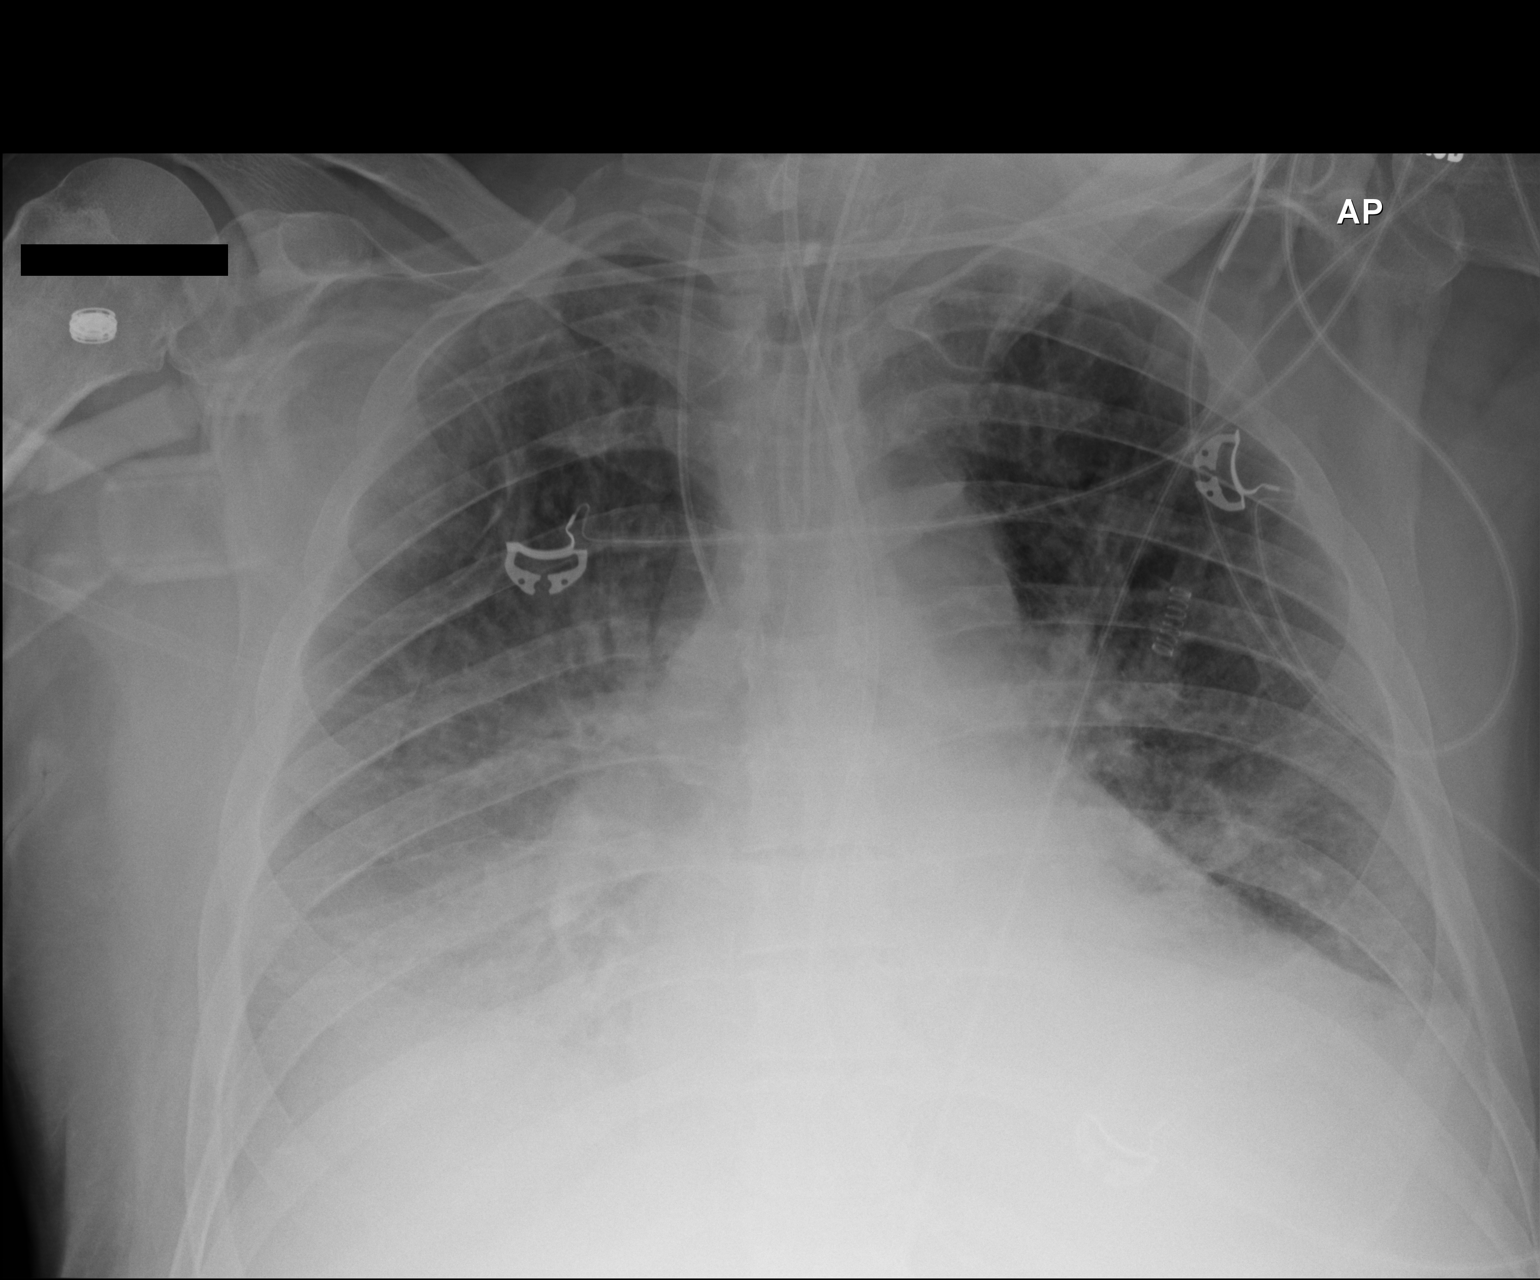

[1 of 1 positions shown; findings below may reference images not displayed]

FINDINGS: Support devices are unchanged. Mild cardiomegaly. Bilateral airspace
opacities and probable layering effusions. Overall aeration may have
decreased slightly since prior study.
IMPRESSION: Bilateral airspace disease and layering effusions, slightly worsened
since prior study.

## 2021-04-25 ENCOUNTER — Encounter (HOSPITAL_COMMUNITY): Payer: Self-pay | Admitting: Cardiology

## 2021-04-25 ENCOUNTER — Other Ambulatory Visit: Payer: Self-pay

## 2021-04-25 ENCOUNTER — Inpatient Hospital Stay (HOSPITAL_COMMUNITY): Payer: Medicare HMO

## 2021-04-25 ENCOUNTER — Ambulatory Visit (HOSPITAL_COMMUNITY): Admit: 2021-04-25 | Payer: Medicare Other | Admitting: Cardiology

## 2021-04-25 ENCOUNTER — Inpatient Hospital Stay (HOSPITAL_COMMUNITY)
Admission: EM | Admit: 2021-04-25 | Discharge: 2021-04-27 | DRG: 246 | Disposition: A | Discharge status: Home Health | Payer: Medicare HMO | Attending: Cardiology | Admitting: Cardiology

## 2021-04-25 ENCOUNTER — Inpatient Hospital Stay (HOSPITAL_COMMUNITY)
Admission: EM | Disposition: A | Discharge status: Home Health | Payer: Self-pay | Source: Home / Self Care | Attending: Cardiology

## 2021-04-25 ENCOUNTER — Other Ambulatory Visit (HOSPITAL_COMMUNITY): Payer: Self-pay

## 2021-04-25 DIAGNOSIS — I110 Hypertensive heart disease with heart failure: Secondary | ICD-10-CM | POA: Diagnosis present

## 2021-04-25 DIAGNOSIS — I5021 Acute systolic (congestive) heart failure: Secondary | ICD-10-CM | POA: Diagnosis present

## 2021-04-25 DIAGNOSIS — F1010 Alcohol abuse, uncomplicated: Secondary | ICD-10-CM | POA: Diagnosis present

## 2021-04-25 DIAGNOSIS — I4901 Ventricular fibrillation: Secondary | ICD-10-CM | POA: Diagnosis not present

## 2021-04-25 DIAGNOSIS — Z8042 Family history of malignant neoplasm of prostate: Secondary | ICD-10-CM

## 2021-04-25 DIAGNOSIS — Z87448 Personal history of other diseases of urinary system: Secondary | ICD-10-CM

## 2021-04-25 DIAGNOSIS — R2681 Unsteadiness on feet: Secondary | ICD-10-CM | POA: Diagnosis present

## 2021-04-25 DIAGNOSIS — E785 Hyperlipidemia, unspecified: Secondary | ICD-10-CM | POA: Diagnosis present

## 2021-04-25 DIAGNOSIS — Z79899 Other long term (current) drug therapy: Secondary | ICD-10-CM

## 2021-04-25 DIAGNOSIS — Z87891 Personal history of nicotine dependence: Secondary | ICD-10-CM

## 2021-04-25 DIAGNOSIS — Z955 Presence of coronary angioplasty implant and graft: Secondary | ICD-10-CM

## 2021-04-25 DIAGNOSIS — Z8371 Family history of colonic polyps: Secondary | ICD-10-CM

## 2021-04-25 DIAGNOSIS — I2111 ST elevation (STEMI) myocardial infarction involving right coronary artery: Secondary | ICD-10-CM | POA: Diagnosis present

## 2021-04-25 DIAGNOSIS — E781 Pure hyperglyceridemia: Secondary | ICD-10-CM | POA: Diagnosis present

## 2021-04-25 DIAGNOSIS — N400 Enlarged prostate without lower urinary tract symptoms: Secondary | ICD-10-CM | POA: Diagnosis present

## 2021-04-25 DIAGNOSIS — Z20822 Contact with and (suspected) exposure to covid-19: Secondary | ICD-10-CM | POA: Diagnosis present

## 2021-04-25 DIAGNOSIS — I255 Ischemic cardiomyopathy: Secondary | ICD-10-CM | POA: Diagnosis present

## 2021-04-25 DIAGNOSIS — Z720 Tobacco use: Secondary | ICD-10-CM

## 2021-04-25 DIAGNOSIS — E119 Type 2 diabetes mellitus without complications: Secondary | ICD-10-CM | POA: Diagnosis present

## 2021-04-25 DIAGNOSIS — M79673 Pain in unspecified foot: Secondary | ICD-10-CM | POA: Diagnosis present

## 2021-04-25 DIAGNOSIS — Z9081 Acquired absence of spleen: Secondary | ICD-10-CM | POA: Diagnosis not present

## 2021-04-25 DIAGNOSIS — I97190 Other postprocedural cardiac functional disturbances following cardiac surgery: Secondary | ICD-10-CM | POA: Diagnosis not present

## 2021-04-25 DIAGNOSIS — Z800 Family history of malignant neoplasm of digestive organs: Secondary | ICD-10-CM | POA: Diagnosis not present

## 2021-04-25 DIAGNOSIS — F32A Depression, unspecified: Secondary | ICD-10-CM | POA: Diagnosis present

## 2021-04-25 DIAGNOSIS — I2510 Atherosclerotic heart disease of native coronary artery without angina pectoris: Secondary | ICD-10-CM | POA: Diagnosis present

## 2021-04-25 DIAGNOSIS — I2119 ST elevation (STEMI) myocardial infarction involving other coronary artery of inferior wall: Secondary | ICD-10-CM | POA: Diagnosis present

## 2021-04-25 DIAGNOSIS — Z87442 Personal history of urinary calculi: Secondary | ICD-10-CM | POA: Diagnosis not present

## 2021-04-25 DIAGNOSIS — Z856 Personal history of leukemia: Secondary | ICD-10-CM | POA: Diagnosis not present

## 2021-04-25 DIAGNOSIS — E782 Mixed hyperlipidemia: Secondary | ICD-10-CM | POA: Diagnosis not present

## 2021-04-25 HISTORY — PX: CORONARY STENT INTERVENTION: CATH118234

## 2021-04-25 HISTORY — PX: LEFT HEART CATH AND CORONARY ANGIOGRAPHY: CATH118249

## 2021-04-25 LAB — CBC WITH DIFFERENTIAL/PLATELET
Abs Immature Granulocytes: 0.06 10*3/uL (ref 0.00–0.07)
Abs Immature Granulocytes: 0.09 10*3/uL — ABNORMAL HIGH (ref 0.00–0.07)
Basophils Absolute: 0.1 10*3/uL (ref 0.0–0.1)
Basophils Absolute: 0.1 10*3/uL (ref 0.0–0.1)
Basophils Relative: 1 %
Basophils Relative: 0 %
Eosinophils Absolute: 0.3 10*3/uL (ref 0.0–0.5)
Eosinophils Absolute: 0 10*3/uL (ref 0.0–0.5)
Eosinophils Relative: 1 %
Eosinophils Relative: 0 %
HCT: 46.2 % (ref 39.0–52.0)
HCT: 49.7 % (ref 39.0–52.0)
Hemoglobin: 15.8 g/dL (ref 13.0–17.0)
Hemoglobin: 17 g/dL (ref 13.0–17.0)
Immature Granulocytes: 0 %
Immature Granulocytes: 0 %
Lymphocytes Relative: 67 %
Lymphocytes Relative: 58 %
Lymphs Abs: 15.7 10*3/uL — ABNORMAL HIGH (ref 0.7–4.0)
Lymphs Abs: 14.5 10*3/uL — ABNORMAL HIGH (ref 0.7–4.0)
MCH: 31 pg (ref 26.0–34.0)
MCH: 31.8 pg (ref 26.0–34.0)
MCHC: 34.2 g/dL (ref 30.0–36.0)
MCHC: 34.2 g/dL (ref 30.0–36.0)
MCV: 90.8 fL (ref 80.0–100.0)
MCV: 93.1 fL (ref 80.0–100.0)
Monocytes Absolute: 1.5 10*3/uL — ABNORMAL HIGH (ref 0.1–1.0)
Monocytes Absolute: 1 10*3/uL (ref 0.1–1.0)
Monocytes Relative: 7 %
Monocytes Relative: 4 %
Neutro Abs: 5.5 10*3/uL (ref 1.7–7.7)
Neutro Abs: 9.5 10*3/uL — ABNORMAL HIGH (ref 1.7–7.7)
Neutrophils Relative %: 24 %
Neutrophils Relative %: 38 %
Platelets: 339 10*3/uL (ref 150–400)
Platelets: 354 10*3/uL (ref 150–400)
RBC: 5.09 MIL/uL (ref 4.22–5.81)
RBC: 5.34 MIL/uL (ref 4.22–5.81)
RDW: 12.9 % (ref 11.5–15.5)
RDW: 13.2 % (ref 11.5–15.5)
WBC: 23.2 10*3/uL — ABNORMAL HIGH (ref 4.0–10.5)
WBC: 25.1 10*3/uL — ABNORMAL HIGH (ref 4.0–10.5)
nRBC: 0 % (ref 0.0–0.2)
nRBC: 0 % (ref 0.0–0.2)

## 2021-04-25 LAB — POCT I-STAT, CHEM 8
BUN: 12 mg/dL (ref 8–23)
Calcium, Ion: 1.21 mmol/L (ref 1.15–1.40)
Chloride: 103 mmol/L (ref 98–111)
Creatinine, Ser: 0.9 mg/dL (ref 0.61–1.24)
Glucose, Bld: 341 mg/dL — ABNORMAL HIGH (ref 70–99)
HCT: 47 % (ref 39.0–52.0)
Hemoglobin: 16 g/dL (ref 13.0–17.0)
Potassium: 3.4 mmol/L — ABNORMAL LOW (ref 3.5–5.1)
Sodium: 136 mmol/L (ref 135–145)
TCO2: 17 mmol/L — ABNORMAL LOW (ref 22–32)

## 2021-04-25 LAB — LIPID PANEL
Cholesterol: 186 mg/dL (ref 0–200)
HDL: 33 mg/dL — ABNORMAL LOW (ref >40)
LDL Cholesterol: UNDETERMINED mg/dL (ref 0–99)
Total CHOL/HDL Ratio: 5.6 RATIO
Triglycerides: 406 mg/dL — ABNORMAL HIGH (ref <150)
VLDL: UNDETERMINED mg/dL (ref 0–40)

## 2021-04-25 LAB — COMPREHENSIVE METABOLIC PANEL
ALT: 23 U/L (ref 0–44)
ALT: 43 U/L (ref 0–44)
AST: 34 U/L (ref 15–41)
AST: 156 U/L — ABNORMAL HIGH (ref 15–41)
Albumin: 3.4 g/dL — ABNORMAL LOW (ref 3.5–5.0)
Albumin: 4 g/dL (ref 3.5–5.0)
Alkaline Phosphatase: 58 U/L (ref 38–126)
Alkaline Phosphatase: 62 U/L (ref 38–126)
Anion gap: 13 (ref 5–15)
Anion gap: 12 (ref 5–15)
BUN: 13 mg/dL (ref 8–23)
BUN: 9 mg/dL (ref 8–23)
CO2: 16 mmol/L — ABNORMAL LOW (ref 22–32)
CO2: 22 mmol/L (ref 22–32)
Calcium: 9.1 mg/dL (ref 8.9–10.3)
Calcium: 9.8 mg/dL (ref 8.9–10.3)
Chloride: 103 mmol/L (ref 98–111)
Chloride: 102 mmol/L (ref 98–111)
Creatinine, Ser: 1.14 mg/dL (ref 0.61–1.24)
Creatinine, Ser: 1 mg/dL (ref 0.61–1.24)
GFR, Estimated: >60 mL/min (ref >60)
GFR, Estimated: >60 mL/min (ref >60)
Glucose, Bld: 332 mg/dL — ABNORMAL HIGH (ref 70–99)
Glucose, Bld: 206 mg/dL — ABNORMAL HIGH (ref 70–99)
Potassium: 3.9 mmol/L (ref 3.5–5.1)
Potassium: 4.4 mmol/L (ref 3.5–5.1)
Sodium: 132 mmol/L — ABNORMAL LOW (ref 135–145)
Sodium: 136 mmol/L (ref 135–145)
Total Bilirubin: 0.6 mg/dL (ref 0.3–1.2)
Total Bilirubin: 0.6 mg/dL (ref 0.3–1.2)
Total Protein: 6.1 g/dL — ABNORMAL LOW (ref 6.5–8.1)
Total Protein: 6.7 g/dL (ref 6.5–8.1)

## 2021-04-25 LAB — ECHOCARDIOGRAM COMPLETE
AR max vel: 2.64 cm2
AV Area VTI: 1.68 cm2
AV Area mean vel: 2.47 cm2
AV Mean grad: 2 mmHg
AV Peak grad: 3.6 mmHg
Ao pk vel: 0.95 m/s
Area-P 1/2: 5.04 cm2
Calc EF: 15 %
S' Lateral: 4.4 cm
Single Plane A2C EF: 21.6 %
Single Plane A4C EF: 3.3 %
Weight: 3360 oz

## 2021-04-25 LAB — HEMOGLOBIN A1C
Hgb A1c MFr Bld: 7.9 % — ABNORMAL HIGH (ref 4.8–5.6)
Hgb A1c MFr Bld: 7.9 % — ABNORMAL HIGH (ref 4.8–5.6)
Mean Plasma Glucose: 180.03 mg/dL
Mean Plasma Glucose: 180.03 mg/dL

## 2021-04-25 LAB — RESP PANEL BY RT-PCR (FLU A&B, COVID) ARPGX2
Influenza A by PCR: NEGATIVE
Influenza B by PCR: NEGATIVE
SARS Coronavirus 2 by RT PCR: NEGATIVE

## 2021-04-25 LAB — PROTIME-INR
INR: 1 (ref 0.8–1.2)
Prothrombin Time: 13 seconds (ref 11.4–15.2)

## 2021-04-25 LAB — MAGNESIUM: Magnesium: 2 mg/dL (ref 1.7–2.4)

## 2021-04-25 LAB — POCT ACTIVATED CLOTTING TIME: Activated Clotting Time: 671 seconds

## 2021-04-25 LAB — TROPONIN I (HIGH SENSITIVITY)
Troponin I (High Sensitivity): 76 ng/L — ABNORMAL HIGH (ref <18)
Troponin I (High Sensitivity): 17150 ng/L (ref <18)

## 2021-04-25 LAB — GLUCOSE, CAPILLARY
Glucose-Capillary: 155 mg/dL — ABNORMAL HIGH (ref 70–99)
Glucose-Capillary: 138 mg/dL — ABNORMAL HIGH (ref 70–99)

## 2021-04-25 LAB — MRSA NEXT GEN BY PCR, NASAL: MRSA by PCR Next Gen: NOT DETECTED

## 2021-04-25 LAB — APTT: aPTT: 34 seconds (ref 24–36)

## 2021-04-25 LAB — LDL CHOLESTEROL, DIRECT: Direct LDL: 127.1 mg/dL — ABNORMAL HIGH (ref 0–99)

## 2021-04-25 LAB — TSH: TSH: 2.122 u[IU]/mL (ref 0.350–4.500)

## 2021-04-25 SURGERY — LEFT HEART CATH AND CORONARY ANGIOGRAPHY
Anesthesia: LOCAL

## 2021-04-25 MED ORDER — ASPIRIN 81 MG PO CHEW: 81.0000 mg | CHEWABLE_TABLET | Freq: Every day | ORAL | Status: DC

## 2021-04-25 MED ORDER — HEPARIN (PORCINE) IN NACL 1000-0.9 UT/500ML-% IV SOLN
INTRAVENOUS | Status: AC
  Filled 2021-04-25: qty 1000

## 2021-04-25 MED ORDER — TICAGRELOR 90 MG PO TABS
ORAL_TABLET | ORAL | Status: DC | PRN
  Administered 2021-04-25: 180 mg via ORAL

## 2021-04-25 MED ORDER — CHLORHEXIDINE GLUCONATE CLOTH 2 % EX PADS
6.0000 | MEDICATED_PAD | Freq: Every day | CUTANEOUS | Status: DC
  Administered 2021-04-26 – 2021-04-27 (×2): 6 via TOPICAL

## 2021-04-25 MED ORDER — LIDOCAINE HCL (PF) 1 % IJ SOLN
INTRAMUSCULAR | Status: AC
  Filled 2021-04-25: qty 30

## 2021-04-25 MED ORDER — AMIODARONE HCL 150 MG/3ML IV SOLN
INTRAVENOUS | Status: AC
  Filled 2021-04-25: qty 6

## 2021-04-25 MED ORDER — VERAPAMIL HCL 2.5 MG/ML IV SOLN
INTRAVENOUS | Status: AC
  Filled 2021-04-25: qty 2

## 2021-04-25 MED ORDER — LIVING WELL WITH DIABETES BOOK
Freq: Once | Status: AC
  Filled 2021-04-25: qty 1

## 2021-04-25 MED ORDER — LIDOCAINE HCL (PF) 1 % IJ SOLN
INTRAMUSCULAR | Status: DC | PRN
  Administered 2021-04-25: 3 mL via INTRADERMAL

## 2021-04-25 MED ORDER — OXYMETAZOLINE HCL 0.05 % NA SOLN
1.0000 | Freq: Two times a day (BID) | NASAL | Status: DC | PRN
  Administered 2021-04-25 – 2021-04-26 (×3): 1 via NASAL
  Filled 2021-04-25: qty 30

## 2021-04-25 MED ORDER — ONDANSETRON HCL 4 MG/2ML IJ SOLN: 4.0000 mg | Freq: Four times a day (QID) | INTRAMUSCULAR | Status: DC | PRN

## 2021-04-25 MED ORDER — ACETAMINOPHEN 325 MG PO TABS: 650.0000 mg | ORAL_TABLET | ORAL | Status: DC | PRN

## 2021-04-25 MED ORDER — PANTOPRAZOLE SODIUM 40 MG PO TBEC
40.0000 mg | DELAYED_RELEASE_TABLET | Freq: Every day | ORAL | Status: DC
  Administered 2021-04-25 – 2021-04-27 (×3): 40 mg via ORAL
  Filled 2021-04-25 (×3): qty 1

## 2021-04-25 MED ORDER — AMIODARONE HCL IN DEXTROSE 360-4.14 MG/200ML-% IV SOLN
INTRAVENOUS | Status: AC | PRN
  Administered 2021-04-25: 60 mg/h via INTRAVENOUS

## 2021-04-25 MED ORDER — HEPARIN (PORCINE) IN NACL 1000-0.9 UT/500ML-% IV SOLN
INTRAVENOUS | Status: DC | PRN
  Administered 2021-04-25 (×2): 500 mL

## 2021-04-25 MED ORDER — HEPARIN SODIUM (PORCINE) 1000 UNIT/ML IJ SOLN
INTRAMUSCULAR | Status: AC
  Filled 2021-04-25: qty 1

## 2021-04-25 MED ORDER — HEPARIN SODIUM (PORCINE) 1000 UNIT/ML IJ SOLN
INTRAMUSCULAR | Status: DC | PRN
  Administered 2021-04-25: 10000 [IU] via INTRAVENOUS
  Administered 2021-04-25: 9 [IU] via INTRAVENOUS

## 2021-04-25 MED ORDER — NITROGLYCERIN 0.4 MG SL SUBL: 0.4000 mg | SUBLINGUAL_TABLET | SUBLINGUAL | Status: DC | PRN

## 2021-04-25 MED ORDER — ASPIRIN EC 81 MG PO TBEC
81.0000 mg | DELAYED_RELEASE_TABLET | Freq: Every day | ORAL | Status: DC
  Administered 2021-04-26 – 2021-04-27 (×2): 81 mg via ORAL
  Filled 2021-04-25 (×3): qty 1

## 2021-04-25 MED ORDER — SODIUM CHLORIDE 0.9% FLUSH: 3.0000 mL | INTRAVENOUS | Status: DC | PRN

## 2021-04-25 MED ORDER — SODIUM CHLORIDE 0.9 % IV SOLN
INTRAVENOUS | Status: AC | PRN
Start: 2021-04-25 — End: 2021-04-25
  Administered 2021-04-25: 100 mL/h via INTRAVENOUS

## 2021-04-25 MED ORDER — AMIODARONE HCL IN DEXTROSE 360-4.14 MG/200ML-% IV SOLN
60.0000 mg/h | INTRAVENOUS | Status: AC
  Administered 2021-04-25: 60 mg/h via INTRAVENOUS

## 2021-04-25 MED ORDER — INSULIN ASPART 100 UNIT/ML IJ SOLN
0.0000 [IU] | Freq: Three times a day (TID) | INTRAMUSCULAR | Status: DC
  Administered 2021-04-25 – 2021-04-26 (×3): 3 [IU] via SUBCUTANEOUS
  Administered 2021-04-26 – 2021-04-27 (×3): 2 [IU] via SUBCUTANEOUS

## 2021-04-25 MED ORDER — SODIUM CHLORIDE 0.9% FLUSH
3.0000 mL | Freq: Two times a day (BID) | INTRAVENOUS | Status: DC
  Administered 2021-04-25 – 2021-04-26 (×3): 3 mL via INTRAVENOUS

## 2021-04-25 MED ORDER — OXYMETAZOLINE HCL 0.05 % NA SOLN: 1.0000 | Freq: Two times a day (BID) | NASAL | Status: DC

## 2021-04-25 MED ORDER — METOPROLOL TARTRATE 12.5 MG HALF TABLET
12.5000 mg | ORAL_TABLET | Freq: Two times a day (BID) | ORAL | Status: DC
  Administered 2021-04-25 (×2): 12.5 mg via ORAL
  Filled 2021-04-25 (×2): qty 1

## 2021-04-25 MED ORDER — ENOXAPARIN SODIUM 40 MG/0.4ML IJ SOSY
40.0000 mg | PREFILLED_SYRINGE | INTRAMUSCULAR | Status: DC
  Administered 2021-04-26 – 2021-04-27 (×2): 40 mg via SUBCUTANEOUS
  Filled 2021-04-25 (×3): qty 0.4

## 2021-04-25 MED ORDER — AMIODARONE HCL 150 MG/3ML IV SOLN
INTRAVENOUS | Status: DC | PRN
  Administered 2021-04-25: 300 mg via INTRAVENOUS

## 2021-04-25 MED ORDER — TICAGRELOR 90 MG PO TABS
90.0000 mg | ORAL_TABLET | Freq: Two times a day (BID) | ORAL | Status: DC
  Administered 2021-04-25 – 2021-04-27 (×5): 90 mg via ORAL
  Filled 2021-04-25 (×5): qty 1

## 2021-04-25 MED ORDER — FENTANYL CITRATE (PF) 100 MCG/2ML IJ SOLN
INTRAMUSCULAR | Status: AC
  Filled 2021-04-25: qty 2

## 2021-04-25 MED ORDER — AMIODARONE HCL IN DEXTROSE 360-4.14 MG/200ML-% IV SOLN
INTRAVENOUS | Status: AC
  Filled 2021-04-25: qty 200

## 2021-04-25 MED ORDER — IOHEXOL 350 MG/ML SOLN
INTRAVENOUS | Status: DC | PRN
  Administered 2021-04-25: 100 mL

## 2021-04-25 MED ORDER — SODIUM CHLORIDE 0.9 % WEIGHT BASED INFUSION
1.0000 mL/kg/h | INTRAVENOUS | Status: AC
  Administered 2021-04-25: 1 mL/kg/h via INTRAVENOUS

## 2021-04-25 MED ORDER — ATORVASTATIN CALCIUM 80 MG PO TABS
80.0000 mg | ORAL_TABLET | Freq: Every day | ORAL | Status: DC
  Administered 2021-04-25 – 2021-04-27 (×3): 80 mg via ORAL
  Filled 2021-04-25 (×3): qty 1

## 2021-04-25 MED ORDER — FENTANYL CITRATE (PF) 100 MCG/2ML IJ SOLN
INTRAMUSCULAR | Status: DC | PRN
  Administered 2021-04-25 (×2): 25 ug via INTRAVENOUS

## 2021-04-25 MED ORDER — SODIUM CHLORIDE 0.9 % IV SOLN: 250.0000 mL | INTRAVENOUS | Status: DC | PRN

## 2021-04-25 MED ORDER — AMIODARONE HCL IN DEXTROSE 360-4.14 MG/200ML-% IV SOLN
30.0000 mg/h | INTRAVENOUS | Status: DC
  Administered 2021-04-25 – 2021-04-26 (×2): 30 mg/h via INTRAVENOUS
  Filled 2021-04-25 (×2): qty 200

## 2021-04-25 MED ORDER — VERAPAMIL HCL 2.5 MG/ML IV SOLN
INTRAVENOUS | Status: DC | PRN
  Administered 2021-04-25: 3 mL via INTRA_ARTERIAL

## 2021-04-25 MED ORDER — ENOXAPARIN SODIUM 40 MG/0.4ML IJ SOSY: 40.0000 mg | PREFILLED_SYRINGE | INTRAMUSCULAR | Status: DC

## 2021-04-25 MED ORDER — TICAGRELOR 90 MG PO TABS
ORAL_TABLET | ORAL | Status: AC
  Filled 2021-04-25: qty 2

## 2021-04-25 MED ORDER — GABAPENTIN 300 MG PO CAPS
300.0000 mg | ORAL_CAPSULE | Freq: Every day | ORAL | Status: DC
  Administered 2021-04-25: 300 mg via ORAL
  Filled 2021-04-25: qty 1

## 2021-04-25 SURGICAL SUPPLY — 17 items
BALLN SAPPHIRE 2.0X12 (BALLOONS) ×2
BALLN SAPPHIRE ~~LOC~~ 4.0X12 (BALLOONS) ×2 IMPLANT
BALLOON SAPPHIRE 2.0X12 (BALLOONS) ×1 IMPLANT
CATH 5FR JL3.5 JR4 ANG PIG MP (CATHETERS) ×2 IMPLANT
CATH VISTA GUIDE 6FR JR4 (CATHETERS) ×2 IMPLANT
DEVICE RAD COMP TR BAND LRG (VASCULAR PRODUCTS) ×2 IMPLANT
ELECT DEFIB PAD ADLT CADENCE (PAD) ×2 IMPLANT
GLIDESHEATH SLEND SS 6F .021 (SHEATH) ×2 IMPLANT
GUIDEWIRE INQWIRE 1.5J.035X260 (WIRE) ×1 IMPLANT
INQWIRE 1.5J .035X260CM (WIRE) ×2
KIT ENCORE 26 ADVANTAGE (KITS) ×2 IMPLANT
KIT HEART LEFT (KITS) ×2 IMPLANT
PACK CARDIAC CATHETERIZATION (CUSTOM PROCEDURE TRAY) ×2 IMPLANT
STENT ONYX FRONTIER 3.5X15 (Permanent Stent) ×2 IMPLANT
TRANSDUCER W/STOPCOCK (MISCELLANEOUS) ×2 IMPLANT
TUBING CIL FLEX 10 FLL-RA (TUBING) ×2 IMPLANT
WIRE ASAHI PROWATER 180CM (WIRE) ×2 IMPLANT

## 2021-04-25 NOTE — TOC Benefit Eligibility Note (Signed)
Patient Teacher, English as a foreign language completed.    The patient is currently admitted and upon discharge could be taking Brilinta 90 mg.  The current 30 day co-pay is, $47.00.   The patient is currently admitted and upon discharge could be taking Jardiance 10 mg.  The current 30 day co-pay is, $47.00.   The patient is currently admitted and upon discharge could be taking Farxiga 10 mg.  The current 30 day co-pay is, $100.00.   The patient is currently admitted and upon discharge could be taking Entresto 24-26 mg.  The current 30 day co-pay is, $47.00.   The patient is currently admitted and upon discharge could be taking Vascepa 1 g.  The current 30 day co-pay is, $47.00.   The patient is insured through [place], [place] Patient Advocate Specialist [place] Team Direct Number: [phone]  Fax: [phone]

## 2021-04-25 NOTE — Progress Notes (Signed)
*  PRELIMINARY RESULTS* Echocardiogram 2D Echocardiogram has been performed.  Luisa Hart RDCS 04/25/2021, 1:39 PM

## 2021-04-25 NOTE — Progress Notes (Signed)
Inpatient Diabetes Program Recommendations  AACE/ADA: New Consensus Statement on Inpatient Glycemic Control (2015)  Target Ranges:  Prepandial:   less than 140 mg/dL      Peak postprandial:   less than 180 mg/dL (1-2 hours)      Critically ill patients:  140 - 180 mg/dL   Lab Results  Component Value Date   GLUCAP 89 11/05/2016   HGBA1C 7.9 (H) 04/25/2021    Review of Glycemic Control  Diabetes history: DM2 Outpatient Diabetes medications: None Current orders for Inpatient glycemic control: Novolog 0-15 units TID  Received referral for A1C of 7.9% in the setting of STEMI.  Spoke with patient at bedside.  S/P cardiac cath.  He states he does not take any medications for his diabetes.  He has been prescribed insulin in the past as well as Jardiance, Metformin and Glipizide.  He stopped taking the Metformin "some time ago" because it upset his stomach.  Jardiance was > $100/month and he could not afford that.  He states he wants to manage his diabetes with diet.  Reviewed patient's current A1c of 7.9% (average blood sugar of 180 mg/dL). Explained what a A1c is and what it measures. Also reviewed goal A1c with patient, importance of good glucose control @ home, and blood sugar goals.  Explained since he has diabetes and now cardiac issues it is extremely important to work on getting his A1C < 6.5%.  He drinks alcohol and states "he falls off the wagon sometimes".  He admits to drinking regular soda and states "diet soda is just as bad for you".  Explained diet soda does not increase blood glucose.    Please order a glucometer at [facility]Order # [id number]  Will continue to follow while inpatient.  Thank you, Reche Dixon, RN, BSN Diabetes Coordinator Inpatient Diabetes Program [phone] (team pager from 8a-5p)

## 2021-04-25 NOTE — H&P (Signed)
Cardiology Admission History and Physical:   Patient ID: Barry Mccoy MRN: [id number]; DOB: Jan 09, 1951   Admission date: 04/25/2021  PCP:  [name], PA-C   [place] Providers Cardiologist:  New to [facility]   Chief Complaint: Chest pain/STEMI  Patient Profile:   Barry Mccoy is a 70 y.o. male with history of hypertension, hyperlipidemia, diabetes mellitus, pancreatitis with pseudocyst, splenic abscess s/p splenectomy, remote tobacco smoking/alcohol abuse and CLL who is being seen 04/25/2021 for the evaluation of inferior STEMI.  History of significant GI bleed in 2018 requiring multiple units of transfusion.  Had episode of SVT at that time.  History of Present Illness:   [name] presented by EMS with code STEMI.  Reported developing chest pain 1 hour prior to arrival.  Describes chest pain as pressure sensation.  Associated with diaphoresis and shortness of breath.  No similar episode.  EMS was called and found to have inferior STEMI and activation of code.  He received aspirin 325 mg.  Upon arrival to ER still having chest discomfort and diaphoresis.  He is taken to cardiac cath for immediate catheterization.  Denies prior similar episode.   Past Medical History:  Diagnosis Date   Anxiety    BPH (benign prostatic hyperplasia)    Cervical spine disease    Chronic kidney disease    kidney stones   Depression    Disorder of lumbar spine    ETOH abuse    GI bleed    Muscle spasm     Past Surgical History:  Procedure Laterality Date   COLONOSCOPY WITH PROPOFOL N/A 11/08/2016   Procedure: COLONOSCOPY WITH PROPOFOL;  Surgeon: Wilford Corner, MD;  Location: [facility];  Service: Endoscopy;  Laterality: N/A;   ELBOW SURGERY     as child   ELBOW SURGERY     ENTEROSCOPY N/A 11/19/2016   Procedure: ENTEROSCOPY;  Surgeon: Wonda Horner, MD;  Location: [facility];  Service: Endoscopy;  Laterality: N/A;   ESOPHAGOGASTRODUODENOSCOPY N/A 10/04/2016   Procedure:  ESOPHAGOGASTRODUODENOSCOPY (EGD);  Surgeon: Carol Ada, MD;  Location: [place];  Service: Gastroenterology;  Laterality: N/A;   GIVENS CAPSULE STUDY N/A 11/09/2016   Procedure: GIVENS CAPSULE STUDY;  Surgeon: Wilford Corner, MD;  Location: [facility];  Service: Endoscopy;  Laterality: N/A;     Medications Prior to Admission: Prior to Admission medications   Medication Sig Start Date End Date Taking? Authorizing Provider  acetaminophen (TYLENOL) 325 MG tablet Take 2 tablets (650 mg total) by mouth every 6 (six) hours as needed for mild pain, moderate pain or headache (or Fever >/= 101). 10/27/16   Johnson, Clanford L, MD  baclofen (LIORESAL) 20 MG tablet TAKE 1 TABLET BY MOUTH EVERY 12 HOURS AS NEEDED FOR SPASMS 10/30/16   [name], PA-C  Cholecalciferol (VITAMIN D-3) 5000 units TABS Take 5,000 Units by mouth daily.     [provider]  folic acid (FOLVITE) 1 MG tablet Take 1 tablet (1 mg total) by mouth daily. Patient taking differently: Take 1 mg by mouth every morning.  10/28/16   Johnson, Clanford L, MD  iron polysaccharides (NIFEREX) 150 MG capsule Take 1 capsule (150 mg total) by mouth 2 (two) times daily. 11/21/16   Elgergawy, Silver Huguenin, MD  lidocaine (LIDODERM) 5 % Place 1 patch onto the skin every 12 (twelve) hours as needed (pain).  09/12/16   [provider]  metoprolol tartrate (LOPRESSOR) 25 MG tablet Take 0.5 tablets (12.5 mg total) by mouth 2 (two) times  daily. 11/21/16   Elgergawy, Silver Huguenin, MD  metoprolol tartrate (LOPRESSOR) 25 MG tablet TAKE 1 TABLET (25 MG TOTAL) BY MOUTH 2 (TWO) TIMES DAILY. 11/26/16   [name], PA-C  Multiple Vitamin (MULTIVITAMIN WITH MINERALS) TABS tablet Take 1 tablet by mouth at bedtime.    [provider]  OnabotulinumtoxinA (BOTOX IJ) Inject as directed See admin instructions. Botox injections done at Dr. Audery Amel office approximately every 90 days    [provider]  pantoprazole (PROTONIX) 40 MG tablet Take  1 tablet (40 mg total) by mouth 2 (two) times daily. 11/21/16   Elgergawy, Silver Huguenin, MD  polyethylene glycol (MIRALAX / GLYCOLAX) packet Take 17 g by mouth daily as needed for mild constipation. 10/27/16   Johnson, Clanford L, MD  QUEtiapine (SEROQUEL) 50 MG tablet TAKE 1 TABLET (50 MG TOTAL) BY MOUTH AT BEDTIME. 11/26/16   Dena Billet B, PA-C  sertraline (ZOLOFT) 50 MG tablet TAKE 1 TABLET BY MOUTH AT BEDTIME 11/26/16   Dixon, Stanton Kidney B, PA-C  tamsulosin (FLOMAX) 0.4 MG CAPS capsule TAKE ONE CAPSULE BY MOUTH AT BEDTIME 11/26/16   [name], PA-C  thiamine 100 MG tablet Take 1 tablet (100 mg total) by mouth daily. For 4days then down to '100mg'$  daily Patient taking differently: Take 250 mg by mouth daily.  10/27/16   Johnson, Clanford L, MD  traZODone (DESYREL) 100 MG tablet Take 1 tablet (100 mg total) by mouth at bedtime. 10/27/16   Johnson, Clanford L, MD  vitamin B-12 1000 MCG tablet Take 1 tablet (1,000 mcg total) by mouth daily. 11/22/16   Elgergawy, Silver Huguenin, MD     Allergies:    Allergies  Allergen Reactions   Shellfish Allergy Other (See Comments)    Unknown     Social History:   Social History   Socioeconomic History   Marital status: Widowed    Spouse name: Not on file   Number of children: Not on file   Years of education: Not on file   Highest education level: Not on file  Occupational History   Not on file  Tobacco Use   Smoking status: Former    Packs/day: 1.00    Years: 50.00    Pack years: 50.00    Types: Cigarettes    Quit date: 10/23/2016    Years since quitting: 4.5   Smokeless tobacco: Never   Tobacco comments:    attempting to quit- down to 8 a day   Substance and Sexual Activity   Alcohol use: No    Comment: 2 drinks a month   Drug use: No   Sexual activity: Yes  Other Topics Concern   Not on file  Social History Narrative   ** Merged History Encounter **       Social Determinants of Health   Financial Resource Strain: Not on file  Food Insecurity:  Not on file  Transportation Needs: Not on file  Physical Activity: Not on file  Stress: Not on file  Social Connections: Not on file  Intimate Partner Violence: Not on file    Family History:   The patient's family history includes Alcohol abuse in his brother; Cancer in his brother; Cancer (age of onset: 77) in his mother; Colon cancer in his brother and mother; Colon polyps in his brother; Early death in his brother; Prostate cancer in his brother.    ROS:  Please see the history of present illness.  All other ROS reviewed and negative.  Physical Exam/Data:  There were no vitals filed for this visit. No intake or output data in the 24 hours ending 04/25/21 0950 Last 3 Weights 11/21/2016 11/20/2016 11/19/2016  Weight (lbs) 215 lb 9.8 oz 214 lb 4.6 oz 218 lb  Weight (kg) 97.8 kg 97.2 kg 98.884 kg     There is no height or weight on file to calculate BMI.  General:  Well nourished, well developed, in no acute distress HEENT: normal Lymph: no adenopathy Neck: no JVD Endocrine:  No thryomegaly Vascular: No carotid bruits; FA pulses 2+ bilaterally without bruits  Cardiac:  normal S1, S2; RRR; no murmur  Lungs:  clear to auscultation bilaterally, no wheezing, rhonchi or rales  Abd: soft, nontender, no hepatomegaly  Ext: no edema Musculoskeletal:  No deformities, BUE and BLE strength normal and equal Skin: warm and dry  Neuro:  CNs 2-12 intact, no focal abnormalities noted Psych:  Normal affect    EKG:  The ECG that was done today  was personally reviewed and demonstrates junctional rhythm, inferior ST elevation  Relevant CV Studies:  Echocardiogram February 2018 -LVEF of 55 to 60%.  Poor study  Laboratory Data:  Pending lab   Radiology/Studies:  No results found.   Assessment and Plan:   Inferior STEMI -Chest pressure with dyspnea and diaphoresis 1 hour prior to arrival. -Pending emergent cardiac catheterization -Check echocardiogram, lipid panel and hemoglobin  A1c  2.  Hypertension 3.  Diabetes mellitus 4.  Hyperlipidemia 5.  Prior alcohol and tobacco abuse   Risk Assessment/Risk Scores:   TIMI Risk Score for ST  Elevation MI:   The patient's TIMI risk score is 3, which indicates a 4.4% risk of all cause mortality at 30 days. {    Severity of Illness: The appropriate patient status for this patient is INPATIENT. Inpatient status is judged to be reasonable and necessary in order to provide the required intensity of service to ensure the patient's safety. The patient's presenting symptoms, physical exam findings, and initial radiographic and laboratory data in the context of their chronic comorbidities is felt to place them at high risk for further clinical deterioration. Furthermore, it is not anticipated that the patient will be medically stable for discharge from the hospital within 2 midnights of admission. The following factors support the patient status of inpatient.   " The patient's presenting symptoms include Chest pain, SOB and diaphoresis. " The worrisome physical exam findings include - N/A " The initial radiographic and laboratory data are worrisome because of Pending  " The chronic co-morbidities include hypertension, diabetes, prior GI bleed, history of tobacco Arabic abuse   * I certify that at the point of admission it is my clinical judgment that the patient will require inpatient hospital care spanning beyond 2 midnights from the point of admission due to high intensity of service, high risk for further deterioration and high frequency of surveillance required.*   For questions or updates, please contact [place] Please consult [email or web address] for contact info under     Jarrett Soho, PA  04/25/2021 9:50 AM

## 2021-04-25 NOTE — Progress Notes (Signed)
CRITICAL VALUE STICKER  CRITICAL VALUE: Troponin  RECEIVER (on-site recipient of call):  Freda Jackson RN  DATE & TIME NOTIFIED: 04/25/21 @ 1434  MESSENGER (representative from lab):  MD NOTIFIED: Peter Martinique , MD   TIME OF NOTIFICATION: [id number]  RESPONSE: awaiting new orders

## 2021-04-26 ENCOUNTER — Other Ambulatory Visit (HOSPITAL_COMMUNITY): Payer: Self-pay

## 2021-04-26 ENCOUNTER — Encounter (HOSPITAL_COMMUNITY): Payer: Self-pay | Admitting: Cardiology

## 2021-04-26 DIAGNOSIS — Z720 Tobacco use: Secondary | ICD-10-CM | POA: Diagnosis not present

## 2021-04-26 DIAGNOSIS — I2111 ST elevation (STEMI) myocardial infarction involving right coronary artery: Secondary | ICD-10-CM | POA: Diagnosis not present

## 2021-04-26 DIAGNOSIS — E782 Mixed hyperlipidemia: Secondary | ICD-10-CM | POA: Diagnosis not present

## 2021-04-26 DIAGNOSIS — E119 Type 2 diabetes mellitus without complications: Secondary | ICD-10-CM | POA: Diagnosis not present

## 2021-04-26 LAB — LIPID PANEL
Cholesterol: 163 mg/dL (ref 0–200)
HDL: 31 mg/dL — ABNORMAL LOW (ref >40)
LDL Cholesterol: 85 mg/dL (ref 0–99)
Total CHOL/HDL Ratio: 5.3 RATIO
Triglycerides: 234 mg/dL — ABNORMAL HIGH (ref <150)
VLDL: 47 mg/dL — ABNORMAL HIGH (ref 0–40)

## 2021-04-26 LAB — CBC
HCT: 42.9 % (ref 39.0–52.0)
Hemoglobin: 14.4 g/dL (ref 13.0–17.0)
MCH: 31.2 pg (ref 26.0–34.0)
MCHC: 33.6 g/dL (ref 30.0–36.0)
MCV: 93.1 fL (ref 80.0–100.0)
Platelets: 306 10*3/uL (ref 150–400)
RBC: 4.61 MIL/uL (ref 4.22–5.81)
RDW: 13.3 % (ref 11.5–15.5)
WBC: 26.4 10*3/uL — ABNORMAL HIGH (ref 4.0–10.5)
nRBC: 0 % (ref 0.0–0.2)

## 2021-04-26 LAB — BASIC METABOLIC PANEL
Anion gap: 6 (ref 5–15)
BUN: 10 mg/dL (ref 8–23)
CO2: 25 mmol/L (ref 22–32)
Calcium: 9.2 mg/dL (ref 8.9–10.3)
Chloride: 104 mmol/L (ref 98–111)
Creatinine, Ser: 0.91 mg/dL (ref 0.61–1.24)
GFR, Estimated: >60 mL/min (ref >60)
Glucose, Bld: 157 mg/dL — ABNORMAL HIGH (ref 70–99)
Potassium: 4.8 mmol/L (ref 3.5–5.1)
Sodium: 135 mmol/L (ref 135–145)

## 2021-04-26 LAB — GLUCOSE, CAPILLARY
Glucose-Capillary: 163 mg/dL — ABNORMAL HIGH (ref 70–99)
Glucose-Capillary: 151 mg/dL — ABNORMAL HIGH (ref 70–99)
Glucose-Capillary: 121 mg/dL — ABNORMAL HIGH (ref 70–99)
Glucose-Capillary: 148 mg/dL — ABNORMAL HIGH (ref 70–99)

## 2021-04-26 MED ORDER — METOPROLOL SUCCINATE ER 25 MG PO TB24
25.0000 mg | ORAL_TABLET | Freq: Every day | ORAL | Status: DC
  Administered 2021-04-26 – 2021-04-27 (×2): 25 mg via ORAL
  Filled 2021-04-26 (×2): qty 1

## 2021-04-26 MED ORDER — GABAPENTIN 300 MG PO CAPS
300.0000 mg | ORAL_CAPSULE | Freq: Three times a day (TID) | ORAL | Status: DC
  Administered 2021-04-26 – 2021-04-27 (×5): 300 mg via ORAL
  Filled 2021-04-26 (×5): qty 1

## 2021-04-26 MED FILL — Amiodarone HCl in Dextrose 4.14% IV Soln 360 MG/200ML: INTRAVENOUS | Qty: 200 | Status: AC

## 2021-04-26 NOTE — Progress Notes (Addendum)
Progress Note  Patient Name: Barry Mccoy Date of Encounter: 04/26/2021  [facility] Cardiologist: Peter Martinique, MD   Subjective   Feels well this morning.  No chest pain.  He is requesting that his frequency of Neurontin be increased for his foot pain.  Inpatient Medications    Scheduled Meds:  aspirin EC  81 mg Oral Daily   atorvastatin  80 mg Oral Daily   Chlorhexidine Gluconate Cloth  6 each Topical Daily   enoxaparin (LOVENOX) injection  40 mg Subcutaneous Q24H   gabapentin  300 mg Oral QHS   insulin aspart  0-15 Units Subcutaneous TID WC   metoprolol tartrate  12.5 mg Oral BID   pantoprazole  40 mg Oral Daily   sodium chloride flush  3 mL Intravenous Q12H   ticagrelor  90 mg Oral BID   Continuous Infusions:  sodium chloride     amiodarone 30 mg/hr (04/26/21 0700)   PRN Meds: sodium chloride, acetaminophen, nitroGLYCERIN, ondansetron (ZOFRAN) IV, oxymetazoline, sodium chloride flush   Vital Signs    Vitals:   04/26/21 0500 04/26/21 0600 04/26/21 0700 04/26/21 0744  BP:  100/75 105/62   Pulse: 70 67 65   Resp:      Temp: 98.1 F (36.7 C)   98.2 F (36.8 C)  TempSrc: Oral   Oral  SpO2: 97% 98% 97%   Weight:      Height:        Intake/Output Summary (Last 24 hours) at 04/26/2021 0948 Last data filed at 04/26/2021 0700 Gross per 24 hour  Intake 495.84 ml  Output 800 ml  Net -304.16 ml   Last 3 Weights 04/25/2021 11/21/2016 11/20/2016  Weight (lbs) 210 lb 215 lb 9.8 oz 214 lb 4.6 oz  Weight (kg) 95.255 kg 97.8 kg 97.2 kg      Telemetry    Normal sinus rhythm- Personally Reviewed  ECG    Normal sinus rhythm, inferior T wave inversions- Personally Reviewed  Physical Exam   GEN: No acute distress.   Neck: No JVD Cardiac: RRR, no murmurs, rubs, or gallops.  Respiratory: Clear to auscultation bilaterally. GI: Soft, nontender, non-distended  MS: No edema; No deformity.  2+ right radial pulse, no hematoma Neuro:  Nonfocal  Psych: Normal affect    Labs    High Sensitivity Troponin:   Recent Labs  Lab 04/25/21 0937 04/25/21 1223  TROPONINIHS 76* 17,150*      Chemistry Recent Labs  Lab 04/25/21 0937 04/25/21 0940 04/25/21 1223 04/26/21 0237  NA 132* 136 136 135  K 3.9 3.4* 4.4 4.8  CL 103 103 102 104  CO2 16*  --  22 25  GLUCOSE 332* 341* 206* 157*  BUN '13 12 9 10  '$ CREATININE 1.14 0.90 1.00 0.91  CALCIUM 9.1  --  9.8 9.2  PROT 6.1*  --  6.7  --   ALBUMIN 3.4*  --  4.0  --   AST 34  --  156*  --   ALT 23  --  43  --   ALKPHOS 58  --  62  --   BILITOT 0.6  --  0.6  --   GFRNONAA >60  --  >60 >60  ANIONGAP 13  --  12 6     Hematology Recent Labs  Lab 04/25/21 0937 04/25/21 0940 04/25/21 1223 04/26/21 0237  WBC 23.2*  --  25.1* 26.4*  RBC 5.09  --  5.34 4.61  HGB 15.8 16.0 17.0 14.4  HCT  46.2 47.0 49.7 42.9  MCV 90.8  --  93.1 93.1  MCH 31.0  --  31.8 31.2  MCHC 34.2  --  34.2 33.6  RDW 12.9  --  13.2 13.3  PLT 339  --  354 306    BNPNo results for input(s): BNP, PROBNP in the last 168 hours.   DDimer No results for input(s): DDIMER in the last 168 hours.   Radiology    CARDIAC CATHETERIZATION  Result Date: 04/25/2021   Prox RCA lesion is 100% stenosed.   A drug-eluting stent was successfully placed using a STENT ONYX FRONTIER 3.5X15.   Post intervention, there is a 0% residual stenosis. 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 Ventricular fibrillation x 1 post reperfusion with successful DCCV Plan: DAPT for one year. Continue IV amiodarone overnight. Assess LV function with Echo.   ECHOCARDIOGRAM COMPLETE  Result Date: 04/25/2021    ECHOCARDIOGRAM REPORT   Patient Name:   Barry Mccoy Date of Exam: 04/25/2021 Medical Rec #:  [id number]  Height:       74.0 in Accession #:    [id number] Weight:       215.6 lb Date of Birth:  1950-12-06  BSA:          2.244 m Patient Age:    70 years   BP:           124/73 mmHg Patient Gender: M          HR:            89 bpm. Exam Location:  Inpatient Procedure: 2D Echo, Cardiac Doppler and Color Doppler Indications:    STEMI  History:        Patient has prior history of Echocardiogram examinations, most                 recent 10/11/2016. Previous Myocardial Infarction; Risk                 Factors:Former Smoker and Diabetes. Left heart cath.  Sonographer:    Luisa Hart RDCS Referring Phys: [id number] PETER M Martinique  Sonographer Comments: Suboptimal apical window and suboptimal parasternal window. Image acquisition challenging due to patient body habitus and Image acquisition challenging due to respiratory motion. IMPRESSIONS  1. Septal and inferior wall hyopkinesis . Left ventricular ejection fraction, by estimation, is 35 to 40%. The left ventricle has moderately decreased function. The left ventricle demonstrates regional wall motion abnormalities (see scoring diagram/findings for description). The left ventricular internal cavity size was mildly dilated. Left ventricular diastolic parameters are consistent with Grade I diastolic dysfunction (impaired relaxation).  2. Right ventricular systolic function is mildly reduced. The right ventricular size is mildly enlarged. There is normal pulmonary artery systolic pressure.  3. The mitral valve is normal in structure. Mild mitral valve regurgitation. No evidence of mitral stenosis.  4. The aortic valve is tricuspid. There is mild calcification of the aortic valve. Aortic valve regurgitation is not visualized. Mild aortic valve sclerosis is present, with no evidence of aortic valve stenosis.  5. The inferior vena cava is normal in size with greater than 50% respiratory variability, suggesting right atrial pressure of 3 mmHg. FINDINGS  Left Ventricle: Septal and inferior wall hyopkinesis. Left ventricular ejection fraction, by estimation, is 35 to 40%. The left ventricle has moderately decreased function. The left ventricle demonstrates regional wall motion abnormalities. The left  ventricular internal cavity size was mildly dilated. There is  no left ventricular hypertrophy. Left ventricular diastolic parameters are consistent with Grade I diastolic dysfunction (impaired relaxation). Right Ventricle: The right ventricular size is mildly enlarged. No increase in right ventricular wall thickness. Right ventricular systolic function is mildly reduced. There is normal pulmonary artery systolic pressure. The tricuspid regurgitant velocity  is 1.57 m/s, and with an assumed right atrial pressure of 3 mmHg, the estimated right ventricular systolic pressure is 0000000 mmHg. Left Atrium: Left atrial size was normal in size. Right Atrium: Right atrial size was normal in size. Pericardium: There is no evidence of pericardial effusion. Mitral Valve: The mitral valve is normal in structure. Mild mitral valve regurgitation. No evidence of mitral valve stenosis. Tricuspid Valve: The tricuspid valve is normal in structure. Tricuspid valve regurgitation is not demonstrated. No evidence of tricuspid stenosis. Aortic Valve: The aortic valve is tricuspid. There is mild calcification of the aortic valve. Aortic valve regurgitation is not visualized. Mild aortic valve sclerosis is present, with no evidence of aortic valve stenosis. Aortic valve mean gradient measures 2.0 mmHg. Aortic valve peak gradient measures 3.6 mmHg. Aortic valve area, by VTI measures 1.68 cm. Pulmonic Valve: The pulmonic valve was normal in structure. Pulmonic valve regurgitation is not visualized. No evidence of pulmonic stenosis. Aorta: The aortic root is normal in size and structure. Venous: The inferior vena cava is normal in size with greater than 50% respiratory variability, suggesting right atrial pressure of 3 mmHg. IAS/Shunts: No atrial level shunt detected by color flow Doppler.  LEFT VENTRICLE PLAX 2D LVIDd:         4.90 cm     Diastology LVIDs:         4.40 cm     LV e' medial:    4.79 cm/s LV PW:         1.00 cm     LV E/e' medial:   15.9 LV IVS:        1.00 cm     LV e' lateral:   6.64 cm/s LVOT diam:     2.30 cm     LV E/e' lateral: 11.4 LV SV:         38 LV SV Index:   17 LVOT Area:     4.15 cm  LV Volumes (MOD) LV vol d, MOD A2C: 75.1 ml LV vol d, MOD A4C: 63.7 ml LV vol s, MOD A2C: 58.9 ml LV vol s, MOD A4C: 61.6 ml LV SV MOD A2C:     16.2 ml LV SV MOD A4C:     63.7 ml LV SV MOD BP:      10.8 ml RIGHT VENTRICLE RV Basal diam:  3.70 cm RV Mid diam:    2.00 cm LEFT ATRIUM             Index       RIGHT ATRIUM           Index LA diam:        3.40 cm 1.52 cm/m  RA Area:     14.60 cm LA Vol (A2C):   42.5 ml 18.94 ml/m RA Volume:   33.60 ml  14.97 ml/m LA Vol (A4C):   63.5 ml 28.30 ml/m LA Biplane Vol: 52.9 ml 23.57 ml/m  AORTIC VALVE                   PULMONIC VALVE AV Area (Vmax):    2.64 cm    PV Vmax:       0.82 m/s AV  Area (Vmean):   2.47 cm    PV Vmean:      56.300 cm/s AV Area (VTI):     1.68 cm    PV VTI:        0.164 m AV Vmax:           95.40 cm/s  PV Peak grad:  2.7 mmHg AV Vmean:          62.800 cm/s PV Mean grad:  1.5 mmHg AV VTI:            0.225 m AV Peak Grad:      3.6 mmHg AV Mean Grad:      2.0 mmHg LVOT Vmax:         60.60 cm/s LVOT Vmean:        37.300 cm/s LVOT VTI:          0.091 m LVOT/AV VTI ratio: 0.40  AORTA Ao Root diam: 3.10 cm Ao Asc diam:  3.20 cm MITRAL VALVE                TRICUSPID VALVE MV Area (PHT): 5.04 cm     TR Peak grad:   9.9 mmHg MV Decel Time: 151 msec     TR Vmax:        157.00 cm/s MV E velocity: 75.97 cm/s MV A velocity: 122.33 cm/s  SHUNTS MV E/A ratio:  0.62         Systemic VTI:  0.09 m                             Systemic Diam: 2.30 cm Jenkins Rouge MD Electronically signed by Jenkins Rouge MD Signature Date/Time: 04/25/2021/2:08:00 PM    Final     Cardiac Studies    Prox RCA lesion is 100% stenosed.   A drug-eluting stent was successfully placed using a STENT ONYX FRONTIER 3.5X15.   Post intervention, there is a 0% residual stenosis.   Single vessel occlusive CAD involving the  proximal RCA Successful PCI of the RCA with DES x 1.  Ventricular fibrillation x 1 post reperfusion with successful DCCV   Plan: DAPT for one year. Continue IV amiodarone overnight. Assess LV function with Echo.  Patient Profile     70 y.o. male with inferior MI.   Assessment & Plan    CAD/Inf MI: EF 35 to 40%.  Hopefully this will improve.  Blood pressure is borderline and too low to start aggressive heart failure therapy.  Continue beta-blocker for now.  As blood pressure increases, will intensify medications for heart failure.  Tobacco abuse: He does need to stop smoking and avoid all tobacco products.  Alcohol abuse: Noted in his chart.  No signs of withdrawal at this time.  Hyperlipidemia: Elevated triglycerides.  We will add fibrate to atorvastatin. Plan to transfer to tele.   DM: A1C 7.9.  Target A1C 7.0. Whole food plant based diet.  For questions or updates, please contact [place] Please consult [email or web address] for contact info under        Signed, Larae Grooms, MD  04/26/2021, 9:48 AM

## 2021-04-26 NOTE — Progress Notes (Signed)
CARDIAC REHAB PHASE I   Offered to walk with pt. Pt declines at this time, states he is trying to catch up on his rest. Pt educated on importance of ASA, Brilinta, and NTG. Pt given MI book along with heart healthy and diabetic diets. Reviewed site care, restrictions, and exercise guidelines. Will refer to CRP II [place] as pt is relocating back to that area. Will continue to follow.  [id number] Rufina Falco, RN BSN 04/26/2021 2:42 PM

## 2021-04-27 ENCOUNTER — Other Ambulatory Visit (HOSPITAL_COMMUNITY): Payer: Self-pay

## 2021-04-27 ENCOUNTER — Encounter (HOSPITAL_COMMUNITY): Payer: Self-pay | Admitting: Cardiology

## 2021-04-27 DIAGNOSIS — I2111 ST elevation (STEMI) myocardial infarction involving right coronary artery: Secondary | ICD-10-CM | POA: Diagnosis not present

## 2021-04-27 DIAGNOSIS — E119 Type 2 diabetes mellitus without complications: Secondary | ICD-10-CM | POA: Diagnosis not present

## 2021-04-27 DIAGNOSIS — Z720 Tobacco use: Secondary | ICD-10-CM | POA: Diagnosis not present

## 2021-04-27 DIAGNOSIS — E782 Mixed hyperlipidemia: Secondary | ICD-10-CM | POA: Diagnosis not present

## 2021-04-27 LAB — PATHOLOGIST SMEAR REVIEW

## 2021-04-27 LAB — GLUCOSE, CAPILLARY
Glucose-Capillary: 149 mg/dL — ABNORMAL HIGH (ref 70–99)
Glucose-Capillary: 105 mg/dL — ABNORMAL HIGH (ref 70–99)
Glucose-Capillary: 145 mg/dL — ABNORMAL HIGH (ref 70–99)

## 2021-04-27 MED ORDER — PANTOPRAZOLE SODIUM 40 MG PO TBEC: 40.0000 mg | DELAYED_RELEASE_TABLET | Freq: Every day | ORAL | 6 refills | Status: AC

## 2021-04-27 MED ORDER — TICAGRELOR 90 MG PO TABS
90.0000 mg | ORAL_TABLET | Freq: Two times a day (BID) | ORAL | 11 refills | Status: DC
Start: 2021-04-27 — End: 2021-07-27
  Filled 2021-04-27: qty 60, 30d supply, fill #0

## 2021-04-27 MED ORDER — ASPIRIN 81 MG PO TBEC
81.0000 mg | DELAYED_RELEASE_TABLET | Freq: Every day | ORAL | 3 refills | Status: DC
  Filled 2021-04-27: qty 90, 90d supply, fill #0

## 2021-04-27 MED ORDER — FENOFIBRATE 160 MG PO TABS
160.0000 mg | ORAL_TABLET | Freq: Every day | ORAL | 6 refills | Status: AC
  Filled 2021-04-27: qty 30, 30d supply, fill #0

## 2021-04-27 MED ORDER — METFORMIN HCL 500 MG PO TABS
500.0000 mg | ORAL_TABLET | Freq: Two times a day (BID) | ORAL | 2 refills | Status: DC
  Filled 2021-04-27: qty 60, 30d supply, fill #0

## 2021-04-27 MED ORDER — NITROGLYCERIN 0.4 MG SL SUBL
0.4000 mg | SUBLINGUAL_TABLET | SUBLINGUAL | 12 refills | Status: AC | PRN
  Filled 2021-04-27: qty 25, 7d supply, fill #0

## 2021-04-27 MED ORDER — FENOFIBRATE 160 MG PO TABS
160.0000 mg | ORAL_TABLET | Freq: Every day | ORAL | Status: DC
  Administered 2021-04-27: 160 mg via ORAL
  Filled 2021-04-27: qty 1

## 2021-04-27 MED ORDER — METOPROLOL SUCCINATE ER 25 MG PO TB24
25.0000 mg | ORAL_TABLET | Freq: Every day | ORAL | 6 refills | Status: DC
  Filled 2021-04-27: qty 30, 30d supply, fill #0

## 2021-04-27 MED ORDER — ATORVASTATIN CALCIUM 80 MG PO TABS
80.0000 mg | ORAL_TABLET | Freq: Every day | ORAL | 11 refills | Status: AC
  Filled 2021-04-27: qty 30, 30d supply, fill #0

## 2021-04-27 NOTE — Progress Notes (Addendum)
Heart Failure Stewardship Pharmacist Progress Note   PCP: [name], PA-C PCP-Cardiologist: Peter Martinique, MD    HPI:  70 yo M with PMH of anxiety, BPH, depression, and alcohol use. He presented the the ED with STEMI on 816. Taken to the cath lab and found to have single vessel occlusive CAD involving the proximal RCA s/p PCI with DES. An ECHO was done on 8/16 and LVEF is reduced to 35-40% with G1DD and mildly reduced RV systolic function.    Current HF Medications: Metoprolol XL 25 mg daily  Prior to admission HF Medications: None  Pertinent Lab Values: As of 8/17: Serum creatinine 0.91, BUN 10, Potassium 4.8, Sodium 135, Magnesium 2.0  A1c 7.9 (8/16)  Vital Signs: Weight: 210 lbs (admission weight: 210 lbs) Blood pressure: 90-110/60s  Heart rate: 60-70s   Medication Assistance / Insurance Benefits Check: Does the patient have prescription insurance?  Yes Type of insurance plan: [employer] Medicare  Does the patient qualify for medication assistance through manufacturers or grants?   Yes Eligible grants and/or patient assistance programs: Suzi [employer]s Medication assistance applications in progress: none  Medication assistance applications approved: none Approved medication assistance renewals will be completed by: pending  Outpatient Pharmacy:  Prior to admission outpatient pharmacy: [facility] Is the patient willing to use [facility] at discharge? Yes Is the patient willing to transition their outpatient pharmacy to utilize a [facility] outpatient pharmacy?   Pending    Assessment: 1. 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 NYHA class II symptoms. - Continue metoprolol XL 25 mg daily - BP too soft for ARB/Entresto or spironolactone at this time - If adding SGLT2i, would add Wilder Glade since it shares patient assistance program with Brilinta and allows for 30-day free card   Plan: 1) Medication changes recommended at this  time: - Add Farxiga 10 mg daily at discharge  2) Patient assistance: - Jardiance copay $47 per month - Farxiga copay $47 per month - Entresto copay $47 per month - Appreciate assistance from Willette Alma, CPhT - Can help enroll in patient assistance programs if medications are unaffordable Wilder Glade and Brilinta share the same program) - HF TOC appt scheduled for 8/24   Kerby Nora, PharmD, BCPS Heart Failure Stewardship Pharmacist Phone [phone]

## 2021-04-27 NOTE — Evaluation (Signed)
Occupational Therapy Evaluation Patient Details Name: Barry Mccoy MRN: [id number] DOB: 01-Dec-1950 Today's Date: 04/27/2021    History of Present Illness Pt is a 70 y/o male admitted 8/16 secondary to STEMI and was taken for emergent heart cath. PMH includes tobacco use, DM, HTN, splenic abscess s/p splenectomy.   Clinical Impression   PTA patient independent. Admitted for above and presenting with problem list below, including impaired balance, decreased activity tolerance and generalized weakness. He requires min guard for transfers and in room mobility using RW, up to min guard for ADLs.  VSS during session, but fatigues easily and requires seated rest break in restroom before mobility back to bed. He requires cueing for safety, pacing due to fatigue.  Pt tangential during session and requires redirection, but anticipate cognition is baseline. Pt reports "I just need to rest and I"ll be better".  Recommend continued OT services acutely and after dc at [facility] level to optimize independence and safety.     Follow Up Recommendations  Home health OT;Supervision - Intermittent    Equipment Recommendations  3 in 1 bedside commode;Other (comment) (RW)    Recommendations for Other Services       Precautions / Restrictions Precautions Precautions: Fall Restrictions Weight Bearing Restrictions: No      Mobility Bed Mobility Overal bed mobility: Needs Assistance Bed Mobility: Sit to Supine;Supine to Sit     Supine to sit: Supervision Sit to supine: Supervision   General bed mobility comments: for safety    Transfers Overall transfer level: Needs assistance Equipment used: Rolling walker (2 wheeled) Transfers: Sit to/from Stand Sit to Stand: Min guard         General transfer comment: cueing for hand placement and safety    Balance Overall balance assessment: Needs assistance Sitting-balance support: No upper extremity supported;Feet supported Sitting balance-Leahy Scale:  Fair     Standing balance support: Bilateral upper extremity supported;During functional activity Standing balance-Leahy Scale: Poor Standing balance comment: relies on BUE support                           ADL either performed or assessed with clinical judgement   ADL Overall ADL's : Needs assistance/impaired     Grooming: Min guard;Standing;Wash/dry hands           Upper Body Dressing : Set up;Sitting   Lower Body Dressing: Min guard;Sit to/from stand   Toilet Transfer: Min guard;Ambulation;RW           Functional mobility during ADLs: Min guard;Rolling walker;Cueing for safety General ADL Comments: pt limited by generalized weakness, decreased activity tolerance; several moments of weakness after washing hands at sink and requires cueing for safety and pacing (sitting on BSC)     Vision   Vision Assessment?: No apparent visual deficits     Perception     Praxis      Pertinent Vitals/Pain Pain Assessment: No/denies pain     Hand Dominance     Extremity/Trunk Assessment Upper Extremity Assessment Upper Extremity Assessment: Generalized weakness   Lower Extremity Assessment Lower Extremity Assessment: Defer to PT evaluation   Cervical / Trunk Assessment Cervical / Trunk Assessment: Normal   Communication Communication Communication: No difficulties   Cognition Arousal/Alertness: Awake/alert Behavior During Therapy: Restless;Impulsive Overall Cognitive Status: No family/caregiver present to determine baseline cognitive functioning  General Comments: pt with decreased safety awareness and problem sovling, but anticipate baseline. Tangential and requires redirection.  Short blessed test scored 2/28 (normal).   General Comments  VSS on RA    Exercises     Shoulder Instructions      Home Living Family/patient expects to be discharged to:: Other (Comment) (hotel) Living Arrangements: Alone                                Additional Comments: reports he has been [facility], has tub/shower and low commodes      Prior Functioning/Environment Level of Independence: Independent                 OT Problem List: Decreased strength;Decreased activity tolerance;Impaired balance (sitting and/or standing);Decreased safety awareness;Decreased knowledge of use of DME or AE;Decreased knowledge of precautions      OT Treatment/Interventions: Self-care/ADL training;Therapeutic exercise;Energy conservation;DME and/or AE instruction;Therapeutic activities;Balance training;Patient/family education    OT Goals(Current goals can be found in the care plan section) Acute Rehab OT Goals Patient Stated Goal: to get some sleep OT Goal Formulation: With patient Time For Goal Achievement: 05/11/21 Potential to Achieve Goals: Good  OT Frequency: Min 2X/week   Barriers to D/C:            Co-evaluation              AM-PAC OT "6 Clicks" Daily Activity     Outcome Measure Help from another person eating meals?: None Help from another person taking care of personal grooming?: A Little Help from another person toileting, which includes using toliet, bedpan, or urinal?: A Little Help from another person bathing (including washing, rinsing, drying)?: A Little Help from another person to put on and taking off regular upper body clothing?: A Little Help from another person to put on and taking off regular lower body clothing?: A Little 6 Click Score: 19   End of Session Equipment Utilized During Treatment: Rolling walker Nurse Communication: Mobility status  Activity Tolerance: Patient tolerated treatment well Patient left: in bed;with call bell/phone within reach;with bed alarm set  OT Visit Diagnosis: Other abnormalities of gait and mobility (R26.89);Muscle weakness (generalized) (M62.81)                Time: [id number] OT Time Calculation (min): 22  min Charges:  OT General Charges $OT Visit: 1 Visit OT Evaluation $OT Eval Moderate Complexity: 1 Mod  Jolaine Artist, OT Acute Rehabilitation Services Pager [phone] Office [phone]   Delight Stare 04/27/2021, 1:45 PM

## 2021-04-27 NOTE — Plan of Care (Signed)

## 2021-04-27 NOTE — Evaluation (Signed)
Physical Therapy Evaluation Patient Details Name: Barry Mccoy MRN: [id number] DOB: 06/13/1951 Today's Date: 04/27/2021   History of Present Illness  Pt is a 70 y/o male admitted 8/16 secondary to STEMI and was taken for emergent heart cath. PMH includes tobacco use, DM, HTN, splenic abscess s/p splenectomy.  Clinical Impression  Pt admitted secondary to problem above with deficits below. Pt  requiring min guard A for mobility tasks using RW. Pt with increased fatigue and required seated rest X1; VSS throughout. Pt very tangential and easily distracted, but suspect this is baseline. Feel pt would benefit from HHPT at d/c and use of RW. Will continue to follow acutely.     Follow Up Recommendations Home health PT    Equipment Recommendations  Rolling walker with 5" wheels    Recommendations for Other Services       Precautions / Restrictions Precautions Precautions: Fall Restrictions Weight Bearing Restrictions: No      Mobility  Bed Mobility Overal bed mobility: Needs Assistance Bed Mobility: Supine to Sit;Sit to Supine     Supine to sit: Min assist Sit to supine: Supervision   General bed mobility comments: Min A for trunk elevation.    Transfers Overall transfer level: Needs assistance Equipment used: Rolling walker (2 wheeled) Transfers: Sit to/from Stand Sit to Stand: Min guard         General transfer comment: Min guard for safety.  Ambulation/Gait Ambulation/Gait assistance: Min guard Gait Distance (Feet): 150 Feet Assistive device: Rolling walker (2 wheeled) Gait Pattern/deviations: Step-through pattern;Decreased stride length Gait velocity: Decreased   General Gait Details: Min guard for safety. Pt required 1 seated rest at end of gait, as pt reporting increased fatigue. VSS.  Stairs            Wheelchair Mobility    Modified Rankin (Stroke Patients Only)       Balance Overall balance assessment: Needs assistance Sitting-balance support:  No upper extremity supported;Feet supported Sitting balance-Leahy Scale: Fair     Standing balance support: Bilateral upper extremity supported;During functional activity Standing balance-Leahy Scale: Poor Standing balance comment: REliant on BUE support                             Pertinent Vitals/Pain Pain Assessment: No/denies pain    Home Living Family/patient expects to be discharged to:: Other (Comment) [facility]) Living Arrangements: Alone               Additional Comments: Reports he has been staying at the [facility]. Has a flight of steps to go up.    Prior Function Level of Independence: Independent               Hand Dominance        Extremity/Trunk Assessment   Upper Extremity Assessment Upper Extremity Assessment: Defer to OT evaluation    Lower Extremity Assessment Lower Extremity Assessment: Generalized weakness (reports hx of peripheral neuropathy.)    Cervical / Trunk Assessment Cervical / Trunk Assessment: Normal  Communication   Communication: No difficulties  Cognition Arousal/Alertness: Awake/alert Behavior During Therapy: Restless;Impulsive Overall Cognitive Status: No family/caregiver present to determine baseline cognitive functioning                                 General Comments: Likely close to baseline. Tangential and very easily distracted.      General Comments  Exercises     Assessment/Plan    PT Assessment Patient needs continued PT services  PT Problem List Decreased strength;Decreased balance;Decreased activity tolerance;Decreased mobility;Decreased knowledge of use of DME;Decreased knowledge of precautions       PT Treatment Interventions DME instruction;Gait training;Stair training;Functional mobility training;Therapeutic activities;Balance training;Therapeutic exercise;Patient/family education    PT Goals (Current goals can be found in the Care Plan section)  Acute Rehab PT  Goals Patient Stated Goal: to get some sleep PT Goal Formulation: With patient Time For Goal Achievement: 05/11/21 Potential to Achieve Goals: Good    Frequency Min 3X/week   Barriers to discharge        Co-evaluation               AM-PAC PT "6 Clicks" Mobility  Outcome Measure Help needed turning from your back to your side while in a flat bed without using bedrails?: None Help needed moving from lying on your back to sitting on the side of a flat bed without using bedrails?: A Little Help needed moving to and from a bed to a chair (including a wheelchair)?: A Little Help needed standing up from a chair using your arms (e.g., wheelchair or bedside chair)?: A Little Help needed to walk in hospital room?: A Little Help needed climbing 3-5 steps with a railing? : A Little 6 Click Score: 19    End of Session Equipment Utilized During Treatment: Gait belt Activity Tolerance: Patient tolerated treatment well Patient left: in bed;with call bell/phone within reach Nurse Communication: Mobility status PT Visit Diagnosis: Muscle weakness (generalized) (M62.81);Other abnormalities of gait and mobility (R26.89)    Time: [id number] PT Time Calculation (min) (ACUTE ONLY): 18 min   Charges:   PT Evaluation $PT Eval Moderate Complexity: 1 Mod          Reuel Derby, PT, DPT  Acute Rehabilitation Services  Pager: [phone] Office: [phone]   Rudean Hitt 04/27/2021, 1:07 PM

## 2021-04-27 NOTE — Progress Notes (Signed)
CARDIAC REHAB PHASE I   PRE:  Rate/Rhythm: 61 SR  BP:  Sitting: 114/67 --> 107/75      SaO2: 96 RA  MODE:  Ambulation: 160 ft   POST:  Rate/Rhythm: 78 SR  BP:  Sitting: 105/93    SaO2: 99 RA   Pt agreeable to ambulate. Pt c/o dizziness upon rising, sat pt down and BP rechecked. BP stable, pt agreeable to try to walk. Pt ambulated 115f in hallway assist of one with front wheel walker. Pt impulsive and slightly unsteady on his feet. Pt returned to recliner. While exchanging O2 probe, tremor noted in pts hands. Pt denies consistent alcohol use, states only on occasion. RN made aware. Reinforced MI/stent education. Pt able to name antiplatelets prescribed. Encouraged continued ambulation with emphasis on safety. Call bell and bedside table within reach. Referred to [facility] [place]. Will continue to follow.  [id number] Falco RN BSN 04/27/2021 9:34 AM

## 2021-04-27 NOTE — TOC Benefit Eligibility Note (Signed)
Transition of Care [facility]) Benefit Eligibility Note    Patient Details  Name: Barry Mccoy MRN: [id number] Date of Birth: March 12, 1951   Medication/Dose: Kary Kos  90 MG BID     Tier: 3 Drug  Prescription Coverage Preferred Pharmacy: [facility] [name] with Person/Company/Phone Number:: JASMINE    @ [employer] [id number] #  [phone]  Co-Pay: $47.00  Prior Approval: No  Deductible: Unmet (OUT-OF-POCKET:UNMET)       Memory Argue Phone Number: 04/27/2021, 4:51 PM

## 2021-04-27 NOTE — TOC Initial Note (Addendum)
Transition of Care [facility]) - Initial/Assessment Note    Patient Details  Name: Barry Mccoy MRN: [id number] Date of Birth: [birth year]  Transition of Care [facility]) CM/SW Contact:    Erenest Rasher, RN Phone Number: [phone] 04/27/2021, 4:11 PM  Clinical Narrative:                 HF TOC CM referral to follow up with patient. TOC CM/CSW at bedside spoke to pt. Pt declined Rolling Walker. Offered choice for [facility] to [facility], with [place]. Contacted [facility] rep, Stacie and provided info on pt currently in hotel and looking for permanent housing. Has a home in [place] he is planning to rent. His dtr, Tera is very supportive and will provide transportation home. Attending updated and requested [facility] PT/RN orders with F2F.   Expected Discharge Plan: [place] Barriers to Discharge: No Barriers Identified   Patient Goals and CMS Choice Patient states their goals for this hospitalization and ongoing recovery are:: wants a permanent place to stay CMS Medicare.gov Compare Post Acute Care list provided to:: Patient Choice offered to / list presented to : Patient  Expected Discharge Plan and Services Expected Discharge Plan: [place] In-house Referral: Clinical Social Work Discharge Planning Services: CM Consult Post Acute Care Choice: [place] arrangements for the past 2 months: Hotel/Motel                           HH Arranged: PT HH Agency: [place] Date [place]: 04/27/21 Time [place]: 1609 Representative spoke with at [place]: Freddi Starr  Prior Living Arrangements/Services Living arrangements for the past 2 months: Hotel/Motel Lives with:: Self Patient language and need for interpreter reviewed:: Yes Do you feel safe going back to the place where you live?: Yes      Need for Family Participation in Patient Care: No (Comment) Care giver support system in place?: No (comment)    Criminal Activity/Legal Involvement Pertinent to Current Situation/Hospitalization: No - Comment as needed  Activities of Daily Living Home Assistive Devices/Equipment: None ADL Screening (condition at time of admission) Patient's cognitive ability adequate to safely complete daily activities?: Yes Is the patient deaf or have difficulty hearing?: No Does the patient have difficulty seeing, even when wearing glasses/contacts?: No Does the patient have difficulty concentrating, remembering, or making decisions?: No Patient able to express need for assistance with ADLs?: No Does the patient have difficulty dressing or bathing?: No Independently performs ADLs?: Yes (appropriate for developmental age) Does the patient have difficulty walking or climbing stairs?: No Weakness of Legs: None Weakness of Arms/Hands: None  Permission Sought/Granted Permission sought to share information with : Case Manager, Family Supports, PCP Permission granted to share information with : Yes, Verbal Permission Granted  Share Information with NAME: Soil scientist  Permission granted to share info w AGENCY: [place] granted to share info w Relationship: daughter  Permission granted to share info w Contact Information: [phone]  Emotional Assessment Appearance:: Appears stated age Attitude/Demeanor/Rapport: Gracious Affect (typically observed): Accepting Orientation: : Oriented to Place, Oriented to Self, Oriented to  Time, Oriented to Situation   Psych Involvement: No (comment)  Admission diagnosis:  STEMI involving right coronary artery ([place]) [I21.11] Patient Active Problem List   Diagnosis Date Noted   STEMI involving right coronary artery ([place]) 04/25/2021   Type 2 diabetes mellitus without complication, without long-term current use of  insulin ([place]) 04/25/2021   Acute pancreatitis    GI bleed 11/06/2016   Acute blood loss anemia 11/06/2016   Nausea with vomiting 11/06/2016   Alcohol  abuse 11/06/2016   UTI (urinary tract infection) 10/23/2016   Urinary retention 10/23/2016   Positive D dimer    Severe single current episode of major depressive disorder, with psychotic features ([place])    Normocytic anemia 10/01/2016   Acute GI bleeding 10/01/2016   Grief reaction 04/19/2016   Depression 04/19/2016   Benign prostatic hyperplasia 04/19/2016   Testosterone deficiency 10/26/2015   Vitamin D deficiency 10/21/2015   Tobacco abuse 10/17/2015   Chronic pain syndrome 10/17/2015   Anxiety    Cervical spine disease    Disorder of lumbar spine    PCP:  [name], PA-C Pharmacy:   [place] ([place]), [place] - [address] [place] [address] Phone: [phone] Fax: [place] [address] [place] [address] Phone: [phone] Fax: [phone]     Social Determinants of Health (SDOH) Interventions Food Insecurity Interventions: Other (Comment) (HF TOC consult) Financial Strain Interventions: Other (Comment) (HF TOC consulted) Housing Interventions: Other (Comment) (staying at [employer] in [place]. Unable to afford weekly rate. Unsure if he has a place to go after discharge, plan to get house in [place]) Social Connections Interventions: Other (Comment) (SW/NCM consult placed) Transportation Interventions: Intervention Not Indicated  Readmission Risk Interventions No flowsheet data found.

## 2021-04-27 NOTE — Progress Notes (Signed)
Pt discharged to home with daughter Barry Mccoy. Provided patient discharge education and presented AVS summary.

## 2021-04-27 NOTE — Progress Notes (Signed)
Heart Failure Nurse Navigator Progress Note  PCP: [name], PA-C PCP-Cardiologist: none Admission Diagnosis: STEMI Admitted from: motel  Presentation:   Barry Mccoy presented 8/16 with chest pain, found to have STEMI. Cardiac cath performed with PCI and DEC to RCA. Pt interactive during interview process, difficult to stay on task as patient would get stuck on tangent and need redirection to original questions. Pt stated he lives at the [facility] currently, not sure if he still has a room available. States his friend was looking at a potential rental house in [place] today. Pt states he has "skimmed on food" d/t financial strain, states he does not have food stamps/support. HF TOC team consulted for assistance regarding safe discharge plan. Pt states he rarely drinks, 1-2x/mo. Continues to smoke 1 PPD x50+ years, encouraged to quit. Pt states he has a reliable vehicle, educated on driving restrictions x 2 weeks. Offered cone transportation services, pt declined. Stated daughter would be able to help out or another friend. Pt stated non-compliance with medications, due to cost and forgetfulness. Explained importance of new medication regimen. Had HF pharmacist change prescriptions to [facility] pharmacy to bring to bedside prior to DC. On fixed SSI income, ~$2000/mo.  ECHO/ LVEF: 35-40%, G1DD, mildly reduced RV.   Clinical Course:  Past Medical History:  Diagnosis Date   Anxiety    BPH (benign prostatic hyperplasia)    Cervical spine disease    Chronic kidney disease    kidney stones   Depression    Disorder of lumbar spine    ETOH abuse    GI bleed    Muscle spasm      Social History   Socioeconomic History   Marital status: Widowed    Spouse name: Not on file   Number of children: 2   Years of education: Not on file   Highest education level: Some college, no degree  Occupational History   Not on file  Tobacco Use   Smoking status: Every Day    Packs/day: 1.00    Years:  50.00    Pack years: 50.00    Types: Cigarettes   Smokeless tobacco: Never   Tobacco comments:    attempting to quit- down to 8 a day   Vaping Use   Vaping Use: Never used  Substance and Sexual Activity   Alcohol use: No    Comment: 2 drinks a month   Drug use: Not Currently    Types: Marijuana   Sexual activity: Yes  Other Topics Concern   Not on file  Social History Narrative   ** Merged History Encounter **       Social Determinants of Health   Financial Resource Strain: High Risk   Difficulty of Paying Living Expenses: Very hard  Food Insecurity: Food Insecurity Present   Worried About Charity fundraiser in the Last Year: Sometimes true   Ran Out of Food in the Last Year: Never true  Transportation Needs: No Transportation Needs   Lack of Transportation (Medical): No   Lack of Transportation (Non-Medical): No  Physical Activity: Not on file  Stress: Not on file  Social Connections: Socially Isolated   Frequency of Communication with Friends and Family: Once a week   Frequency of Social Gatherings with Friends and Family: Never   Attends Religious Services: Never   Marine scientist or Organizations: No   Attends Archivist Meetings: Never   Marital Status: Widowed    High Risk Criteria  for Readmission and/or Poor Patient Outcomes: Heart failure hospital admissions (last 6 months): 1  No Show rate: 5% Difficult social situation: yes Demonstrates medication adherence: no Primary Language: English Literacy level: able to read/write and comprehend. Compliance concerns.  Education Assessment and Provision:  Detailed education and instructions provided on heart failure disease management including the following:  Signs and symptoms of Heart Failure When to call the physician Importance of daily weights Low sodium diet Fluid restriction Medication management Anticipated future follow-up appointments  Patient education given on each of the  above topics.  Patient acknowledges understanding via teach back method and acceptance of all instructions.  Education Materials:  "Living Better With Heart Failure" Booklet, HF zone tool, & Daily Weight Tracker Tool.  Patient has scale at home: no, will give at [place] visit. Patient has pill box at home: no, will give at [place] visit.     Barriers of Care:   -stable housing -anxiety -medication compliance -low social support -financial strain -food insecurity  Considerations/Referrals:   Referral made to Heart Failure Pharmacist Stewardship: yes Referral made to Heart & Vascular TOC clinic: yes, 8/24  Items for Follow-up on DC/TOC: -medication optimization -medication compliance -dietary compliance -continued education -housing concerns -medication cost -food insecurity clarification  Pricilla Holm, MSN, RN Heart Failure Nurse Navigator [phone]

## 2021-04-27 NOTE — Progress Notes (Signed)
Progress Note  Patient Name: Barry Mccoy Date of Encounter: 04/27/2021  [facility] Cardiologist: Peter Martinique, MD   Subjective   No further chest pain.  He is very grateful for the care he received.  He did feel that he was lightheaded when he tried to walk with cardiac rehab today.  He felt dizzy.  This was a bit troubling to him.  Inpatient Medications    Scheduled Meds:  aspirin EC  81 mg Oral Daily   atorvastatin  80 mg Oral Daily   Chlorhexidine Gluconate Cloth  6 each Topical Daily   enoxaparin (LOVENOX) injection  40 mg Subcutaneous Q24H   fenofibrate  160 mg Oral Daily   gabapentin  300 mg Oral TID   insulin aspart  0-15 Units Subcutaneous TID WC   metoprolol succinate  25 mg Oral Daily   pantoprazole  40 mg Oral Daily   sodium chloride flush  3 mL Intravenous Q12H   ticagrelor  90 mg Oral BID   Continuous Infusions:  sodium chloride     PRN Meds: sodium chloride, acetaminophen, nitroGLYCERIN, ondansetron (ZOFRAN) IV, oxymetazoline, sodium chloride flush   Vital Signs    Vitals:   04/27/21 0700 04/27/21 0734 04/27/21 0800 04/27/21 1123  BP: (!) 84/59  106/63   Pulse: 63  63   Resp:   16   Temp:  98.5 F (36.9 C)  98.4 F (36.9 C)  TempSrc:  Oral  Oral  SpO2: 93%  96%   Weight:      Height:        Intake/Output Summary (Last 24 hours) at 04/27/2021 1126 Last data filed at 04/27/2021 0800 Gross per 24 hour  Intake --  Output 1950 ml  Net -1950 ml   Last 3 Weights 04/25/2021 11/21/2016 11/20/2016  Weight (lbs) 210 lb 215 lb 9.8 oz 214 lb 4.6 oz  Weight (kg) 95.255 kg 97.8 kg 97.2 kg      Telemetry    Normal sinus rhythm- Personally Reviewed  ECG    Normal sinus rhythm, T wave inversions inferiorly, prolonged QT interval persists- Personally Reviewed  Physical Exam   GEN: No acute distress.   Neck: No JVD Cardiac: RRR, no murmurs, rubs, or gallops.  Respiratory: Clear to auscultation bilaterally. GI: Soft, nontender, non-distended  MS:  No edema; No deformity. Neuro:  Nonfocal  Psych: Normal affect   Labs    High Sensitivity Troponin:   Recent Labs  Lab 04/25/21 0937 04/25/21 1223  TROPONINIHS 76* 17,150*      Chemistry Recent Labs  Lab 04/25/21 0937 04/25/21 0940 04/25/21 1223 04/26/21 0237  NA 132* 136 136 135  K 3.9 3.4* 4.4 4.8  CL 103 103 102 104  CO2 16*  --  22 25  GLUCOSE 332* 341* 206* 157*  BUN '13 12 9 10  '$ CREATININE 1.14 0.90 1.00 0.91  CALCIUM 9.1  --  9.8 9.2  PROT 6.1*  --  6.7  --   ALBUMIN 3.4*  --  4.0  --   AST 34  --  156*  --   ALT 23  --  43  --   ALKPHOS 58  --  62  --   BILITOT 0.6  --  0.6  --   GFRNONAA >60  --  >60 >60  ANIONGAP 13  --  12 6     Hematology Recent Labs  Lab 04/25/21 0937 04/25/21 0940 04/25/21 1223 04/26/21 0237  WBC 23.2*  --  25.1* 26.4*  RBC 5.09  --  5.34 4.61  HGB 15.8 16.0 17.0 14.4  HCT 46.2 47.0 49.7 42.9  MCV 90.8  --  93.1 93.1  MCH 31.0  --  31.8 31.2  MCHC 34.2  --  34.2 33.6  RDW 12.9  --  13.2 13.3  PLT 339  --  354 306    BNPNo results for input(s): BNP, PROBNP in the last 168 hours.   DDimer No results for input(s): DDIMER in the last 168 hours.   Radiology    ECHOCARDIOGRAM COMPLETE  Result Date: 04/25/2021    ECHOCARDIOGRAM REPORT   Patient Name:   Barry Mccoy Date of Exam: 04/25/2021 Medical Rec #:  [id number]  Height:       74.0 in Accession #:    [id number] Weight:       215.6 lb Date of Birth:  1951/07/30  BSA:          2.244 m Patient Age:    70 years   BP:           124/73 mmHg Patient Gender: M          HR:           89 bpm. Exam Location:  Inpatient Procedure: 2D Echo, Cardiac Doppler and Color Doppler Indications:    STEMI  History:        Patient has prior history of Echocardiogram examinations, most                 recent 10/11/2016. Previous Myocardial Infarction; Risk                 Factors:Former Smoker and Diabetes. Left heart cath.  Sonographer:    Luisa Hart RDCS Referring Phys: [id number] PETER M Martinique  Sonographer  Comments: Suboptimal apical window and suboptimal parasternal window. Image acquisition challenging due to patient body habitus and Image acquisition challenging due to respiratory motion. IMPRESSIONS  1. Septal and inferior wall hyopkinesis . Left ventricular ejection fraction, by estimation, is 35 to 40%. The left ventricle has moderately decreased function. The left ventricle demonstrates regional wall motion abnormalities (see scoring diagram/findings for description). The left ventricular internal cavity size was mildly dilated. Left ventricular diastolic parameters are consistent with Grade I diastolic dysfunction (impaired relaxation).  2. Right ventricular systolic function is mildly reduced. The right ventricular size is mildly enlarged. There is normal pulmonary artery systolic pressure.  3. The mitral valve is normal in structure. Mild mitral valve regurgitation. No evidence of mitral stenosis.  4. The aortic valve is tricuspid. There is mild calcification of the aortic valve. Aortic valve regurgitation is not visualized. Mild aortic valve sclerosis is present, with no evidence of aortic valve stenosis.  5. The inferior vena cava is normal in size with greater than 50% respiratory variability, suggesting right atrial pressure of 3 mmHg. FINDINGS  Left Ventricle: Septal and inferior wall hyopkinesis. Left ventricular ejection fraction, by estimation, is 35 to 40%. The left ventricle has moderately decreased function. The left ventricle demonstrates regional wall motion abnormalities. The left ventricular internal cavity size was mildly dilated. There is no left ventricular hypertrophy. Left ventricular diastolic parameters are consistent with Grade I diastolic dysfunction (impaired relaxation). Right Ventricle: The right ventricular size is mildly enlarged. No increase in right ventricular wall thickness. Right ventricular systolic function is mildly reduced. There is normal pulmonary artery systolic  pressure. The tricuspid regurgitant velocity  is 1.57 m/s, and with an assumed right atrial pressure  of 3 mmHg, the estimated right ventricular systolic pressure is 0000000 mmHg. Left Atrium: Left atrial size was normal in size. Right Atrium: Right atrial size was normal in size. Pericardium: There is no evidence of pericardial effusion. Mitral Valve: The mitral valve is normal in structure. Mild mitral valve regurgitation. No evidence of mitral valve stenosis. Tricuspid Valve: The tricuspid valve is normal in structure. Tricuspid valve regurgitation is not demonstrated. No evidence of tricuspid stenosis. Aortic Valve: The aortic valve is tricuspid. There is mild calcification of the aortic valve. Aortic valve regurgitation is not visualized. Mild aortic valve sclerosis is present, with no evidence of aortic valve stenosis. Aortic valve mean gradient measures 2.0 mmHg. Aortic valve peak gradient measures 3.6 mmHg. Aortic valve area, by VTI measures 1.68 cm. Pulmonic Valve: The pulmonic valve was normal in structure. Pulmonic valve regurgitation is not visualized. No evidence of pulmonic stenosis. Aorta: The aortic root is normal in size and structure. Venous: The inferior vena cava is normal in size with greater than 50% respiratory variability, suggesting right atrial pressure of 3 mmHg. IAS/Shunts: No atrial level shunt detected by color flow Doppler.  LEFT VENTRICLE PLAX 2D LVIDd:         4.90 cm     Diastology LVIDs:         4.40 cm     LV e' medial:    4.79 cm/s LV PW:         1.00 cm     LV E/e' medial:  15.9 LV IVS:        1.00 cm     LV e' lateral:   6.64 cm/s LVOT diam:     2.30 cm     LV E/e' lateral: 11.4 LV SV:         38 LV SV Index:   17 LVOT Area:     4.15 cm  LV Volumes (MOD) LV vol d, MOD A2C: 75.1 ml LV vol d, MOD A4C: 63.7 ml LV vol s, MOD A2C: 58.9 ml LV vol s, MOD A4C: 61.6 ml LV SV MOD A2C:     16.2 ml LV SV MOD A4C:     63.7 ml LV SV MOD BP:      10.8 ml RIGHT VENTRICLE RV Basal diam:  3.70 cm  RV Mid diam:    2.00 cm LEFT ATRIUM             Index       RIGHT ATRIUM           Index LA diam:        3.40 cm 1.52 cm/m  RA Area:     14.60 cm LA Vol (A2C):   42.5 ml 18.94 ml/m RA Volume:   33.60 ml  14.97 ml/m LA Vol (A4C):   63.5 ml 28.30 ml/m LA Biplane Vol: 52.9 ml 23.57 ml/m  AORTIC VALVE                   PULMONIC VALVE AV Area (Vmax):    2.64 cm    PV Vmax:       0.82 m/s AV Area (Vmean):   2.47 cm    PV Vmean:      56.300 cm/s AV Area (VTI):     1.68 cm    PV VTI:        0.164 m AV Vmax:           95.40 cm/s  PV Peak grad:  2.7 mmHg  AV Vmean:          62.800 cm/s PV Mean grad:  1.5 mmHg AV VTI:            0.225 m AV Peak Grad:      3.6 mmHg AV Mean Grad:      2.0 mmHg LVOT Vmax:         60.60 cm/s LVOT Vmean:        37.300 cm/s LVOT VTI:          0.091 m LVOT/AV VTI ratio: 0.40  AORTA Ao Root diam: 3.10 cm Ao Asc diam:  3.20 cm MITRAL VALVE                TRICUSPID VALVE MV Area (PHT): 5.04 cm     TR Peak grad:   9.9 mmHg MV Decel Time: 151 msec     TR Vmax:        157.00 cm/s MV E velocity: 75.97 cm/s MV A velocity: 122.33 cm/s  SHUNTS MV E/A ratio:  0.62         Systemic VTI:  0.09 m                             Systemic Diam: 2.30 cm Jenkins Rouge MD Electronically signed by Jenkins Rouge MD Signature Date/Time: 04/25/2021/2:08:00 PM    Final     Cardiac Studies   Cath results reviewed, labs reviewed  Patient Profile     70 y.o. male with inferior MI, ejection fraction 35 to 40%.  T wave changes noted on ECG  Assessment & Plan    CAD/inferior STEMI: Continue aggressive secondary prevention.  Dual antiplatelet therapy rationale explained to patient.  We will plan for 30 days Brilinta followed by clopidogrel for cost savings.  Low blood pressure limits our medical therapy for his LV dysfunction.  He is on low-dose beta-blocker.  We will hold off on starting losartan or Entresto which were both discussed.  Hopefully, Delene Loll can be started as an outpatient if his blood pressure  increases when he is back to his normal routine.  Hyperlipidemia: Continue high-dose statin.  Fibrate also added for high triglycerides.  Unsteadiness: We will get PT consult.  Need to know if he would benefit from home health PT.  There is a mention of an alcohol history.  He has been described as shaky at times.  Hopefully, if he feels more steady on his feet, he can be discharged later today.  For questions or updates, please contact [facility] Please consult [email or web address] for contact info under        Signed, Larae Grooms, MD  04/27/2021, 11:26 AM

## 2021-04-27 NOTE — Progress Notes (Signed)
Patient with intermittent dizziness. Recommended HH PT/OT. Patient lives in hotel.  One daughter lives in [place] and another in [place].  Patient is asking "can I stay here one week and get some money from hospital".  Will consult social worker.

## 2021-04-27 NOTE — Discharge Summary (Addendum)
Discharge Summary    Patient ID: Barry Mccoy MRN: [id number]; DOB: 03/25/1951  Admit date: 04/25/2021 Discharge date: 04/27/2021  PCP:  [name], PA-C   [facility] Providers Cardiologist:  Peter Martinique, MD   {    Discharge Diagnoses    Principal Problem:   STEMI involving right coronary artery [facility]) Active Problems:   Tobacco abuse   Type 2 diabetes mellitus without complication, without long-term current use of insulin ([place])    ICM    HLD    Alcohol abuse      Diagnostic Studies/Procedures     Echo 04/25/21  1. Septal and inferior wall hyopkinesis . Left ventricular ejection  fraction, by estimation, is 35 to 40%. The left ventricle has moderately  decreased function. The left ventricle demonstrates regional wall motion  abnormalities (see scoring  diagram/findings for description). The left ventricular internal cavity  size was mildly dilated. Left ventricular diastolic parameters are  consistent with Grade I diastolic dysfunction (impaired relaxation).   2. Right ventricular systolic function is mildly reduced. The right  ventricular size is mildly enlarged. There is normal pulmonary artery  systolic pressure.   3. The mitral valve is normal in structure. Mild mitral valve  regurgitation. No evidence of mitral stenosis.   4. The aortic valve is tricuspid. There is mild calcification of the  aortic valve. Aortic valve regurgitation is not visualized. Mild aortic  valve sclerosis is present, with no evidence of aortic valve stenosis.   5. The inferior vena cava is normal in size with greater than 50%  respiratory variability, suggesting right atrial pressure of 3 mmHg.    CORONARY STENT INTERVENTION  LEFT HEART CATH AND CORONARY ANGIOGRAPHY   Conclusion      Prox RCA lesion is 100% stenosed.   A drug-eluting stent was successfully placed using a STENT ONYX FRONTIER 3.5X15.   Post intervention, there is a 0% residual stenosis.   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  Ventricular fibrillation x 1 post reperfusion with successful DCCV   Plan: DAPT for one year. Continue IV amiodarone overnight. Assess LV function with Echo.    Diagnostic Dominance: Right Intervention     History of Present Illness     Barry Mccoy is a 70 y.o. male with hx of hypertension, hyperlipidemia, diabetes mellitus, pancreatitis with pseudocyst, splenic abscess s/p splenectomy, remote tobacco smoking/alcohol abuse and CLL who is being seen for the evaluation of inferior STEMI.  Barry Mccoy presented by EMS with code STEMI.  Reported developing chest pain 1 hour prior to arrival.  Described chest pain as pressure sensation.  Associated with diaphoresis and shortness of breath.  No similar episode.  EMS was called and found to have inferior STEMI and activation of code.  He received aspirin 325 mg.  Upon arrival to ER still having chest discomfort and diaphoresis.  He is taken to cardiac cath for immediate catheterization.  Denies prior similar episode.  History of significant GI bleed in 2018 requiring multiple units of transfusion.  Had episode of SVT at that time.  Hospital Course   1.Acute inferior MI Emergent cath showed single vessel occlusive CAD involving the proximal RCA s/p successful PCI of the RCA with DES x 1. Cath complicated by ventricular fibrillation x 1 post reperfusion with successful DCCV. Treated with IV amiodarone overnight. No recurrent arrhythmias. Ambulated without chest pain but was dizzy. CRP II. DAPT with ASA and Brilinta.  Continue statin and BB.   2. ICM - Echo showed EF of 35-40%, grade 1 DD and regional WM abnormality. Auto diuresed 2.1L. No daily weight. Did not required diuretics. Continue Toprol XL. Unable to add GDMT due to soft blood pressure. Plan to add Losartan/Entresto, Arlyce Harman or SGLT2 inhibitor during outpatient follow up. Recommended low sodium diet.   3.  HLD -04/26/2021: Cholesterol 163; HDL 31; LDL Cholesterol 85; Triglycerides 234; VLDL 47  - Continue high intensity statin and fibrates -Consider OP f/u labs 6-8 weeks given statin initiation this admission.   4. DM - HgbA1c 7.9 - Treated with SSI while admitted  - Given script of metformin >> recommended to follow up with PCP  5. Unsteadiness - Seen by PT and recommended HH PT and OT  6. Hx of alcohol abuse - treated with CIWA protocol   Did the patient have an acute coronary syndrome (MI, NSTEMI, STEMI, etc) this admission?:  Yes                               AHA/ACC Clinical Performance & Quality Measures: Aspirin prescribed? - Yes ADP Receptor Inhibitor (Plavix/Clopidogrel, Brilinta/Ticagrelor or Effient/Prasugrel) prescribed (includes medically managed patients)? - Yes Beta Blocker prescribed? - Yes High Intensity Statin (Lipitor 40-'80mg'$  or Crestor 20-'40mg'$ ) prescribed? - Yes EF assessed during THIS hospitalization? - Yes For EF <40%, was ACEI/ARB prescribed? - No - Reason:  Plan to add as outpatient. BP soft here For EF <40%, Aldosterone Antagonist (Spironolactone or Eplerenone) prescribed? - No - Reason:  Plan to add as outpatient. BP soft Cardiac Rehab Phase II ordered (including medically managed patients)? - Yes      Discharge Vitals Blood pressure 109/62, pulse 77, temperature 98.5 F (36.9 C), temperature source Oral, resp. rate 16, height '5\' 7"'$  (1.702 m), weight 95.3 kg, SpO2 95 %.  Filed Weights   04/25/21 1300  Weight: 95.3 kg    Labs & Radiologic Studies    CBC Recent Labs    04/25/21 0937 04/25/21 0940 04/25/21 1223 04/26/21 0237  WBC 23.2*  --  25.1* 26.4*  NEUTROABS 5.5  --  9.5*  --   HGB 15.8   < > 17.0 14.4  HCT 46.2   < > 49.7 42.9  MCV 90.8  --  93.1 93.1  PLT 339  --  354 306   < > = values in this interval not displayed.   Basic Metabolic Panel Recent Labs    04/25/21 1223 04/26/21 0237  NA 136 135  K 4.4 4.8  CL 102 104  CO2 22 25   GLUCOSE 206* 157*  BUN 9 10  CREATININE 1.00 0.91  CALCIUM 9.8 9.2  MG 2.0  --    Liver Function Tests Recent Labs    04/25/21 0937 04/25/21 1223  AST 34 156*  ALT 23 43  ALKPHOS 58 62  BILITOT 0.6 0.6  PROT 6.1* 6.7  ALBUMIN 3.4* 4.0    High Sensitivity Troponin:   Recent Labs  Lab 04/25/21 0937 04/25/21 1223  TROPONINIHS 76* 17,150*     Hemoglobin A1C Recent Labs    04/25/21 1223  HGBA1C 7.9*   Fasting Lipid Panel Recent Labs    04/25/21 0937 04/26/21 0237  CHOL 186 163  HDL 33* 31*  LDLCALC UNABLE TO CALCULATE IF TRIGLYCERIDE OVER 400 mg/dL 85  TRIG 406* 234*  CHOLHDL 5.6 5.3  LDLDIRECT 127.1*  --    Thyroid  Function Tests Recent Labs    04/25/21 1223  TSH 2.122   _____________  CARDIAC CATHETERIZATION  Result Date: 04/25/2021   Prox RCA lesion is 100% stenosed.   A drug-eluting stent was successfully placed using a STENT ONYX FRONTIER 3.5X15.   Post intervention, there is a 0% residual stenosis. 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 Ventricular fibrillation x 1 post reperfusion with successful DCCV Plan: DAPT for one year. Continue IV amiodarone overnight. Assess LV function with Echo.   ECHOCARDIOGRAM COMPLETE  Result Date: 04/25/2021    ECHOCARDIOGRAM REPORT   Patient Name:   Barry Mccoy Date of Exam: 04/25/2021 Medical Rec #:  [id number]  Height:       74.0 in Accession #:    [id number] Weight:       215.6 lb Date of Birth:  [birth year]  BSA:          2.244 m Patient Age:    70 years   BP:           124/73 mmHg Patient Gender: M          HR:           89 bpm. Exam Location:  Inpatient Procedure: 2D Echo, Cardiac Doppler and Color Doppler Indications:    STEMI  History:        Patient has prior history of Echocardiogram examinations, most                 recent 10/11/2016. Previous Myocardial Infarction; Risk                 Factors:Former Smoker and Diabetes. Left heart cath.  Sonographer:    Luisa Hart RDCS Referring Phys: [id number] PETER M Martinique  Sonographer Comments: Suboptimal apical window and suboptimal parasternal window. Image acquisition challenging due to patient body habitus and Image acquisition challenging due to respiratory motion. IMPRESSIONS  1. Septal and inferior wall hyopkinesis . Left ventricular ejection fraction, by estimation, is 35 to 40%. The left ventricle has moderately decreased function. The left ventricle demonstrates regional wall motion abnormalities (see scoring diagram/findings for description). The left ventricular internal cavity size was mildly dilated. Left ventricular diastolic parameters are consistent with Grade I diastolic dysfunction (impaired relaxation).  2. Right ventricular systolic function is mildly reduced. The right ventricular size is mildly enlarged. There is normal pulmonary artery systolic pressure.  3. The mitral valve is normal in structure. Mild mitral valve regurgitation. No evidence of mitral stenosis.  4. The aortic valve is tricuspid. There is mild calcification of the aortic valve. Aortic valve regurgitation is not visualized. Mild aortic valve sclerosis is present, with no evidence of aortic valve stenosis.  5. The inferior vena cava is normal in size with greater than 50% respiratory variability, suggesting right atrial pressure of 3 mmHg. FINDINGS  Left Ventricle: Septal and inferior wall hyopkinesis. Left ventricular ejection fraction, by estimation, is 35 to 40%. The left ventricle has moderately decreased function. The left ventricle demonstrates regional wall motion abnormalities. The left ventricular internal cavity size was mildly dilated. There is no left ventricular hypertrophy. Left ventricular diastolic parameters are consistent with Grade I diastolic dysfunction (impaired relaxation). Right Ventricle: The right ventricular size is mildly enlarged. No increase in right ventricular wall thickness. Right ventricular systolic function is  mildly reduced. There is normal pulmonary artery systolic pressure. The tricuspid regurgitant velocity  is 1.57 m/s, and with an assumed right atrial  pressure of 3 mmHg, the estimated right ventricular systolic pressure is 0000000 mmHg. Left Atrium: Left atrial size was normal in size. Right Atrium: Right atrial size was normal in size. Pericardium: There is no evidence of pericardial effusion. Mitral Valve: The mitral valve is normal in structure. Mild mitral valve regurgitation. No evidence of mitral valve stenosis. Tricuspid Valve: The tricuspid valve is normal in structure. Tricuspid valve regurgitation is not demonstrated. No evidence of tricuspid stenosis. Aortic Valve: The aortic valve is tricuspid. There is mild calcification of the aortic valve. Aortic valve regurgitation is not visualized. Mild aortic valve sclerosis is present, with no evidence of aortic valve stenosis. Aortic valve mean gradient measures 2.0 mmHg. Aortic valve peak gradient measures 3.6 mmHg. Aortic valve area, by VTI measures 1.68 cm. Pulmonic Valve: The pulmonic valve was normal in structure. Pulmonic valve regurgitation is not visualized. No evidence of pulmonic stenosis. Aorta: The aortic root is normal in size and structure. Venous: The inferior vena cava is normal in size with greater than 50% respiratory variability, suggesting right atrial pressure of 3 mmHg. IAS/Shunts: No atrial level shunt detected by color flow Doppler.  LEFT VENTRICLE PLAX 2D LVIDd:         4.90 cm     Diastology LVIDs:         4.40 cm     LV e' medial:    4.79 cm/s LV PW:         1.00 cm     LV E/e' medial:  15.9 LV IVS:        1.00 cm     LV e' lateral:   6.64 cm/s LVOT diam:     2.30 cm     LV E/e' lateral: 11.4 LV SV:         38 LV SV Index:   17 LVOT Area:     4.15 cm  LV Volumes (MOD) LV vol d, MOD A2C: 75.1 ml LV vol d, MOD A4C: 63.7 ml LV vol s, MOD A2C: 58.9 ml LV vol s, MOD A4C: 61.6 ml LV SV MOD A2C:     16.2 ml LV SV MOD A4C:     63.7 ml LV SV MOD  BP:      10.8 ml RIGHT VENTRICLE RV Basal diam:  3.70 cm RV Mid diam:    2.00 cm LEFT ATRIUM             Index       RIGHT ATRIUM           Index LA diam:        3.40 cm 1.52 cm/m  RA Area:     14.60 cm LA Vol (A2C):   42.5 ml 18.94 ml/m RA Volume:   33.60 ml  14.97 ml/m LA Vol (A4C):   63.5 ml 28.30 ml/m LA Biplane Vol: 52.9 ml 23.57 ml/m  AORTIC VALVE                   PULMONIC VALVE AV Area (Vmax):    2.64 cm    PV Vmax:       0.82 m/s AV Area (Vmean):   2.47 cm    PV Vmean:      56.300 cm/s AV Area (VTI):     1.68 cm    PV VTI:        0.164 m AV Vmax:           95.40 cm/s  PV Peak grad:  2.7  mmHg AV Vmean:          62.800 cm/s PV Mean grad:  1.5 mmHg AV VTI:            0.225 m AV Peak Grad:      3.6 mmHg AV Mean Grad:      2.0 mmHg LVOT Vmax:         60.60 cm/s LVOT Vmean:        37.300 cm/s LVOT VTI:          0.091 m LVOT/AV VTI ratio: 0.40  AORTA Ao Root diam: 3.10 cm Ao Asc diam:  3.20 cm MITRAL VALVE                TRICUSPID VALVE MV Area (PHT): 5.04 cm     TR Peak grad:   9.9 mmHg MV Decel Time: 151 msec     TR Vmax:        157.00 cm/s MV E velocity: 75.97 cm/s MV A velocity: 122.33 cm/s  SHUNTS MV E/A ratio:  0.62         Systemic VTI:  0.09 m                             Systemic Diam: 2.30 cm Jenkins Rouge MD Electronically signed by Jenkins Rouge MD Signature Date/Time: 04/25/2021/2:08:00 PM    Final    Disposition   Pt is being discharged home today in good condition.  Follow-up Plans & Appointments     Follow-up Information     [facility]. Go to.   Specialty: Cardiology Why: Wednesday, August 24 at 3pm for [facility] appt within [place]. Come to entrance C, use free valet parking. Bring all your medications with you. Contact information: [address] [id number] [place], [place] Follow up.   Specialty: [place] Why: Home Health  Physical Therapy and RN-agency will call to arrange visit Contact information: [address] [place] [address] [phone]         Deberah Pelton, NP Follow up on 05/19/2021.   Specialty: Cardiology Why: '@10'$ :15am for hospital follow up with Dr. Doug Sou PA/NP Please arrive 15 minutes early Contact information: [address] [place] [address] [phone]                Discharge Instructions     Amb Referral to Cardiac Rehabilitation   Complete by: As directed    Will send Cardiac Rehab Phase 2 order to [place]   Diagnosis:  Coronary Stents STEMI     After initial evaluation and assessments completed: Virtual Based Care may be provided alone or in conjunction with Phase 2 Cardiac Rehab based on patient barriers.: Yes   Diet - low sodium heart healthy   Complete by: As directed    Discharge instructions   Complete by: As directed    No driving for 2 weeks. No lifting over 10 lbs for 4 weeks. No sexual activity for 4 weeks. You may not return to work until cleared by your cardiologist. Keep procedure site clean & dry. If you notice increased pain, swelling, bleeding or pus, call/return!  You may shower, but no soaking baths/hot tubs/pools for 1 week.   Increase activity slowly   Complete by: As directed  Discharge Medications   Allergies as of 04/27/2021       Reactions   Shellfish Allergy Other (See Comments)   Unknown         Medication List     STOP taking these medications    baclofen 20 MG tablet Commonly known as: LIORESAL   metoprolol tartrate 25 MG tablet Commonly known as: LOPRESSOR   polyethylene glycol 17 g packet Commonly known as: MIRALAX / GLYCOLAX       TAKE these medications    acetaminophen 325 MG tablet Commonly known as: TYLENOL Take 2 tablets (650 mg total) by mouth every 6 (six) hours as needed for mild pain, moderate pain or headache (or Fever >/= 101).   aspirin 81 MG EC  tablet Take 1 tablet (81 mg total) by mouth daily. Swallow whole. Start taking on: April 28, 2021   atorvastatin 80 MG tablet Commonly known as: LIPITOR Take 1 tablet (80 mg total) by mouth daily. Start taking on: April 28, 2021   cyanocobalamin 1000 MCG tablet Take 1 tablet (1,000 mcg total) by mouth daily.   fenofibrate 160 MG tablet Take 1 tablet (160 mg total) by mouth daily. Start taking on: August 19, 123456   folic acid 1 MG tablet Commonly known as: FOLVITE Take 1 tablet (1 mg total) by mouth daily.   gabapentin 300 MG capsule Commonly known as: NEURONTIN Take 300 mg by mouth 3 (three) times daily.   iron polysaccharides 150 MG capsule Commonly known as: NIFEREX Take 1 capsule (150 mg total) by mouth 2 (two) times daily.   lidocaine 5 % Commonly known as: LIDODERM Place 1 patch onto the skin every 12 (twelve) hours as needed (pain).   Lumify 0.025 % Soln Generic drug: Brimonidine Tartrate Place 1 drop into both eyes daily as needed (redness).   metFORMIN 500 MG tablet Commonly known as: Glucophage Take 1 tablet (500 mg total) by mouth 2 (two) times daily with a meal.   metoprolol succinate 25 MG 24 hr tablet Commonly known as: TOPROL-XL Take 1 tablet (25 mg total) by mouth daily. Start taking on: April 28, 2021   multivitamin with minerals Tabs tablet Take 1 tablet by mouth at bedtime.   nitroGLYCERIN 0.4 MG SL tablet Commonly known as: NITROSTAT Place 1 tablet (0.4 mg total) under the tongue every 5 (five) minutes x 3 doses as needed for chest pain.   oxybutynin 5 MG 24 hr tablet Commonly known as: DITROPAN-XL Take 1 tablet by mouth daily.   pantoprazole 40 MG tablet Commonly known as: PROTONIX Take 1 tablet (40 mg total) by mouth daily. What changed: when to take this   QUEtiapine 50 MG tablet Commonly known as: SEROQUEL TAKE 1 TABLET (50 MG TOTAL) BY MOUTH AT BEDTIME.   sertraline 50 MG tablet Commonly known as: ZOLOFT TAKE 1 TABLET BY  MOUTH AT BEDTIME   tamsulosin 0.4 MG Caps capsule Commonly known as: FLOMAX TAKE ONE CAPSULE BY MOUTH AT BEDTIME   thiamine 100 MG tablet Take 1 tablet (100 mg total) by mouth daily. For 4days then down to '100mg'$  daily What changed:  when to take this additional instructions   ticagrelor 90 MG Tabs tablet Commonly known as: BRILINTA Take 1 tablet (90 mg total) by mouth 2 (two) times daily.   traZODone 100 MG tablet Commonly known as: DESYREL Take 1 tablet (100 mg total) by mouth at bedtime.   Vitamin D-3 125 MCG (5000 UT) Tabs Take 50,000 Units by mouth daily.  Outstanding Labs/Studies   Consider OP f/u labs 6-8 weeks given statin initiation this admission.   Duration of Discharge Encounter   Greater than 30 minutes including physician time.  SignedCrista Luria Page, PA 04/27/2021, 4:43 PM  I have examined the patient and reviewed assessment and plan and discussed with patient.  Agree with above as stated.      CAD/inferior STEMI: Continue aggressive secondary prevention.  Dual antiplatelet therapy rationale explained to patient.  We will plan for 30 days Brilinta followed by clopidogrel for cost savings.  Low blood pressure limits our medical therapy for his LV dysfunction.  He is on low-dose beta-blocker.  We will hold off on starting losartan or Entresto which were both discussed.  Hopefully, Delene Loll can be started as an outpatient if his blood pressure increases when he is back to his normal routine.   Hyperlipidemia: Continue high-dose statin.  Fibrate also added for high triglycerides.   Unsteadiness: HHPT recommended.   DM: Healthy diet,  he has not tolerated any medications in the past.  Retrying low dose metformin.   Avoid all tobacco products.  OK to discharge patient with outpatient followup    Larae Grooms

## 2021-04-27 NOTE — Progress Notes (Addendum)
Heart Failure Navigation Team Progress Note  PCP: [name], PA-C Primary Cardiologist: N/A Martinique, MD Admitted from: home/hotel  Past Medical History:  Diagnosis Date   Anxiety    BPH (benign prostatic hyperplasia)    Cervical spine disease    Chronic kidney disease    kidney stones   Depression    Disorder of lumbar spine    ETOH abuse    GI bleed    Muscle spasm     Social History   Socioeconomic History   Marital status: Widowed    Spouse name: Not on file   Number of children: 2   Years of education: Not on file   Highest education level: Some college, no degree  Occupational History   Not on file  Tobacco Use   Smoking status: Every Day    Packs/day: 1.00    Years: 50.00    Pack years: 50.00    Types: Cigarettes   Smokeless tobacco: Never   Tobacco comments:    attempting to quit- down to 8 a day   Vaping Use   Vaping Use: Never used  Substance and Sexual Activity   Alcohol use: No    Comment: 2 drinks a month   Drug use: Not Currently    Types: Marijuana   Sexual activity: Yes  Other Topics Concern   Not on file  Social History Narrative   ** Merged History Encounter **       Social Determinants of Health   Financial Resource Strain: High Risk   Difficulty of Paying Living Expenses: Very hard  Food Insecurity: Food Insecurity Present   Worried About Charity fundraiser in the Last Year: Sometimes true   Ran Out of Food in the Last Year: Never true  Transportation Needs: No Transportation Needs   Lack of Transportation (Medical): No   Lack of Transportation (Non-Medical): No  Physical Activity: Not on file  Stress: Not on file  Social Connections: Socially Isolated   Frequency of Communication with Friends and Family: Once a week   Frequency of Social Gatherings with Friends and Family: Never   Attends Religious Services: Never   Marine scientist or Organizations: No   Attends Archivist Meetings: Never   Marital  Status: Widowed     Heart & Vascular Transition of Care Clinic follow-up: Scheduled for 05/03/2021 at 2pm.  Confirmed transportation.  Immediate social needs: Housing.  Mr. Gordin reported that he is staying at the [facility] [place] [address] at maybe $62 a night $350 a week and his car is parked there with his belongings. Mr. Slyman daughter Eustace Pen [phone] will be able to take him to the [facility], patients RN and Mr. Hornbeak spoke with his daughter, Eustace Pen over the phone about picking him up today for discharge and assisting him pay for a night at the hotel. Mr. Bibey reported that he receives $2159 a month and has a car payment and his car insurance and that he receives Physicist, medical and is working on finding an home for rent in [place] for maybe $800 a month. Mr. Naba reported that he can't get in his phone because the battery died and he doesn't have a Pensions consultant. CSW obtained the patient's signature for the rider waiver for [facility] just in case he needs a ride home at discharge. CSW emailed [facility] [employer] enrollment paperwork to [employer]. Mr. Opdyke reported that he is okay to go  back to the [facility] for the night and will see about the place for rent in [place] once he gets his phone charged.   Terrika Zuver, MSW, [place] Heart Failure Social Worker

## 2021-05-02 ENCOUNTER — Telehealth (HOSPITAL_COMMUNITY): Payer: Self-pay | Admitting: Student-PharmD

## 2021-05-02 ENCOUNTER — Other Ambulatory Visit (HOSPITAL_COMMUNITY): Payer: Self-pay

## 2021-05-02 ENCOUNTER — Telehealth (HOSPITAL_COMMUNITY): Payer: Self-pay

## 2021-05-02 NOTE — Telephone Encounter (Signed)
Transitions of Care Pharmacy   Call attempted for a pharmacy transitions of care follow-up. Unable to leave voicemail.  Call attempt #1. Will follow-up in 2-3 days.    

## 2021-05-02 NOTE — Progress Notes (Signed)
[facility]  PCP: Dena Billet Primary Cardiologist: Martinique, Peter  HPI:  Barry Mccoy is a 70 y.o.  male  with a PMH significant for HTN, HLD, T2DM, pancreatitis with pseudocyst, splenic abscess s/p splenectomy, remote tobacco smoking/alcohol abuse, CLL, recently diagnosed systolic CHF/ inferior STEMI  Patient developed chest pain associated with diaphoresis and shortness of breath he called EMS.  He was diagnosed with inferior STEMI received aspirin taken emergently to cardiac catheterization: Severe occlusive single-vessel disease of the RCA.  Successful PCI with DES to RCA.  He developed V. fib after reperfusion and had successful DCCV was placed on amiodarone overnight.  This was followed by echocardiogram which demonstrated septal and inferior wall hypokinesis, EF 35 to 40% mildly reduced RV function normal IVC size with greater than 50% respiratory variability.  During admission he was switched from tartrate to Toprol-XL 25 mg.  Further GDMT was limited by low blood pressures.    Has been feeling better since leaving the hospital.  Does not take BP at home.  Denies further chest pain, dyspnea.  Not exerting himself too strenuously.  Has been back and forth to [employer] and walks across the store without dyspnea.    ROS: All systems negative except as listed in HPI, PMH and Problem List.  SH:  Social History   Socioeconomic History   Marital status: Widowed    Spouse name: Not on file   Number of children: 2   Years of education: Not on file   Highest education level: Some college, no degree  Occupational History   Not on file  Tobacco Use   Smoking status: Every Day    Packs/day: 1.00    Years: 50.00    Pack years: 50.00    Types: Cigarettes   Smokeless tobacco: Never   Tobacco comments:    attempting to quit- down to 8 a day   Vaping Use   Vaping Use: Never used  Substance and Sexual Activity   Alcohol use: No    Comment: 2 drinks  a month   Drug use: Not Currently    Types: Marijuana   Sexual activity: Yes  Other Topics Concern   Not on file  Social History Narrative   ** Merged History Encounter **       Social Determinants of Health   Financial Resource Strain: High Risk   Difficulty of Paying Living Expenses: Very hard  Food Insecurity: Food Insecurity Present   Worried About Charity fundraiser in the Last Year: Sometimes true   Ran Out of Food in the Last Year: Never true  Transportation Needs: No Transportation Needs   Lack of Transportation (Medical): No   Lack of Transportation (Non-Medical): No  Physical Activity: Not on file  Stress: Not on file  Social Connections: Socially Isolated   Frequency of Communication with Friends and Family: Once a week   Frequency of Social Gatherings with Friends and Family: Never   Attends Religious Services: Never   Marine scientist or Organizations: No   Attends Archivist Meetings: Never   Marital Status: Widowed  Human resources officer Violence: Not on file    FH:  Family History  Problem Relation Age of Onset   Colon cancer Brother    Alcohol abuse Brother    Cancer Brother    Early death Brother    Prostate cancer Brother    Colon polyps Brother    Cancer Mother 44  colon cancer   Colon cancer Mother     Past Medical History:  Diagnosis Date   Anxiety    BPH (benign prostatic hyperplasia)    Cervical spine disease    Chronic kidney disease    kidney stones   Depression    Disorder of lumbar spine    ETOH abuse    GI bleed    Muscle spasm     Current Outpatient Medications  Medication Sig Dispense Refill   aspirin 81 MG EC tablet Take 1 tablet (81 mg total) by mouth daily. Swallow whole. 90 tablet 3   atorvastatin (LIPITOR) 80 MG tablet Take 1 tablet (80 mg total) by mouth daily. 30 tablet 11   Brimonidine Tartrate (LUMIFY) 0.025 % SOLN Place 1 drop into both eyes daily as needed (redness).     Cholecalciferol  (VITAMIN D-3) 5000 units TABS Take 50,000 Units by mouth daily.     fenofibrate 160 MG tablet Take 1 tablet (160 mg total) by mouth daily. 30 tablet 6   metFORMIN (GLUCOPHAGE) 500 MG tablet Take 1 tablet (500 mg total) by mouth 2 (two) times daily with a meal. 60 tablet 2   metoprolol succinate (TOPROL-XL) 25 MG 24 hr tablet Take 1 tablet (25 mg total) by mouth daily. 30 tablet 6   nitroGLYCERIN (NITROSTAT) 0.4 MG SL tablet Place 1 tablet (0.4 mg total) under the tongue every 5 (five) minutes x 3 doses as needed for chest pain. 25 tablet 12   oxybutynin (DITROPAN-XL) 5 MG 24 hr tablet Take 1 tablet by mouth daily.     pantoprazole (PROTONIX) 40 MG tablet Take 1 tablet (40 mg total) by mouth daily. 30 tablet 6   QUEtiapine (SEROQUEL) 50 MG tablet TAKE 1 TABLET (50 MG TOTAL) BY MOUTH AT BEDTIME. 30 tablet 0   sacubitril-valsartan (ENTRESTO) 24-26 MG Take 1 tablet by mouth 2 (two) times daily. 60 tablet 6   thiamine 100 MG tablet Take 1 tablet (100 mg total) by mouth daily. For 4days then down to '100mg'$  daily (Patient taking differently: Take 100 mg by mouth 3 (three) times daily.)     ticagrelor (BRILINTA) 90 MG TABS tablet Take 1 tablet (90 mg total) by mouth 2 (two) times daily. 60 tablet 11   vitamin B-12 1000 MCG tablet Take 1 tablet (1,000 mcg total) by mouth daily. 30 tablet 0   acetaminophen (TYLENOL) 325 MG tablet Take 2 tablets (650 mg total) by mouth every 6 (six) hours as needed for mild pain, moderate pain or headache (or Fever >/= 101). (Patient not taking: No sig reported)     folic acid (FOLVITE) 1 MG tablet Take 1 tablet (1 mg total) by mouth daily. (Patient not taking: No sig reported) 30 tablet 0   gabapentin (NEURONTIN) 300 MG capsule Take 300 mg by mouth 3 (three) times daily. (Patient not taking: Reported on 05/03/2021)     iron polysaccharides (NIFEREX) 150 MG capsule Take 1 capsule (150 mg total) by mouth 2 (two) times daily. (Patient not taking: Reported on 05/03/2021) 60 capsule 0    lidocaine (LIDODERM) 5 % Place 1 patch onto the skin every 12 (twelve) hours as needed (pain).  (Patient not taking: Reported on 05/03/2021)  1   Multiple Vitamin (MULTIVITAMIN WITH MINERALS) TABS tablet Take 1 tablet by mouth at bedtime. (Patient not taking: Reported on 05/03/2021)     sertraline (ZOLOFT) 50 MG tablet TAKE 1 TABLET BY MOUTH AT BEDTIME (Patient not taking: No sig reported) 30  tablet 0   tamsulosin (FLOMAX) 0.4 MG CAPS capsule TAKE ONE CAPSULE BY MOUTH AT BEDTIME (Patient not taking: Reported on 05/03/2021) 30 capsule 0   traZODone (DESYREL) 100 MG tablet Take 1 tablet (100 mg total) by mouth at bedtime. (Patient not taking: No sig reported)     No current facility-administered medications for this encounter.    Vitals:   05/03/21 1406  BP: (!) 152/98  Pulse: 82  SpO2: 98%  Weight: 97.5 kg (215 lb)    PHYSICAL EXAM: Cardiac: JVD flat, normal rate and rhythm, clear s1 and s2, no murmurs, rubs or gallops, no LE edema Pulmonary: CTAB, not in distress Abdominal: non distended abdomen, soft and nontender Psych: Alert, conversant, in good spirits   ASSESSMENT & PLAN: Chronic Systolic CHF ICM -[facility] A999333 Severe occlusive single-vessel disease of the RCA.  Successful PCI with DES to RCA. -EF 35 to 40% mildly reduced RV function normal IVC size with greater than 50% respiratory variability -NYHA Class II, euvolemic on exam -Continue Toprol XL 25 daily -Cautiously initiate Entresto 24/26, gave pt an automatic bp cuff and instructed him to assess for hypotension and the symptoms associated with it.   CAD -[facility] 04/2021 Severe occlusive single-vessel disease of the RCA.  Successful PCI with DES to RCA.  Tobacco use disorder: -Smoked about 4 cigarettes since discharge, understands the importance of cessation but declines cessation aids today  T2DM: -eventual SGLT2i therapy   Follow up w/[facility] cardiology

## 2021-05-02 NOTE — Telephone Encounter (Signed)
Call attempted to confirm HV TOC appt 8/24 @ [place] 3 numbers listed in chart, no answer, no vm set up.   Pricilla Holm, MSN, RN Heart Failure Nurse Navigator [phone]

## 2021-05-03 ENCOUNTER — Ambulatory Visit (HOSPITAL_COMMUNITY)
Admit: 2021-05-03 | Discharge: 2021-05-03 | Disposition: A | Discharge status: Home / Self Care | Payer: Medicare HMO | Source: Ambulatory Visit | Attending: Internal Medicine | Admitting: Internal Medicine

## 2021-05-03 ENCOUNTER — Telehealth: Payer: Self-pay | Admitting: Cardiology

## 2021-05-03 ENCOUNTER — Encounter (HOSPITAL_COMMUNITY): Payer: Self-pay

## 2021-05-03 ENCOUNTER — Other Ambulatory Visit: Payer: Self-pay

## 2021-05-03 ENCOUNTER — Telehealth (HOSPITAL_COMMUNITY): Payer: Self-pay | Admitting: Pharmacist

## 2021-05-03 VITALS — BP 152/98 | HR 82 | Wt 215.0 lb

## 2021-05-03 DIAGNOSIS — N189 Chronic kidney disease, unspecified: Secondary | ICD-10-CM | POA: Diagnosis not present

## 2021-05-03 DIAGNOSIS — E785 Hyperlipidemia, unspecified: Secondary | ICD-10-CM | POA: Diagnosis not present

## 2021-05-03 DIAGNOSIS — I5022 Chronic systolic (congestive) heart failure: Secondary | ICD-10-CM | POA: Diagnosis not present

## 2021-05-03 DIAGNOSIS — Z5941 Food insecurity: Secondary | ICD-10-CM | POA: Diagnosis not present

## 2021-05-03 DIAGNOSIS — I2119 ST elevation (STEMI) myocardial infarction involving other coronary artery of inferior wall: Secondary | ICD-10-CM | POA: Diagnosis not present

## 2021-05-03 DIAGNOSIS — E119 Type 2 diabetes mellitus without complications: Secondary | ICD-10-CM | POA: Diagnosis not present

## 2021-05-03 DIAGNOSIS — Z7982 Long term (current) use of aspirin: Secondary | ICD-10-CM | POA: Diagnosis not present

## 2021-05-03 DIAGNOSIS — Z9081 Acquired absence of spleen: Secondary | ICD-10-CM | POA: Diagnosis not present

## 2021-05-03 DIAGNOSIS — E1122 Type 2 diabetes mellitus with diabetic chronic kidney disease: Secondary | ICD-10-CM | POA: Diagnosis not present

## 2021-05-03 DIAGNOSIS — Z596 Low income: Secondary | ICD-10-CM | POA: Insufficient documentation

## 2021-05-03 DIAGNOSIS — I130 Hypertensive heart and chronic kidney disease with heart failure and stage 1 through stage 4 chronic kidney disease, or unspecified chronic kidney disease: Secondary | ICD-10-CM | POA: Diagnosis present

## 2021-05-03 DIAGNOSIS — I2510 Atherosclerotic heart disease of native coronary artery without angina pectoris: Secondary | ICD-10-CM | POA: Diagnosis not present

## 2021-05-03 DIAGNOSIS — Z720 Tobacco use: Secondary | ICD-10-CM | POA: Diagnosis not present

## 2021-05-03 DIAGNOSIS — Z8719 Personal history of other diseases of the digestive system: Secondary | ICD-10-CM | POA: Diagnosis not present

## 2021-05-03 DIAGNOSIS — Z955 Presence of coronary angioplasty implant and graft: Secondary | ICD-10-CM | POA: Insufficient documentation

## 2021-05-03 DIAGNOSIS — F17210 Nicotine dependence, cigarettes, uncomplicated: Secondary | ICD-10-CM | POA: Diagnosis not present

## 2021-05-03 DIAGNOSIS — Z79899 Other long term (current) drug therapy: Secondary | ICD-10-CM | POA: Insufficient documentation

## 2021-05-03 DIAGNOSIS — Z7984 Long term (current) use of oral hypoglycemic drugs: Secondary | ICD-10-CM | POA: Insufficient documentation

## 2021-05-03 LAB — BASIC METABOLIC PANEL
Anion gap: 8 (ref 5–15)
BUN: 12 mg/dL (ref 8–23)
CO2: 25 mmol/L (ref 22–32)
Calcium: 9.6 mg/dL (ref 8.9–10.3)
Chloride: 104 mmol/L (ref 98–111)
Creatinine, Ser: 1.01 mg/dL (ref 0.61–1.24)
GFR, Estimated: >60 mL/min (ref >60)
Glucose, Bld: 206 mg/dL — ABNORMAL HIGH (ref 70–99)
Potassium: 4.1 mmol/L (ref 3.5–5.1)
Sodium: 137 mmol/L (ref 135–145)

## 2021-05-03 MED ORDER — ENTRESTO 24-26 MG PO TABS: 1.0000 | ORAL_TABLET | Freq: Two times a day (BID) | ORAL | 6 refills | Status: DC

## 2021-05-03 NOTE — Telephone Encounter (Signed)
Unable to reach patient or leave a message.

## 2021-05-03 NOTE — Telephone Encounter (Signed)
Cara from [employer] is calling in regards to pt needing [employer]. Pt currently resides in the [facility]. Pt has an appt today at 2pm. Moshe Salisbury states that pt is hesitant to having Home Health and would like for someone to speak with him about the services and to please reach out to her if the services are still needed at [phone] cell# or [phone]-agency number

## 2021-05-03 NOTE — Patient Instructions (Addendum)
Great to see you! Please begin Entresto 24/26 mg twice daily. Labwork today --we will only call you with abnormal results

## 2021-05-03 NOTE — Progress Notes (Signed)
[facility] Heart Failure Pharmacist Encounter  The patient was approved for an Entresto grant through the [employer].  Member ID: [id number] Group ID: [id number] RxBin ID: [id number] PCN: [id number]  Eligibility End Date: 05/02/2022 Assistance Amount: $1,000.00   Phone Number: [phone].  Kerby Nora, PharmD, BCPS Heart Failure Transitions of Care Clinic Pharmacist [phone]

## 2021-05-03 NOTE — Progress Notes (Signed)
Heart and Vascular Care Navigation  05/03/2021  Hilton Sofield 1950/10/18 [id number]  Reason for Referral: Patient seen in HF TOC.   Engaged with patient face to face for initial visit for Heart and Vascular Care Coordination.                                                                                                   Assessment:   Patient is a 70 yo male who is currently residing in a motel. Patient reports he was previously living in [place] and moved to [place] to be closer to his daughters. Patient reports his wife dies in 12/22/2015 suddenly from a stroke. He receives Fish farm manager retirement benefits with a monthly income of $2,159 and has [employer] from [place].  He is looking for permament housing but has had some challenges with the lack of housing options.                                HRT/VAS Care Coordination     Patients Home Cardiology Office --  [facility]   Outpatient Care Team Social Worker   Social Worker Name: Raquel Sarna, [place] [phone]   Living arrangements for the past 2 months Hotel/Motel  [place]   Lives with: Self   Patient Current Insurance Coverage Managed Medicare   Patient Has Concern With Paying Medical Bills No   Does Patient Have Prescription Coverage? Yes   Home Assistive Devices/Equipment Scales; Blood pressure cuff   DME [place]       Social History:                                                                             SDOH Screenings   Alcohol Screen: Low Risk    Last Alcohol Screening Score (AUDIT): 1  Depression (PHQ2-9): Not on file  Financial Resource Strain: High Risk   Difficulty of Paying Living Expenses: Very hard  Food Insecurity: Food Insecurity Present   Worried About Charity fundraiser in the Last Year: Sometimes true   Ran Out of Food in the Last Year: Never true  Housing: High Risk   Last Housing Risk Score: 2  Physical Activity: Not on file   Social Connections: Socially Isolated   Frequency of Communication with Friends and Family: Once a week   Frequency of Social Gatherings with Friends and Family: Never   Attends Religious Services: Never   Marine scientist or Organizations: No   Attends Archivist Meetings: Never   Marital Status: Widowed  Stress: Not on file  Tobacco Use: High Risk   Smoking Tobacco Use: Every Day   Smokeless Tobacco Use: Never  Transportation Needs: No Data processing manager (Medical): No   Lack of Transportation (Non-Medical): No    SDOH Interventions: Financial Resources:    N/a  Food Insecurity:   N/a  Housing Insecurity:   Provided patient with a housing list from [employer].  Transportation:    N/a   Follow-up plan:  CSW provided patient with multiple options for apartments from [employer] in the [place] area as well as [place]. Patient also provided contact information for [employer] [place] to answer questions about [employer]. Patient verbalizes understanding and will follow up and retrun call to CSW if needed. Raquel Sarna, [place], [place]

## 2021-05-04 ENCOUNTER — Telehealth (HOSPITAL_COMMUNITY): Payer: Self-pay

## 2021-05-04 NOTE — Telephone Encounter (Signed)
Per phase I cardiac rehab, fax cardiac rehab referral to [facility]. °

## 2021-05-08 ENCOUNTER — Telehealth (HOSPITAL_COMMUNITY): Payer: Self-pay | Admitting: Pharmacist

## 2021-05-08 NOTE — Telephone Encounter (Signed)
Unable to reach patient.  Third and final attempt.

## 2021-05-18 NOTE — Progress Notes (Deleted)
Cardiology Clinic Note   Patient Name: Barry Mccoy Date of Encounter: 05/18/2021  Primary Care Provider:  [name], PA-C Primary Cardiologist:  Peter Martinique, MD  Patient Profile    Barry Mccoy 70 year old male presents the clinic today status post STEMI.  Past Medical History    Past Medical History:  Diagnosis Date   Anxiety    BPH (benign prostatic hyperplasia)    Cervical spine disease    Chronic kidney disease    kidney stones   Depression    Disorder of lumbar spine    ETOH abuse    GI bleed    Muscle spasm    Past Surgical History:  Procedure Laterality Date   COLONOSCOPY WITH PROPOFOL N/A 11/08/2016   Procedure: COLONOSCOPY WITH PROPOFOL;  Surgeon: Wilford Corner, MD;  Location: [facility];  Service: Endoscopy;  Laterality: N/A;   CORONARY STENT INTERVENTION N/A 04/25/2021   Procedure: CORONARY STENT INTERVENTION;  Surgeon: Martinique, Peter M, MD;  Location: [facility];  Service: Cardiovascular;  Laterality: N/A;  RCA   ELBOW SURGERY     as child   ELBOW SURGERY     ENTEROSCOPY N/A 11/19/2016   Procedure: ENTEROSCOPY;  Surgeon: Wonda Horner, MD;  Location: [facility];  Service: Endoscopy;  Laterality: N/A;   ESOPHAGOGASTRODUODENOSCOPY N/A 10/04/2016   Procedure: ESOPHAGOGASTRODUODENOSCOPY (EGD);  Surgeon: Carol Ada, MD;  Location: [facility];  Service: Gastroenterology;  Laterality: N/A;   GIVENS CAPSULE STUDY N/A 11/09/2016   Procedure: GIVENS CAPSULE STUDY;  Surgeon: Wilford Corner, MD;  Location: [facility];  Service: Endoscopy;  Laterality: N/A;   LEFT HEART CATH AND CORONARY ANGIOGRAPHY N/A 04/25/2021   Procedure: LEFT HEART CATH AND CORONARY ANGIOGRAPHY;  Surgeon: Martinique, Peter M, MD;  Location: [facility];  Service: Cardiovascular;  Laterality: N/A;    Allergies  Allergies  Allergen Reactions   Shellfish Allergy Other (See Comments)    Unknown     History of Present Illness    Barry Mccoy has a PMH of hyperlipidemia,  hypertension, type 2 diabetes, pancreatitis, splenic abscess status post splenectomy, remote alcohol and tobacco abuse, CLL, systolic CHF and inferior STEMI (04/25/2021).  He contacted EMS after developing chest pain, diaphoresis, and shortness of breath.  He was diagnosed with inferior STEMI.  He received ASA and was taken emergently to the cardiac Cath Lab.  His cardiac catheterization showed severe RCA stenosis.  He received PCI with DES x1.  He was noted to have A. fib after reperfusion and had successful DCCV.  He was placed on amiodarone overnight.  His echocardiogram post cath showed an LVEF of 35-40%, mildly reduced RV function.  His metoprolol tartrate was switched to metoprolol succinate 25 mg.  He was noted to have soft blood pressures at that time.  He followed up with Dr. Shan Mccoy 05/03/2021.  He reported he was feeling well since his hospital discharge.  He had not been taking his blood pressure at home.  He denied shortness of breath and chest discomfort.  He had not been overexerting himself with his physical activity.  He was walking in stores and denied shortness of breath with these activities.  He was started on Entresto 24-26 twice daily.  He presents to clinic today for follow-up evaluation states***  *** denies chest pain, shortness of breath, lower extremity edema, fatigue, palpitations, melena, hematuria, hemoptysis, diaphoresis, weakness, presyncope, syncope, orthopnea, and PND.    Home Medications    Prior to Admission medications  Medication Sig Start Date End Date Taking? Authorizing Provider  acetaminophen (TYLENOL) 325 MG tablet Take 2 tablets (650 mg total) by mouth every 6 (six) hours as needed for mild pain, moderate pain or headache (or Fever >/= 101). Patient not taking: No sig reported 10/27/16   Murlean Iba, MD  aspirin 81 MG EC tablet Take 1 tablet (81 mg total) by mouth daily. Swallow whole. 04/28/21   Bhagat, Crista Luria, PA  atorvastatin (LIPITOR) 80  MG tablet Take 1 tablet (80 mg total) by mouth daily. 04/28/21   Bhagat, Crista Luria, PA  Brimonidine Tartrate (LUMIFY) 0.025 % SOLN Place 1 drop into both eyes daily as needed (redness).    [provider]  Cholecalciferol (VITAMIN D-3) 5000 units TABS Take 50,000 Units by mouth daily.    [provider]  fenofibrate 160 MG tablet Take 1 tablet (160 mg total) by mouth daily. 04/28/21   Bhagat, Crista Luria, PA  folic acid (FOLVITE) 1 MG tablet Take 1 tablet (1 mg total) by mouth daily. Patient not taking: No sig reported 10/28/16   Irwin Brakeman L, MD  gabapentin (NEURONTIN) 300 MG capsule Take 300 mg by mouth 3 (three) times daily. Patient not taking: Reported on 05/03/2021    [provider]  iron polysaccharides (NIFEREX) 150 MG capsule Take 1 capsule (150 mg total) by mouth 2 (two) times daily. Patient not taking: Reported on 05/03/2021 11/21/16   Elgergawy, Silver Huguenin, MD  lidocaine (LIDODERM) 5 % Place 1 patch onto the skin every 12 (twelve) hours as needed (pain).  Patient not taking: Reported on 05/03/2021 09/12/16   [provider]  metFORMIN (GLUCOPHAGE) 500 MG tablet Take 1 tablet (500 mg total) by mouth 2 (two) times daily with a meal. 04/27/21 04/27/22  Bhagat, Crista Luria, PA  metoprolol succinate (TOPROL-XL) 25 MG 24 hr tablet Take 1 tablet (25 mg total) by mouth daily. 04/28/21   Leanor Kail, PA  Multiple Vitamin (MULTIVITAMIN WITH MINERALS) TABS tablet Take 1 tablet by mouth at bedtime. Patient not taking: Reported on 05/03/2021    [provider]  nitroGLYCERIN (NITROSTAT) 0.4 MG SL tablet Place 1 tablet (0.4 mg total) under the tongue every 5 (five) minutes x 3 doses as needed for chest pain. 04/27/21   Bhagat, Crista Luria, PA  oxybutynin (DITROPAN-XL) 5 MG 24 hr tablet Take 1 tablet by mouth daily. 01/13/20   [provider]  pantoprazole (PROTONIX) 40 MG tablet Take 1 tablet (40 mg total) by mouth daily. 04/27/21   Bhagat,  Crista Luria, PA  QUEtiapine (SEROQUEL) 50 MG tablet TAKE 1 TABLET (50 MG TOTAL) BY MOUTH AT BEDTIME. 11/26/16   Dixon, Lonie Peak, PA-C  sacubitril-valsartan (ENTRESTO) 24-26 MG Take 1 tablet by mouth 2 (two) times daily. 05/03/21   Katherine Roan, MD  sertraline (ZOLOFT) 50 MG tablet TAKE 1 TABLET BY MOUTH AT BEDTIME Patient not taking: No sig reported 11/26/16   Dena Billet B, PA-C  tamsulosin (FLOMAX) 0.4 MG CAPS capsule TAKE ONE CAPSULE BY MOUTH AT BEDTIME Patient not taking: Reported on 05/03/2021 11/26/16   [name], PA-C  thiamine 100 MG tablet Take 1 tablet (100 mg total) by mouth daily. For 4days then down to '100mg'$  daily Patient taking differently: Take 100 mg by mouth 3 (three) times daily. 10/27/16   Johnson, Clanford L, MD  ticagrelor (BRILINTA) 90 MG TABS tablet Take 1 tablet (90 mg total) by mouth 2 (two) times daily. 04/27/21   Leanor Kail, PA  traZODone (DESYREL) 100 MG  tablet Take 1 tablet (100 mg total) by mouth at bedtime. Patient not taking: No sig reported 10/27/16   Murlean Iba, MD  vitamin B-12 1000 MCG tablet Take 1 tablet (1,000 mcg total) by mouth daily. 11/22/16   Elgergawy, Silver Huguenin, MD    Family History    Family History  Problem Relation Age of Onset   Colon cancer Brother    Alcohol abuse Brother    Cancer Brother    Early death Brother    Prostate cancer Brother    Colon polyps Brother    Cancer Mother 65       colon cancer   Colon cancer Mother    He indicated that his mother is deceased. He indicated that his father is deceased. He indicated that all of his three sisters are alive. He indicated that two of his three brothers are deceased.  Social History    Social History   Socioeconomic History   Marital status: Widowed    Spouse name: Not on file   Number of children: 2   Years of education: Not on file   Highest education level: Some college, no degree  Occupational History   Not on file  Tobacco Use   Smoking status: Every  Day    Packs/day: 1.00    Years: 50.00    Pack years: 50.00    Types: Cigarettes   Smokeless tobacco: Never   Tobacco comments:    attempting to quit- down to 8 a day   Vaping Use   Vaping Use: Never used  Substance and Sexual Activity   Alcohol use: No    Comment: 2 drinks a month   Drug use: Not Currently    Types: Marijuana   Sexual activity: Yes  Other Topics Concern   Not on file  Social History Narrative   ** Merged History Encounter **       Social Determinants of Health   Financial Resource Strain: High Risk   Difficulty of Paying Living Expenses: Very hard  Food Insecurity: Food Insecurity Present   Worried About Charity fundraiser in the Last Year: Sometimes true   Ran Out of Food in the Last Year: Never true  Transportation Needs: No Transportation Needs   Lack of Transportation (Medical): No   Lack of Transportation (Non-Medical): No  Physical Activity: Not on file  Stress: Not on file  Social Connections: Socially Isolated   Frequency of Communication with Friends and Family: Once a week   Frequency of Social Gatherings with Friends and Family: Never   Attends Religious Services: Never   Marine scientist or Organizations: No   Attends Archivist Meetings: Never   Marital Status: Widowed  Intimate Partner Violence: Not on file     Review of Systems    General:  No chills, fever, night sweats or weight changes.  Cardiovascular:  No chest pain, dyspnea on exertion, edema, orthopnea, palpitations, paroxysmal nocturnal dyspnea. Dermatological: No rash, lesions/masses Respiratory: No cough, dyspnea Urologic: No hematuria, dysuria Abdominal:   No nausea, vomiting, diarrhea, bright red blood per rectum, melena, or hematemesis Neurologic:  No visual changes, wkns, changes in mental status. All other systems reviewed and are otherwise negative except as noted above.  Physical Exam    VS:  There were no vitals taken for this visit. , BMI  There is no height or weight on file to calculate BMI. GEN: Well nourished, well developed, in no acute distress. HEENT: normal.  Neck: Supple, no JVD, carotid bruits, or masses. Cardiac: RRR, no murmurs, rubs, or gallops. No clubbing, cyanosis, edema.  Radials/DP/PT 2+ and equal bilaterally.  Respiratory:  Respirations regular and unlabored, clear to auscultation bilaterally. GI: Soft, nontender, nondistended, BS + x 4. MS: no deformity or atrophy. Skin: warm and dry, no rash. Neuro:  Strength and sensation are intact. Psych: Normal affect.  Accessory Clinical Findings    Recent Labs: 04/25/2021: ALT 43; Magnesium 2.0; TSH 2.122 04/26/2021: Hemoglobin 14.4; Platelets 306 05/03/2021: BUN 12; Creatinine, Ser 1.01; Potassium 4.1; Sodium 137   Recent Lipid Panel    Component Value Date/Time   CHOL 163 04/26/2021 0237   TRIG 234 (H) 04/26/2021 0237   HDL 31 (L) 04/26/2021 0237   CHOLHDL 5.3 04/26/2021 0237   VLDL 47 (H) 04/26/2021 0237   LDLCALC 85 04/26/2021 0237   LDLDIRECT 127.1 (H) 04/25/2021 0937    ECG personally reviewed by me today- *** - No acute changes  Echocardiogram 04/25/2021  IMPRESSIONS     1. Septal and inferior wall hyopkinesis . Left ventricular ejection  fraction, by estimation, is 35 to 40%. The left ventricle has moderately  decreased function. The left ventricle demonstrates regional wall motion  abnormalities (see scoring  diagram/findings for description). The left ventricular internal cavity  size was mildly dilated. Left ventricular diastolic parameters are  consistent with Grade I diastolic dysfunction (impaired relaxation).   2. Right ventricular systolic function is mildly reduced. The right  ventricular size is mildly enlarged. There is normal pulmonary artery  systolic pressure.   3. The mitral valve is normal in structure. Mild mitral valve  regurgitation. No evidence of mitral stenosis.   4. The aortic valve is tricuspid. There is mild  calcification of the  aortic valve. Aortic valve regurgitation is not visualized. Mild aortic  valve sclerosis is present, with no evidence of aortic valve stenosis.   5. The inferior vena cava is normal in size with greater than 50%  respiratory variability, suggesting right atrial pressure of 3 mmHg.  Cardiac catheterization 04/25/2026   Prox RCA lesion is 100% stenosed.   A drug-eluting stent was successfully placed using a STENT ONYX FRONTIER 3.5X15.   Post intervention, there is a 0% residual stenosis.   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  Ventricular fibrillation x 1 post reperfusion with successful DCCV   Plan: DAPT for one year. Continue IV amiodarone overnight. Assess LV function with Echo.   Diagnostic Dominance: Right Intervention   Assessment & Plan   1.  Coronary artery disease-no further episodes of arm neck back or chest discomfort.  Status post STEMI.  Underwent cardiac catheterization 04/25/2026 with PCI and DES x1 to his RCA. Continue Brilinta, aspirin, metoprolol, nitroglycerin Heart healthy low-sodium diet-salty 6 given Increase physical activity as tolerated  Chronic systolic CHF/ischemic cardiomyopathy-no increased DOE or activity intolerance.  Euvolemic today.  NYHA class II.  Echocardiogram post cardiac catheterization showed an LVEF of 35-40% and mildly reduced RV function.  Previously given blood pressure cuff 05/03/2021.  We will plan to uptitrate Entresto possible and repeat echocardiogram once medication has been optimized x1 month. Continue Entresto, metoprolol Heart healthy low-sodium diet-salty 6 given Increase physical activity as tolerated Order BMP  Hyperlipidemia-04/26/2021: Cholesterol 163; HDL 31; LDL Cholesterol 85; Triglycerides 234; VLDL 47 Continue atorvastatin, fenofibrate Heart healthy low-sodium high-fiber diet Repeat fasting lipids and LFTs in 2 weeks.  Tobacco use  disorder-continues to smoke  occasionally since being discharged from the hospital. Encourage smoking cessation Smoking cessation information provided  Type 2 diabetes-glucose 206 on 05/03/2021 Continue metformin Recommend SLG T2 inhibitor Follows with PCP  Disposition: Follow-up with Dr. Martinique in 3-4 months.  Jossie Ng. Dorothe Elmore NP-C    05/18/2021, 7:08 AM [place] [address] Office [phone] Fax [phone]  Notice: This dictation was prepared with Dragon dictation along with smaller phrase technology. Any transcriptional errors that result from this process are unintentional and may not be corrected upon review.  I spent***minutes examining this patient, reviewing medications, and using patient centered shared decision making involving her cardiac care.  Prior to her visit I spent greater than 20 minutes reviewing her past medical history,  medications, and prior cardiac tests.

## 2021-05-19 ENCOUNTER — Ambulatory Visit: Payer: Medicare Other | Admitting: General Practice

## 2021-06-23 ENCOUNTER — Emergency Department (HOSPITAL_COMMUNITY)
Admission: EM | Admit: 2021-06-23 | Discharge: 2021-06-24 | Disposition: A | Discharge status: Home / Self Care | Payer: Medicare HMO | Attending: Emergency Medicine | Admitting: Emergency Medicine

## 2021-06-23 ENCOUNTER — Other Ambulatory Visit: Payer: Self-pay

## 2021-06-23 ENCOUNTER — Emergency Department (HOSPITAL_COMMUNITY): Payer: Medicare HMO

## 2021-06-23 ENCOUNTER — Encounter (HOSPITAL_COMMUNITY): Payer: Self-pay | Admitting: Emergency Medicine

## 2021-06-23 DIAGNOSIS — F17210 Nicotine dependence, cigarettes, uncomplicated: Secondary | ICD-10-CM | POA: Diagnosis not present

## 2021-06-23 DIAGNOSIS — M79602 Pain in left arm: Secondary | ICD-10-CM | POA: Insufficient documentation

## 2021-06-23 DIAGNOSIS — N189 Chronic kidney disease, unspecified: Secondary | ICD-10-CM | POA: Diagnosis not present

## 2021-06-23 DIAGNOSIS — Z20822 Contact with and (suspected) exposure to covid-19: Secondary | ICD-10-CM | POA: Insufficient documentation

## 2021-06-23 DIAGNOSIS — E1122 Type 2 diabetes mellitus with diabetic chronic kidney disease: Secondary | ICD-10-CM | POA: Diagnosis not present

## 2021-06-23 DIAGNOSIS — Z79899 Other long term (current) drug therapy: Secondary | ICD-10-CM | POA: Diagnosis not present

## 2021-06-23 DIAGNOSIS — Z7984 Long term (current) use of oral hypoglycemic drugs: Secondary | ICD-10-CM | POA: Diagnosis not present

## 2021-06-23 DIAGNOSIS — R42 Dizziness and giddiness: Secondary | ICD-10-CM | POA: Diagnosis not present

## 2021-06-23 DIAGNOSIS — Z7982 Long term (current) use of aspirin: Secondary | ICD-10-CM | POA: Insufficient documentation

## 2021-06-23 LAB — BASIC METABOLIC PANEL
Anion gap: 12 (ref 5–15)
BUN: 17 mg/dL (ref 8–23)
CO2: 24 mmol/L (ref 22–32)
Calcium: 10.2 mg/dL (ref 8.9–10.3)
Chloride: 96 mmol/L — ABNORMAL LOW (ref 98–111)
Creatinine, Ser: 0.98 mg/dL (ref 0.61–1.24)
GFR, Estimated: >60 mL/min (ref >60)
Glucose, Bld: 296 mg/dL — ABNORMAL HIGH (ref 70–99)
Potassium: 4.3 mmol/L (ref 3.5–5.1)
Sodium: 132 mmol/L — ABNORMAL LOW (ref 135–145)

## 2021-06-23 LAB — URINALYSIS, ROUTINE W REFLEX MICROSCOPIC
Bacteria, UA: NONE SEEN
Bilirubin Urine: NEGATIVE
Glucose, UA: >=500 mg/dL — AB
Hgb urine dipstick: NEGATIVE
Ketones, ur: NEGATIVE mg/dL
Leukocytes,Ua: NEGATIVE
Nitrite: NEGATIVE
Protein, ur: NEGATIVE mg/dL
Specific Gravity, Urine: 1.03 (ref 1.005–1.030)
pH: 5 (ref 5.0–8.0)

## 2021-06-23 LAB — CBC
HCT: 45.7 % (ref 39.0–52.0)
Hemoglobin: 15.4 g/dL (ref 13.0–17.0)
MCH: 31.3 pg (ref 26.0–34.0)
MCHC: 33.7 g/dL (ref 30.0–36.0)
MCV: 92.9 fL (ref 80.0–100.0)
Platelets: 344 10*3/uL (ref 150–400)
RBC: 4.92 MIL/uL (ref 4.22–5.81)
RDW: 13.3 % (ref 11.5–15.5)
WBC: 23.1 10*3/uL — ABNORMAL HIGH (ref 4.0–10.5)
nRBC: 0 % (ref 0.0–0.2)

## 2021-06-23 NOTE — ED Triage Notes (Signed)
Pt states he was thrown out of [employer] today. States he feels exhausted, "whoozing" and not himself. Endorses pain to bilateral wrist.

## 2021-06-23 NOTE — ED Provider Notes (Signed)
Emergency Medicine Provider Triage Evaluation Note  Barry Mccoy , a 70 y.o. male  was evaluated in triage.  Pt complains of weak.  Review of Systems  Positive: Weak, bilateral wrist pain, hungry, no adequate sleep Negative: Fever, cough, dysuria, cp  Physical Exam  BP 133/75   Pulse 85   Temp 98 F (36.7 C) (Oral)   Resp 18   SpO2 98%  Gen:   Awake, no distress   Resp:  Normal effort  MSK:   Moves extremities without difficulty  Other:    Medical Decision Making  Medically screening exam initiated at 4:44 PM.  Appropriate orders placed.  Damario Gillie was informed that the remainder of the evaluation will be completed by another provider, this initial triage assessment does not replace that evaluation, and the importance of remaining in the ED until their evaluation is complete.  PT report he was kicked out of [employer] yesterday because they accused him of messing around with a woman, he denies.  He didn't get enough sleep, haven't ate, felt hungry and not well and also report having bilateral wrists pain without injury.    Domenic Moras, PA-C 06/23/21 1649    Hayden Rasmussen, MD 06/23/21 [phone]

## 2021-06-24 ENCOUNTER — Emergency Department (HOSPITAL_COMMUNITY): Payer: Medicare HMO

## 2021-06-24 ENCOUNTER — Other Ambulatory Visit: Payer: Self-pay

## 2021-06-24 ENCOUNTER — Encounter (HOSPITAL_COMMUNITY): Payer: Self-pay | Admitting: Emergency Medicine

## 2021-06-24 LAB — RESP PANEL BY RT-PCR (FLU A&B, COVID) ARPGX2
Influenza A by PCR: NEGATIVE
Influenza B by PCR: NEGATIVE
SARS Coronavirus 2 by RT PCR: NEGATIVE

## 2021-06-24 LAB — TROPONIN I (HIGH SENSITIVITY)
Troponin I (High Sensitivity): 7 ng/L (ref <18)
Troponin I (High Sensitivity): 7 ng/L (ref <18)

## 2021-06-24 NOTE — ED Notes (Signed)
Followed-up with lab about pt Troponin. Lab stated it takes 45 minutes and should result shortly.

## 2021-06-24 NOTE — ED Notes (Signed)
Pt stated he feels off. He just felt a whooshing feeling and bilateral wrist pain. Pt states he does have neuropathy. He is currently out of his Gabapentin medication. Pt denies any pain at the moment

## 2021-06-24 NOTE — ED Provider Notes (Signed)
[facility] EMERGENCY DEPARTMENT Provider Note   CSN: [id number] Arrival date & time: 06/23/21  1627     History Chief Complaint  Patient presents with   Dizziness    Barry Mccoy is a 70 y.o. male.  The history is provided by the patient.  Dizziness Quality:  Lightheadedness Severity:  Mild Onset quality:  Gradual Duration:  1 day Timing:  Constant Progression:  Resolved Chronicity:  New Context comment:  Getting thrown out of the hotel he lives in Relieved by:  Nothing Worsened by:  Nothing Ineffective treatments:  None tried Associated symptoms: no blood in stool, no chest pain, no diarrhea, no headaches, no hearing loss, no nausea, no palpitations, no syncope, no tinnitus, no vision changes, no vomiting and no weakness   Associated symptoms comment:  Left arm pain  Risk factors: no anemia   Patient with ETOH abuse comes in for lightheadedness and left arm pain.  No trauma.  No CP, no n/v/d.  No back or neck pain.  No weakness.      Past Medical History:  Diagnosis Date   Anxiety    BPH (benign prostatic hyperplasia)    Cervical spine disease    Chronic kidney disease    kidney stones   Depression    Disorder of lumbar spine    ETOH abuse    GI bleed    Muscle spasm     Patient Active Problem List   Diagnosis Date Noted   STEMI involving right coronary artery ([place]) 04/25/2021   Type 2 diabetes mellitus without complication, without long-term current use of insulin ([place]) 04/25/2021   Acute pancreatitis    GI bleed 11/06/2016   Acute blood loss anemia 11/06/2016   Nausea with vomiting 11/06/2016   Alcohol abuse 11/06/2016   UTI (urinary tract infection) 10/23/2016   Urinary retention 10/23/2016   Positive D dimer    Severe single current episode of major depressive disorder, with psychotic features ([place])    Normocytic anemia 10/01/2016   Acute GI bleeding 10/01/2016   Grief reaction 04/19/2016   Depression 04/19/2016   Benign  prostatic hyperplasia 04/19/2016   Testosterone deficiency 10/26/2015   Vitamin D deficiency 10/21/2015   Tobacco abuse 10/17/2015   Chronic pain syndrome 10/17/2015   Anxiety    Cervical spine disease    Disorder of lumbar spine     Past Surgical History:  Procedure Laterality Date   COLONOSCOPY WITH PROPOFOL N/A 11/08/2016   Procedure: COLONOSCOPY WITH PROPOFOL;  Surgeon: Wilford Corner, MD;  Location: [facility];  Service: Endoscopy;  Laterality: N/A;   CORONARY STENT INTERVENTION N/A 04/25/2021   Procedure: CORONARY STENT INTERVENTION;  Surgeon: Martinique, Peter M, MD;  Location: [facility];  Service: Cardiovascular;  Laterality: N/A;  RCA   ELBOW SURGERY     as child   ELBOW SURGERY     ENTEROSCOPY N/A 11/19/2016   Procedure: ENTEROSCOPY;  Surgeon: Wonda Horner, MD;  Location: [facility];  Service: Endoscopy;  Laterality: N/A;   ESOPHAGOGASTRODUODENOSCOPY N/A 10/04/2016   Procedure: ESOPHAGOGASTRODUODENOSCOPY (EGD);  Surgeon: Carol Ada, MD;  Location: [facility];  Service: Gastroenterology;  Laterality: N/A;   GIVENS CAPSULE STUDY N/A 11/09/2016   Procedure: GIVENS CAPSULE STUDY;  Surgeon: Wilford Corner, MD;  Location: [facility];  Service: Endoscopy;  Laterality: N/A;   LEFT HEART CATH AND CORONARY ANGIOGRAPHY N/A 04/25/2021   Procedure: LEFT HEART CATH AND CORONARY ANGIOGRAPHY;  Surgeon: Martinique, Peter M, MD;  Location: [place]  [facility];  Service: Cardiovascular;  Laterality: N/A;       Family History  Problem Relation Age of Onset   Colon cancer Brother    Alcohol abuse Brother    Cancer Brother    Early death Brother    Prostate cancer Brother    Colon polyps Brother    Cancer Mother 87       colon cancer   Colon cancer Mother     Social History   Tobacco Use   Smoking status: Every Day    Packs/day: 1.00    Years: 50.00    Pack years: 50.00    Types: Cigarettes   Smokeless tobacco: Never   Tobacco comments:    attempting to quit- down to  8 a day   Vaping Use   Vaping Use: Never used  Substance Use Topics   Alcohol use: No    Comment: 2 drinks a month   Drug use: Not Currently    Types: Marijuana    Home Medications Prior to Admission medications   Medication Sig Start Date End Date Taking? Authorizing Provider  acetaminophen (TYLENOL) 325 MG tablet Take 2 tablets (650 mg total) by mouth every 6 (six) hours as needed for mild pain, moderate pain or headache (or Fever >/= 101). Patient not taking: No sig reported 10/27/16   Murlean Iba, MD  aspirin 81 MG EC tablet Take 1 tablet (81 mg total) by mouth daily. Swallow whole. 04/28/21   Bhagat, Crista Luria, PA  atorvastatin (LIPITOR) 80 MG tablet Take 1 tablet (80 mg total) by mouth daily. 04/28/21   Bhagat, Crista Luria, PA  Brimonidine Tartrate (LUMIFY) 0.025 % SOLN Place 1 drop into both eyes daily as needed (redness).    [provider]  Cholecalciferol (VITAMIN D-3) 5000 units TABS Take 50,000 Units by mouth daily.    [provider]  fenofibrate 160 MG tablet Take 1 tablet (160 mg total) by mouth daily. 04/28/21   Bhagat, Crista Luria, PA  folic acid (FOLVITE) 1 MG tablet Take 1 tablet (1 mg total) by mouth daily. Patient not taking: No sig reported 10/28/16   Irwin Brakeman L, MD  gabapentin (NEURONTIN) 300 MG capsule Take 300 mg by mouth 3 (three) times daily. Patient not taking: Reported on 05/03/2021    [provider]  iron polysaccharides (NIFEREX) 150 MG capsule Take 1 capsule (150 mg total) by mouth 2 (two) times daily. Patient not taking: Reported on 05/03/2021 11/21/16   Elgergawy, Silver Huguenin, MD  lidocaine (LIDODERM) 5 % Place 1 patch onto the skin every 12 (twelve) hours as needed (pain).  Patient not taking: Reported on 05/03/2021 09/12/16   [provider]  metFORMIN (GLUCOPHAGE) 500 MG tablet Take 1 tablet (500 mg total) by mouth 2 (two) times daily with a meal. 04/27/21 04/27/22  Bhagat, Crista Luria, PA  metoprolol succinate  (TOPROL-XL) 25 MG 24 hr tablet Take 1 tablet (25 mg total) by mouth daily. 04/28/21   Leanor Kail, PA  Multiple Vitamin (MULTIVITAMIN WITH MINERALS) TABS tablet Take 1 tablet by mouth at bedtime. Patient not taking: Reported on 05/03/2021    [provider]  nitroGLYCERIN (NITROSTAT) 0.4 MG SL tablet Place 1 tablet (0.4 mg total) under the tongue every 5 (five) minutes x 3 doses as needed for chest pain. 04/27/21   Bhagat, Crista Luria, PA  oxybutynin (DITROPAN-XL) 5 MG 24 hr tablet Take 1 tablet by mouth daily. 01/13/20   [provider]  pantoprazole (PROTONIX) 40 MG tablet Take  1 tablet (40 mg total) by mouth daily. 04/27/21   Bhagat, Crista Luria, PA  QUEtiapine (SEROQUEL) 50 MG tablet TAKE 1 TABLET (50 MG TOTAL) BY MOUTH AT BEDTIME. 11/26/16   Dixon, Lonie Peak, PA-C  sacubitril-valsartan (ENTRESTO) 24-26 MG Take 1 tablet by mouth 2 (two) times daily. 05/03/21   Katherine Roan, MD  sertraline (ZOLOFT) 50 MG tablet TAKE 1 TABLET BY MOUTH AT BEDTIME Patient not taking: No sig reported 11/26/16   Dena Billet B, PA-C  tamsulosin (FLOMAX) 0.4 MG CAPS capsule TAKE ONE CAPSULE BY MOUTH AT BEDTIME Patient not taking: Reported on 05/03/2021 11/26/16   [name], PA-C  thiamine 100 MG tablet Take 1 tablet (100 mg total) by mouth daily. For 4days then down to 100mg  daily Patient taking differently: Take 100 mg by mouth 3 (three) times daily. 10/27/16   Johnson, Clanford L, MD  ticagrelor (BRILINTA) 90 MG TABS tablet Take 1 tablet (90 mg total) by mouth 2 (two) times daily. 04/27/21   Leanor Kail, PA  traZODone (DESYREL) 100 MG tablet Take 1 tablet (100 mg total) by mouth at bedtime. Patient not taking: No sig reported 10/27/16   Murlean Iba, MD  vitamin B-12 1000 MCG tablet Take 1 tablet (1,000 mcg total) by mouth daily. 11/22/16   Elgergawy, Silver Huguenin, MD    Allergies    Shellfish allergy  Review of Systems   Review of Systems  Constitutional:  Negative for fever.   HENT:  Negative for hearing loss and tinnitus.   Eyes:  Negative for redness.  Respiratory:  Negative for wheezing and stridor.   Cardiovascular:  Negative for chest pain, palpitations, leg swelling and syncope.  Gastrointestinal:  Negative for blood in stool, diarrhea, nausea and vomiting.  Genitourinary:  Negative for difficulty urinating.  Musculoskeletal:  Negative for neck pain and neck stiffness.  Skin:  Negative for rash and wound.  Neurological:  Positive for light-headedness. Negative for facial asymmetry, weakness and headaches.  Psychiatric/Behavioral:  Negative for agitation.   All other systems reviewed and are negative.  Physical Exam Updated Vital Signs BP 134/81 (BP Location: Left Arm)   Pulse 77   Temp 98.6 F (37 C) (Oral)   Resp 14   Ht 6' (1.829 m)   Wt 95.3 kg   SpO2 96%   BMI 28.48 kg/m   Physical Exam Vitals and nursing note reviewed.  Constitutional:      General: He is not in acute distress.    Appearance: Normal appearance.  HENT:     Head: Normocephalic and atraumatic.     Nose: Nose normal.  Eyes:     Conjunctiva/sclera: Conjunctivae normal.     Pupils: Pupils are equal, round, and reactive to light.  Cardiovascular:     Rate and Rhythm: Normal rate and regular rhythm.     Pulses: Normal pulses.     Heart sounds: Normal heart sounds.  Pulmonary:     Effort: Pulmonary effort is normal.     Breath sounds: Normal breath sounds.  Abdominal:     General: Abdomen is flat. Bowel sounds are normal.     Palpations: Abdomen is soft.     Tenderness: There is no abdominal tenderness. There is no guarding.  Musculoskeletal:        General: Normal range of motion.     Cervical back: Normal range of motion and neck supple.  Skin:    General: Skin is warm and dry.     Capillary Refill:  Capillary refill takes less than 2 seconds.  Neurological:     General: No focal deficit present.     Mental Status: He is alert and oriented to person, place, and  time.     Deep Tendon Reflexes: Reflexes normal.  Psychiatric:        Mood and Affect: Mood normal.        Behavior: Behavior normal.    ED Results / Procedures / Treatments   Labs (all labs ordered are listed, but only abnormal results are displayed) Labs Reviewed  BASIC METABOLIC PANEL - Abnormal; Notable for the following components:      Result Value   Sodium 132 (*)    Chloride 96 (*)    Glucose, Bld 296 (*)    All other components within normal limits  CBC - Abnormal; Notable for the following components:   WBC 23.1 (*)    All other components within normal limits  URINALYSIS, ROUTINE W REFLEX MICROSCOPIC - Abnormal; Notable for the following components:   Glucose, UA >=500 (*)    All other components within normal limits  RESP PANEL BY RT-PCR (FLU A&B, COVID) [id number]  CBG MONITORING, ED  TROPONIN I (HIGH SENSITIVITY)  TROPONIN I (HIGH SENSITIVITY)    EKG None  Radiology DG Chest 1 View  Result Date: 06/24/2021 CLINICAL DATA:  Dizziness EXAM: PORTABLE CHEST 1 VIEW COMPARISON:  11/05/2016 FINDINGS: Cardiac shadow is within normal limits. Lungs are well aerated bilaterally. Likely nipple shadow over the low right lung base. No bony abnormality is noted. IMPRESSION: Likely nipple shadow over the right lung base. Repeat frontal view with nipple markers is recommended. No other focal abnormality is noted. Electronically Signed   By: Inez Catalina M.D.   On: 06/24/2021 00:09   DG Chest 2 View  Result Date: 06/24/2021 CLINICAL DATA:  Repeat with nipple markers EXAM: CHEST - 2 VIEW COMPARISON:  Earlier today FINDINGS: No nodule separate from the patient's nipple markers. Interstitial coarsening likely related to emphysema on 2018 chest CT. There is no edema, consolidation, effusion, or pneumothorax. Normal heart size and mediastinal contours. Coronary stenting. IMPRESSION: No evidence of pulmonary nodule. COPD without acute finding. Electronically Signed   By: Jorje Guild  M.D.   On: 06/24/2021 04:49    Procedures Procedures   Medications Ordered in ED Medications - No data to display  ED Course  I have reviewed the triage vital signs and the nursing notes.  Pertinent labs & imaging results that were available during my care of the patient were reviewed by me and considered in my medical decision making (see chart for details).   Ruled out for MI in the ED with 2 negative troponins and atypical story.   No PNA.  I suspect this is all due to getting thrown out of the hotel.  Stable for discharge with close follow up.   Final Clinical Impression(s) / ED Diagnoses  Return for intractable cough, coughing up blood, fevers > 100.4 unrelieved by medication, shortness of breath, intractable vomiting, chest pain, shortness of breath, weakness, numbness, changes in speech, facial asymmetry, abdominal pain, passing out, Inability to tolerate liquids or food, cough, altered mental status or any concerns. No signs of systemic illness or infection. The patient is nontoxic-appearing on exam and vital signs are within normal limits.  I have reviewed the triage vital signs and the nursing notes. Pertinent labs & imaging results that were available during my care of the patient were reviewed by me and considered  in my medical decision making (see chart for details). After history, exam, and medical workup I feel the patient has been appropriately medically screened and is safe for discharge home. Pertinent diagnoses were discussed with the patient. Patient was given return precautions.  Rx / DC Orders ED Discharge Orders     None        Nakeitha Milligan, MD 06/24/21 [phone]

## 2021-07-27 ENCOUNTER — Emergency Department (HOSPITAL_BASED_OUTPATIENT_CLINIC_OR_DEPARTMENT_OTHER): Payer: Medicare HMO

## 2021-07-27 ENCOUNTER — Encounter (HOSPITAL_COMMUNITY): Payer: Self-pay

## 2021-07-27 ENCOUNTER — Emergency Department (HOSPITAL_COMMUNITY)
Admission: EM | Admit: 2021-07-27 | Discharge: 2021-07-27 | Disposition: A | Discharge status: Home / Self Care | Payer: Medicare HMO | Attending: Emergency Medicine | Admitting: Emergency Medicine

## 2021-07-27 ENCOUNTER — Emergency Department (HOSPITAL_COMMUNITY): Payer: Medicare HMO

## 2021-07-27 DIAGNOSIS — N189 Chronic kidney disease, unspecified: Secondary | ICD-10-CM | POA: Insufficient documentation

## 2021-07-27 DIAGNOSIS — Z7982 Long term (current) use of aspirin: Secondary | ICD-10-CM | POA: Insufficient documentation

## 2021-07-27 DIAGNOSIS — E1122 Type 2 diabetes mellitus with diabetic chronic kidney disease: Secondary | ICD-10-CM | POA: Insufficient documentation

## 2021-07-27 DIAGNOSIS — M79662 Pain in left lower leg: Secondary | ICD-10-CM

## 2021-07-27 DIAGNOSIS — Z79899 Other long term (current) drug therapy: Secondary | ICD-10-CM | POA: Diagnosis not present

## 2021-07-27 DIAGNOSIS — F17210 Nicotine dependence, cigarettes, uncomplicated: Secondary | ICD-10-CM | POA: Insufficient documentation

## 2021-07-27 DIAGNOSIS — Z7984 Long term (current) use of oral hypoglycemic drugs: Secondary | ICD-10-CM | POA: Insufficient documentation

## 2021-07-27 DIAGNOSIS — I82402 Acute embolism and thrombosis of unspecified deep veins of left lower extremity: Secondary | ICD-10-CM

## 2021-07-27 MED ORDER — APIXABAN (ELIQUIS) VTE STARTER PACK (10MG AND 5MG): ORAL_TABLET | ORAL | 0 refills | Status: AC

## 2021-07-27 MED ORDER — APIXABAN 5 MG PO TABS
10.0000 mg | ORAL_TABLET | Freq: Once | ORAL | Status: AC
  Administered 2021-07-27: 12:00:00 10 mg via ORAL
  Filled 2021-07-27: qty 2

## 2021-07-27 MED ORDER — HYDROCODONE-ACETAMINOPHEN 5-325 MG PO TABS
1.0000 | ORAL_TABLET | Freq: Once | ORAL | Status: AC
  Administered 2021-07-27: 09:00:00 1 via ORAL
  Filled 2021-07-27: qty 1

## 2021-07-27 MED ORDER — TRAMADOL HCL 50 MG PO TABS: 50.0000 mg | ORAL_TABLET | Freq: Four times a day (QID) | ORAL | 0 refills | Status: AC | PRN

## 2021-07-27 MED ORDER — ONDANSETRON 4 MG PO TBDP
4.0000 mg | ORAL_TABLET | Freq: Once | ORAL | Status: AC
  Administered 2021-07-27: 09:00:00 4 mg via ORAL
  Filled 2021-07-27: qty 1

## 2021-07-27 MED ORDER — APIXABAN (ELIQUIS) VTE STARTER PACK (10MG AND 5MG): ORAL_TABLET | ORAL | 0 refills | Status: DC

## 2021-07-27 MED ORDER — APIXABAN (ELIQUIS) EDUCATION KIT FOR DVT/PE PATIENTS: PACK | Freq: Once | Status: DC

## 2021-07-27 NOTE — Progress Notes (Signed)
LLE venous duplex has been completed.  Preliminary results given to Margarita Mail, PA.   Results can be found under chart review under CV PROC. 07/27/2021 11:31 AM Ihsan Nomura RVT, RDMS

## 2021-07-27 NOTE — Discharge Instructions (Addendum)
DO NOT use the aspirin or Brillinta (ticagrelor) while you are taking Eliquis due to your history of GI bleed. Avoid use of naproxen or ibuprofen. You may take tylenol. Get help right away for the following reasons: You have signs or symptoms that a blood clot has moved to the lungs. These may include: Shortness of breath. Chest pain. Fast or irregular heartbeats (palpitations). Light-headedness, dizziness, or fainting. Coughing up blood. You have signs or symptoms that your blood is too thin. These may include: Blood in your vomit, stool, or urine. A cut that will not stop bleeding despite appluying direct pressure without letting up for at least 20 minutes. A severe headache or confusion. These symptoms may be an emergency. Get help right away. Call 911. Do not wait to see if the symptoms will go away. Do not drive yourself to the hospital. Information on my medicine - ELIQUIS (apixaban)  This medication education was reviewed with me or my healthcare representative as part of my discharge preparation.  The pharmacist that spoke with me during my hospital stay was:  Lynelle Doctor, [facility]  Why was Eliquis prescribed for you? Eliquis was prescribed to treat blood clots that may have been found in the veins of your legs (deep vein thrombosis) or in your lungs (pulmonary embolism) and to reduce the risk of them occurring again.  What do You need to know about Eliquis ? The starting dose is 10 mg (two 5 mg tablets) taken TWICE daily for the FIRST SEVEN (7) DAYS, then on 08/03/21  the dose is reduced to ONE 5 mg tablet taken TWICE daily.  Eliquis may be taken with or without food.   Try to take the dose about the same time in the morning and in the evening. If you have difficulty swallowing the tablet whole please discuss with your pharmacist how to take the medication safely.  Take Eliquis exactly as prescribed and DO NOT stop taking Eliquis without talking to the doctor who prescribed  the medication.  Stopping may increase your risk of developing a new blood clot.  Refill your prescription before you run out.  After discharge, you should have regular check-up appointments with your healthcare provider that is prescribing your Eliquis.    What do you do if you miss a dose? If a dose of ELIQUIS is not taken at the scheduled time, take it as soon as possible on the same day and twice-daily administration should be resumed. The dose should not be doubled to make up for a missed dose.  Important Safety Information A possible side effect of Eliquis is bleeding. You should call your healthcare provider right away if you experience any of the following: Bleeding from an injury or your nose that does not stop. Unusual colored urine (red or dark brown) or unusual colored stools (red or black). Unusual bruising for unknown reasons. A serious fall or if you hit your head (even if there is no bleeding).  Some medicines may interact with Eliquis and might increase your risk of bleeding or clotting while on Eliquis. To help avoid this, consult your healthcare provider or pharmacist prior to using any new prescription or non-prescription medications, including herbals, vitamins, non-steroidal anti-inflammatory drugs (NSAIDs) and supplements.  This website has more information on Eliquis (apixaban): http://www.eliquis.com/eliquis/home

## 2021-07-27 NOTE — ED Provider Notes (Signed)
[place] DEPT Provider Note   CSN: [id number] Arrival date & time: 07/27/21  0849     History Chief Complaint  Patient presents with   Leg Pain    Barry Mccoy , a 70 y.o. male  who complains of left calf pain.  He woke with severe pain that he rates at 9 out of 10 in his left calf.  It radiates down into his foot.  He denies back pain, weakness.  He has never had anything like this before.  Patient at pain originates in the popliteal area on the left.  He has a history of neuropathy.  He is a daily smoker.  He denies new numbness or tingling.  He denies injury.   Leg Pain     Past Medical History:  Diagnosis Date   Anxiety    BPH (benign prostatic hyperplasia)    Cervical spine disease    Chronic kidney disease    kidney stones   Depression    Disorder of lumbar spine    ETOH abuse    GI bleed    Muscle spasm     Patient Active Problem List   Diagnosis Date Noted   STEMI involving right coronary artery ([place]) 04/25/2021   Type 2 diabetes mellitus without complication, without long-term current use of insulin ([place]) 04/25/2021   Acute pancreatitis    GI bleed 11/06/2016   Acute blood loss anemia 11/06/2016   Nausea with vomiting 11/06/2016   Alcohol abuse 11/06/2016   UTI (urinary tract infection) 10/23/2016   Urinary retention 10/23/2016   Positive D dimer    Severe single current episode of major depressive disorder, with psychotic features ([place])    Normocytic anemia 10/01/2016   Acute GI bleeding 10/01/2016   Grief reaction 04/19/2016   Depression 04/19/2016   Benign prostatic hyperplasia 04/19/2016   Testosterone deficiency 10/26/2015   Vitamin D deficiency 10/21/2015   Tobacco abuse 10/17/2015   Chronic pain syndrome 10/17/2015   Anxiety    Cervical spine disease    Disorder of lumbar spine     Past Surgical History:  Procedure Laterality Date   COLONOSCOPY WITH PROPOFOL N/A 11/08/2016   Procedure: COLONOSCOPY WITH  PROPOFOL;  Surgeon: Wilford Corner, MD;  Location: [facility];  Service: Endoscopy;  Laterality: N/A;   CORONARY STENT INTERVENTION N/A 04/25/2021   Procedure: CORONARY STENT INTERVENTION;  Surgeon: Martinique, Peter M, MD;  Location: [facility];  Service: Cardiovascular;  Laterality: N/A;  RCA   ELBOW SURGERY     as child   ELBOW SURGERY     ENTEROSCOPY N/A 11/19/2016   Procedure: ENTEROSCOPY;  Surgeon: Wonda Horner, MD;  Location: [facility];  Service: Endoscopy;  Laterality: N/A;   ESOPHAGOGASTRODUODENOSCOPY N/A 10/04/2016   Procedure: ESOPHAGOGASTRODUODENOSCOPY (EGD);  Surgeon: Carol Ada, MD;  Location: [facility];  Service: Gastroenterology;  Laterality: N/A;   GIVENS CAPSULE STUDY N/A 11/09/2016   Procedure: GIVENS CAPSULE STUDY;  Surgeon: Wilford Corner, MD;  Location: [facility];  Service: Endoscopy;  Laterality: N/A;   LEFT HEART CATH AND CORONARY ANGIOGRAPHY N/A 04/25/2021   Procedure: LEFT HEART CATH AND CORONARY ANGIOGRAPHY;  Surgeon: Martinique, Peter M, MD;  Location: [facility];  Service: Cardiovascular;  Laterality: N/A;       Family History  Problem Relation Age of Onset   Colon cancer Brother    Alcohol abuse Brother    Cancer Brother    Early death Brother    Prostate cancer  Brother    Colon polyps Brother    Cancer Mother 69       colon cancer   Colon cancer Mother     Social History   Tobacco Use   Smoking status: Every Day    Packs/day: 1.00    Years: 50.00    Pack years: 50.00    Types: Cigarettes   Smokeless tobacco: Never   Tobacco comments:    attempting to quit- down to 8 a day   Vaping Use   Vaping Use: Never used  Substance Use Topics   Alcohol use: No    Comment: 2 drinks a month   Drug use: Not Currently    Types: Marijuana    Home Medications Prior to Admission medications   Medication Sig Start Date End Date Taking? Authorizing Provider  acetaminophen (TYLENOL) 325 MG tablet Take 2 tablets (650 mg total) by mouth  every 6 (six) hours as needed for mild pain, moderate pain or headache (or Fever >/= 101). Patient not taking: No sig reported 10/27/16   Murlean Iba, MD  aspirin 81 MG EC tablet Take 1 tablet (81 mg total) by mouth daily. Swallow whole. 04/28/21   Bhagat, Crista Luria, PA  atorvastatin (LIPITOR) 80 MG tablet Take 1 tablet (80 mg total) by mouth daily. 04/28/21   Bhagat, Crista Luria, PA  Brimonidine Tartrate (LUMIFY) 0.025 % SOLN Place 1 drop into both eyes daily as needed (redness).    [provider]  Cholecalciferol (VITAMIN D-3) 5000 units TABS Take 50,000 Units by mouth daily.    [provider]  fenofibrate 160 MG tablet Take 1 tablet (160 mg total) by mouth daily. 04/28/21   Bhagat, Crista Luria, PA  folic acid (FOLVITE) 1 MG tablet Take 1 tablet (1 mg total) by mouth daily. Patient not taking: No sig reported 10/28/16   Irwin Brakeman L, MD  gabapentin (NEURONTIN) 300 MG capsule Take 300 mg by mouth 3 (three) times daily. Patient not taking: Reported on 05/03/2021    [provider]  iron polysaccharides (NIFEREX) 150 MG capsule Take 1 capsule (150 mg total) by mouth 2 (two) times daily. Patient not taking: Reported on 05/03/2021 11/21/16   Elgergawy, Silver Huguenin, MD  lidocaine (LIDODERM) 5 % Place 1 patch onto the skin every 12 (twelve) hours as needed (pain).  Patient not taking: Reported on 05/03/2021 09/12/16   [provider]  metFORMIN (GLUCOPHAGE) 500 MG tablet Take 1 tablet (500 mg total) by mouth 2 (two) times daily with a meal. 04/27/21 04/27/22  Bhagat, Crista Luria, PA  metoprolol succinate (TOPROL-XL) 25 MG 24 hr tablet Take 1 tablet (25 mg total) by mouth daily. 04/28/21   Leanor Kail, PA  Multiple Vitamin (MULTIVITAMIN WITH MINERALS) TABS tablet Take 1 tablet by mouth at bedtime. Patient not taking: Reported on 05/03/2021    [provider]  nitroGLYCERIN (NITROSTAT) 0.4 MG SL tablet Place 1 tablet (0.4 mg total) under the tongue  every 5 (five) minutes x 3 doses as needed for chest pain. 04/27/21   Bhagat, Crista Luria, PA  oxybutynin (DITROPAN-XL) 5 MG 24 hr tablet Take 1 tablet by mouth daily. 01/13/20   [provider]  pantoprazole (PROTONIX) 40 MG tablet Take 1 tablet (40 mg total) by mouth daily. 04/27/21   Bhagat, Crista Luria, PA  QUEtiapine (SEROQUEL) 50 MG tablet TAKE 1 TABLET (50 MG TOTAL) BY MOUTH AT BEDTIME. 11/26/16   Dixon, Lonie Peak, PA-C  sacubitril-valsartan (ENTRESTO) 24-26 MG Take 1 tablet by mouth 2 (two)  times daily. 05/03/21   Katherine Roan, MD  sertraline (ZOLOFT) 50 MG tablet TAKE 1 TABLET BY MOUTH AT BEDTIME Patient not taking: No sig reported 11/26/16   Dena Billet B, PA-C  tamsulosin (FLOMAX) 0.4 MG CAPS capsule TAKE ONE CAPSULE BY MOUTH AT BEDTIME Patient not taking: Reported on 05/03/2021 11/26/16   [name], PA-C  thiamine 100 MG tablet Take 1 tablet (100 mg total) by mouth daily. For 4days then down to 100mg  daily Patient taking differently: Take 100 mg by mouth 3 (three) times daily. 10/27/16   Johnson, Clanford L, MD  ticagrelor (BRILINTA) 90 MG TABS tablet Take 1 tablet (90 mg total) by mouth 2 (two) times daily. 04/27/21   Leanor Kail, PA  traZODone (DESYREL) 100 MG tablet Take 1 tablet (100 mg total) by mouth at bedtime. Patient not taking: No sig reported 10/27/16   Murlean Iba, MD  vitamin B-12 1000 MCG tablet Take 1 tablet (1,000 mcg total) by mouth daily. 11/22/16   Elgergawy, Silver Huguenin, MD    Allergies    Shellfish allergy  Review of Systems   Review of Systems Ten systems reviewed and are negative for acute change, except as noted in the HPI.   Physical Exam Updated Vital Signs BP 134/78 (BP Location: Right Arm)   Pulse 91   Temp 98.1 F (36.7 C) (Oral)   Resp 17   SpO2 98%   Physical Exam Vitals and nursing note reviewed.  Constitutional:      General: He is not in acute distress.    Appearance: He is well-developed. He is not diaphoretic.   HENT:     Head: Normocephalic and atraumatic.  Eyes:     General: No scleral icterus.    Conjunctiva/sclera: Conjunctivae normal.  Cardiovascular:     Rate and Rhythm: Normal rate and regular rhythm.     Pulses:          Popliteal pulses are 1+ on the right side and 1+ on the left side.       Dorsalis pedis pulses are 1+ on the right side and 1+ on the left side.     Heart sounds: Normal heart sounds.  Pulmonary:     Effort: Pulmonary effort is normal. No respiratory distress.     Breath sounds: Normal breath sounds.  Abdominal:     Palpations: Abdomen is soft.     Tenderness: There is no abdominal tenderness.  Musculoskeletal:     Cervical back: Normal range of motion and neck supple.     Right lower leg: No edema.     Left lower leg: Tenderness present. No edema.     Comments: TTP Left leg. Pos Homan's sign Cap refill <2 seconds in the toes.  Skin:    General: Skin is warm and dry.  Neurological:     Mental Status: He is alert.  Psychiatric:        Behavior: Behavior normal.    ED Results / Procedures / Treatments   Labs (all labs ordered are listed, but only abnormal results are displayed) Labs Reviewed - No data to display  EKG None  Radiology No results found.  Procedures Procedures   Medications Ordered in ED Medications  HYDROcodone-acetaminophen (NORCO/VICODIN) 5-325 MG per tablet 1 tablet (1 tablet Oral Given 07/27/21 0922)  ondansetron (ZOFRAN-ODT) disintegrating tablet 4 mg (4 mg Oral Given 07/27/21 5361)    ED Course  I have reviewed the triage vital signs and the nursing notes.  Pertinent labs & imaging results that were available during my care of the patient were reviewed by me and considered in my medical decision making (see chart for details).    MDM Rules/Calculators/A&P  70 year old male who presents the emergency department with chief complaint of left lower extremity pain.  Differential diagnosis includes arterial occlusion, DVT,  trauma, musculoskeletal injury, compartment syndrome.  On evaluation patient has palpable distal pulses and normal capillary refill, tenderness to calf without any overt swelling.  Compartments are soft. I ordered and reviewed a vascular ultrasound of the left lower extremity.  Patient has an extensive DVT of the deep system.  He has no evidence of phlegmasia cerulea Dolens or alba. Patient has a history of GI bleed and has Brilinta listed in his medication list but states he has not been taking any of his medications.  Patient will be started on Eliquis for treatment of the DVT.  Given his history of GI bleeds I have asked that he discontinue and not use Brilinta or aspirin in association with the NOAC.  He can follow-up with his cardiologist or primary care physician for further management.  Discussed outpatient follow-up and return precautions.  Patient appears appropriate for discharge at this time Final Clinical Impression(s) / ED Diagnoses Final diagnoses:  None    Rx / DC Orders ED Discharge Orders     None        Margarita Mail, PA-C 07/27/21 1403    Isla Pence, MD 07/27/21 1415

## 2021-07-27 NOTE — ED Provider Notes (Signed)
Emergency Medicine Provider Triage Evaluation Note  Barry Mccoy , a 70 y.o. male  was evaluated in triage.  Pt complains of left calf pain.  He woke with severe pain that he rates at 9 out of 10 in his left calf.  It radiates down into his foot.  He denies back pain, weakness.  He has never had anything like this before.  Patient at pain originates in the popliteal area on the left.  He has a history of neuropathy.  He is a daily smoker.  He denies new numbness or tingling.  He denies injury.   Review of Systems  Positive: Calf pain Negative: Cough, chest pain or shortness of breath  Physical Exam  BP 134/78 (BP Location: Right Arm)   Pulse 91   Temp 98.1 F (36.7 C) (Oral)   Resp 17   SpO2 98%  Gen:   Awake, no distress   Resp:  Normal effort  MSK:   Moves extremities without difficulty  Other:  Left calf is exquisitely tender to palpation.  He has 1+ pulses, DP and PT.  Capillary refill less than 2 seconds.  No obvious swelling  Medical Decision Making  Medically screening exam initiated at 9:14 AM.  Appropriate orders placed.  Stepen Prins was informed that the remainder of the evaluation will be completed by another provider, this initial triage assessment does not replace that evaluation, and the importance of remaining in the ED until their evaluation is complete.  Left calf pain.  Ultrasound ordered.  Pain meds ordered   Margarita Mail, PA-C 07/27/21 1021    Isla Pence, MD 07/27/21 1047

## 2021-07-27 NOTE — ED Triage Notes (Signed)
Pt presents with c/o left leg pain. Pt reports the pain is in his calf and down to his left foot. Pt denies any injury, reports that the pain started yesterday.

## 2021-08-11 ENCOUNTER — Inpatient Hospital Stay (HOSPITAL_COMMUNITY)
Admission: EM | Admit: 2021-08-11 | Discharge: 2021-08-15 | DRG: 637 | Disposition: A | Discharge status: Home / Self Care | Payer: Medicare HMO | Attending: Internal Medicine | Admitting: Internal Medicine

## 2021-08-11 ENCOUNTER — Encounter (HOSPITAL_COMMUNITY): Payer: Self-pay

## 2021-08-11 ENCOUNTER — Emergency Department (HOSPITAL_COMMUNITY): Payer: Medicare HMO

## 2021-08-11 ENCOUNTER — Other Ambulatory Visit: Payer: Self-pay

## 2021-08-11 DIAGNOSIS — Z8042 Family history of malignant neoplasm of prostate: Secondary | ICD-10-CM

## 2021-08-11 DIAGNOSIS — R739 Hyperglycemia, unspecified: Secondary | ICD-10-CM

## 2021-08-11 DIAGNOSIS — I825Y2 Chronic embolism and thrombosis of unspecified deep veins of left proximal lower extremity: Secondary | ICD-10-CM

## 2021-08-11 DIAGNOSIS — Z91199 Patient's noncompliance with other medical treatment and regimen due to unspecified reason: Secondary | ICD-10-CM | POA: Diagnosis not present

## 2021-08-11 DIAGNOSIS — Z91013 Allergy to seafood: Secondary | ICD-10-CM

## 2021-08-11 DIAGNOSIS — Z5900 Homelessness unspecified: Secondary | ICD-10-CM

## 2021-08-11 DIAGNOSIS — E785 Hyperlipidemia, unspecified: Secondary | ICD-10-CM | POA: Diagnosis present

## 2021-08-11 DIAGNOSIS — Z8371 Family history of colonic polyps: Secondary | ICD-10-CM

## 2021-08-11 DIAGNOSIS — Z800 Family history of malignant neoplasm of digestive organs: Secondary | ICD-10-CM | POA: Diagnosis not present

## 2021-08-11 DIAGNOSIS — E1110 Type 2 diabetes mellitus with ketoacidosis without coma: Principal | ICD-10-CM | POA: Diagnosis present

## 2021-08-11 DIAGNOSIS — F32A Depression, unspecified: Secondary | ICD-10-CM | POA: Diagnosis present

## 2021-08-11 DIAGNOSIS — I2510 Atherosclerotic heart disease of native coronary artery without angina pectoris: Secondary | ICD-10-CM | POA: Diagnosis present

## 2021-08-11 DIAGNOSIS — Z87442 Personal history of urinary calculi: Secondary | ICD-10-CM | POA: Diagnosis not present

## 2021-08-11 DIAGNOSIS — F17210 Nicotine dependence, cigarettes, uncomplicated: Secondary | ICD-10-CM | POA: Diagnosis present

## 2021-08-11 DIAGNOSIS — I959 Hypotension, unspecified: Secondary | ICD-10-CM | POA: Diagnosis present

## 2021-08-11 DIAGNOSIS — F0394 Unspecified dementia, unspecified severity, with anxiety: Secondary | ICD-10-CM | POA: Diagnosis present

## 2021-08-11 DIAGNOSIS — D72829 Elevated white blood cell count, unspecified: Secondary | ICD-10-CM

## 2021-08-11 DIAGNOSIS — Z20822 Contact with and (suspected) exposure to covid-19: Secondary | ICD-10-CM | POA: Diagnosis present

## 2021-08-11 DIAGNOSIS — Z955 Presence of coronary angioplasty implant and graft: Secondary | ICD-10-CM

## 2021-08-11 DIAGNOSIS — F1010 Alcohol abuse, uncomplicated: Secondary | ICD-10-CM | POA: Diagnosis present

## 2021-08-11 DIAGNOSIS — T383X6A Underdosing of insulin and oral hypoglycemic [antidiabetic] drugs, initial encounter: Secondary | ICD-10-CM | POA: Diagnosis present

## 2021-08-11 DIAGNOSIS — Z86718 Personal history of other venous thrombosis and embolism: Secondary | ICD-10-CM

## 2021-08-11 DIAGNOSIS — I5042 Chronic combined systolic (congestive) and diastolic (congestive) heart failure: Secondary | ICD-10-CM | POA: Diagnosis present

## 2021-08-11 DIAGNOSIS — Z811 Family history of alcohol abuse and dependence: Secondary | ICD-10-CM | POA: Diagnosis not present

## 2021-08-11 DIAGNOSIS — N400 Enlarged prostate without lower urinary tract symptoms: Secondary | ICD-10-CM | POA: Diagnosis present

## 2021-08-11 DIAGNOSIS — K219 Gastro-esophageal reflux disease without esophagitis: Secondary | ICD-10-CM | POA: Diagnosis present

## 2021-08-11 DIAGNOSIS — Z7901 Long term (current) use of anticoagulants: Secondary | ICD-10-CM

## 2021-08-11 DIAGNOSIS — R059 Cough, unspecified: Secondary | ICD-10-CM | POA: Diagnosis present

## 2021-08-11 DIAGNOSIS — Z79899 Other long term (current) drug therapy: Secondary | ICD-10-CM

## 2021-08-11 DIAGNOSIS — I82409 Acute embolism and thrombosis of unspecified deep veins of unspecified lower extremity: Secondary | ICD-10-CM

## 2021-08-11 DIAGNOSIS — Z7984 Long term (current) use of oral hypoglycemic drugs: Secondary | ICD-10-CM

## 2021-08-11 DIAGNOSIS — J189 Pneumonia, unspecified organism: Secondary | ICD-10-CM | POA: Diagnosis present

## 2021-08-11 DIAGNOSIS — I480 Paroxysmal atrial fibrillation: Secondary | ICD-10-CM | POA: Diagnosis present

## 2021-08-11 DIAGNOSIS — I252 Old myocardial infarction: Secondary | ICD-10-CM | POA: Diagnosis not present

## 2021-08-11 DIAGNOSIS — F4381 Prolonged grief disorder: Secondary | ICD-10-CM | POA: Diagnosis present

## 2021-08-11 DIAGNOSIS — Z9114 Patient's other noncompliance with medication regimen: Secondary | ICD-10-CM

## 2021-08-11 DIAGNOSIS — F4325 Adjustment disorder with mixed disturbance of emotions and conduct: Secondary | ICD-10-CM | POA: Diagnosis not present

## 2021-08-11 DIAGNOSIS — E1310 Other specified diabetes mellitus with ketoacidosis without coma: Secondary | ICD-10-CM

## 2021-08-11 LAB — LACTIC ACID, PLASMA: Lactic Acid, Venous: 1.7 mmol/L (ref 0.5–1.9)

## 2021-08-11 LAB — COMPREHENSIVE METABOLIC PANEL
ALT: 15 U/L (ref 0–44)
AST: 15 U/L (ref 15–41)
Albumin: 4 g/dL (ref 3.5–5.0)
Alkaline Phosphatase: 95 U/L (ref 38–126)
Anion gap: 15 (ref 5–15)
BUN: 19 mg/dL (ref 8–23)
CO2: 22 mmol/L (ref 22–32)
Calcium: 9.4 mg/dL (ref 8.9–10.3)
Chloride: 89 mmol/L — ABNORMAL LOW (ref 98–111)
Creatinine, Ser: 1 mg/dL (ref 0.61–1.24)
GFR, Estimated: >60 mL/min (ref >60)
Glucose, Bld: 737 mg/dL (ref 70–99)
Potassium: 4.8 mmol/L (ref 3.5–5.1)
Sodium: 126 mmol/L — ABNORMAL LOW (ref 135–145)
Total Bilirubin: 1.1 mg/dL (ref 0.3–1.2)
Total Protein: 7.5 g/dL (ref 6.5–8.1)

## 2021-08-11 LAB — RAPID URINE DRUG SCREEN, HOSP PERFORMED
Amphetamines: NOT DETECTED
Barbiturates: NOT DETECTED
Benzodiazepines: NOT DETECTED
Cocaine: NOT DETECTED
Opiates: NOT DETECTED
Tetrahydrocannabinol: NOT DETECTED

## 2021-08-11 LAB — PROCALCITONIN: Procalcitonin: <0.1 ng/mL

## 2021-08-11 LAB — CBG MONITORING, ED
Glucose-Capillary: >600 mg/dL (ref 70–99)
Glucose-Capillary: 460 mg/dL — ABNORMAL HIGH (ref 70–99)
Glucose-Capillary: 372 mg/dL — ABNORMAL HIGH (ref 70–99)
Glucose-Capillary: 402 mg/dL — ABNORMAL HIGH (ref 70–99)
Glucose-Capillary: 219 mg/dL — ABNORMAL HIGH (ref 70–99)
Glucose-Capillary: 211 mg/dL — ABNORMAL HIGH (ref 70–99)

## 2021-08-11 LAB — URINALYSIS, ROUTINE W REFLEX MICROSCOPIC
Bacteria, UA: NONE SEEN
Bilirubin Urine: NEGATIVE
Glucose, UA: >=500 mg/dL — AB
Hgb urine dipstick: NEGATIVE
Ketones, ur: 5 mg/dL — AB
Leukocytes,Ua: NEGATIVE
Nitrite: NEGATIVE
Protein, ur: NEGATIVE mg/dL
Specific Gravity, Urine: 1.029 (ref 1.005–1.030)
pH: 5 (ref 5.0–8.0)

## 2021-08-11 LAB — BLOOD GAS, VENOUS
Acid-base deficit: 0.9 mmol/L (ref 0.0–2.0)
Bicarbonate: 25.1 mmol/L (ref 20.0–28.0)
O2 Saturation: 25 %
Patient temperature: 98.6
pCO2, Ven: 48.8 mmHg (ref 44.0–60.0)
pH, Ven: 7.332 (ref 7.250–7.430)
pO2, Ven: <31 mmHg — CL (ref 32.0–45.0)

## 2021-08-11 LAB — BASIC METABOLIC PANEL
Anion gap: 14 (ref 5–15)
BUN: 16 mg/dL (ref 8–23)
CO2: 24 mmol/L (ref 22–32)
Calcium: 9.2 mg/dL (ref 8.9–10.3)
Chloride: 92 mmol/L — ABNORMAL LOW (ref 98–111)
Creatinine, Ser: 0.85 mg/dL (ref 0.61–1.24)
GFR, Estimated: >60 mL/min (ref >60)
Glucose, Bld: 455 mg/dL — ABNORMAL HIGH (ref 70–99)
Potassium: 4.5 mmol/L (ref 3.5–5.1)
Sodium: 130 mmol/L — ABNORMAL LOW (ref 135–145)

## 2021-08-11 LAB — CBC WITH DIFFERENTIAL/PLATELET
Abs Immature Granulocytes: 0 10*3/uL (ref 0.00–0.07)
Basophils Absolute: 0 10*3/uL (ref 0.0–0.1)
Basophils Relative: 0 %
Eosinophils Absolute: 0.2 10*3/uL (ref 0.0–0.5)
Eosinophils Relative: 1 %
HCT: 48.9 % (ref 39.0–52.0)
Hemoglobin: 16.6 g/dL (ref 13.0–17.0)
Lymphocytes Relative: 36 %
Lymphs Abs: 7.6 10*3/uL — ABNORMAL HIGH (ref 0.7–4.0)
MCH: 31.3 pg (ref 26.0–34.0)
MCHC: 33.9 g/dL (ref 30.0–36.0)
MCV: 92.3 fL (ref 80.0–100.0)
Monocytes Absolute: 0.2 10*3/uL (ref 0.1–1.0)
Monocytes Relative: 1 %
Neutro Abs: 13 10*3/uL — ABNORMAL HIGH (ref 1.7–7.7)
Neutrophils Relative %: 62 %
Platelet Morphology: NORMAL
Platelets: 385 10*3/uL (ref 150–400)
RBC: 5.3 MIL/uL (ref 4.22–5.81)
RDW: 13.3 % (ref 11.5–15.5)
WBC: 21 10*3/uL — ABNORMAL HIGH (ref 4.0–10.5)
nRBC: 0 % (ref 0.0–0.2)

## 2021-08-11 LAB — MAGNESIUM: Magnesium: 2.3 mg/dL (ref 1.7–2.4)

## 2021-08-11 LAB — RESP PANEL BY RT-PCR (FLU A&B, COVID) ARPGX2
Influenza A by PCR: NEGATIVE
Influenza B by PCR: NEGATIVE
SARS Coronavirus 2 by RT PCR: NEGATIVE

## 2021-08-11 LAB — TSH: TSH: 0.794 u[IU]/mL (ref 0.350–4.500)

## 2021-08-11 LAB — BETA-HYDROXYBUTYRIC ACID
Beta-Hydroxybutyric Acid: 3.13 mmol/L — ABNORMAL HIGH (ref 0.05–0.27)
Beta-Hydroxybutyric Acid: 3.04 mmol/L — ABNORMAL HIGH (ref 0.05–0.27)

## 2021-08-11 LAB — ETHANOL: Alcohol, Ethyl (B): <10 mg/dL (ref <10)

## 2021-08-11 LAB — AMMONIA: Ammonia: 22 umol/L (ref 9–35)

## 2021-08-11 LAB — LIPASE, BLOOD: Lipase: 58 U/L — ABNORMAL HIGH (ref 11–51)

## 2021-08-11 MED ORDER — LACTATED RINGERS IV SOLN: INTRAVENOUS | Status: DC

## 2021-08-11 MED ORDER — DEXTROSE IN LACTATED RINGERS 5 % IV SOLN: INTRAVENOUS | Status: DC

## 2021-08-11 MED ORDER — SODIUM CHLORIDE 0.9 % IV BOLUS
1000.0000 mL | Freq: Once | INTRAVENOUS | Status: AC
  Administered 2021-08-11: 1000 mL via INTRAVENOUS

## 2021-08-11 MED ORDER — SODIUM CHLORIDE 0.9 % IV SOLN
500.0000 mg | Freq: Once | INTRAVENOUS | Status: AC
  Administered 2021-08-11: 500 mg via INTRAVENOUS
  Filled 2021-08-11: qty 500

## 2021-08-11 MED ORDER — SODIUM CHLORIDE 0.9 % IV SOLN
1.0000 g | Freq: Once | INTRAVENOUS | Status: AC
  Administered 2021-08-11: 1 g via INTRAVENOUS
  Filled 2021-08-11: qty 10

## 2021-08-11 MED ORDER — POTASSIUM CHLORIDE 10 MEQ/100ML IV SOLN
10.0000 meq | INTRAVENOUS | Status: AC
  Administered 2021-08-11 (×2): 10 meq via INTRAVENOUS
  Filled 2021-08-11 (×2): qty 100

## 2021-08-11 MED ORDER — CHLORHEXIDINE GLUCONATE CLOTH 2 % EX PADS
6.0000 | MEDICATED_PAD | Freq: Every day | CUTANEOUS | Status: DC
  Administered 2021-08-11 – 2021-08-15 (×4): 6 via TOPICAL

## 2021-08-11 MED ORDER — GABAPENTIN 300 MG PO CAPS
600.0000 mg | ORAL_CAPSULE | Freq: Once | ORAL | Status: AC
  Administered 2021-08-11: 600 mg via ORAL
  Filled 2021-08-11: qty 2

## 2021-08-11 MED ORDER — INSULIN REGULAR(HUMAN) IN NACL 100-0.9 UT/100ML-% IV SOLN
INTRAVENOUS | Status: DC
  Administered 2021-08-11: 8 [IU]/h via INTRAVENOUS
  Filled 2021-08-11: qty 100

## 2021-08-11 MED ORDER — DEXTROSE 50 % IV SOLN: 0.0000 mL | INTRAVENOUS | Status: DC | PRN

## 2021-08-11 NOTE — H&P (Signed)
History and Physical    Barry Mccoy NTI:[id number] DOB: Mar 08, 1951 DOA: 08/11/2021  PCP: System, Provider Not In  Patient coming from: Home  I have personally briefly reviewed patient's old medical records in [place]  Chief Complaint: unwell  HPI: Barry Mccoy is a 70 y.o. male with medical history significant for CAD STEMI, type 2 diabetes, paroxysmal atrial fibrillation, recent left lower extremity DVT on Eliquis, MGUS, history of GI bleed, migraine and hyperlipidemia who presents with concerns of generalized malaise.  Patient just reports that he has not been feeling well for the past couple days.  Legs feel heavy.  Has been feeling thirsty but denies any dysuria, urgency or frequency.  He was recently diagnosed with extensive left lower extremity DVT on 11/17 and started on Eliquis.  Reports compliance.  Denies any cough, shortness of breath.  No nausea, vomiting, diarrhea or abdominal pain. He reports drinking "too much" at least 2 to 3 cans of Coke daily  ED Course: He was afebrile, initially hypotensive with BP of 75/66 that improved with fluids.  WBC of 21, normal lactate of 1.7.  Sodium of 126, K of 4.8, creatinine of 1, BG of 737. pH of 7.332, bicarb 25 with anion gap of 15.  Beta hydroxybutyrate 3.13.  UA was negative.  TSH was normal.  Chest x-ray concerning for artifact versus infiltrate in the left lower lobe.  He was started presumptively on Rocephin and azithromycin for pneumonia.  Also started on IV insulin infusion.  Hospitalist then called for admission.  Review of Systems: Constitutional: No Weight Change, No Fever ENT/Mouth: No sore throat, No Rhinorrhea Eyes: No Vision Changes Cardiovascular: No Chest Pain, no SOB Respiratory: No Cough, No Sputum  Gastrointestinal: No Nausea, No Vomiting, No Diarrhea, No Constipation, No Pain Genitourinary: no Urinary Incontinence Musculoskeletal: No Arthralgias, No Myalgias Skin: No Skin Lesions, No Pruritus, Neuro: +  Weakness, No Numbness Psych: no decrease appetite Heme/Lymph: No Bruising, No Bleeding  Social Pt is retired.  Currently moving into apartment in [place].  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  Reports half a pack per day of tobacco use.  Occasional alcohol use.  Denies illicit drug use.  Past Medical History:  Diagnosis Date   Anxiety    BPH (benign prostatic hyperplasia)    Cervical spine disease    Chronic kidney disease    kidney stones   Depression    Disorder of lumbar spine    ETOH abuse    GI bleed    Muscle spasm     Past Surgical History:  Procedure Laterality Date   COLONOSCOPY WITH PROPOFOL N/A 11/08/2016   Procedure: COLONOSCOPY WITH PROPOFOL;  Surgeon: Wilford Corner, MD;  Location: [facility];  Service: Endoscopy;  Laterality: N/A;   CORONARY STENT INTERVENTION N/A 04/25/2021   Procedure: CORONARY STENT INTERVENTION;  Surgeon: Martinique, Peter M, MD;  Location: [facility];  Service: Cardiovascular;  Laterality: N/A;  RCA   ELBOW SURGERY     as child   ELBOW SURGERY     ENTEROSCOPY N/A 11/19/2016   Procedure: ENTEROSCOPY;  Surgeon: Wonda Horner, MD;  Location: [facility];  Service: Endoscopy;  Laterality: N/A;   ESOPHAGOGASTRODUODENOSCOPY N/A 10/04/2016   Procedure: ESOPHAGOGASTRODUODENOSCOPY (EGD);  Surgeon: Carol Ada, MD;  Location: [facility];  Service: Gastroenterology;  Laterality: N/A;   GIVENS CAPSULE STUDY N/A 11/09/2016   Procedure: GIVENS CAPSULE STUDY;  Surgeon: Wilford Corner, MD;  Location: [facility];  Service:  Endoscopy;  Laterality: N/A;   LEFT HEART CATH AND CORONARY ANGIOGRAPHY N/A 04/25/2021   Procedure: LEFT HEART CATH AND CORONARY ANGIOGRAPHY;  Surgeon: Martinique, Peter M, MD;  Location: [facility];  Service: Cardiovascular;  Laterality: N/A;     reports that he has been smoking cigarettes. He has a 50.00 pack-year smoking history. He has never used smokeless tobacco. He reports that he does not  currently use drugs after having used the following drugs: Marijuana. He reports that he does not drink alcohol. Social History  Allergies  Allergen Reactions   Shellfish Allergy Other (See Comments)    Unknown     Family History  Problem Relation Age of Onset   Colon cancer Brother    Alcohol abuse Brother    Cancer Brother    Early death Brother    Prostate cancer Brother    Colon polyps Brother    Cancer Mother 15       colon cancer   Colon cancer Mother      Prior to Admission medications   Medication Sig Start Date End Date Taking? Authorizing Provider  acetaminophen (TYLENOL) 325 MG tablet Take 2 tablets (650 mg total) by mouth every 6 (six) hours as needed for mild pain, moderate pain or headache (or Fever >/= 101). 10/27/16   Johnson, Clanford L, MD  APIXABAN Arne Cleveland) VTE STARTER PACK (10MG  AND 5MG ) Take as directed on package: start with two-5mg  tablets twice daily for 7 days. On day 8, switch to one-5mg  tablet twice daily. 07/27/21   Jacqlyn Larsen, PA-C  atorvastatin (LIPITOR) 80 MG tablet Take 1 tablet (80 mg total) by mouth daily. 04/28/21   Bhagat, Crista Luria, PA  Brimonidine Tartrate (LUMIFY) 0.025 % SOLN Place 1 drop into both eyes daily as needed (redness).    [provider]  Cholecalciferol (VITAMIN D-3) 5000 units TABS Take by mouth daily.    [provider]  fenofibrate 160 MG tablet Take 1 tablet (160 mg total) by mouth daily. 04/28/21   Bhagat, Crista Luria, PA  folic acid (FOLVITE) 1 MG tablet Take 1 tablet (1 mg total) by mouth daily. 10/28/16   Johnson, Clanford L, MD  gabapentin (NEURONTIN) 300 MG capsule Take 300 mg by mouth 3 (three) times daily.    [provider]  iron polysaccharides (NIFEREX) 150 MG capsule Take 1 capsule (150 mg total) by mouth 2 (two) times daily. 11/21/16   Elgergawy, Silver Huguenin, MD  lidocaine (LIDODERM) 5 % Place 1 patch onto the skin every 12 (twelve) hours as needed (pain). 09/12/16   [provider]   metFORMIN (GLUCOPHAGE) 500 MG tablet Take 1 tablet (500 mg total) by mouth 2 (two) times daily with a meal. Patient not taking: Reported on 08/11/2021 04/27/21 04/27/22  Leanor Kail, PA  metoprolol succinate (TOPROL-XL) 25 MG 24 hr tablet Take 1 tablet (25 mg total) by mouth daily. 04/28/21   Leanor Kail, PA  Multiple Vitamin (MULTIVITAMIN WITH MINERALS) TABS tablet Take 1 tablet by mouth at bedtime.    [provider]  nitroGLYCERIN (NITROSTAT) 0.4 MG SL tablet Place 1 tablet (0.4 mg total) under the tongue every 5 (five) minutes x 3 doses as needed for chest pain. 04/27/21   Bhagat, Crista Luria, PA  oxybutynin (DITROPAN-XL) 5 MG 24 hr tablet Take 1 tablet by mouth daily. 01/13/20   [provider]  pantoprazole (PROTONIX) 40 MG tablet Take 1 tablet (40 mg total) by mouth daily. 04/27/21   Leanor Kail, PA  QUEtiapine (SEROQUEL) 50 MG tablet TAKE 1 TABLET (50 MG TOTAL) BY MOUTH AT BEDTIME. 11/26/16   Dixon, Lonie Peak, PA-C  sacubitril-valsartan (ENTRESTO) 24-26 MG Take 1 tablet by mouth 2 (two) times daily. 05/03/21   Katherine Roan, MD  sertraline (ZOLOFT) 50 MG tablet TAKE 1 TABLET BY MOUTH AT BEDTIME Patient not taking: Reported on 08/11/2021 11/26/16   Dena Billet B, PA-C  tamsulosin (FLOMAX) 0.4 MG CAPS capsule TAKE ONE CAPSULE BY MOUTH AT BEDTIME Patient not taking: Reported on 08/11/2021 11/26/16   [name], PA-C  thiamine 100 MG tablet Take 1 tablet (100 mg total) by mouth daily. For 4days then down to 100mg  daily Patient taking differently: Take 100 mg by mouth 3 (three) times daily. 10/27/16   Johnson, Clanford L, MD  traMADol (ULTRAM) 50 MG tablet Take 1 tablet (50 mg total) by mouth every 6 (six) hours as needed. 07/27/21   Margarita Mail, PA-C  traZODone (DESYREL) 100 MG tablet Take 1 tablet (100 mg total) by mouth at bedtime. 10/27/16   Johnson, Clanford L, MD  vitamin B-12 1000 MCG tablet Take 1 tablet (1,000 mcg total) by mouth daily. 11/22/16    Elgergawy, Silver Huguenin, MD    Physical Exam: Vitals:   08/11/21 1715 08/11/21 1800 08/11/21 1806 08/11/21 1830  BP: 93/63 113/66  111/90  Pulse: 74 70  76  Resp: 18   15  Temp:   98.7 F (37.1 C)   TempSrc:   Oral   SpO2: 96% 100%  96%  Weight:      Height:        Constitutional: NAD, calm, comfortable, non-toxic appearing elderly male laying in bed Vitals:   08/11/21 1715 08/11/21 1800 08/11/21 1806 08/11/21 1830  BP: 93/63 113/66  111/90  Pulse: 74 70  76  Resp: 18   15  Temp:   98.7 F (37.1 C)   TempSrc:   Oral   SpO2: 96% 100%  96%  Weight:      Height:       Eyes: PERRL, lids and conjunctivae normal ENMT: Mucous membranes are moist.  Neck: normal, supple Respiratory: clear to auscultation bilaterally, no wheezing, no crackles. Normal respiratory effort. No accessory muscle use.  Cardiovascular: Regular rate and rhythm, no murmurs / rubs / gallops. No extremity edema. 2+ pedal pulses.  Abdomen: no tenderness, no masses palpated.Bowel sounds positive.  Musculoskeletal: no clubbing / cyanosis. No joint deformity upper and lower extremities. Good ROM, no contractures. Normal muscle tone.  Skin: no rashes, lesions, ulcers. No induration Neurologic: CN 2-12 grossly intact. Strength 5/5 in all 4.  Psychiatric: Normal judgment and insight. Alert and oriented x 3. Normal mood.    Labs on Admission: I have personally reviewed following labs and imaging studies  CBC: Recent Labs  Lab 08/11/21 1558  WBC 21.0*  NEUTROABS 13.0*  HGB 16.6  HCT 48.9  MCV 92.3  PLT 010   Basic Metabolic Panel: Recent Labs  Lab 08/11/21 1558 08/11/21 1614  NA 126*  --   K 4.8  --   CL 89*  --   CO2 22  --   GLUCOSE 737*  --   BUN 19  --   CREATININE 1.00  --   CALCIUM 9.4  --   MG  --  2.3   GFR: Estimated Creatinine Clearance: 81.1 mL/min (by C-G formula based on SCr of 1 mg/dL). Liver Function Tests: Recent Labs  Lab 08/11/21 1558  AST 15  ALT 15  ALKPHOS 95  BILITOT  1.1  PROT 7.5  ALBUMIN 4.0   Recent Labs  Lab 08/11/21 1614  LIPASE 58*   Recent Labs  Lab 08/11/21 1616  AMMONIA 22   Coagulation Profile: No results for input(s): INR, PROTIME in the last 168 hours. Cardiac Enzymes: No results for input(s): CKTOTAL, CKMB, CKMBINDEX, TROPONINI in the last 168 hours. BNP (last 3 results) No results for input(s): PROBNP in the last 8760 hours. HbA1C: No results for input(s): HGBA1C in the last 72 hours. CBG: Recent Labs  Lab 08/11/21 1612 08/11/21 1948  GLUCAP >600* 460*   Lipid Profile: No results for input(s): CHOL, HDL, LDLCALC, TRIG, CHOLHDL, LDLDIRECT in the last 72 hours. Thyroid Function Tests: Recent Labs    08/11/21 1613  TSH 0.794   Anemia Panel: No results for input(s): VITAMINB12, FOLATE, FERRITIN, TIBC, IRON, RETICCTPCT in the last 72 hours. Urine analysis:    Component Value Date/Time   COLORURINE STRAW (A) 08/11/2021 1613   APPEARANCEUR CLEAR 08/11/2021 1613   LABSPEC 1.029 08/11/2021 1613   PHURINE 5.0 08/11/2021 1613   GLUCOSEU >=500 (A) 08/11/2021 1613   HGBUR NEGATIVE 08/11/2021 1613   BILIRUBINUR NEGATIVE 08/11/2021 1613   KETONESUR 5 (A) 08/11/2021 1613   PROTEINUR NEGATIVE 08/11/2021 1613   NITRITE NEGATIVE 08/11/2021 1613   LEUKOCYTESUR NEGATIVE 08/11/2021 1613    Radiological Exams on Admission: DG Chest Portable 1 View  Result Date: 08/11/2021 CLINICAL DATA:  Fatigue, hyperglycemia EXAM: PORTABLE CHEST 1 VIEW COMPARISON:  06/24/2021 FINDINGS: Cardiac size is within normal limits. Mediastinum is unremarkable. There are no signs of alveolar pulmonary edema or focal pulmonary consolidation. There is faint haziness with air soft tissue interface in the left lower lung fields. Lung markings are seen extending lateral to this interface. Costophrenic angles are clear. There is no definite pneumothorax. There are linear densities in both apices, possibly scarring. IMPRESSION: There are no signs of pulmonary  edema or focal pulmonary consolidation. There is small faint radiopacity with air soft tissue interface in the its lateral margin. This may suggest an artifact such as skin fold or early infiltrate in the left lower lung fields. Short-term follow-up PA and lateral views of chest may be considered. Electronically Signed   By: Elmer Picker M.D.   On: 08/11/2021 16:45      Assessment/Plan  DKA -Last hemoglobin A1c in 04/2021 of 7.9 - replete potassium - continue insulin gtt with goal of 140-180 and AG <12 - IV NS until BG <250, then switch to D5 1/2 NS  - BMP q4hr  - keep NPO   Hypotension Presented with BP of 75/66.  Has resolved with aggressive fluid resuscitation   Questionable community-acquired pneumonia - Chest x-ray shows possible artifact versus infiltrate in the left lower lobe.  However does not have any upper respiratory symptoms. Check procalcitonin before continuing antibiotics  Leukocytosis Likely reactive.  Checking procalcitonin as above  Left lower extremity DVT Diagnosed on 07/27/2021 with extensive DVT burden. Continue Eliquis  Paroxysmal atrial fibrillation Currently on Eliquis for DVT Likely not on anticoagulation before due to history of GI bleed  DVT prophylaxis:.Eliquis Code Status: Full Family Communication: Plan discussed with patient at bedside  disposition Plan: Home with at least 2 midnight stays  Consults called:  Admission status: inpatient  Level of care: Stepdown  Status is: Inpatient  Remains inpatient appropriate because: DKA requiring IV insulin infusion         Orene Desanctis DO [facility]  If 7PM-7AM, please contact night-coverage [email or web address]   08/11/2021, 8:09 PM

## 2021-08-11 NOTE — ED Triage Notes (Signed)
Patient reports that he went to an UC because his "sugar was high". Patient states his sugar was >600.

## 2021-08-11 NOTE — ED Notes (Signed)
Pt complained of itching on his penis. Pt states he hasn't bathed in several days. Pt provided with soap, wash cloths and towels and is now standing at sink washing.

## 2021-08-11 NOTE — ED Provider Notes (Signed)
[facility] DEPT Provider Note   CSN: [id number] Arrival date & time: 08/11/21  1505     History Chief Complaint  Patient presents with   Hyperglycemia    Crit Obremski is a 70 y.o. male.  The history is provided by the patient and medical records. No language interpreter was used.  Hyperglycemia Blood sugar level PTA:  >600 Severity:  Severe Onset quality:  Gradual Duration:  3 days Timing:  Unable to specify Progression:  Unchanged Chronicity:  New Current diabetic therapy:  Not taking insulin Context: noncompliance   Relieved by:  Nothing Ineffective treatments:  None tried Associated symptoms: dehydration, fatigue, increased thirst, malaise, nausea and vomiting   Associated symptoms: no abdominal pain, no altered mental status, no chest pain, no confusion, no dysuria, no fever and no shortness of breath   Risk factors: no hx of DKA       Past Medical History:  Diagnosis Date   Anxiety    BPH (benign prostatic hyperplasia)    Cervical spine disease    Chronic kidney disease    kidney stones   Depression    Disorder of lumbar spine    ETOH abuse    GI bleed    Muscle spasm     Patient Active Problem List   Diagnosis Date Noted   STEMI involving right coronary artery ([place]) 04/25/2021   Type 2 diabetes mellitus without complication, without long-term current use of insulin ([place]) 04/25/2021   Acute pancreatitis    GI bleed 11/06/2016   Acute blood loss anemia 11/06/2016   Nausea with vomiting 11/06/2016   Alcohol abuse 11/06/2016   UTI (urinary tract infection) 10/23/2016   Urinary retention 10/23/2016   Positive D dimer    Severe single current episode of major depressive disorder, with psychotic features (HCC)    Normocytic anemia 10/01/2016   Acute GI bleeding 10/01/2016   Grief reaction 04/19/2016   Depression 04/19/2016   Benign prostatic hyperplasia 04/19/2016   Testosterone deficiency 10/26/2015   Vitamin D  deficiency 10/21/2015   Tobacco abuse 10/17/2015   Chronic pain syndrome 10/17/2015   Anxiety    Cervical spine disease    Disorder of lumbar spine     Past Surgical History:  Procedure Laterality Date   COLONOSCOPY WITH PROPOFOL N/A 11/08/2016   Procedure: COLONOSCOPY WITH PROPOFOL;  Surgeon: Wilford Corner, MD;  Location: [facility];  Service: Endoscopy;  Laterality: N/A;   CORONARY STENT INTERVENTION N/A 04/25/2021   Procedure: CORONARY STENT INTERVENTION;  Surgeon: Martinique, Peter M, MD;  Location: [facility];  Service: Cardiovascular;  Laterality: N/A;  RCA   ELBOW SURGERY     as child   ELBOW SURGERY     ENTEROSCOPY N/A 11/19/2016   Procedure: ENTEROSCOPY;  Surgeon: Wonda Horner, MD;  Location: [facility];  Service: Endoscopy;  Laterality: N/A;   ESOPHAGOGASTRODUODENOSCOPY N/A 10/04/2016   Procedure: ESOPHAGOGASTRODUODENOSCOPY (EGD);  Surgeon: Carol Ada, MD;  Location: [facility];  Service: Gastroenterology;  Laterality: N/A;   GIVENS CAPSULE STUDY N/A 11/09/2016   Procedure: GIVENS CAPSULE STUDY;  Surgeon: Wilford Corner, MD;  Location: [facility];  Service: Endoscopy;  Laterality: N/A;   LEFT HEART CATH AND CORONARY ANGIOGRAPHY N/A 04/25/2021   Procedure: LEFT HEART CATH AND CORONARY ANGIOGRAPHY;  Surgeon: Martinique, Peter M, MD;  Location: [facility];  Service: Cardiovascular;  Laterality: N/A;       Family History  Problem Relation Age of Onset   Colon cancer  Brother    Alcohol abuse Brother    Cancer Brother    Early death Brother    Prostate cancer Brother    Colon polyps Brother    Cancer Mother 41       colon cancer   Colon cancer Mother     Social History   Tobacco Use   Smoking status: Every Day    Packs/day: 1.00    Years: 50.00    Pack years: 50.00    Types: Cigarettes   Smokeless tobacco: Never   Tobacco comments:    attempting to quit- down to 8 a day   Vaping Use   Vaping Use: Never used  Substance Use Topics   Alcohol  use: No    Comment: 2 drinks a month   Drug use: Not Currently    Types: Marijuana    Home Medications Prior to Admission medications   Medication Sig Start Date End Date Taking? Authorizing Provider  acetaminophen (TYLENOL) 325 MG tablet Take 2 tablets (650 mg total) by mouth every 6 (six) hours as needed for mild pain, moderate pain or headache (or Fever >/= 101). Patient not taking: No sig reported 10/27/16   Murlean Iba, MD  APIXABAN Arne [place]) VTE STARTER PACK (10MG  AND 5MG ) Take as directed on package: start with two-5mg  tablets twice daily for 7 days. On day 8, switch to one-5mg  tablet twice daily. 07/27/21   Jacqlyn Larsen, PA-C  atorvastatin (LIPITOR) 80 MG tablet Take 1 tablet (80 mg total) by mouth daily. 04/28/21   Bhagat, Crista Luria, PA  Brimonidine Tartrate (LUMIFY) 0.025 % SOLN Place 1 drop into both eyes daily as needed (redness).    [provider]  Cholecalciferol (VITAMIN D-3) 5000 units TABS Take 50,000 Units by mouth daily.    [provider]  fenofibrate 160 MG tablet Take 1 tablet (160 mg total) by mouth daily. 04/28/21   Bhagat, Crista Luria, PA  folic acid (FOLVITE) 1 MG tablet Take 1 tablet (1 mg total) by mouth daily. Patient not taking: No sig reported 10/28/16   Irwin Brakeman L, MD  gabapentin (NEURONTIN) 300 MG capsule Take 300 mg by mouth 3 (three) times daily. Patient not taking: Reported on 05/03/2021    [provider]  iron polysaccharides (NIFEREX) 150 MG capsule Take 1 capsule (150 mg total) by mouth 2 (two) times daily. Patient not taking: Reported on 05/03/2021 11/21/16   Elgergawy, Silver Huguenin, MD  lidocaine (LIDODERM) 5 % Place 1 patch onto the skin every 12 (twelve) hours as needed (pain).  Patient not taking: Reported on 05/03/2021 09/12/16   [provider]  metFORMIN (GLUCOPHAGE) 500 MG tablet Take 1 tablet (500 mg total) by mouth 2 (two) times daily with a meal. 04/27/21 04/27/22  Bhagat, Crista Luria, PA   metoprolol succinate (TOPROL-XL) 25 MG 24 hr tablet Take 1 tablet (25 mg total) by mouth daily. 04/28/21   Leanor Kail, PA  Multiple Vitamin (MULTIVITAMIN WITH MINERALS) TABS tablet Take 1 tablet by mouth at bedtime. Patient not taking: Reported on 05/03/2021    [provider]  nitroGLYCERIN (NITROSTAT) 0.4 MG SL tablet Place 1 tablet (0.4 mg total) under the tongue every 5 (five) minutes x 3 doses as needed for chest pain. 04/27/21   Bhagat, Crista Luria, PA  oxybutynin (DITROPAN-XL) 5 MG 24 hr tablet Take 1 tablet by mouth daily. 01/13/20   [provider]  pantoprazole (PROTONIX) 40 MG tablet Take 1 tablet (40 mg total) by mouth daily. 04/27/21  Bhagat, Bhavinkumar, PA  QUEtiapine (SEROQUEL) 50 MG tablet TAKE 1 TABLET (50 MG TOTAL) BY MOUTH AT BEDTIME. 11/26/16   Dixon, Lonie Peak, PA-C  sacubitril-valsartan (ENTRESTO) 24-26 MG Take 1 tablet by mouth 2 (two) times daily. 05/03/21   Katherine Roan, MD  sertraline (ZOLOFT) 50 MG tablet TAKE 1 TABLET BY MOUTH AT BEDTIME Patient not taking: No sig reported 11/26/16   Dena Billet B, PA-C  tamsulosin (FLOMAX) 0.4 MG CAPS capsule TAKE ONE CAPSULE BY MOUTH AT BEDTIME Patient not taking: Reported on 05/03/2021 11/26/16   [name], PA-C  thiamine 100 MG tablet Take 1 tablet (100 mg total) by mouth daily. For 4days then down to 100mg  daily Patient taking differently: Take 100 mg by mouth 3 (three) times daily. 10/27/16   Johnson, Clanford L, MD  traMADol (ULTRAM) 50 MG tablet Take 1 tablet (50 mg total) by mouth every 6 (six) hours as needed. 07/27/21   Margarita Mail, PA-C  traZODone (DESYREL) 100 MG tablet Take 1 tablet (100 mg total) by mouth at bedtime. Patient not taking: No sig reported 10/27/16   Murlean Iba, MD  vitamin B-12 1000 MCG tablet Take 1 tablet (1,000 mcg total) by mouth daily. 11/22/16   Elgergawy, Silver Huguenin, MD    Allergies    Shellfish allergy  Review of Systems   Review of Systems  Constitutional:   Positive for fatigue. Negative for chills and fever.  HENT:  Negative for congestion.   Respiratory:  Negative for cough, chest tightness, shortness of breath and wheezing.   Cardiovascular:  Negative for chest pain, palpitations and leg swelling.  Gastrointestinal:  Positive for constipation, nausea and vomiting. Negative for abdominal pain and diarrhea.  Endocrine: Positive for polydipsia.  Genitourinary:  Positive for frequency. Negative for dysuria and flank pain.  Musculoskeletal:  Negative for back pain and neck pain.  Skin:  Negative for rash and wound.  Neurological:  Negative for headaches.  Psychiatric/Behavioral:  Negative for agitation and confusion.   All other systems reviewed and are negative.  Physical Exam Updated Vital Signs BP (!) 75/66   Pulse 91   Resp 18   Physical Exam Vitals and nursing note reviewed.  Constitutional:      General: He is not in acute distress.    Appearance: He is well-developed. He is not ill-appearing, toxic-appearing or diaphoretic.  HENT:     Head: Normocephalic and atraumatic.     Nose: No congestion or rhinorrhea.     Mouth/Throat:     Mouth: Mucous membranes are dry.     Pharynx: No oropharyngeal exudate or posterior oropharyngeal erythema.  Eyes:     Extraocular Movements: Extraocular movements intact.     Conjunctiva/sclera: Conjunctivae normal.     Pupils: Pupils are equal, round, and reactive to light.  Cardiovascular:     Rate and Rhythm: Normal rate and regular rhythm.     Heart sounds: No murmur heard. Pulmonary:     Effort: Pulmonary effort is normal. No respiratory distress.     Breath sounds: Normal breath sounds. No wheezing, rhonchi or rales.  Chest:     Chest wall: No tenderness.  Abdominal:     General: Abdomen is flat.     Palpations: Abdomen is soft.     Tenderness: There is no abdominal tenderness. There is no right CVA tenderness, left CVA tenderness, guarding or rebound.  Musculoskeletal:        General:  No swelling or tenderness.  Cervical back: Neck supple. No tenderness.     Right lower leg: No edema.     Left lower leg: No edema.  Skin:    General: Skin is warm and dry.     Capillary Refill: Capillary refill takes less than 2 seconds.     Findings: No erythema.  Neurological:     General: No focal deficit present.     Mental Status: He is alert.     Sensory: No sensory deficit.     Motor: No weakness.  Psychiatric:        Mood and Affect: Mood normal.    ED Results / Procedures / Treatments   Labs (all labs ordered are listed, but only abnormal results are displayed) Labs Reviewed  CBC WITH DIFFERENTIAL/PLATELET - Abnormal; Notable for the following components:      Result Value   WBC 21.0 (*)    Neutro Abs 13.0 (*)    Lymphs Abs 7.6 (*)    All other components within normal limits  COMPREHENSIVE METABOLIC PANEL - Abnormal; Notable for the following components:   Sodium 126 (*)    Chloride 89 (*)    Glucose, Bld 737 (*)    All other components within normal limits  URINALYSIS, ROUTINE W REFLEX MICROSCOPIC - Abnormal; Notable for the following components:   Color, Urine STRAW (*)    Glucose, UA >=500 (*)    Ketones, ur 5 (*)    All other components within normal limits  LIPASE, BLOOD - Abnormal; Notable for the following components:   Lipase 58 (*)    All other components within normal limits  BETA-HYDROXYBUTYRIC ACID - Abnormal; Notable for the following components:   Beta-Hydroxybutyric Acid 3.13 (*)    All other components within normal limits  BLOOD GAS, VENOUS - Abnormal; Notable for the following components:   pO2, Ven <31.0 (*)    All other components within normal limits  CBG MONITORING, ED - Abnormal; Notable for the following components:   Glucose-Capillary >600 (*)    All other components within normal limits  RESP PANEL BY RT-PCR (FLU A&B, COVID) ARPGX2  URINE CULTURE  CULTURE, BLOOD (ROUTINE X 2)  CULTURE, BLOOD (ROUTINE X 2)  RAPID URINE DRUG  SCREEN, HOSP PERFORMED  TSH  LACTIC ACID, PLASMA  MAGNESIUM  AMMONIA  ETHANOL  CBG MONITORING, ED    EKG None  Radiology DG Chest Portable 1 View  Result Date: 08/11/2021 CLINICAL DATA:  Fatigue, hyperglycemia EXAM: PORTABLE CHEST 1 VIEW COMPARISON:  06/24/2021 FINDINGS: Cardiac size is within normal limits. Mediastinum is unremarkable. There are no signs of alveolar pulmonary edema or focal pulmonary consolidation. There is faint haziness with air soft tissue interface in the left lower lung fields. Lung markings are seen extending lateral to this interface. Costophrenic angles are clear. There is no definite pneumothorax. There are linear densities in both apices, possibly scarring. IMPRESSION: There are no signs of pulmonary edema or focal pulmonary consolidation. There is small faint radiopacity with air soft tissue interface in the its lateral margin. This may suggest an artifact such as skin fold or early infiltrate in the left lower lung fields. Short-term follow-up PA and lateral views of chest may be considered. Electronically Signed   By: Elmer Picker M.D.   On: 08/11/2021 16:45    Procedures Procedures   CRITICAL CARE Performed by: Gwenyth Allegra Kimmberly Wisser Total critical care time: 35 minutes Critical care time was exclusive of separately billable procedures and treating other patients. Critical  care was necessary to treat or prevent imminent or life-threatening deterioration. Critical care was time spent personally by me on the following activities: development of treatment plan with patient and/or surrogate as well as nursing, discussions with consultants, evaluation of patient's response to treatment, examination of patient, obtaining history from patient or surrogate, ordering and performing treatments and interventions, ordering and review of laboratory studies, ordering and review of radiographic studies, pulse oximetry and re-evaluation of patient's  condition.   Medications Ordered in ED Medications  insulin regular, human (MYXREDLIN) 100 units/ 100 mL infusion (has no administration in time range)  lactated ringers infusion (has no administration in time range)  dextrose 5 % in lactated ringers infusion (has no administration in time range)  dextrose 50 % solution 0-50 mL (has no administration in time range)  potassium chloride 10 mEq in 100 mL IVPB (has no administration in time range)  cefTRIAXone (ROCEPHIN) 1 g in sodium chloride 0.9 % 100 mL IVPB (has no administration in time range)  azithromycin (ZITHROMAX) 500 mg in sodium chloride 0.9 % 250 mL IVPB (has no administration in time range)  gabapentin (NEURONTIN) capsule 600 mg (has no administration in time range)  sodium chloride 0.9 % bolus 1,000 mL (1,000 mLs Intravenous New Bag/Given 08/11/21 1642)    ED Course  I have reviewed the triage vital signs and the nursing notes.  Pertinent labs & imaging results that were available during my care of the patient were reviewed by me and considered in my medical decision making (see chart for details).    MDM Rules/Calculators/A&P                           Barry Mccoy is a 70 y.o. male with a past medical history significant for CAD with prior STEMI, diabetes, previous GI bleeding, recent discovery of DVT on Eliquis therapy, depression, anxiety, previous pancreatitis, and chronic kidney disease who presents with feeling dehydrated, fatigue, malaise, urinary frequency, and constipation.  Patient says that he has been feeling "sick" for the last few days.  Patient was found by EMS to have a sugar of over 600.  When asked if patient had taken his sugar recently he says no and when asked if he takes any of his glucose medications he says no.  He does however state he has been taking his Eliquis for the recent DVT and it is doing better.  He denies fevers, chills congestion, cough, chest pain, shortness of breath but does report some  occasional nausea, vomiting, and increased thirst.  He reports has been urinating more and having some constipation.  Denies any new pains but has chronic extremity neuropathic pains.  He feels "weak all over" and tired.  On exam, mucous membranes are extremely dry.  Lungs were clear and chest was nontender.  Back was nontender.  Abdomen was nontender.  Bowel sounds appreciated.  Patient moving all extremities.    Patient's glucose on arrival was greater than 600 and in triage is blood pressure was low.  We will start some fluids.  When I first evaluated patient his blood pressure had improved around 761 systolic.  Given his report that he does not take any of his glucose medications and his glucose being over 600, suspect he may be in DKA.  He denies any history of this.  We will get screening work-up including that to look for occult infection driving this however I anticipate patient will likely need admission.  6:10 PM Patient's work-up continue to return and does appear patient is nearing DKA.  His anion gap is elevated and glucose of 737.  Urine does have ketones and pH is 7.33.  His beta hydroxybutyric acid is significant elevated at 3.13.  Patient did appear to have an elevated white blood cell count of 21 and a chest x-ray showed evidence of possible pneumonia.  Given the cough and the vital sign glucose abnormalities, will treat for pneumonia as well.  Patient started on insulin drip and will be admitted for further management of early DKA and pneumonia.  He is on room air and blood pressures have improved and I just assessed him.  He requested gabapentin which helps with his neuropathic pains at baseline.  Will call for admission further management.   Final Clinical Impression(s) / ED Diagnoses Final diagnoses:  Diabetic ketoacidosis without coma associated with other specified diabetes mellitus ([place])  Hyperglycemia  Community acquired pneumonia, unspecified laterality    Clinical  Impression: 1. Diabetic ketoacidosis without coma associated with other specified diabetes mellitus ([place])   2. Hyperglycemia   3. Community acquired pneumonia, unspecified laterality     Disposition: Admit  This note was prepared with assistance of Systems analyst. Occasional wrong-word or sound-a-like substitutions may have occurred due to the inherent limitations of voice recognition software.     Ceirra Belli, Gwenyth Allegra, MD 08/11/21 [id number]

## 2021-08-12 DIAGNOSIS — D72829 Elevated white blood cell count, unspecified: Secondary | ICD-10-CM

## 2021-08-12 DIAGNOSIS — I959 Hypotension, unspecified: Secondary | ICD-10-CM

## 2021-08-12 DIAGNOSIS — I480 Paroxysmal atrial fibrillation: Secondary | ICD-10-CM

## 2021-08-12 DIAGNOSIS — I82409 Acute embolism and thrombosis of unspecified deep veins of unspecified lower extremity: Secondary | ICD-10-CM

## 2021-08-12 LAB — GLUCOSE, CAPILLARY
Glucose-Capillary: 110 mg/dL — ABNORMAL HIGH (ref 70–99)
Glucose-Capillary: 171 mg/dL — ABNORMAL HIGH (ref 70–99)
Glucose-Capillary: 173 mg/dL — ABNORMAL HIGH (ref 70–99)
Glucose-Capillary: 175 mg/dL — ABNORMAL HIGH (ref 70–99)
Glucose-Capillary: 179 mg/dL — ABNORMAL HIGH (ref 70–99)
Glucose-Capillary: 183 mg/dL — ABNORMAL HIGH (ref 70–99)
Glucose-Capillary: 196 mg/dL — ABNORMAL HIGH (ref 70–99)
Glucose-Capillary: 267 mg/dL — ABNORMAL HIGH (ref 70–99)
Glucose-Capillary: 276 mg/dL — ABNORMAL HIGH (ref 70–99)

## 2021-08-12 LAB — BASIC METABOLIC PANEL
Anion gap: 11 (ref 5–15)
Anion gap: 8 (ref 5–15)
BUN: 13 mg/dL (ref 8–23)
BUN: 12 mg/dL (ref 8–23)
CO2: 24 mmol/L (ref 22–32)
CO2: 27 mmol/L (ref 22–32)
Calcium: 8.7 mg/dL — ABNORMAL LOW (ref 8.9–10.3)
Calcium: 8.7 mg/dL — ABNORMAL LOW (ref 8.9–10.3)
Chloride: 98 mmol/L (ref 98–111)
Chloride: 100 mmol/L (ref 98–111)
Creatinine, Ser: 0.68 mg/dL (ref 0.61–1.24)
Creatinine, Ser: 0.64 mg/dL (ref 0.61–1.24)
GFR, Estimated: >60 mL/min (ref >60)
GFR, Estimated: >60 mL/min (ref >60)
Glucose, Bld: 268 mg/dL — ABNORMAL HIGH (ref 70–99)
Glucose, Bld: 150 mg/dL — ABNORMAL HIGH (ref 70–99)
Potassium: 3.5 mmol/L (ref 3.5–5.1)
Potassium: 3.2 mmol/L — ABNORMAL LOW (ref 3.5–5.1)
Sodium: 133 mmol/L — ABNORMAL LOW (ref 135–145)
Sodium: 135 mmol/L (ref 135–145)

## 2021-08-12 LAB — BETA-HYDROXYBUTYRIC ACID: Beta-Hydroxybutyric Acid: 0.34 mmol/L — ABNORMAL HIGH (ref 0.05–0.27)

## 2021-08-12 LAB — MRSA NEXT GEN BY PCR, NASAL: MRSA by PCR Next Gen: NOT DETECTED

## 2021-08-12 MED ORDER — APIXABAN 5 MG PO TABS
5.0000 mg | ORAL_TABLET | Freq: Two times a day (BID) | ORAL | Status: DC
  Administered 2021-08-12 – 2021-08-15 (×7): 5 mg via ORAL
  Filled 2021-08-12 (×7): qty 1

## 2021-08-12 MED ORDER — POTASSIUM CHLORIDE CRYS ER 20 MEQ PO TBCR
40.0000 meq | EXTENDED_RELEASE_TABLET | Freq: Once | ORAL | Status: AC
  Administered 2021-08-12: 40 meq via ORAL
  Filled 2021-08-12: qty 2

## 2021-08-12 MED ORDER — POLYSACCHARIDE IRON COMPLEX 150 MG PO CAPS: 150.0000 mg | ORAL_CAPSULE | Freq: Two times a day (BID) | ORAL | Status: DC

## 2021-08-12 MED ORDER — ATORVASTATIN CALCIUM 40 MG PO TABS
80.0000 mg | ORAL_TABLET | Freq: Every day | ORAL | Status: DC
  Administered 2021-08-12 – 2021-08-15 (×5): 80 mg via ORAL
  Filled 2021-08-12 (×5): qty 2

## 2021-08-12 MED ORDER — FENOFIBRATE 160 MG PO TABS
160.0000 mg | ORAL_TABLET | Freq: Every day | ORAL | Status: DC
  Administered 2021-08-12 – 2021-08-15 (×4): 160 mg via ORAL
  Filled 2021-08-12 (×4): qty 1

## 2021-08-12 MED ORDER — FOLIC ACID 1 MG PO TABS
1.0000 mg | ORAL_TABLET | Freq: Every day | ORAL | Status: DC
  Administered 2021-08-12 – 2021-08-13 (×3): 1 mg via ORAL
  Filled 2021-08-12 (×3): qty 1

## 2021-08-12 MED ORDER — GABAPENTIN 300 MG PO CAPS
300.0000 mg | ORAL_CAPSULE | Freq: Three times a day (TID) | ORAL | Status: DC
  Administered 2021-08-12 – 2021-08-14 (×6): 300 mg via ORAL
  Filled 2021-08-12 (×6): qty 1

## 2021-08-12 MED ORDER — GABAPENTIN 300 MG PO CAPS: 300.0000 mg | ORAL_CAPSULE | Freq: Once | ORAL | Status: DC

## 2021-08-12 MED ORDER — THIAMINE HCL 100 MG PO TABS
100.0000 mg | ORAL_TABLET | Freq: Every day | ORAL | Status: DC
  Administered 2021-08-12 – 2021-08-13 (×3): 100 mg via ORAL
  Filled 2021-08-12 (×3): qty 1

## 2021-08-12 MED ORDER — INSULIN GLARGINE-YFGN 100 UNIT/ML ~~LOC~~ SOLN
10.0000 [IU] | Freq: Every day | SUBCUTANEOUS | Status: DC
  Administered 2021-08-12 – 2021-08-13 (×2): 10 [IU] via SUBCUTANEOUS
  Filled 2021-08-12 (×2): qty 0.1

## 2021-08-12 MED ORDER — GABAPENTIN 300 MG PO CAPS
600.0000 mg | ORAL_CAPSULE | Freq: Once | ORAL | Status: AC
  Administered 2021-08-12: 600 mg via ORAL
  Filled 2021-08-12: qty 2

## 2021-08-12 MED ORDER — NICOTINE 14 MG/24HR TD PT24
14.0000 mg | MEDICATED_PATCH | Freq: Every day | TRANSDERMAL | Status: DC
  Administered 2021-08-12 – 2021-08-15 (×4): 14 mg via TRANSDERMAL
  Filled 2021-08-12 (×4): qty 1

## 2021-08-12 MED ORDER — INSULIN ASPART 100 UNIT/ML IJ SOLN
0.0000 [IU] | Freq: Three times a day (TID) | INTRAMUSCULAR | Status: DC
  Administered 2021-08-12: 3 [IU] via SUBCUTANEOUS
  Administered 2021-08-12: 8 [IU] via SUBCUTANEOUS
  Administered 2021-08-12: 3 [IU] via SUBCUTANEOUS
  Administered 2021-08-13 (×2): 11 [IU] via SUBCUTANEOUS
  Administered 2021-08-13 – 2021-08-14 (×4): 5 [IU] via SUBCUTANEOUS
  Administered 2021-08-15: 2 [IU] via SUBCUTANEOUS
  Administered 2021-08-15: 15 [IU] via SUBCUTANEOUS

## 2021-08-12 MED ORDER — LACTATED RINGERS IV BOLUS
1000.0000 mL | Freq: Once | INTRAVENOUS | Status: AC
  Administered 2021-08-12: 1000 mL via INTRAVENOUS

## 2021-08-12 MED ORDER — ACETAMINOPHEN 325 MG PO TABS
650.0000 mg | ORAL_TABLET | Freq: Four times a day (QID) | ORAL | Status: DC | PRN
  Administered 2021-08-12: 650 mg via ORAL
  Filled 2021-08-12: qty 2

## 2021-08-12 MED ORDER — PANTOPRAZOLE SODIUM 40 MG PO TBEC
40.0000 mg | DELAYED_RELEASE_TABLET | Freq: Every day | ORAL | Status: DC
  Administered 2021-08-12 – 2021-08-15 (×5): 40 mg via ORAL
  Filled 2021-08-12 (×5): qty 1

## 2021-08-12 MED ORDER — DIPHENHYDRAMINE HCL 25 MG PO CAPS: 25.0000 mg | ORAL_CAPSULE | Freq: Four times a day (QID) | ORAL | Status: DC | PRN

## 2021-08-12 NOTE — Progress Notes (Signed)
Pt ready for transfer to [facility]. Report given. Belongings collected. Pt stable at time of transfer. 6E RN met [place] in pt's new room & settled into bed.

## 2021-08-12 NOTE — TOC Initial Note (Addendum)
Transition of Care [facility]) - Initial/Assessment Note    Patient Details  Name: Barry Mccoy MRN: [id number] Date of Birth: 1951-07-30  Transition of Care [facility]) CM/SW Contact:    Iona Beard, [place] Phone Number: 08/12/2021, 1:23 PM  Clinical Narrative:                 [facility] consulted for homelessness and PCP needs. CSW spoke to pt who states that he will be moving into an apartment in [place] on 12/8 and that he has and will continue to live in the motel until this time. CSW spoke with pts daughter to confirm this. Pts daughter states that she hired someone to assist her father in finding an apartment, signing leases, paying deposits, and furnishing the new place. She states her father has been in a motel since July. Pts daughter states that her father has been drinking heavily, spending money on women online/in person. She states that he has not been changing his clothes for weeks and urinating on himself. She states that he has moved a lot recently and that he does not clean after himself. She states that he will tell her things that have happened between him and her that are not true. She states that she does not feel he is capable of taking his mediations properly.   She confirms pt is a veteran but is not established with VA benefits as he will not go through the process of getting established. Pts daughter has called [facility] and filed a report on pt. Pt receives social security and disability.   Pts daughter would like pts mental status evaluated. CSW explained that MD would be notified but cannot guarantee this can happen. CSW explained possibility of pt getting [place] services. She would appreciate this. TOC to follow.   Expected Discharge Plan: [place] Barriers to Discharge: Continued Medical Work up   Patient Goals and CMS Choice Patient states their goals for this hospitalization and ongoing recovery are:: Home with [facility] CMS Medicare.gov Compare Post Acute  Care list provided to:: Patient Choice offered to / list presented to : Patient  Expected Discharge Plan and Services Expected Discharge Plan: [place] In-house Referral: Clinical Social Work Discharge Planning Services: CM Consult Post Acute Care Choice: [place] arrangements for the past 2 months: Hotel/Motel                                      Prior Living Arrangements/Services Living arrangements for the past 2 months: Hotel/Motel Lives with:: Self Patient language and need for interpreter reviewed:: Yes        Need for Family Participation in Patient Care: Yes (Comment) Care giver support system in place?: Yes (comment)   Criminal Activity/Legal Involvement Pertinent to Current Situation/Hospitalization: No - Comment as needed  Activities of Daily Living      Permission Sought/Granted                  Emotional Assessment Appearance:: Appears stated age Attitude/Demeanor/Rapport: Engaged Affect (typically observed): Accepting Orientation: : Oriented to Self, Oriented to Place, Oriented to  Time, Oriented to Situation Alcohol / Substance Use: Not Applicable Psych Involvement: No (comment)  Admission diagnosis:  DKA (diabetic ketoacidosis) ([place]) [E11.10] Hyperglycemia [R73.9] Diabetic ketoacidosis without coma associated with other specified diabetes mellitus ([place]) [E13.10] Community acquired pneumonia, unspecified laterality [J18.9] Patient Active Problem  List   Diagnosis Date Noted   Hypotension 08/12/2021   Leukocytosis 08/12/2021   DVT (deep venous thrombosis) ([place]) 08/12/2021   AF (paroxysmal atrial fibrillation) ([place]) 08/12/2021   DKA (diabetic ketoacidosis) ([place]) 08/11/2021   STEMI involving right coronary artery ([place]) 04/25/2021   Type 2 diabetes mellitus without complication, without long-term current use of insulin ([place]) 04/25/2021   Acute pancreatitis    GI bleed 11/06/2016   Acute blood loss anemia  11/06/2016   Nausea with vomiting 11/06/2016   Alcohol abuse 11/06/2016   UTI (urinary tract infection) 10/23/2016   Urinary retention 10/23/2016   Positive D dimer    Severe single current episode of major depressive disorder, with psychotic features ([place])    Normocytic anemia 10/01/2016   Acute GI bleeding 10/01/2016   Grief reaction 04/19/2016   Depression 04/19/2016   Benign prostatic hyperplasia 04/19/2016   Testosterone deficiency 10/26/2015   Vitamin D deficiency 10/21/2015   Tobacco abuse 10/17/2015   Chronic pain syndrome 10/17/2015   Anxiety    Cervical spine disease    Disorder of lumbar spine    PCP:  System, Provider Not In Pharmacy:   [facility] [address] [place] [address] Phone: [phone] Fax: [phone]     Social Determinants of Health (SDOH) Interventions    Readmission Risk Interventions No flowsheet data found.

## 2021-08-12 NOTE — Progress Notes (Signed)
PROGRESS NOTE    Barry Mccoy  NIO:[id number] DOB: 05-08-1951 DOA: 08/11/2021 PCP: System, Provider Not In   Chief Complain: Not feeling well  Brief Narrative: Patient is a 70-year male with history of coronary artery disease/STEMI, type 2 diabetes, paroxysmal A. fib, left lower extremity DVT on Eliquis, MGUS, history of GI bleed, GERD, hyperlipidemia who presented with generalized malaise.  He was not feeling well for last couple of days.  He was diagnosed with extensive left lower extremity DVT on 11/17 and was started on Eliquis.  On presentation, he was hypotensive, white cell count of 21,000, lactate level was normal.  Sodium was 126, anion gap was elevated at 15, elevated beta hydroxybutyrate.  Chest x-ray showed left lower lobe artifact versus infiltrate.  Patient was suspected to have pneumonia and was started on Rocephin and azithromycin.  Also started on IV insulin for DKA.  He is homeless.  Assessment & Plan:   Principal Problem:   DKA (diabetic ketoacidosis) ([place]) Active Problems:   Hypotension   Leukocytosis   DVT (deep venous thrombosis) ([facility])   AF (paroxysmal atrial fibrillation) ([place])   DKA: Last hemoglobin A1c of 7.9 as of 8/22.  Started on insulin drip.  Anion gap is closed.  We will continue sliding scale and Lantus.  Monitor blood sugars.  Check hemoglobin A1c.  Takes metformin at home.  Patient states he does not want to take metformin anymore because it causes diarrhea.  Diverticular consulted  Suspected sepsis: Ruled out.  Presented with hypotension, elevated white cell count.  Blood cultures will be followed.  Antibiotic discontinued.  Procalcitonin nonreassuring.  Suspected community-acquired pneumonia: Chest x-ray showed possible artifact versus infiltrate in the left lower lobe.  No respiratory symptoms.  Procalcitonin nonreassuring.  Antibiotics discontinued  Combined systolic/diastolic congestive heart failure: Currently euvolemic.  Echo on 8/22 showed EF of 35  to 81%, grade 1 diastolic dysfunction.  Takes Entresto, metoprolol.  These are on hold because of hypotension.  Will restart when appropriate  History of recent left lower extremity DVT: On Eliquis  History of hyperlipidemia: Takes Lipitor at home.  Also on fenofibrate  History of paroxysmal A. fib:   On Eliquis for anticoagulation.  Previously not taking anticoagulant due to history of GI bleed.  Also takes metoprolol for rate control.  History of BPH: Tamsulosin  Tobacco use: He smokes a pack a day.  Counseled for cessation.  Continue nicotine patch  Homelessness: Living in a motel.  Does not have a PCP.  TOC consulted          DVT prophylaxis:Eliquis Code Status: Full Family Communication: None at the bedside Patient status:  Dispo: The patient is from: Home              Anticipated d/c is WE:[id number]               Anticipated d/c date is: Tomorrow  Consultants: None  Procedures: None  Antimicrobials:  Anti-infectives (From admission, onward)    Start     Dose/Rate Route Frequency Ordered Stop   08/11/21 1815  cefTRIAXone (ROCEPHIN) 1 g in sodium chloride 0.9 % 100 mL IVPB        1 g 200 mL/hr over 30 Minutes Intravenous  Once 08/11/21 1807 08/11/21 2059   08/11/21 1815  azithromycin (ZITHROMAX) 500 mg in sodium chloride 0.9 % 250 mL IVPB        500 mg 250 mL/hr over 60 Minutes Intravenous  Once 08/11/21 1807 08/11/21 2113  Subjective:  Patient seen and examined at the bedside this morning.  Hemodynamically stable.  Comfortable.  Feels well.  Denies any complaints today Objective: Vitals:   08/12/21 0400 08/12/21 0436 08/12/21 0500 08/12/21 0600  BP: (!) 117/47 112/65 (!) 130/56 (!) 98/47  Pulse: (!) 57   81  Resp: 11  11 17   Temp:      TempSrc:      SpO2: 94%   94%  Weight:      Height:        Intake/Output Summary (Last 24 hours) at 08/12/2021 0655 Last data filed at 08/12/2021 0600 Gross per 24 hour  Intake 3751.79 ml  Output 300 ml  Net  3451.79 ml   Filed Weights   08/11/21 1614  Weight: 95 kg    Examination:  General exam: Overall comfortable, not in distress HEENT: PERRL Respiratory system:  no wheezes or crackles  Cardiovascular system: Irregularly irregular rhythm. Gastrointestinal system: Abdomen is nondistended, soft and nontender. Central nervous system: Alert and oriented Extremities: No edema, no clubbing ,no cyanosis Skin: No rashes, no ulcers,no icterus      Data Reviewed: I have personally reviewed following labs and imaging studies  CBC: Recent Labs  Lab 08/11/21 1558  WBC 21.0*  NEUTROABS 13.0*  HGB 16.6  HCT 48.9  MCV 92.3  PLT 734   Basic Metabolic Panel: Recent Labs  Lab 08/11/21 1558 08/11/21 1614 08/11/21 2004 08/12/21 0004 08/12/21 0341  NA 126*  --  130* 133* 135  K 4.8  --  4.5 3.5 3.2*  CL 89*  --  92* 98 100  CO2 22  --  24 24 27   GLUCOSE 737*  --  455* 268* 150*  BUN 19  --  16 13 12   CREATININE 1.00  --  0.85 0.68 0.64  CALCIUM 9.4  --  9.2 8.7* 8.7*  MG  --  2.3  --   --   --    GFR: Estimated Creatinine Clearance: 101.3 mL/min (by C-G formula based on SCr of 0.64 mg/dL). Liver Function Tests: Recent Labs  Lab 08/11/21 1558  AST 15  ALT 15  ALKPHOS 95  BILITOT 1.1  PROT 7.5  ALBUMIN 4.0   Recent Labs  Lab 08/11/21 1614  LIPASE 58*   Recent Labs  Lab 08/11/21 1616  AMMONIA 22   Coagulation Profile: No results for input(s): INR, PROTIME in the last 168 hours. Cardiac Enzymes: No results for input(s): CKTOTAL, CKMB, CKMBINDEX, TROPONINI in the last 168 hours. BNP (last 3 results) No results for input(s): PROBNP in the last 8760 hours. HbA1C: No results for input(s): HGBA1C in the last 72 hours. CBG: Recent Labs  Lab 08/11/21 2306 08/12/21 0020 08/12/21 0116 08/12/21 0217 08/12/21 0312  GLUCAP 211* 183* 171* 175* 179*   Lipid Profile: No results for input(s): CHOL, HDL, LDLCALC, TRIG, CHOLHDL, LDLDIRECT in the last 72 hours. Thyroid  Function Tests: Recent Labs    08/11/21 1613  TSH 0.794   Anemia Panel: No results for input(s): VITAMINB12, FOLATE, FERRITIN, TIBC, IRON, RETICCTPCT in the last 72 hours. Sepsis Labs: Recent Labs  Lab 08/11/21 1614 08/11/21 2004  PROCALCITON  --  <0.10  LATICACIDVEN 1.7  --     Recent Results (from the past 240 hour(s))  Resp Panel by RT-PCR (Flu A&B, Covid) Nasopharyngeal Swab     Status: None   Collection Time: 08/11/21  4:16 PM   Specimen: Nasopharyngeal Swab; Nasopharyngeal(NP) swabs in vial transport medium  Result  Value Ref Range Status   SARS Coronavirus 2 by RT PCR NEGATIVE NEGATIVE Final    Comment: (NOTE) SARS-CoV-2 target nucleic acids are NOT DETECTED.  The SARS-CoV-2 RNA is generally detectable in upper respiratory specimens during the acute phase of infection. The lowest concentration of SARS-CoV-2 viral copies this assay can detect is 138 copies/mL. A negative result does not preclude SARS-Cov-2 infection and should not be used as the sole basis for treatment or other patient management decisions. A negative result may occur with  improper specimen collection/handling, submission of specimen other than nasopharyngeal swab, presence of viral mutation(s) within the areas targeted by this assay, and inadequate number of viral copies(<138 copies/mL). A negative result must be combined with clinical observations, patient history, and epidemiological information. The expected result is Negative.  Fact Sheet for Patients:  EntrepreneurPulse.com.au  Fact Sheet for Healthcare Providers:  IncredibleEmployment.be  This test is no t yet approved or cleared by the [place] FDA and  has been authorized for detection and/or diagnosis of SARS-CoV-2 by FDA under an Emergency Use Authorization (EUA). This EUA will remain  in effect (meaning this test can be used) for the duration of the COVID-19 declaration under Section 564(b)(1)  of the Act, 21 U.S.C.section 360bbb-3(b)(1), unless the authorization is terminated  or revoked sooner.       Influenza A by PCR NEGATIVE NEGATIVE Final   Influenza B by PCR NEGATIVE NEGATIVE Final    Comment: (NOTE) The Xpert Xpress SARS-CoV-2/FLU/RSV plus assay is intended as an aid in the diagnosis of influenza from Nasopharyngeal swab specimens and should not be used as a sole basis for treatment. Nasal washings and aspirates are unacceptable for Xpert Xpress SARS-CoV-2/FLU/RSV testing.  Fact Sheet for Patients: EntrepreneurPulse.com.au  Fact Sheet for Healthcare Providers: IncredibleEmployment.be  This test is not yet approved or cleared by the [place] FDA and has been authorized for detection and/or diagnosis of SARS-CoV-2 by FDA under an Emergency Use Authorization (EUA). This EUA will remain in effect (meaning this test can be used) for the duration of the COVID-19 declaration under Section 564(b)(1) of the Act, 21 U.S.C. section 360bbb-3(b)(1), unless the authorization is terminated or revoked.  Performed at [facility], [place] [address], [place], [place] [address]   MRSA Next Gen by PCR, Nasal     Status: None   Collection Time: 08/11/21 11:24 PM   Specimen: Nasal Mucosa; Nasal Swab  Result Value Ref Range Status   MRSA by PCR Next Gen NOT DETECTED NOT DETECTED Final    Comment: (NOTE) The GeneXpert MRSA Assay (FDA approved for NASAL specimens only), is one component of a comprehensive MRSA colonization surveillance program. It is not intended to diagnose MRSA infection nor to guide or monitor treatment for MRSA infections. Test performance is not FDA approved in patients less than 52 years old. Performed at [facility], [place] [address], [place], [place] [address]          Radiology Studies: DG Chest Portable 1 View  Result Date: 08/11/2021 CLINICAL DATA:  Fatigue,  hyperglycemia EXAM: PORTABLE CHEST 1 VIEW COMPARISON:  06/24/2021 FINDINGS: Cardiac size is within normal limits. Mediastinum is unremarkable. There are no signs of alveolar pulmonary edema or focal pulmonary consolidation. There is faint haziness with air soft tissue interface in the left lower lung fields. Lung markings are seen extending lateral to this interface. Costophrenic angles are clear. There is no definite pneumothorax. There are linear densities in both apices, possibly scarring.  IMPRESSION: There are no signs of pulmonary edema or focal pulmonary consolidation. There is small faint radiopacity with air soft tissue interface in the its lateral margin. This may suggest an artifact such as skin fold or early infiltrate in the left lower lung fields. Short-term follow-up PA and lateral views of chest may be considered. Electronically Signed   By: Elmer Picker M.D.   On: 08/11/2021 16:45        Scheduled Meds:  apixaban  5 mg Oral BID   Chlorhexidine Gluconate Cloth  6 each Topical Daily   insulin aspart  0-15 Units Subcutaneous TID WC   insulin glargine-yfgn  10 Units Subcutaneous Daily   Continuous Infusions:  lactated ringers Stopped (08/11/21 2008)     LOS: 1 day    Time spent: More than 50% of that time was spent in counseling and/or coordination of care.      Shelly Coss, MD [facility] P12/11/2020, 6:55 AM

## 2021-08-13 LAB — CBC WITH DIFFERENTIAL/PLATELET
Abs Immature Granulocytes: 0 10*3/uL (ref 0.00–0.07)
Basophils Absolute: 0.2 10*3/uL — ABNORMAL HIGH (ref 0.0–0.1)
Basophils Relative: 1 %
Eosinophils Absolute: 0 10*3/uL (ref 0.0–0.5)
Eosinophils Relative: 0 %
HCT: 43.1 % (ref 39.0–52.0)
Hemoglobin: 15 g/dL (ref 13.0–17.0)
Lymphocytes Relative: 40 %
Lymphs Abs: 7.8 10*3/uL — ABNORMAL HIGH (ref 0.7–4.0)
MCH: 32 pg (ref 26.0–34.0)
MCHC: 34.8 g/dL (ref 30.0–36.0)
MCV: 91.9 fL (ref 80.0–100.0)
Monocytes Absolute: 1 10*3/uL (ref 0.1–1.0)
Monocytes Relative: 5 %
Neutro Abs: 10.6 10*3/uL — ABNORMAL HIGH (ref 1.7–7.7)
Neutrophils Relative %: 54 %
Platelet Morphology: NORMAL
Platelets: 285 10*3/uL (ref 150–400)
RBC: 4.69 MIL/uL (ref 4.22–5.81)
RDW: 13.3 % (ref 11.5–15.5)
WBC: 19.6 10*3/uL — ABNORMAL HIGH (ref 4.0–10.5)
nRBC: 0.2 % (ref 0.0–0.2)

## 2021-08-13 LAB — BASIC METABOLIC PANEL
Anion gap: 5 (ref 5–15)
BUN: 9 mg/dL (ref 8–23)
CO2: 22 mmol/L (ref 22–32)
Calcium: 8.3 mg/dL — ABNORMAL LOW (ref 8.9–10.3)
Chloride: 100 mmol/L (ref 98–111)
Creatinine, Ser: 0.58 mg/dL — ABNORMAL LOW (ref 0.61–1.24)
GFR, Estimated: >60 mL/min (ref >60)
Glucose, Bld: 267 mg/dL — ABNORMAL HIGH (ref 70–99)
Potassium: 3.8 mmol/L (ref 3.5–5.1)
Sodium: 127 mmol/L — ABNORMAL LOW (ref 135–145)

## 2021-08-13 LAB — URINE CULTURE: Culture: 30000 — AB

## 2021-08-13 LAB — GLUCOSE, CAPILLARY
Glucose-Capillary: 321 mg/dL — ABNORMAL HIGH (ref 70–99)
Glucose-Capillary: 220 mg/dL — ABNORMAL HIGH (ref 70–99)
Glucose-Capillary: 317 mg/dL — ABNORMAL HIGH (ref 70–99)
Glucose-Capillary: 194 mg/dL — ABNORMAL HIGH (ref 70–99)

## 2021-08-13 MED ORDER — FOLIC ACID 1 MG PO TABS
1.0000 mg | ORAL_TABLET | Freq: Every day | ORAL | Status: DC
  Administered 2021-08-13 – 2021-08-15 (×3): 1 mg via ORAL
  Filled 2021-08-13 (×3): qty 1

## 2021-08-13 MED ORDER — INSULIN GLARGINE-YFGN 100 UNIT/ML ~~LOC~~ SOLN
20.0000 [IU] | Freq: Every day | SUBCUTANEOUS | Status: DC
  Administered 2021-08-14: 20 [IU] via SUBCUTANEOUS
  Filled 2021-08-13 (×2): qty 0.2

## 2021-08-13 MED ORDER — POLYVINYL ALCOHOL 1.4 % OP SOLN
1.0000 [drp] | OPHTHALMIC | Status: DC | PRN
  Administered 2021-08-13: 22:00:00 1 [drp] via OPHTHALMIC
  Filled 2021-08-13: qty 15

## 2021-08-13 MED ORDER — POLYETHYLENE GLYCOL 3350 17 G PO PACK
17.0000 g | PACK | Freq: Every day | ORAL | Status: DC
  Administered 2021-08-13 – 2021-08-15 (×3): 17 g via ORAL
  Filled 2021-08-13 (×3): qty 1

## 2021-08-13 MED ORDER — INSULIN GLARGINE-YFGN 100 UNIT/ML ~~LOC~~ SOLN
10.0000 [IU] | Freq: Once | SUBCUTANEOUS | Status: AC
  Administered 2021-08-13: 15:00:00 10 [IU] via SUBCUTANEOUS
  Filled 2021-08-13: qty 0.1

## 2021-08-13 MED ORDER — THIAMINE HCL 100 MG PO TABS
100.0000 mg | ORAL_TABLET | Freq: Every day | ORAL | Status: DC
  Administered 2021-08-13 – 2021-08-15 (×3): 100 mg via ORAL
  Filled 2021-08-13 (×3): qty 1

## 2021-08-13 MED ORDER — INSULIN ASPART 100 UNIT/ML IJ SOLN
4.0000 [IU] | Freq: Three times a day (TID) | INTRAMUSCULAR | Status: DC
Start: 2021-08-13 — End: 2021-08-15
  Administered 2021-08-13 – 2021-08-14 (×4): 4 [IU] via SUBCUTANEOUS

## 2021-08-13 NOTE — Progress Notes (Signed)
Central Telemetry called ,Patient was in Atrial Flutter, HR-140 @10 :29. He now is NSR with PAC's. Dr. Lonn [place] notified.
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
## 2021-08-13 NOTE — Progress Notes (Signed)
Inpatient Diabetes Program Recommendations  AACE/ADA: New Consensus Statement on Inpatient Glycemic Control (2015)  Target Ranges:  Prepandial:   less than 140 mg/dL      Peak postprandial:   less than 180 mg/dL (1-2 hours)      Critically ill patients:  140 - 180 mg/dL   Lab Results  Component Value Date   GLUCAP 220 (H) 08/13/2021   HGBA1C 7.9 (H) 04/25/2021    Review of Glycemic Control  Diabetes history: DM2 Outpatient Diabetes medications: metformin 500 mg BID (not taking) Current orders for Inpatient glycemic control: Semglee 20 units QD, Novolog 0-15 units TID with meals  HgbA1C - 7.9% on 04/25/21 Updated HgbA1C ordered 08/11/21. Awiting results  CBGs 321, 220 mg/dL. (Semglee order has been increased and will begin 12/05)  Pt eating 100%.  Inpatient Diabetes Program Recommendations:    Add Novolog 4 units TID with meals if eating > 50%.  Will f/u in am.   Thank you. Lorenda Peck, RD, LDN, CDE Inpatient Diabetes Coordinator [phone]

## 2021-08-14 DIAGNOSIS — F4325 Adjustment disorder with mixed disturbance of emotions and conduct: Secondary | ICD-10-CM

## 2021-08-14 LAB — CBC WITH DIFFERENTIAL/PLATELET
Abs Immature Granulocytes: 0.1 10*3/uL — ABNORMAL HIGH (ref 0.00–0.07)
Basophils Absolute: 0.1 10*3/uL (ref 0.0–0.1)
Basophils Relative: 0 %
Eosinophils Absolute: 0.2 10*3/uL (ref 0.0–0.5)
Eosinophils Relative: 1 %
HCT: 43.8 % (ref 39.0–52.0)
Hemoglobin: 15.1 g/dL (ref 13.0–17.0)
Immature Granulocytes: 1 %
Lymphocytes Relative: 74 %
Lymphs Abs: 16.3 10*3/uL — ABNORMAL HIGH (ref 0.7–4.0)
MCH: 31.9 pg (ref 26.0–34.0)
MCHC: 34.5 g/dL (ref 30.0–36.0)
MCV: 92.6 fL (ref 80.0–100.0)
Monocytes Absolute: 0.8 10*3/uL (ref 0.1–1.0)
Monocytes Relative: 4 %
Neutro Abs: 4.3 10*3/uL (ref 1.7–7.7)
Neutrophils Relative %: 20 %
Platelets: 311 10*3/uL (ref 150–400)
RBC: 4.73 MIL/uL (ref 4.22–5.81)
RDW: 13.2 % (ref 11.5–15.5)
WBC: 21.8 10*3/uL — ABNORMAL HIGH (ref 4.0–10.5)
nRBC: 0 % (ref 0.0–0.2)

## 2021-08-14 LAB — BASIC METABOLIC PANEL
Anion gap: 4 — ABNORMAL LOW (ref 5–15)
BUN: 8 mg/dL (ref 8–23)
CO2: 25 mmol/L (ref 22–32)
Calcium: 8.6 mg/dL — ABNORMAL LOW (ref 8.9–10.3)
Chloride: 101 mmol/L (ref 98–111)
Creatinine, Ser: 0.63 mg/dL (ref 0.61–1.24)
GFR, Estimated: >60 mL/min (ref >60)
Glucose, Bld: 239 mg/dL — ABNORMAL HIGH (ref 70–99)
Potassium: 4.2 mmol/L (ref 3.5–5.1)
Sodium: 130 mmol/L — ABNORMAL LOW (ref 135–145)

## 2021-08-14 LAB — GLUCOSE, CAPILLARY
Glucose-Capillary: 206 mg/dL — ABNORMAL HIGH (ref 70–99)
Glucose-Capillary: 205 mg/dL — ABNORMAL HIGH (ref 70–99)
Glucose-Capillary: 247 mg/dL — ABNORMAL HIGH (ref 70–99)
Glucose-Capillary: 339 mg/dL — ABNORMAL HIGH (ref 70–99)

## 2021-08-14 LAB — VITAMIN D 25 HYDROXY (VIT D DEFICIENCY, FRACTURES): Vit D, 25-Hydroxy: 58.87 ng/mL (ref 30–100)

## 2021-08-14 LAB — VITAMIN B12: Vitamin B-12: 431 pg/mL (ref 180–914)

## 2021-08-14 MED ORDER — GABAPENTIN 300 MG PO CAPS
600.0000 mg | ORAL_CAPSULE | Freq: Three times a day (TID) | ORAL | Status: DC
  Administered 2021-08-14 – 2021-08-15 (×4): 600 mg via ORAL
  Filled 2021-08-14 (×4): qty 2

## 2021-08-14 MED ORDER — AMOXICILLIN-POT CLAVULANATE 875-125 MG PO TABS
1.0000 | ORAL_TABLET | Freq: Two times a day (BID) | ORAL | Status: DC
  Administered 2021-08-14 – 2021-08-15 (×3): 1 via ORAL
  Filled 2021-08-14 (×3): qty 1
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
## 2021-08-14 NOTE — Care Management Important Message (Signed)
Important Message  Patient Details IM Letter given to the Patient. Name: Barry Mccoy MRN: [id number] Date of Birth: [birth year]   Medicare Important Message Given:  Yes     Kerin Salen 08/14/2021, 11:27 AM

## 2021-08-14 NOTE — Consult Note (Signed)
[facility] New Psychiatric Evaluation   Service Date: August 14, 2021 LOS:  LOS: 3 days    Assessment  Barry Mccoy is a 70 y.o. male admitted medically for 08/11/2021  3:43 PM for DKA. He carries the psychiatric diagnoses of complicated grief and anxiety and has a past medical history of  tobacco use, chronic pain, vitamin D deficiency, testosterone deficiency, BPH, anemia, pancreatitis, prior MI. Psychiatry was consulted for behavior changes by Shelly Coss, MD upon request of his daughter.    His current presentation of gradually declining executive function with reported marked self-care deficits is most consistent with early dementia; it is unclear how much pt's reported EtOH abuse is contributing to the clinical picture. It is additionally concerning that pt's WBC has been ~20 for the past 3 months; per primary team there is some concern for a hemotologic malignancy that may be worked up outpt. Patient denied all psych complaints outside of mild episodic depression after the death of his wife. He became upset when asked why he takes quetiapine - feels this medication is unindicated, oversedating, and only prescribed because of his daughter's meddling. On bedside cognitive testing (MOCA),he scored quite well, missing 1 point for language and 5 points on delayed recall. He has significant cognitive reserve given high level of education; echo concern for FTD (which often presents with personality changes/executive dysfunction early in course prior to memory deficits).  He has some insight into his deficits, stating he needs home health, meal delivery, and companionship. He did not grant permission to call daughter for collateral; review of available notes indicate pt has been appropriately interacting with staff.  As pt denied all psychiatric symptoms, declined all medications, and has been appropriate through this hospital stay, psychiatry will sign off at this time.  Please see plan  below for detailed recommendations.   Diagnoses:  Active Hospital problems: Principal Problem:   DKA (diabetic ketoacidosis) (HCC) Active Problems:   Hypotension   Leukocytosis   DVT (deep venous thrombosis) (HCC)   AF (paroxysmal atrial fibrillation) ([place])    Problems edited/added by me: No problems updated.  Plan  ## Medications:  -- none  ## Medical Decision Making Capacity:  Not formally assessed  ## Further Work-up:  -- consider head CT wwo -- b12, RPR, vit D, hepatitis, HIV -- TSH wnl this hospital stay  ## Disposition:  -- per primary team  Thank you for this consult request. Recommendations have been communicated to the primary team.  We will sign off at this time.   [place] A Wilmont Olund    NEW vs followup history  Relevant Aspects of Hospital Course:  Admitted on 08/11/2021 for DKA. TOC had conversation with daughter on 12/3 with concern about heavy drinking, spending money, not changing clothes, urinating on self, making things up, etc. An APS report has been opened on pt for this reason.   Patient Report:  Pt seen in late afternoon. He states that he has been in a motel for almost 6 months and began feeling ill a week ago, stopped being able to eat, and went to an urgent care who sent him to the hospital for his blood sugar. He reports that 5 years ago after his wife passed he "fell apart" and has spent 125k on nothing in particular in that time frame. He has been in a motel for almost 6 months, and is looking forward to moving into an apartment soon, states "I'm building my life back up". He is hopeful  he will get some assistance in the form of home health.  He was surprised when told he had a high WBC count, stating he had been told about a potential pneumonia but not the WBC count.   He reports intermittent depression since his wife died, stating he was married for 19 years. He reports that he had thought about getting a girlfriend, but is not sure if he will  be able to find love again. He had been put on quetiapine recently due to his "daughter's meddling" but doesn't take it due to oversedation in the AM. He is future oriented throughout exam and discusses his plans to go to pain mgmt in future for his neuropathy. He adamantly denied any behavior changes in the past several months and denied drinking, drugs, etc.   We did discuss possible medication options for his depression besides quetiapine, which he declined. He consented after some reluctance to a MOCA, scored 26/30 (missed 1 point on language and 5 points on delayed recall)   ROS:  Pertinent positives listed above. Specifically denied any respiratory symptoms.   Collateral information:  Per EMR  Psychiatric History:  Information collected from pt  Depression after wife died "I pulled myself out of it".  Has been on quetiapine (oversedation), unclear what else he has been on besides sertraline No hx SI, HI, AH/VH, psych hospital stay, therapy  Family psych history: none  Medical History: Past Medical History:  Diagnosis Date  . Anxiety   . BPH (benign prostatic hyperplasia)   . Cervical spine disease   . Chronic kidney disease    kidney stones  . Depression   . Disorder of lumbar spine   . ETOH abuse   . GI bleed   . Muscle spasm     Surgical History: Past Surgical History:  Procedure Laterality Date  . COLONOSCOPY WITH PROPOFOL N/A 11/08/2016   Procedure: COLONOSCOPY WITH PROPOFOL;  Surgeon: Wilford Corner, MD;  Location: [facility];  Service: Endoscopy;  Laterality: N/A;  . CORONARY STENT INTERVENTION N/A 04/25/2021   Procedure: CORONARY STENT INTERVENTION;  Surgeon: Martinique, Peter M, MD;  Location: [facility];  Service: Cardiovascular;  Laterality: N/A;  RCA  . ELBOW SURGERY     as child  . ELBOW SURGERY    . ENTEROSCOPY N/A 11/19/2016   Procedure: ENTEROSCOPY;  Surgeon: Wonda Horner, MD;  Location: [facility];  Service: Endoscopy;  Laterality: N/A;  .  ESOPHAGOGASTRODUODENOSCOPY N/A 10/04/2016   Procedure: ESOPHAGOGASTRODUODENOSCOPY (EGD);  Surgeon: Carol Ada, MD;  Location: [facility];  Service: Gastroenterology;  Laterality: N/A;  . GIVENS CAPSULE STUDY N/A 11/09/2016   Procedure: GIVENS CAPSULE STUDY;  Surgeon: Wilford Corner, MD;  Location: [facility];  Service: Endoscopy;  Laterality: N/A;  . LEFT HEART CATH AND CORONARY ANGIOGRAPHY N/A 04/25/2021   Procedure: LEFT HEART CATH AND CORONARY ANGIOGRAPHY;  Surgeon: Martinique, Peter M, MD;  Location: [facility];  Service: Cardiovascular;  Laterality: N/A;    Medications:   Current Facility-Administered Medications:  .  acetaminophen (TYLENOL) tablet 650 mg, 650 mg, Oral, Q6H PRN, Blount, Xenia T, NP, 650 mg at 08/12/21 0526 .  amoxicillin-clavulanate (AUGMENTIN) 875-125 MG per tablet 1 tablet, 1 tablet, Oral, Q12H, Adhikari, Amrit, MD, 1 tablet at 08/14/21 1327 .  apixaban (ELIQUIS) tablet 5 mg, 5 mg, Oral, BID, Tu, Ching T, DO, 5 mg at 08/14/21 1002 .  atorvastatin (LIPITOR) tablet 80 mg, 80 mg, Oral, Daily, Adhikari, Amrit, MD, 80 mg at 08/14/21 1002 .  Chlorhexidine Gluconate Cloth 2 % PADS 6 each, 6 each, Topical, Daily, Tu, Ching T, DO, 6 each at 08/13/21 1030 .  dextrose 50 % solution 0-50 mL, 0-50 mL, Intravenous, PRN, Tegeler, Gwenyth Allegra, MD .  diphenhydrAMINE (BENADRYL) capsule 25 mg, 25 mg, Oral, Q6H PRN, Adhikari, Amrit, MD .  fenofibrate tablet 160 mg, 160 mg, Oral, Daily, Adhikari, Amrit, MD, 160 mg at 08/14/21 1002 .  folic acid (FOLVITE) tablet 1 mg, 1 mg, Oral, Daily, Adhikari, Amrit, MD, 1 mg at 08/14/21 1002 .  gabapentin (NEURONTIN) capsule 600 mg, 600 mg, Oral, TID, Tawanna Solo, Amrit, MD, 600 mg at 08/14/21 1530 .  insulin aspart (novoLOG) injection 0-15 Units, 0-15 Units, Subcutaneous, TID WC, Blount, Xenia T, NP, 5 Units at 08/14/21 1327 .  insulin aspart (novoLOG) injection 4 Units, 4 Units, Subcutaneous, TID WC, Shelly Coss, MD, 4 Units at 08/14/21  1334 .  insulin glargine-yfgn (SEMGLEE) injection 20 Units, 20 Units, Subcutaneous, Daily, Shelly Coss, MD, 20 Units at 08/14/21 1006 .  nicotine (NICODERM CQ - dosed in mg/24 hours) patch 14 mg, 14 mg, Transdermal, Daily, Adhikari, Amrit, MD, 14 mg at 08/14/21 1001 .  pantoprazole (PROTONIX) EC tablet 40 mg, 40 mg, Oral, Daily, Adhikari, Amrit, MD, 40 mg at 08/14/21 1002 .  polyethylene glycol (MIRALAX / GLYCOLAX) packet 17 g, 17 g, Oral, Daily, Adhikari, Amrit, MD, 17 g at 08/14/21 1002 .  polyvinyl alcohol (LIQUIFILM TEARS) 1.4 % ophthalmic solution 1 drop, 1 drop, Both Eyes, PRN, Shelly Coss, MD, 1 drop at 08/13/21 2224 .  thiamine tablet 100 mg, 100 mg, Oral, Daily, Adhikari, Amrit, MD, 100 mg at 08/14/21 1003  Allergies: No Known Allergies  Social History:  Living in motel  Tobacco use: yes per chart Alcohol use: denies, however daughter states significant EtOH use - on thiamine Drug use: denies  Family History:  The patient's family history includes Alcohol abuse in his brother; Cancer in his brother; Cancer (age of onset: 16) in his mother; Colon cancer in his brother and mother; Colon polyps in his brother; Early death in his brother; Prostate cancer in his brother.    Objective  Vital signs:  Temp:  [97.5 F (36.4 C)-97.8 F (36.6 C)] 97.5 F (36.4 C) (12/05 1343) Pulse Rate:  [65-68] 65 (12/05 1343) Resp:  [16-18] 16 (12/05 1343) BP: (98-126)/(65-115) 98/65 (12/05 1343) SpO2:  [97 %-98 %] 97 % (12/05 1343)  Physical Exam: Gen: Appears stated age, wearing glasses.  Head: normocephalic/atraumatic Pulm: no increased WOB Psych: alert, oriented  Psychiatric Specialty Exam:  Presentation  General Appearance: Appropriate for Environment; Casual Eye Contact:Good Speech:Clear and Coherent (repeats "Okay" at end of many sentences) Speech Volume:Normal Handedness:No data recorded  Mood and Affect  Mood:-- (OK, sometimes depressed) Affect:Appropriate;  Congruent; Full Range  Thought Process  Thought Processes:Coherent; Goal Directed Descriptions of Associations:Intact Orientation:Full (Time, Place and Person) Thought Content:-- (devoid of SI, HI, delusions, paranoia. A bit repetitive but not ruminative.) History of Schizophrenia/Schizoaffective disorder:No data recorded Duration of Psychotic Symptoms:No data recorded Hallucinations:Hallucinations: None Ideas of Reference:None Suicidal Thoughts:Suicidal Thoughts: No Homicidal Thoughts:Homicidal Thoughts: No  Sensorium  Memory:Immediate Poor; Recent Good; Remote Good Judgment:Fair Insight:Fair  Executive Functions  Concentration:Fair Attention Span:Good [place] of Knowledge:Good Language:Good  Psychomotor Activity  Psychomotor Activity:Psychomotor Activity: Normal  Assets  Assets:Desire for Improvement; Social Support  Sleep  Sleep:Sleep: Fair   Blood pressure 98/65, pulse 65, temperature (!) 97.5 F (36.4 C), temperature source Oral, resp. rate 16, height 6' (1.829 m), weight  95 kg, SpO2 97 %. Body mass index is 28.4 kg/m.

## 2021-08-14 NOTE — Progress Notes (Signed)
Patient refuses to wear Telemetry. Dr. Tawanna Solo aware. Telemetry discontinued.

## 2021-08-15 LAB — GLUCOSE, CAPILLARY
Glucose-Capillary: 363 mg/dL — ABNORMAL HIGH (ref 70–99)
Glucose-Capillary: 133 mg/dL — ABNORMAL HIGH (ref 70–99)

## 2021-08-15 LAB — CBC WITH DIFFERENTIAL/PLATELET
Abs Immature Granulocytes: 0.09 10*3/uL — ABNORMAL HIGH (ref 0.00–0.07)
Basophils Absolute: 0.1 10*3/uL (ref 0.0–0.1)
Basophils Relative: 0 %
Eosinophils Absolute: 0.2 10*3/uL (ref 0.0–0.5)
Eosinophils Relative: 1 %
HCT: 40.2 % (ref 39.0–52.0)
Hemoglobin: 14 g/dL (ref 13.0–17.0)
Immature Granulocytes: 0 %
Lymphocytes Relative: 68 %
Lymphs Abs: 15.3 10*3/uL — ABNORMAL HIGH (ref 0.7–4.0)
MCH: 32.2 pg (ref 26.0–34.0)
MCHC: 34.8 g/dL (ref 30.0–36.0)
MCV: 92.4 fL (ref 80.0–100.0)
Monocytes Absolute: 1 10*3/uL (ref 0.1–1.0)
Monocytes Relative: 4 %
Neutro Abs: 6.1 10*3/uL (ref 1.7–7.7)
Neutrophils Relative %: 27 %
Platelets: 301 10*3/uL (ref 150–400)
RBC: 4.35 MIL/uL (ref 4.22–5.81)
RDW: 13.3 % (ref 11.5–15.5)
WBC: 22.8 10*3/uL — ABNORMAL HIGH (ref 4.0–10.5)
nRBC: 0.1 % (ref 0.0–0.2)

## 2021-08-15 LAB — HEMOGLOBIN A1C
Hgb A1c MFr Bld: >15.5 % — ABNORMAL HIGH (ref 4.8–5.6)
Mean Plasma Glucose: >398 mg/dL

## 2021-08-15 LAB — HIV ANTIBODY (ROUTINE TESTING W REFLEX): HIV Screen 4th Generation wRfx: NONREACTIVE

## 2021-08-15 LAB — RPR: RPR Ser Ql: NONREACTIVE

## 2021-08-15 MED ORDER — GABAPENTIN 300 MG PO CAPS: 600.0000 mg | ORAL_CAPSULE | Freq: Three times a day (TID) | ORAL | 0 refills | Status: AC

## 2021-08-15 MED ORDER — INSULIN ASPART 100 UNIT/ML IJ SOLN
7.0000 [IU] | Freq: Three times a day (TID) | INTRAMUSCULAR | Status: DC
  Administered 2021-08-15: 7 [IU] via SUBCUTANEOUS

## 2021-08-15 MED ORDER — INSULIN GLARGINE-YFGN 100 UNIT/ML ~~LOC~~ SOLN
30.0000 [IU] | Freq: Every day | SUBCUTANEOUS | Status: DC
  Administered 2021-08-15: 30 [IU] via SUBCUTANEOUS
  Filled 2021-08-15 (×2): qty 0.3

## 2021-08-15 MED ORDER — GLIPIZIDE 10 MG PO TABS: 10.0000 mg | ORAL_TABLET | Freq: Every day | ORAL | 1 refills | Status: AC

## 2021-08-15 MED ORDER — METOPROLOL SUCCINATE ER 25 MG PO TB24: 12.5000 mg | ORAL_TABLET | Freq: Every day | ORAL | 6 refills | Status: AC

## 2021-08-15 MED ORDER — GABAPENTIN 300 MG PO CAPS: 600.0000 mg | ORAL_CAPSULE | Freq: Three times a day (TID) | ORAL | 0 refills | Status: DC

## 2021-08-15 MED ORDER — GLIPIZIDE 10 MG PO TABS: 10.0000 mg | ORAL_TABLET | Freq: Every day | ORAL | 1 refills | Status: DC

## 2021-08-15 MED ORDER — METFORMIN HCL ER 500 MG PO TB24: 500.0000 mg | ORAL_TABLET | Freq: Every day | ORAL | 1 refills | Status: AC

## 2021-08-15 MED ORDER — METFORMIN HCL ER 500 MG PO TB24: 500.0000 mg | ORAL_TABLET | Freq: Every day | ORAL | 1 refills | Status: DC

## 2021-08-15 NOTE — Progress Notes (Signed)
Inpatient Diabetes Program Recommendations  AACE/ADA: New Consensus Statement on Inpatient Glycemic Control (2015)  Target Ranges:  Prepandial:   less than 140 mg/dL      Peak postprandial:   less than 180 mg/dL (1-2 hours)      Critically ill patients:  140 - 180 mg/dL   Lab Results  Component Value Date   GLUCAP 363 (H) 08/15/2021   HGBA1C 7.9 (H) 04/25/2021    Review of Glycemic Control  Diabetes history: DM2 Outpatient Diabetes medications: metformin 500 mg BID (not taking) Current orders for Inpatient glycemic control: Semglee 30 units QD, Novolog 0-15 units TID with meals + 7 units TID  No HgbA1C results. CBGs over past 24H: 205-339 mg/dL Eating 100%. Per RN pt is eating out of vending machine.  Inpatient Diabetes Program Recommendations:    Spoke with pt at bedside. Pt said he was not taking metformin because of GI side effects. Has meter but does not know where it is. States he's "in-between" places and has all of his belongings in the car. When attempting to discuss importance of controlling blood sugars, taking meds and f/u with PCP, he interrupted and talked about all of these studies on you tube. I told him that the bottom line is he needs a PCP to manage his diabetes, needs meter to know what his blood sugars are, and needs insulin to control them. Pt very opposed to insulin. This Coordinator does not feel comfortable sending pt home on insulin without any follow-up. Shared concern with pt, although he continued to talk about random studies he wanted me to read on you tube. Did not make progress with trying to educate him.   Discussed above with RN. May need TOC to assist with finding PCP.  Thank you. Lorenda Peck, RD, LDN, CDE Inpatient Diabetes Coordinator [phone]

## 2021-08-15 NOTE — Discharge Summary (Signed)
Physician Discharge Summary  Barry Mccoy OVF:[id number] DOB: 1950-11-26 DOA: 08/11/2021  PCP: System, Provider Not In  Admit date: 08/11/2021 Discharge date: 08/15/2021  Admitted From: Home Disposition:  Home  Discharge Condition:Stable CODE STATUS:FULL Diet recommendation:  Carb Modified  Brief/Interim Summary:  Patient is a 70-year male with history of coronary artery disease/STEMI, type 2 diabetes, paroxysmal A. fib, left lower extremity DVT on Eliquis, MGUS, history of GI bleed, GERD, hyperlipidemia who presented with generalized malaise.  He was not feeling well for last couple of days.  He was diagnosed with extensive left lower extremity DVT on 11/17 and was started on Eliquis.  On presentation, he was hypotensive, white cell count of 21,000, lactate level was normal.  Sodium was 126, anion gap was elevated at 15, elevated beta hydroxybutyrate.  Chest x-ray showed left lower lobe artifact versus infiltrate.  Patient was suspected to have pneumonia and was started on Rocephin and azithromycin.  Also started on IV insulin for DKA. His overall status has been stable now.  His sugars continue to high because of his erratic eating habits.  He is very noncompliant and does not want to take any medicines for his diabetes and believes on other remedies rather than medicines.  He clearly declined insulin intake on discharge.  Diabetic coordinator was following.  Hemoglobin A1c level sent 2 days ago still pending.  Medically stable for discharge to home today with oral diabetic medications.  Following problems were addressed during her hospitalization:  DKA: Last hemoglobin A1c of 7.9 as of 8/22.  Started on insulin drip.  Anion gap has closed.  We will continue sliding scale and Lantus.  Monitor blood sugars.  Check hemoglobin A1c.Still pending.  Takes metformin at home.  Patient states he does not want to take metformin anymore because it causes diarrhea.  Diabetic coordinator consulted. His sugars  continue to high because of his erratic eating habits.  He is very noncompliant and does not want to take any medicines for his diabetes and believes on other remedies rather than medicines.  He clearly declined insulin intake on discharge.   Suspected sepsis: Ruled out.  Presented with hypotension, elevated white cell count.  Blood cultures : NGTD.   Procalcitonin nonreassuring.      Suspected community-acquired pneumonia: Chest x-ray showed possible artifact versus infiltrate in the left lower lobe.  No respiratory symptoms.  Procalcitonin nonreassuring. Abx stopped   Leukocytosis: Unexplained etiology.   On looking on his previous blood work, he has been having leukocytosis since August  2022.  He needs to follow-up with hematology as an outpatient.  I discussed with Dr. Lorenso Courier about this and he will be arranging outpatient follow-up   Combined systolic/diastolic congestive heart failure: Currently euvolemic.  Echo on 8/22 showed EF of 35 to 84%, grade 1 diastolic dysfunction.  Takes Entresto, metoprolol.  These are on hold because of hypotension.  Will restart metoprolol at low dose.  He needs to follow-up with his cardiologist as an outpatient   History of recent left lower extremity DVT: On Eliquis   History of hyperlipidemia: Takes Lipitor at home.  Also on fenofibrate   History of paroxysmal A. fib:   On Eliquis for anticoagulation.  Previously not taking anticoagulant due to history of GI bleed.  Also takes metoprolol for rate control.   History of BPH: Tamsulosin   Tobacco use: He smokes a pack a day.  Counseled for cessation.  Continue nicotine patch   Alcohol use: Patient declines alcohol use but  daughter states she has been regular drinking alcohol.  Continue thiamine and folic acid   Behavioral change: daughter has been concerned about his excessive alcohol intake, bizarre personality, possible hypersexuality, obscene behaviors in the public, aggressiveness.  We consulted  psychiatry as per daughter's request.  He declined psychiatric evaluation and said that he did not have any psychiatric illness.  He has capacity to make decisions and he is alert and oriented.         Discharge Diagnoses:  Principal Problem:   DKA (diabetic ketoacidosis) ([place]) Active Problems:   Hypotension   Leukocytosis   DVT (deep venous thrombosis) ([facility])   AF (paroxysmal atrial fibrillation) ([facility])    Discharge Instructions  Discharge Instructions     Diet - low sodium heart healthy   Complete by: As directed    Discharge instructions   Complete by: As directed    1)Please take prescribed medications as instructed 2)Monitor your blood sugars at home 3)Follow up with your PCP in a week.  Follow-up with your cardiologist in 2 weeks. 4)You have been referred to a hematologist for evaluation of  persistent white cell count elevation.  You will be called for follow-up appointment.   Increase activity slowly   Complete by: As directed       Allergies as of 08/15/2021   No Known Allergies      Medication List     STOP taking these medications    Entresto 24-26 MG Generic drug: sacubitril-valsartan   metFORMIN 500 MG tablet Commonly known as: Glucophage Replaced by: metFORMIN 500 MG 24 hr tablet       TAKE these medications    acetaminophen 325 MG tablet Commonly known as: TYLENOL Take 2 tablets (650 mg total) by mouth every 6 (six) hours as needed for mild pain, moderate pain or headache (or Fever >/= 101).   Apixaban Starter Pack (10mg  and 5mg ) Commonly known as: ELIQUIS STARTER PACK Take as directed on package: start with two-5mg  tablets twice daily for 7 days. On day 8, switch to one-5mg  tablet twice daily. What changed:  how much to take how to take this when to take this additional instructions   atorvastatin 80 MG tablet Commonly known as: LIPITOR Take 1 tablet (80 mg total) by mouth daily.   cyanocobalamin 1000 MCG tablet Take 1 tablet  (1,000 mcg total) by mouth daily.   fenofibrate 160 MG tablet Take 1 tablet (160 mg total) by mouth daily.   folic acid 1 MG tablet Commonly known as: FOLVITE Take 1 tablet (1 mg total) by mouth daily.   gabapentin 300 MG capsule Commonly known as: NEURONTIN Take 2 capsules (600 mg total) by mouth 3 (three) times daily. What changed: how much to take   glipiZIDE 10 MG tablet Commonly known as: GLUCOTROL Take 1 tablet (10 mg total) by mouth daily before breakfast.   iron polysaccharides 150 MG capsule Commonly known as: NIFEREX Take 1 capsule (150 mg total) by mouth 2 (two) times daily.   lidocaine 5 % Commonly known as: LIDODERM Place 1 patch onto the skin every 12 (twelve) hours as needed (pain).   Lumify 0.025 % Soln Generic drug: Brimonidine Tartrate Place 1 drop into both eyes daily as needed (redness).   metFORMIN 500 MG 24 hr tablet Commonly known as: GLUCOPHAGE-XR Take 1 tablet (500 mg total) by mouth daily with breakfast. Replaces: metFORMIN 500 MG tablet   metoprolol succinate 25 MG 24 hr tablet Commonly known as: TOPROL-XL Take 0.5 tablets (  12.5 mg total) by mouth daily. What changed: how much to take   multivitamin with minerals Tabs tablet Take 1 tablet by mouth at bedtime.   nitroGLYCERIN 0.4 MG SL tablet Commonly known as: NITROSTAT Place 1 tablet (0.4 mg total) under the tongue every 5 (five) minutes x 3 doses as needed for chest pain.   oxybutynin 5 MG 24 hr tablet Commonly known as: DITROPAN-XL Take 1 tablet by mouth daily.   pantoprazole 40 MG tablet Commonly known as: PROTONIX Take 1 tablet (40 mg total) by mouth daily.   QUEtiapine 50 MG tablet Commonly known as: SEROQUEL TAKE 1 TABLET (50 MG TOTAL) BY MOUTH AT BEDTIME.   thiamine 100 MG tablet Take 1 tablet (100 mg total) by mouth daily. For 4days then down to 100mg  daily What changed:  when to take this additional instructions   traMADol 50 MG tablet Commonly known as:  ULTRAM Take 1 tablet (50 mg total) by mouth every 6 (six) hours as needed.   traZODone 100 MG tablet Commonly known as: DESYREL Take 1 tablet (100 mg total) by mouth at bedtime.   Vitamin D-3 125 MCG (5000 UT) Tabs Take by mouth daily.        Follow-up Information     Martinique, Peter M, MD Follow up.   Specialty: Cardiology Contact information: [address] [place] [address] [phone]                No Known Allergies  Consultations: psychiatry   Procedures/Studies: DG Chest Portable 1 View  Result Date: 08/11/2021 CLINICAL DATA:  Fatigue, hyperglycemia EXAM: PORTABLE CHEST 1 VIEW COMPARISON:  06/24/2021 FINDINGS: Cardiac size is within normal limits. Mediastinum is unremarkable. There are no signs of alveolar pulmonary edema or focal pulmonary consolidation. There is faint haziness with air soft tissue interface in the left lower lung fields. Lung markings are seen extending lateral to this interface. Costophrenic angles are clear. There is no definite pneumothorax. There are linear densities in both apices, possibly scarring. IMPRESSION: There are no signs of pulmonary edema or focal pulmonary consolidation. There is small faint radiopacity with air soft tissue interface in the its lateral margin. This may suggest an artifact such as skin fold or early infiltrate in the left lower lung fields. Short-term follow-up PA and lateral views of chest may be considered. Electronically Signed   By: Elmer Picker M.D.   On: 08/11/2021 16:45   VAS [place] LOWER EXTREMITY VENOUS (DVT) (7a-7p)  Result Date: 07/27/2021  Lower Venous DVT Study Patient Name:  Barry Mccoy  Date of Exam:   07/27/2021 Medical Rec #: [id number]   Accession #:    [id number] Date of Birth: 1951/08/29   Patient Gender: M Patient Age:   46 years Exam Location:  [facility] Procedure:      VAS [place] LOWER EXTREMITY VENOUS (DVT) Referring Phys: ABIGAIL HARRIS  --------------------------------------------------------------------------------  Indications: Left calf pain.  Comparison Study: No previous exams Performing Technologist: Jody Hill RVT, RDMS  Examination Guidelines: A complete evaluation includes B-mode imaging, spectral Doppler, color Doppler, and power Doppler as needed of all accessible portions of each vessel. Bilateral testing is considered an integral part of a complete examination. Limited examinations for reoccurring indications may be performed as noted. The reflux portion of the exam is performed with the patient in reverse Trendelenburg.  +-----+---------------+---------+-----------+----------+--------------+ RIGHTCompressibilityPhasicitySpontaneityPropertiesThrombus Aging +-----+---------------+---------+-----------+----------+--------------+ CFV  Full           Yes  Yes                                 +-----+---------------+---------+-----------+----------+--------------+   +---------+---------------+---------+-----------+----------+--------------+ LEFT     CompressibilityPhasicitySpontaneityPropertiesThrombus Aging +---------+---------------+---------+-----------+----------+--------------+ CFV      Full           Yes      Yes                                 +---------+---------------+---------+-----------+----------+--------------+ SFJ      Partial                                      Acute          +---------+---------------+---------+-----------+----------+--------------+ FV Prox  Full           Yes      Yes                                 +---------+---------------+---------+-----------+----------+--------------+ FV Mid   Full                                                        +---------+---------------+---------+-----------+----------+--------------+ FV DistalNone           No       No                   Acute           +---------+---------------+---------+-----------+----------+--------------+ PFV      None           No       No                   Acute          +---------+---------------+---------+-----------+----------+--------------+ POP      None           No       No                   Acute          +---------+---------------+---------+-----------+----------+--------------+ PTV      None           No       No                   Acute          +---------+---------------+---------+-----------+----------+--------------+ PERO     None           No       No                   Acute          +---------+---------------+---------+-----------+----------+--------------+ Gastroc  None           No       No                   Acute          +---------+---------------+---------+-----------+----------+--------------+ SSV      None  No       No                   Acute          +---------+---------------+---------+-----------+----------+--------------+    Summary: RIGHT: - No evidence of common femoral vein obstruction.  LEFT: - Findings consistent with acute deep vein thrombosis involving the SF junction, left femoral vein, left proximal profunda vein, left popliteal vein, left posterior tibial veins, left peroneal veins, and left gastrocnemius veins. - Findings consistent with acute superficial vein thrombosis involving the left small saphenous vein. - No cystic structure found in the popliteal fossa.  *See table(s) above for measurements and observations. Electronically signed by Orlie Pollen on 07/27/2021 at 7:03:39 PM.    Final       Subjective: Patient seen and examined at the bedside this morning.  Hemodynamically stable for discharge today  Discharge Exam: Vitals:   08/14/21 2220 08/15/21 0551  BP: (!) 106/51 (!) 97/56  Pulse: 67 76  Resp: 20 19  Temp:  97.9 F (36.6 C)  SpO2: 97% 95%   Vitals:   08/14/21 0438 08/14/21 1343 08/14/21 2220 08/15/21 0551  BP: (!) 126/115  98/65 (!) 106/51 (!) 97/56  Pulse: 68 65 67 76  Resp: 18 16 20 19   Temp: 97.8 F (36.6 C) (!) 97.5 F (36.4 C)  97.9 F (36.6 C)  TempSrc: Oral Oral  Oral  SpO2: 98% 97% 97% 95%  Weight:      Height:        General: Pt is alert, awake, not in acute distress Cardiovascular: RRR, S1/S2 +, no rubs, no gallops Respiratory: CTA bilaterally, no wheezing, no rhonchi Abdominal: Soft, NT, ND, bowel sounds + Extremities: no edema, no cyanosis    The results of significant diagnostics from this hospitalization (including imaging, microbiology, ancillary and laboratory) are listed below for reference.     Microbiology: Recent Results (from the past 240 hour(s))  Urine Culture     Status: Abnormal   Collection Time: 08/11/21  4:13 PM   Specimen: Urine, Clean Catch  Result Value Ref Range Status   Specimen Description   Final    URINE, CLEAN CATCH Performed at [facility], [place] [address] [place], [place] [address]    Special Requests   Final    NONE Performed at [facility], [place] [address], [place], [place] [address]    Culture (A)  Final    30,000 COLONIES/mL STREPTOCOCCUS AGALACTIAE TESTING AGAINST S. AGALACTIAE NOT ROUTINELY PERFORMED DUE TO PREDICTABILITY OF AMP/PEN/VAN SUSCEPTIBILITY. Performed at [facility], [place] [address], [place], [place] [address]    Report Status 08/13/2021 FINAL  Final  Resp Panel by RT-PCR (Flu A&B, Covid) Nasopharyngeal Swab     Status: None   Collection Time: 08/11/21  4:16 PM   Specimen: Nasopharyngeal Swab; Nasopharyngeal(NP) swabs in vial transport medium  Result Value Ref Range Status   SARS Coronavirus 2 by RT PCR NEGATIVE NEGATIVE Final    Comment: (NOTE) SARS-CoV-2 target nucleic acids are NOT DETECTED.  The SARS-CoV-2 RNA is generally detectable in upper respiratory specimens during the acute phase of infection. The lowest concentration of SARS-CoV-2 viral copies this assay can detect  is 138 copies/mL. A negative result does not preclude SARS-Cov-2 infection and should not be used as the sole basis for treatment or other patient management decisions. A negative result may occur with  improper specimen collection/handling, submission of specimen other than nasopharyngeal  swab, presence of viral mutation(s) within the areas targeted by this assay, and inadequate number of viral copies(<138 copies/mL). A negative result must be combined with clinical observations, patient history, and epidemiological information. The expected result is Negative.  Fact Sheet for Patients:  EntrepreneurPulse.com.au  Fact Sheet for Healthcare Providers:  IncredibleEmployment.be  This test is no t yet approved or cleared by the [place] FDA and  has been authorized for detection and/or diagnosis of SARS-CoV-2 by FDA under an Emergency Use Authorization (EUA). This EUA will remain  in effect (meaning this test can be used) for the duration of the COVID-19 declaration under Section 564(b)(1) of the Act, 21 U.S.C.section 360bbb-3(b)(1), unless the authorization is terminated  or revoked sooner.       Influenza A by PCR NEGATIVE NEGATIVE Final   Influenza B by PCR NEGATIVE NEGATIVE Final    Comment: (NOTE) The Xpert Xpress SARS-CoV-2/FLU/RSV plus assay is intended as an aid in the diagnosis of influenza from Nasopharyngeal swab specimens and should not be used as a sole basis for treatment. Nasal washings and aspirates are unacceptable for Xpert Xpress SARS-CoV-2/FLU/RSV testing.  Fact Sheet for Patients: EntrepreneurPulse.com.au  Fact Sheet for Healthcare Providers: IncredibleEmployment.be  This test is not yet approved or cleared by the [place] FDA and has been authorized for detection and/or diagnosis of SARS-CoV-2 by FDA under an Emergency Use Authorization (EUA). This EUA will remain in effect  (meaning this test can be used) for the duration of the COVID-19 declaration under Section 564(b)(1) of the Act, 21 U.S.C. section 360bbb-3(b)(1), unless the authorization is terminated or revoked.  Performed at [facility], [place] [address] [place], [place] [address]   Blood culture (routine x 2)     Status: None (Preliminary result)   Collection Time: 08/11/21  7:43 PM   Specimen: BLOOD  Result Value Ref Range Status   Specimen Description   Final    BLOOD LEFT ANTECUBITAL Performed at [facility] [address] [place], [place] [address]    Special Requests   Final    BOTTLES DRAWN AEROBIC AND ANAEROBIC Blood Culture results may not be optimal due to an excessive volume of blood received in culture bottles Performed at [facility] [address], [place], [place] [address]    Culture   Final    NO GROWTH 3 DAYS Performed at [facility], [place] [address], [place], [place] [address]    Report Status PENDING  Incomplete  Blood culture (routine x 2)     Status: None (Preliminary result)   Collection Time: 08/11/21  7:43 PM   Specimen: BLOOD  Result Value Ref Range Status   Specimen Description   Final    BLOOD RIGHT WRIST Performed at [place] [address], [place], [place] [address]    Special Requests   Final    BOTTLES DRAWN AEROBIC AND ANAEROBIC Blood Culture results may not be optimal due to an inadequate volume of blood received in culture bottles Performed at [facility] [address], [place], [place] [address]    Culture   Final    NO GROWTH 3 DAYS Performed at [facility], [place] [address] [place], [place] [address]    Report Status PENDING  Incomplete  MRSA Next Gen by PCR, Nasal     Status: None   Collection Time: 08/11/21 11:24 PM   Specimen: Nasal Mucosa; Nasal Swab  Result  Value Ref Range Status   MRSA by PCR Next Gen NOT DETECTED NOT  DETECTED Final    Comment: (NOTE) The GeneXpert MRSA Assay (FDA approved for NASAL specimens only), is one component of a comprehensive MRSA colonization surveillance program. It is not intended to diagnose MRSA infection nor to guide or monitor treatment for MRSA infections. Test performance is not FDA approved in patients less than 22 years old. Performed at [facility], [place] [address], [place], [place] [address]      Labs: BNP (last 3 results) No results for input(s): BNP in the last 8760 hours. Basic Metabolic Panel: Recent Labs  Lab 08/11/21 1614 08/11/21 2004 08/12/21 0004 08/12/21 0341 08/13/21 0615 08/14/21 0856  NA  --  130* 133* 135 127* 130*  K  --  4.5 3.5 3.2* 3.8 4.2  CL  --  92* 98 100 100 101  CO2  --  24 24 27 22 25   GLUCOSE  --  455* 268* 150* 267* 239*  BUN  --  16 13 12 9 8   CREATININE  --  0.85 0.68 0.64 0.58* 0.63  CALCIUM  --  9.2 8.7* 8.7* 8.3* 8.6*  MG 2.3  --   --   --   --   --    Liver Function Tests: Recent Labs  Lab 08/11/21 1558  AST 15  ALT 15  ALKPHOS 95  BILITOT 1.1  PROT 7.5  ALBUMIN 4.0   Recent Labs  Lab 08/11/21 1614  LIPASE 58*   Recent Labs  Lab 08/11/21 1616  AMMONIA 22   CBC: Recent Labs  Lab 08/11/21 1558 08/13/21 0615 08/14/21 0856 08/15/21 0532  WBC 21.0* 19.6* 21.8* 22.8*  NEUTROABS 13.0* 10.6* 4.3 6.1  HGB 16.6 15.0 15.1 14.0  HCT 48.9 43.1 43.8 40.2  MCV 92.3 91.9 92.6 92.4  PLT 385 285 311 301   Cardiac Enzymes: No results for input(s): CKTOTAL, CKMB, CKMBINDEX, TROPONINI in the last 168 hours. BNP: Invalid input(s): POCBNP CBG: Recent Labs  Lab 08/14/21 1151 08/14/21 1627 08/14/21 2222 08/15/21 0728 08/15/21 1211  GLUCAP 205* 247* 339* 363* 133*   D-Dimer No results for input(s): DDIMER in the last 72 hours. Hgb A1c No results for input(s): HGBA1C in the last 72 hours. Lipid Profile No results for input(s): CHOL, HDL, LDLCALC, TRIG, CHOLHDL, LDLDIRECT in the  last 72 hours. Thyroid function studies No results for input(s): TSH, T4TOTAL, T3FREE, THYROIDAB in the last 72 hours.  Invalid input(s): FREET3 Anemia work up Recent Labs    08/14/21 1820  VITAMINB12 431   Urinalysis    Component Value Date/Time   COLORURINE STRAW (A) 08/11/2021 1613   APPEARANCEUR CLEAR 08/11/2021 1613   LABSPEC 1.029 08/11/2021 1613   PHURINE 5.0 08/11/2021 1613   GLUCOSEU >=500 (A) 08/11/2021 1613   HGBUR NEGATIVE 08/11/2021 1613   BILIRUBINUR NEGATIVE 08/11/2021 1613   KETONESUR 5 (A) 08/11/2021 1613   PROTEINUR NEGATIVE 08/11/2021 1613   NITRITE NEGATIVE 08/11/2021 1613   LEUKOCYTESUR NEGATIVE 08/11/2021 1613   Sepsis Labs Invalid input(s): PROCALCITONIN,  WBC,  LACTICIDVEN Microbiology Recent Results (from the past 240 hour(s))  Urine Culture     Status: Abnormal   Collection Time: 08/11/21  4:13 PM   Specimen: Urine, Clean Catch  Result Value Ref Range Status   Specimen Description   Final    URINE, CLEAN CATCH Performed at [facility], [place] [address], [place], [place] [address]    Special Requests  Final    NONE Performed at [facility], [place] [address], [place], [place] [address]    Culture (A)  Final    30,000 COLONIES/mL STREPTOCOCCUS AGALACTIAE TESTING AGAINST S. AGALACTIAE NOT ROUTINELY PERFORMED DUE TO PREDICTABILITY OF AMP/PEN/VAN SUSCEPTIBILITY. Performed at [facility], [place] [address] [place], [place] [address]    Report Status 08/13/2021 FINAL  Final  Resp Panel by RT-PCR (Flu A&B, Covid) Nasopharyngeal Swab     Status: None   Collection Time: 08/11/21  4:16 PM   Specimen: Nasopharyngeal Swab; Nasopharyngeal(NP) swabs in vial transport medium  Result Value Ref Range Status   SARS Coronavirus 2 by RT PCR NEGATIVE NEGATIVE Final    Comment: (NOTE) SARS-CoV-2 target nucleic acids are NOT DETECTED.  The SARS-CoV-2 RNA is generally detectable in upper respiratory specimens during  the acute phase of infection. The lowest concentration of SARS-CoV-2 viral copies this assay can detect is 138 copies/mL. A negative result does not preclude SARS-Cov-2 infection and should not be used as the sole basis for treatment or other patient management decisions. A negative result may occur with  improper specimen collection/handling, submission of specimen other than nasopharyngeal swab, presence of viral mutation(s) within the areas targeted by this assay, and inadequate number of viral copies(<138 copies/mL). A negative result must be combined with clinical observations, patient history, and epidemiological information. The expected result is Negative.  Fact Sheet for Patients:  EntrepreneurPulse.com.au  Fact Sheet for Healthcare Providers:  IncredibleEmployment.be  This test is no t yet approved or cleared by the [place] FDA and  has been authorized for detection and/or diagnosis of SARS-CoV-2 by FDA under an Emergency Use Authorization (EUA). This EUA will remain  in effect (meaning this test can be used) for the duration of the COVID-19 declaration under Section 564(b)(1) of the Act, 21 U.S.C.section 360bbb-3(b)(1), unless the authorization is terminated  or revoked sooner.       Influenza A by PCR NEGATIVE NEGATIVE Final   Influenza B by PCR NEGATIVE NEGATIVE Final    Comment: (NOTE) The Xpert Xpress SARS-CoV-2/FLU/RSV plus assay is intended as an aid in the diagnosis of influenza from Nasopharyngeal swab specimens and should not be used as a sole basis for treatment. Nasal washings and aspirates are unacceptable for Xpert Xpress SARS-CoV-2/FLU/RSV testing.  Fact Sheet for Patients: EntrepreneurPulse.com.au  Fact Sheet for Healthcare Providers: IncredibleEmployment.be  This test is not yet approved or cleared by the [place] FDA and has been authorized for detection and/or  diagnosis of SARS-CoV-2 by FDA under an Emergency Use Authorization (EUA). This EUA will remain in effect (meaning this test can be used) for the duration of the COVID-19 declaration under Section 564(b)(1) of the Act, 21 U.S.C. section 360bbb-3(b)(1), unless the authorization is terminated or revoked.  Performed at [facility], [place] [address], [place], [place] [address]   Blood culture (routine x 2)     Status: None (Preliminary result)   Collection Time: 08/11/21  7:43 PM   Specimen: BLOOD  Result Value Ref Range Status   Specimen Description   Final    BLOOD LEFT ANTECUBITAL Performed at [facility] [address], [place], [place] [address]    Special Requests   Final    BOTTLES DRAWN AEROBIC AND ANAEROBIC Blood Culture results may not be optimal due to an excessive volume of blood received in culture bottles Performed at [facility] [address], [place], [place] [address]  Culture   Final    NO GROWTH 3 DAYS Performed at [facility], [place] [address], [place], [place] [address]    Report Status PENDING  Incomplete  Blood culture (routine x 2)     Status: None (Preliminary result)   Collection Time: 08/11/21  7:43 PM   Specimen: BLOOD  Result Value Ref Range Status   Specimen Description   Final    BLOOD RIGHT WRIST Performed at [facility] [address], [place], [place] [address]    Special Requests   Final    BOTTLES DRAWN AEROBIC AND ANAEROBIC Blood Culture results may not be optimal due to an inadequate volume of blood received in culture bottles Performed at [facility] [address], [place], [place] [address]    Culture   Final    NO GROWTH 3 DAYS Performed at [facility], [place] [address], [place], [place] [address]    Report Status PENDING  Incomplete  MRSA Next Gen by PCR, Nasal     Status: None   Collection Time: 08/11/21 11:24 PM    Specimen: Nasal Mucosa; Nasal Swab  Result Value Ref Range Status   MRSA by PCR Next Gen NOT DETECTED NOT DETECTED Final    Comment: (NOTE) The GeneXpert MRSA Assay (FDA approved for NASAL specimens only), is one component of a comprehensive MRSA colonization surveillance program. It is not intended to diagnose MRSA infection nor to guide or monitor treatment for MRSA infections. Test performance is not FDA approved in patients less than 43 years old. Performed at [facility], [place] [address] [place], [place] [address]     Please note: You were cared for by a hospitalist during your hospital stay. Once you are discharged, your primary care physician will handle any further medical issues. Please note that NO REFILLS for any discharge medications will be authorized once you are discharged, as it is imperative that you return to your primary care physician (or establish a relationship with a primary care physician if you do not have one) for your post hospital discharge needs so that they can reassess your need for medications and monitor your lab values.    Time coordinating discharge: 40 minutes  SIGNED:   Shelly Coss, MD  [facility] 08/15/2021, 12:15 PM Pager [phone]  If 7PM-7AM, please contact night-coverage [email or web address] Password [email or web address]

## 2021-08-15 NOTE — TOC Transition Note (Signed)
Transition of Care [facility]) - CM/SW Discharge Note   Patient Details  Name: Barry Mccoy MRN: [id number] Date of Birth: 03-01-1951  Transition of Care [facility] CM/SW Contact:  Dylan Ruotolo, Marjie Skiff, RN Phone Number: 08/15/2021, 2:11 PM   Clinical Narrative:    Spoke with pt at bedside to discuss follow up with a PCP per MD request. Pt has [facility] and will need to choose a PCP provider within his insurance network. Multiple PCP offices in [place] placed on AVS for pt to choose from and call for follow up at dc. Pt asked if he would like substance abuse resources which he politely declined. Pt was cleared by Psych. He states he will drive his car back to the motel where he was staying until his apartment is ready the day after tomorrow. [employer] called to inform of dc. Left voicemail for Sharyn Lull [phone] ext. [phone]).   Final next level of care: [place] Barriers to Discharge: Continued Medical Work up   Patient Goals and CMS Choice Patient states their goals for this hospitalization and ongoing recovery are:: Home with [facility] CMS Medicare.gov Compare Post Acute Care list provided to:: Patient Choice offered to / list presented to : Patient     Discharge Plan and Services In-house Referral: Clinical Social Work Discharge Planning Services: CM Consult Post Acute Care Choice: Home Health                  Readmission Risk Interventions No flowsheet data found.

## 2021-08-16 ENCOUNTER — Telehealth: Payer: Self-pay | Admitting: Hematology and Oncology

## 2021-08-16 LAB — HEMOGLOBIN A1C
Hgb A1c MFr Bld: >15.5 % — ABNORMAL HIGH (ref 4.8–5.6)
Mean Plasma Glucose: >398 mg/dL

## 2021-08-16 LAB — SURGICAL PATHOLOGY

## 2021-08-16 NOTE — Telephone Encounter (Signed)
Scheduled appt per 12/6 staff msg from Dr. Lorenso Courier. Pt is aware of appt date and time.

## 2021-08-17 LAB — CULTURE, BLOOD (ROUTINE X 2)
Culture: NO GROWTH
Culture: NO GROWTH

## 2021-09-06 ENCOUNTER — Other Ambulatory Visit: Payer: Self-pay | Admitting: Oncology

## 2021-09-06 ENCOUNTER — Inpatient Hospital Stay: Payer: Medicare HMO | Attending: Hematology and Oncology

## 2021-09-06 ENCOUNTER — Other Ambulatory Visit: Payer: Self-pay

## 2021-09-06 ENCOUNTER — Inpatient Hospital Stay (HOSPITAL_BASED_OUTPATIENT_CLINIC_OR_DEPARTMENT_OTHER): Payer: Medicare HMO | Admitting: Hematology and Oncology

## 2021-09-06 VITALS — BP 113/53 | HR 63 | Temp 97.9°F | Resp 20 | Wt 198.5 lb

## 2021-09-06 DIAGNOSIS — N529 Male erectile dysfunction, unspecified: Secondary | ICD-10-CM | POA: Diagnosis not present

## 2021-09-06 DIAGNOSIS — Z800 Family history of malignant neoplasm of digestive organs: Secondary | ICD-10-CM | POA: Insufficient documentation

## 2021-09-06 DIAGNOSIS — D472 Monoclonal gammopathy: Secondary | ICD-10-CM

## 2021-09-06 DIAGNOSIS — C9110 Chronic lymphocytic leukemia of B-cell type not having achieved remission: Secondary | ICD-10-CM | POA: Diagnosis not present

## 2021-09-06 DIAGNOSIS — F17210 Nicotine dependence, cigarettes, uncomplicated: Secondary | ICD-10-CM

## 2021-09-06 DIAGNOSIS — Z8042 Family history of malignant neoplasm of prostate: Secondary | ICD-10-CM | POA: Diagnosis not present

## 2021-09-06 LAB — CMP (CANCER CENTER ONLY)
ALT: 13 U/L (ref 0–44)
AST: 14 U/L — ABNORMAL LOW (ref 15–41)
Albumin: 4 g/dL (ref 3.5–5.0)
Alkaline Phosphatase: 56 U/L (ref 38–126)
Anion gap: 7 (ref 5–15)
BUN: 14 mg/dL (ref 8–23)
CO2: 22 mmol/L (ref 22–32)
Calcium: 9.1 mg/dL (ref 8.9–10.3)
Chloride: 105 mmol/L (ref 98–111)
Creatinine: 0.65 mg/dL (ref 0.61–1.24)
GFR, Estimated: >60 mL/min (ref >60)
Glucose, Bld: 310 mg/dL — ABNORMAL HIGH (ref 70–99)
Potassium: 3.6 mmol/L (ref 3.5–5.1)
Sodium: 134 mmol/L — ABNORMAL LOW (ref 135–145)
Total Bilirubin: 0.6 mg/dL (ref 0.3–1.2)
Total Protein: 7.3 g/dL (ref 6.5–8.1)

## 2021-09-06 LAB — CBC WITH DIFFERENTIAL (CANCER CENTER ONLY)
Abs Immature Granulocytes: 0.03 10*3/uL (ref 0.00–0.07)
Basophils Absolute: 0.1 10*3/uL (ref 0.0–0.1)
Basophils Relative: 1 %
Eosinophils Absolute: 0.2 10*3/uL (ref 0.0–0.5)
Eosinophils Relative: 1 %
HCT: 44.8 % (ref 39.0–52.0)
Hemoglobin: 14.8 g/dL (ref 13.0–17.0)
Immature Granulocytes: 0 %
Lymphocytes Relative: 63 %
Lymphs Abs: 9.3 10*3/uL — ABNORMAL HIGH (ref 0.7–4.0)
MCH: 31.3 pg (ref 26.0–34.0)
MCHC: 33 g/dL (ref 30.0–36.0)
MCV: 94.7 fL (ref 80.0–100.0)
Monocytes Absolute: 0.8 10*3/uL (ref 0.1–1.0)
Monocytes Relative: 6 %
Neutro Abs: 4.2 10*3/uL (ref 1.7–7.7)
Neutrophils Relative %: 29 %
Platelet Count: 309 10*3/uL (ref 150–400)
RBC: 4.73 MIL/uL (ref 4.22–5.81)
RDW: 14.4 % (ref 11.5–15.5)
WBC Count: 14.6 10*3/uL — ABNORMAL HIGH (ref 4.0–10.5)
nRBC: 0 % (ref 0.0–0.2)

## 2021-09-06 LAB — LACTATE DEHYDROGENASE: LDH: 118 U/L (ref 98–192)

## 2021-09-06 NOTE — Progress Notes (Signed)
[facility] Telephone:([phone]) [phone]   Fax:(336) [place] NOTE  Patient Care Team: System, Provider Not In as PCP - General Martinique, Peter M, MD as PCP - Cardiology (Cardiology) Rennis Golden (Physician Assistant)  Hematological/Oncological History #Chronic Lymphocytic Leukemia Rai Stage II # Lymphocytosis 10/02/2016: WBC 18.4, Hgb 6.3, MCV 79.8, Plt 277 (neutrophilic predominant, GI bleeding) 04/26/2021: WBC 26.4, Hgb 14.4, MCV 93.1, Plt 306 12/6/20222: WBC 22.8, Hgb 14.0, MCV 92.4, Plt 301 09/06/2021 ;establish care with Dr. Lorenso Courier   CHIEF COMPLAINTS/PURPOSE OF CONSULTATION:  "Lymphocytosis "  HISTORY OF PRESENTING ILLNESS:  Barry Mccoy 70 y.o. male with medical history significant for 9 prostatic hypertrophy, chronic kidney disease, depression, alcohol abuse, and prior GI bleed who presents for evaluation of lymphocytosis.  On review of the previous records Barry Mccoy last had a normal lymphocyte count on 10/17/2015 at which time he had white blood cell count 9.5, hemoglobin 16.7, platelets of 213, and ALC of 4100.  He was first noted to have an elevation on 10/02/2016 with a white blood cell count 18.4, however that was neutrophilic predominant.  He was first documented to have a true lymphocytosis on 04/25/2021 at which time his white blood cell count was 23.2, with lymphocytes of 15,700.  Due to concern for these findings the patient was referred to hematology for further evaluation and management.  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  On exam today Barry Mccoy reports that he had his spleen removed in 2019 due to having "funny looking cells".  He notes that he does have some sleeping difficulties but overall his health has been stable.  He denies any recent issues with fevers, chills, sweats, nausea, vomiting or diarrhea.  He is  interesting in obtaining a Viagra prescription for new years.  On further discussion he notes that his family history is remarkable for colon cancer in his mother, and a stroke in his father.  His brother had prostate cancer and agent orange exposure.  He is currently an active smoker smoking 1/2 pack/day.  He reports he has not had any alcohol in 1 years time.  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  He currently denies any fevers, chills, sweats, nausea, vomiting or diarrhea.  A full 10 point ROS is listed below.  MEDICAL HISTORY:  Past Medical History:  Diagnosis Date   Anxiety    BPH (benign prostatic hyperplasia)    Cervical spine disease    Chronic kidney disease    kidney stones   Depression    Disorder of lumbar spine    ETOH abuse    GI bleed    Muscle spasm     SURGICAL HISTORY: Past Surgical History:  Procedure Laterality Date   COLONOSCOPY WITH PROPOFOL N/A 11/08/2016   Procedure: COLONOSCOPY WITH PROPOFOL;  Surgeon: Wilford Corner, MD;  Location: [facility];  Service: Endoscopy;  Laterality: N/A;   CORONARY STENT INTERVENTION N/A 04/25/2021   Procedure: CORONARY STENT INTERVENTION;  Surgeon: Martinique, Peter M, MD;  Location: [facility];  Service: Cardiovascular;  Laterality: N/A;  RCA   ELBOW SURGERY     as child   ELBOW SURGERY     ENTEROSCOPY N/A 11/19/2016   Procedure: ENTEROSCOPY;  Surgeon: Wonda Horner, MD;  Location: [facility];  Service: Endoscopy;  Laterality: N/A;  ESOPHAGOGASTRODUODENOSCOPY N/A 10/04/2016   Procedure: ESOPHAGOGASTRODUODENOSCOPY (EGD);  Surgeon: Carol Ada, MD;  Location: [facility];  Service: Gastroenterology;  Laterality: N/A;   GIVENS CAPSULE STUDY N/A 11/09/2016   Procedure: GIVENS CAPSULE STUDY;  Surgeon: Wilford Corner, MD;  Location: [facility];  Service: Endoscopy;  Laterality: N/A;   LEFT HEART CATH AND CORONARY ANGIOGRAPHY N/A 04/25/2021   Procedure: LEFT HEART CATH AND CORONARY  ANGIOGRAPHY;  Surgeon: Martinique, Peter M, MD;  Location: [facility];  Service: Cardiovascular;  Laterality: N/A;    SOCIAL HISTORY: Social History   Socioeconomic History   Marital status: Widowed    Spouse name: Not on file   Number of children: 2   Years of education: Not on file   Highest education level: Some college, no degree  Occupational History   Not on file  Tobacco Use   Smoking status: Every Day    Packs/day: 1.00    Years: 50.00    Pack years: 50.00    Types: Cigarettes   Smokeless tobacco: Never   Tobacco comments:    attempting to quit- down to 8 a day   Vaping Use   Vaping Use: Never used  Substance and Sexual Activity   Alcohol use: No    Comment: 2 drinks a month   Drug use: Not Currently    Types: Marijuana   Sexual activity: Yes  Other Topics Concern   Not on file  Social History Narrative   ** Merged History Encounter **       Social Determinants of Health   Financial Resource Strain: High Risk   Difficulty of Paying Living Expenses: Very hard  Food Insecurity: Food Insecurity Present   Worried About Charity fundraiser in the Last Year: Sometimes true   Ran Out of Food in the Last Year: Never true  Transportation Needs: No Transportation Needs   Lack of Transportation (Medical): No   Lack of Transportation (Non-Medical): No  Physical Activity: Not on file  Stress: Not on file  Social Connections: Socially Isolated   Frequency of Communication with Friends and Family: Once a week   Frequency of Social Gatherings with Friends and Family: Never   Attends Religious Services: Never   Marine scientist or Organizations: No   Attends Archivist Meetings: Never   Marital Status: Widowed  Human resources officer Violence: Not on file    FAMILY HISTORY: Family History  Problem Relation Age of Onset   Colon cancer Brother    Alcohol abuse Brother    Cancer Brother    Early death Brother    Prostate cancer Brother    Colon  polyps Brother    Cancer Mother 89       colon cancer   Colon cancer Mother     ALLERGIES:  has No Known Allergies.  MEDICATIONS:  Current Outpatient Medications  Medication Sig Dispense Refill   sildenafil (VIAGRA) 50 MG tablet Take 1 tablet (50 mg total) by mouth daily as needed for erectile dysfunction. 10 tablet 0   thiamine 100 MG tablet Take 1 tablet (100 mg total) by mouth daily. For 4days then down to 100mg  daily (Patient taking differently: Take 100 mg by mouth 3 (three) times daily.)     vitamin B-12 1000 MCG tablet Take 1 tablet (1,000 mcg total) by mouth daily. 30 tablet 0   acetaminophen (TYLENOL) 325 MG tablet Take 2 tablets (650 mg total) by mouth every 6 (six) hours as needed for mild  pain, moderate pain or headache (or Fever >/= 101).     APIXABAN (ELIQUIS) VTE STARTER PACK (10MG  AND 5MG ) Take as directed on package: start with two-5mg  tablets twice daily for 7 days. On day 8, switch to one-5mg  tablet twice daily. (Patient taking differently: Take 5 mg by mouth 2 (two) times daily.) 1 each 0   atorvastatin (LIPITOR) 80 MG tablet Take 1 tablet (80 mg total) by mouth daily. (Patient not taking: Reported on 09/06/2021) 30 tablet 11   Brimonidine Tartrate (LUMIFY) 0.025 % SOLN Place 1 drop into both eyes daily as needed (redness).     Cholecalciferol (VITAMIN D-3) 5000 units TABS Take by mouth daily.     fenofibrate 160 MG tablet Take 1 tablet (160 mg total) by mouth daily. 30 tablet 6   folic acid (FOLVITE) 1 MG tablet Take 1 tablet (1 mg total) by mouth daily. (Patient not taking: Reported on 09/06/2021) 30 tablet 0   gabapentin (NEURONTIN) 300 MG capsule Take 2 capsules (600 mg total) by mouth 3 (three) times daily. 180 capsule 0   glipiZIDE (GLUCOTROL) 10 MG tablet Take 1 tablet (10 mg total) by mouth daily before breakfast. (Patient not taking: Reported on 09/06/2021) 30 tablet 1   iron polysaccharides (NIFEREX) 150 MG capsule Take 1 capsule (150 mg total) by mouth 2 (two)  times daily. (Patient not taking: Reported on 09/06/2021) 60 capsule 0   lidocaine (LIDODERM) 5 % Place 1 patch onto the skin every 12 (twelve) hours as needed (pain). (Patient not taking: Reported on 09/06/2021)  1   metFORMIN (GLUCOPHAGE-XR) 500 MG 24 hr tablet Take 1 tablet (500 mg total) by mouth daily with breakfast. (Patient not taking: Reported on 09/06/2021) 30 tablet 1   metoprolol succinate (TOPROL-XL) 25 MG 24 hr tablet Take 0.5 tablets (12.5 mg total) by mouth daily. 30 tablet 6   Multiple Vitamin (MULTIVITAMIN WITH MINERALS) TABS tablet Take 1 tablet by mouth at bedtime.     nitroGLYCERIN (NITROSTAT) 0.4 MG SL tablet Place 1 tablet (0.4 mg total) under the tongue every 5 (five) minutes x 3 doses as needed for chest pain. 25 tablet 12   oxybutynin (DITROPAN-XL) 5 MG 24 hr tablet Take 1 tablet by mouth daily. (Patient not taking: Reported on 09/06/2021)     pantoprazole (PROTONIX) 40 MG tablet Take 1 tablet (40 mg total) by mouth daily. (Patient not taking: Reported on 09/06/2021) 30 tablet 6   QUEtiapine (SEROQUEL) 50 MG tablet TAKE 1 TABLET (50 MG TOTAL) BY MOUTH AT BEDTIME. (Patient not taking: Reported on 09/06/2021) 30 tablet 0   traMADol (ULTRAM) 50 MG tablet Take 1 tablet (50 mg total) by mouth every 6 (six) hours as needed. (Patient not taking: Reported on 09/06/2021) 10 tablet 0   No current facility-administered medications for this visit.    REVIEW OF SYSTEMS:   Constitutional: ( - ) fevers, ( - )  chills , ( - ) night sweats Eyes: ( - ) blurriness of vision, ( - ) double vision, ( - ) watery eyes Ears, nose, mouth, throat, and face: ( - ) mucositis, ( - ) sore throat Respiratory: ( - ) cough, ( - ) dyspnea, ( - ) wheezes Cardiovascular: ( - ) palpitation, ( - ) chest discomfort, ( - ) lower extremity swelling Gastrointestinal:  ( - ) nausea, ( - ) heartburn, ( - ) change in bowel habits Skin: ( - ) abnormal skin rashes Lymphatics: ( - ) new lymphadenopathy, ( - )  easy  bruising Neurological: ( - ) numbness, ( - ) tingling, ( - ) new weaknesses Behavioral/Psych: ( - ) mood change, ( - ) new changes  All other systems were reviewed with the patient and are negative.  PHYSICAL EXAMINATION:  Vitals:   09/06/21 1408  BP: (!) 113/53  Pulse: 63  Resp: 20  Temp: 97.9 F (36.6 C)  SpO2: 97%   Filed Weights   09/06/21 1408  Weight: 198 lb 8 oz (90 kg)    GENERAL: well appearing elderly Caucasian male in NAD  SKIN: skin color, texture, turgor are normal, no rashes or significant lesions EYES: conjunctiva are pink and non-injected, sclera clear LUNGS: clear to auscultation and percussion with normal breathing effort HEART: regular rate & rhythm and no murmurs and no lower extremity edema Musculoskeletal: no cyanosis of digits and no clubbing  PSYCH: alert & oriented x 3, fluent speech NEURO: no focal motor/sensory deficits  LABORATORY DATA:  I have reviewed the data as listed CBC Latest Ref Rng & Units 09/06/2021 08/15/2021 08/14/2021  WBC 4.0 - 10.5 K/uL 14.6(H) 22.8(H) 21.8(H)  Hemoglobin 13.0 - 17.0 g/dL 14.8 14.0 15.1  Hematocrit 39.0 - 52.0 % 44.8 40.2 43.8  Platelets 150 - 400 K/uL 309 301 311    CMP Latest Ref Rng & Units 09/06/2021 08/14/2021 08/13/2021  Glucose 70 - 99 mg/dL 310(H) 239(H) 267(H)  BUN 8 - 23 mg/dL 14 8 9   Creatinine 0.61 - 1.24 mg/dL 0.65 0.63 0.58(L)  Sodium 135 - 145 mmol/L 134(L) 130(L) 127(L)  Potassium 3.5 - 5.1 mmol/L 3.6 4.2 3.8  Chloride 98 - 111 mmol/L 105 101 100  CO2 22 - 32 mmol/L 22 25 22   Calcium 8.9 - 10.3 mg/dL 9.1 8.6(L) 8.3(L)  Total Protein 6.5 - 8.1 g/dL 7.3 - -  Total Bilirubin 0.3 - 1.2 mg/dL 0.6 - -  Alkaline Phos 38 - 126 U/L 56 - -  AST 15 - 41 U/L 14(L) - -  ALT 0 - 44 U/L 13 - -   RADIOGRAPHIC STUDIES: DG Chest Portable 1 View  Result Date: 08/11/2021 CLINICAL DATA:  Fatigue, hyperglycemia EXAM: PORTABLE CHEST 1 VIEW COMPARISON:  06/24/2021 FINDINGS: Cardiac size is within normal limits.  Mediastinum is unremarkable. There are no signs of alveolar pulmonary edema or focal pulmonary consolidation. There is faint haziness with air soft tissue interface in the left lower lung fields. Lung markings are seen extending lateral to this interface. Costophrenic angles are clear. There is no definite pneumothorax. There are linear densities in both apices, possibly scarring. IMPRESSION: There are no signs of pulmonary edema or focal pulmonary consolidation. There is small faint radiopacity with air soft tissue interface in the its lateral margin. This may suggest an artifact such as skin fold or early infiltrate in the left lower lung fields. Short-term follow-up PA and lateral views of chest may be considered. Electronically Signed   By: Elmer Picker M.D.   On: 08/11/2021 16:45    ASSESSMENT & PLAN Barry Mccoy 70 y.o. male with medical history significant for 9 prostatic hypertrophy, chronic kidney disease, depression, alcohol abuse, and prior GI bleed who presents for evaluation of lymphocytosis.  After review of the labs, review of the records, and discussion with the patient the patients findings are most consistent with CLL.  The patient was previously followed at [facility] for this condition.  At this time I would recommend continued observation as there is no indication for treatment based on his other  blood counts.  The patient also has low risk cytogenetics with FISH deletion 13 and I GVH mutation.  The patient voices understanding of these findings and the plan moving forward.  # Lymphocytosis, Flow cytometry consistent with CLL -- Findings are most consistent with CLL.  He was previously followed at [facility] for this condition. --will order prognostic markers today.  --No clear indication for imaging at this time  -- Return to clinic in 6 months time to re-evaluate.   #MGUS --MGUS labs to be ordered at next visit --reported history per prior hematologist  #Erectile  Dysfunction --will call in Viagra 50 mg PO PRN.  --Cautioned the patient not to take it with the nitroglycerin he has been prescribed.  He voiced his understanding and notes that he has not been taking nitroglycerin.  Orders Placed This Encounter  Procedures   CBC with Differential ([facility] Only)    Standing Status:   Future    Number of Occurrences:   1    Standing Expiration Date:   09/06/2022   CMP ([facility] only)    Standing Status:   Future    Number of Occurrences:   1    Standing Expiration Date:   09/06/2022   Lactate dehydrogenase (LDH)    Standing Status:   Future    Number of Occurrences:   1    Standing Expiration Date:   09/06/2022   FISH, CLL Prognostic Panel    Standing Status:   Future    Number of Occurrences:   1    Standing Expiration Date:   09/06/2022   IgVH Somatic Hypermutation    Standing Status:   Future    Number of Occurrences:   1    Standing Expiration Date:   09/06/2022    All questions were answered. The patient knows to call the clinic with any problems, questions or concerns.  A total of more than 60 minutes were spent on this encounter with face-to-face time and non-face-to-face time, including preparing to see the patient, ordering tests and/or medications, counseling the patient and coordination of care as outlined above.   Ledell Peoples, MD Department of Hematology/Oncology [facility] [facility] Phone: [phone] Pager: [phone] Email: [email or web address]  09/07/2021 9:27 AM

## 2021-09-07 MED ORDER — SILDENAFIL CITRATE 50 MG PO TABS: 50.0000 mg | ORAL_TABLET | Freq: Every day | ORAL | 0 refills | Status: DC | PRN

## 2021-09-08 ENCOUNTER — Other Ambulatory Visit: Payer: Self-pay | Admitting: Physician Assistant

## 2021-09-08 MED ORDER — SILDENAFIL CITRATE 50 MG PO TABS: 50.0000 mg | ORAL_TABLET | Freq: Every day | ORAL | 0 refills | Status: AC | PRN

## 2021-09-12 LAB — IGVH SOMATIC HYPERMUTATION

## 2021-09-18 LAB — FISH,CLL PROGNOSTIC PANEL

## 2022-02-07 ENCOUNTER — Telehealth: Payer: Self-pay | Admitting: Hematology and Oncology

## 2022-02-07 NOTE — Telephone Encounter (Signed)
Called patient regarding upcoming June appointments, patient voicemail is full. Calender will be mailed.

## 2022-03-08 ENCOUNTER — Other Ambulatory Visit: Payer: Self-pay | Admitting: Hematology and Oncology

## 2022-03-08 ENCOUNTER — Inpatient Hospital Stay: Payer: Medicare PPO | Attending: Hematology and Oncology

## 2022-03-08 ENCOUNTER — Telehealth: Payer: Self-pay

## 2022-03-08 ENCOUNTER — Inpatient Hospital Stay: Payer: Medicare PPO | Admitting: Hematology and Oncology

## 2022-03-08 DIAGNOSIS — C9110 Chronic lymphocytic leukemia of B-cell type not having achieved remission: Secondary | ICD-10-CM

## 2022-03-08 NOTE — Telephone Encounter (Signed)
Pt no showed for lab and MD visit today. Called pt, he did not have transportation. Scheduling message sent to reschedule lab and MD appointment.

## 2022-03-29 ENCOUNTER — Inpatient Hospital Stay: Payer: Medicare PPO | Admitting: Hematology and Oncology

## 2022-03-29 ENCOUNTER — Inpatient Hospital Stay: Payer: Medicare PPO | Attending: Hematology and Oncology

## 2023-05-18 DIAGNOSIS — Z9081 Acquired absence of spleen: Secondary | ICD-10-CM | POA: Diagnosis not present

## 2023-05-18 DIAGNOSIS — F17210 Nicotine dependence, cigarettes, uncomplicated: Secondary | ICD-10-CM | POA: Diagnosis not present

## 2023-05-18 DIAGNOSIS — Z5941 Food insecurity: Secondary | ICD-10-CM | POA: Diagnosis not present

## 2023-05-18 DIAGNOSIS — E119 Type 2 diabetes mellitus without complications: Secondary | ICD-10-CM | POA: Diagnosis not present

## 2023-05-18 DIAGNOSIS — L905 Scar conditions and fibrosis of skin: Secondary | ICD-10-CM | POA: Diagnosis not present

## 2023-05-18 DIAGNOSIS — S39011A Strain of muscle, fascia and tendon of abdomen, initial encounter: Secondary | ICD-10-CM | POA: Diagnosis not present

## 2023-05-18 DIAGNOSIS — X58XXXA Exposure to other specified factors, initial encounter: Secondary | ICD-10-CM | POA: Diagnosis not present

## 2023-05-18 DIAGNOSIS — I491 Atrial premature depolarization: Secondary | ICD-10-CM | POA: Diagnosis not present

## 2023-05-18 DIAGNOSIS — Z5986 Financial insecurity: Secondary | ICD-10-CM | POA: Diagnosis not present

## 2023-05-18 DIAGNOSIS — R1010 Upper abdominal pain, unspecified: Secondary | ICD-10-CM | POA: Diagnosis not present

## 2023-05-18 DIAGNOSIS — R059 Cough, unspecified: Secondary | ICD-10-CM | POA: Diagnosis not present

## 2023-05-18 DIAGNOSIS — R1084 Generalized abdominal pain: Secondary | ICD-10-CM | POA: Diagnosis not present

## 2023-05-25 DIAGNOSIS — J439 Emphysema, unspecified: Secondary | ICD-10-CM | POA: Diagnosis not present

## 2023-05-25 DIAGNOSIS — E1165 Type 2 diabetes mellitus with hyperglycemia: Secondary | ICD-10-CM | POA: Diagnosis not present

## 2023-05-25 DIAGNOSIS — R2242 Localized swelling, mass and lump, left lower limb: Secondary | ICD-10-CM | POA: Diagnosis not present

## 2023-05-25 DIAGNOSIS — I82512 Chronic embolism and thrombosis of left femoral vein: Secondary | ICD-10-CM | POA: Diagnosis not present

## 2023-05-25 DIAGNOSIS — Z79899 Other long term (current) drug therapy: Secondary | ICD-10-CM | POA: Diagnosis not present

## 2023-05-25 DIAGNOSIS — Z7901 Long term (current) use of anticoagulants: Secondary | ICD-10-CM | POA: Diagnosis not present

## 2023-05-25 DIAGNOSIS — D72829 Elevated white blood cell count, unspecified: Secondary | ICD-10-CM | POA: Diagnosis not present

## 2023-05-25 DIAGNOSIS — J90 Pleural effusion, not elsewhere classified: Secondary | ICD-10-CM | POA: Diagnosis not present

## 2023-05-25 DIAGNOSIS — R0989 Other specified symptoms and signs involving the circulatory and respiratory systems: Secondary | ICD-10-CM | POA: Diagnosis not present

## 2023-05-25 DIAGNOSIS — R918 Other nonspecific abnormal finding of lung field: Secondary | ICD-10-CM | POA: Diagnosis not present

## 2023-05-25 DIAGNOSIS — J189 Pneumonia, unspecified organism: Secondary | ICD-10-CM | POA: Diagnosis not present

## 2023-05-25 DIAGNOSIS — I498 Other specified cardiac arrhythmias: Secondary | ICD-10-CM | POA: Diagnosis not present

## 2023-05-25 DIAGNOSIS — I509 Heart failure, unspecified: Secondary | ICD-10-CM | POA: Diagnosis not present

## 2023-05-25 DIAGNOSIS — S2232XA Fracture of one rib, left side, initial encounter for closed fracture: Secondary | ICD-10-CM | POA: Diagnosis not present

## 2023-05-25 DIAGNOSIS — M19072 Primary osteoarthritis, left ankle and foot: Secondary | ICD-10-CM | POA: Diagnosis not present

## 2023-05-25 DIAGNOSIS — R791 Abnormal coagulation profile: Secondary | ICD-10-CM | POA: Diagnosis not present

## 2023-05-25 DIAGNOSIS — E119 Type 2 diabetes mellitus without complications: Secondary | ICD-10-CM | POA: Diagnosis not present

## 2023-05-25 DIAGNOSIS — M7989 Other specified soft tissue disorders: Secondary | ICD-10-CM | POA: Diagnosis not present

## 2023-05-25 DIAGNOSIS — R0602 Shortness of breath: Secondary | ICD-10-CM | POA: Diagnosis not present

## 2023-05-25 DIAGNOSIS — I480 Paroxysmal atrial fibrillation: Secondary | ICD-10-CM | POA: Diagnosis not present

## 2023-05-25 DIAGNOSIS — F17210 Nicotine dependence, cigarettes, uncomplicated: Secondary | ICD-10-CM | POA: Diagnosis not present

## 2023-05-25 DIAGNOSIS — I2510 Atherosclerotic heart disease of native coronary artery without angina pectoris: Secondary | ICD-10-CM | POA: Diagnosis not present

## 2023-05-25 DIAGNOSIS — I5043 Acute on chronic combined systolic (congestive) and diastolic (congestive) heart failure: Secondary | ICD-10-CM | POA: Diagnosis not present

## 2023-05-25 DIAGNOSIS — R7989 Other specified abnormal findings of blood chemistry: Secondary | ICD-10-CM | POA: Diagnosis not present

## 2023-05-26 DIAGNOSIS — E1165 Type 2 diabetes mellitus with hyperglycemia: Secondary | ICD-10-CM | POA: Diagnosis not present

## 2023-05-26 DIAGNOSIS — M79604 Pain in right leg: Secondary | ICD-10-CM | POA: Diagnosis not present

## 2023-05-26 DIAGNOSIS — E119 Type 2 diabetes mellitus without complications: Secondary | ICD-10-CM | POA: Diagnosis not present

## 2023-05-26 DIAGNOSIS — I82512 Chronic embolism and thrombosis of left femoral vein: Secondary | ICD-10-CM | POA: Diagnosis not present

## 2023-05-26 DIAGNOSIS — I5043 Acute on chronic combined systolic (congestive) and diastolic (congestive) heart failure: Secondary | ICD-10-CM | POA: Diagnosis not present

## 2023-05-26 DIAGNOSIS — R2242 Localized swelling, mass and lump, left lower limb: Secondary | ICD-10-CM | POA: Diagnosis not present

## 2023-05-26 DIAGNOSIS — E785 Hyperlipidemia, unspecified: Secondary | ICD-10-CM | POA: Diagnosis not present

## 2023-05-26 DIAGNOSIS — M79605 Pain in left leg: Secondary | ICD-10-CM | POA: Diagnosis not present

## 2023-05-26 DIAGNOSIS — R7989 Other specified abnormal findings of blood chemistry: Secondary | ICD-10-CM | POA: Diagnosis not present

## 2023-05-26 DIAGNOSIS — I498 Other specified cardiac arrhythmias: Secondary | ICD-10-CM | POA: Diagnosis not present

## 2023-05-26 DIAGNOSIS — Z79899 Other long term (current) drug therapy: Secondary | ICD-10-CM | POA: Diagnosis not present

## 2023-05-26 DIAGNOSIS — I2510 Atherosclerotic heart disease of native coronary artery without angina pectoris: Secondary | ICD-10-CM | POA: Diagnosis not present

## 2023-05-26 DIAGNOSIS — I480 Paroxysmal atrial fibrillation: Secondary | ICD-10-CM | POA: Diagnosis not present

## 2023-05-26 DIAGNOSIS — F17210 Nicotine dependence, cigarettes, uncomplicated: Secondary | ICD-10-CM | POA: Diagnosis not present

## 2023-05-26 DIAGNOSIS — I82532 Chronic embolism and thrombosis of left popliteal vein: Secondary | ICD-10-CM | POA: Diagnosis not present

## 2023-05-26 DIAGNOSIS — J189 Pneumonia, unspecified organism: Secondary | ICD-10-CM | POA: Diagnosis not present

## 2023-05-26 DIAGNOSIS — I509 Heart failure, unspecified: Secondary | ICD-10-CM | POA: Diagnosis not present

## 2023-05-27 DIAGNOSIS — I5043 Acute on chronic combined systolic (congestive) and diastolic (congestive) heart failure: Secondary | ICD-10-CM | POA: Diagnosis not present

## 2023-05-27 DIAGNOSIS — F17210 Nicotine dependence, cigarettes, uncomplicated: Secondary | ICD-10-CM | POA: Diagnosis not present

## 2023-05-27 DIAGNOSIS — R2242 Localized swelling, mass and lump, left lower limb: Secondary | ICD-10-CM | POA: Diagnosis not present

## 2023-05-27 DIAGNOSIS — I5023 Acute on chronic systolic (congestive) heart failure: Secondary | ICD-10-CM | POA: Diagnosis not present

## 2023-05-27 DIAGNOSIS — I480 Paroxysmal atrial fibrillation: Secondary | ICD-10-CM | POA: Diagnosis not present

## 2023-05-27 DIAGNOSIS — Z79899 Other long term (current) drug therapy: Secondary | ICD-10-CM | POA: Diagnosis not present

## 2023-05-27 DIAGNOSIS — I82512 Chronic embolism and thrombosis of left femoral vein: Secondary | ICD-10-CM | POA: Diagnosis not present

## 2023-05-27 DIAGNOSIS — R7989 Other specified abnormal findings of blood chemistry: Secondary | ICD-10-CM | POA: Diagnosis not present

## 2023-05-27 DIAGNOSIS — J189 Pneumonia, unspecified organism: Secondary | ICD-10-CM | POA: Diagnosis not present

## 2023-05-27 DIAGNOSIS — I509 Heart failure, unspecified: Secondary | ICD-10-CM | POA: Diagnosis not present

## 2023-05-27 DIAGNOSIS — Z720 Tobacco use: Secondary | ICD-10-CM | POA: Diagnosis not present

## 2023-05-27 DIAGNOSIS — E119 Type 2 diabetes mellitus without complications: Secondary | ICD-10-CM | POA: Diagnosis not present

## 2023-05-27 DIAGNOSIS — I340 Nonrheumatic mitral (valve) insufficiency: Secondary | ICD-10-CM | POA: Diagnosis not present

## 2023-05-27 DIAGNOSIS — E1165 Type 2 diabetes mellitus with hyperglycemia: Secondary | ICD-10-CM | POA: Diagnosis not present

## 2023-05-27 DIAGNOSIS — E785 Hyperlipidemia, unspecified: Secondary | ICD-10-CM | POA: Diagnosis not present

## 2023-05-27 DIAGNOSIS — I2510 Atherosclerotic heart disease of native coronary artery without angina pectoris: Secondary | ICD-10-CM | POA: Diagnosis not present

## 2023-05-27 DIAGNOSIS — I498 Other specified cardiac arrhythmias: Secondary | ICD-10-CM | POA: Diagnosis not present

## 2023-05-28 DIAGNOSIS — I5043 Acute on chronic combined systolic (congestive) and diastolic (congestive) heart failure: Secondary | ICD-10-CM | POA: Diagnosis not present

## 2023-05-28 DIAGNOSIS — M7989 Other specified soft tissue disorders: Secondary | ICD-10-CM | POA: Diagnosis not present

## 2023-05-28 DIAGNOSIS — R918 Other nonspecific abnormal finding of lung field: Secondary | ICD-10-CM | POA: Diagnosis not present

## 2023-05-28 DIAGNOSIS — J90 Pleural effusion, not elsewhere classified: Secondary | ICD-10-CM | POA: Diagnosis not present

## 2023-05-28 DIAGNOSIS — I2510 Atherosclerotic heart disease of native coronary artery without angina pectoris: Secondary | ICD-10-CM | POA: Diagnosis not present

## 2023-05-28 DIAGNOSIS — E1165 Type 2 diabetes mellitus with hyperglycemia: Secondary | ICD-10-CM | POA: Diagnosis not present

## 2023-05-28 DIAGNOSIS — R7989 Other specified abnormal findings of blood chemistry: Secondary | ICD-10-CM | POA: Diagnosis not present

## 2023-05-28 DIAGNOSIS — I5030 Unspecified diastolic (congestive) heart failure: Secondary | ICD-10-CM | POA: Diagnosis not present

## 2023-05-28 DIAGNOSIS — J189 Pneumonia, unspecified organism: Secondary | ICD-10-CM | POA: Diagnosis not present

## 2023-05-28 DIAGNOSIS — F17210 Nicotine dependence, cigarettes, uncomplicated: Secondary | ICD-10-CM | POA: Diagnosis not present

## 2023-05-28 DIAGNOSIS — I5023 Acute on chronic systolic (congestive) heart failure: Secondary | ICD-10-CM | POA: Diagnosis not present

## 2023-05-28 DIAGNOSIS — I480 Paroxysmal atrial fibrillation: Secondary | ICD-10-CM | POA: Diagnosis not present

## 2023-05-28 DIAGNOSIS — E119 Type 2 diabetes mellitus without complications: Secondary | ICD-10-CM | POA: Diagnosis not present

## 2023-05-28 DIAGNOSIS — I82512 Chronic embolism and thrombosis of left femoral vein: Secondary | ICD-10-CM | POA: Diagnosis not present

## 2023-05-28 DIAGNOSIS — R0602 Shortness of breath: Secondary | ICD-10-CM | POA: Diagnosis not present

## 2023-05-28 DIAGNOSIS — E785 Hyperlipidemia, unspecified: Secondary | ICD-10-CM | POA: Diagnosis not present

## 2023-05-28 DIAGNOSIS — I82412 Acute embolism and thrombosis of left femoral vein: Secondary | ICD-10-CM | POA: Diagnosis not present

## 2023-05-28 DIAGNOSIS — S2232XA Fracture of one rib, left side, initial encounter for closed fracture: Secondary | ICD-10-CM | POA: Diagnosis not present

## 2023-05-28 DIAGNOSIS — I498 Other specified cardiac arrhythmias: Secondary | ICD-10-CM | POA: Diagnosis not present

## 2023-06-26 DIAGNOSIS — Z792 Long term (current) use of antibiotics: Secondary | ICD-10-CM | POA: Diagnosis not present

## 2023-06-26 DIAGNOSIS — F17210 Nicotine dependence, cigarettes, uncomplicated: Secondary | ICD-10-CM | POA: Diagnosis not present

## 2023-06-26 DIAGNOSIS — F419 Anxiety disorder, unspecified: Secondary | ICD-10-CM | POA: Diagnosis not present

## 2023-06-26 DIAGNOSIS — E876 Hypokalemia: Secondary | ICD-10-CM | POA: Diagnosis not present

## 2023-06-26 DIAGNOSIS — I5042 Chronic combined systolic (congestive) and diastolic (congestive) heart failure: Secondary | ICD-10-CM | POA: Diagnosis not present

## 2023-06-26 DIAGNOSIS — C9110 Chronic lymphocytic leukemia of B-cell type not having achieved remission: Secondary | ICD-10-CM | POA: Diagnosis not present

## 2023-06-26 DIAGNOSIS — Z86718 Personal history of other venous thrombosis and embolism: Secondary | ICD-10-CM | POA: Diagnosis not present

## 2023-06-26 DIAGNOSIS — R918 Other nonspecific abnormal finding of lung field: Secondary | ICD-10-CM | POA: Diagnosis not present

## 2023-06-26 DIAGNOSIS — F1010 Alcohol abuse, uncomplicated: Secondary | ICD-10-CM | POA: Diagnosis not present

## 2023-06-26 DIAGNOSIS — I110 Hypertensive heart disease with heart failure: Secondary | ICD-10-CM | POA: Diagnosis not present

## 2023-06-26 DIAGNOSIS — R0902 Hypoxemia: Secondary | ICD-10-CM | POA: Diagnosis not present

## 2023-06-26 DIAGNOSIS — Z8679 Personal history of other diseases of the circulatory system: Secondary | ICD-10-CM | POA: Diagnosis not present

## 2023-06-26 DIAGNOSIS — E1140 Type 2 diabetes mellitus with diabetic neuropathy, unspecified: Secondary | ICD-10-CM | POA: Diagnosis not present

## 2023-06-26 DIAGNOSIS — I10 Essential (primary) hypertension: Secondary | ICD-10-CM | POA: Diagnosis not present

## 2023-06-26 DIAGNOSIS — Z7901 Long term (current) use of anticoagulants: Secondary | ICD-10-CM | POA: Diagnosis not present

## 2023-06-26 DIAGNOSIS — I2510 Atherosclerotic heart disease of native coronary artery without angina pectoris: Secondary | ICD-10-CM | POA: Diagnosis not present

## 2023-06-26 DIAGNOSIS — I480 Paroxysmal atrial fibrillation: Secondary | ICD-10-CM | POA: Diagnosis not present

## 2023-06-26 DIAGNOSIS — E119 Type 2 diabetes mellitus without complications: Secondary | ICD-10-CM | POA: Diagnosis not present

## 2023-06-26 DIAGNOSIS — I5023 Acute on chronic systolic (congestive) heart failure: Secondary | ICD-10-CM | POA: Diagnosis not present

## 2023-06-26 DIAGNOSIS — Z8659 Personal history of other mental and behavioral disorders: Secondary | ICD-10-CM | POA: Diagnosis not present

## 2023-06-26 DIAGNOSIS — J209 Acute bronchitis, unspecified: Secondary | ICD-10-CM | POA: Diagnosis not present

## 2023-06-26 DIAGNOSIS — J440 Chronic obstructive pulmonary disease with acute lower respiratory infection: Secondary | ICD-10-CM | POA: Diagnosis not present

## 2023-06-26 DIAGNOSIS — R079 Chest pain, unspecified: Secondary | ICD-10-CM | POA: Diagnosis not present

## 2023-06-26 DIAGNOSIS — J189 Pneumonia, unspecified organism: Secondary | ICD-10-CM | POA: Diagnosis not present

## 2023-06-26 DIAGNOSIS — F32A Depression, unspecified: Secondary | ICD-10-CM | POA: Diagnosis not present

## 2023-06-26 DIAGNOSIS — R059 Cough, unspecified: Secondary | ICD-10-CM | POA: Diagnosis not present

## 2023-06-26 DIAGNOSIS — J449 Chronic obstructive pulmonary disease, unspecified: Secondary | ICD-10-CM | POA: Diagnosis not present

## 2023-06-27 DIAGNOSIS — Z8679 Personal history of other diseases of the circulatory system: Secondary | ICD-10-CM | POA: Diagnosis not present

## 2023-06-27 DIAGNOSIS — Z86718 Personal history of other venous thrombosis and embolism: Secondary | ICD-10-CM | POA: Diagnosis not present

## 2023-06-27 DIAGNOSIS — J189 Pneumonia, unspecified organism: Secondary | ICD-10-CM | POA: Diagnosis not present

## 2023-06-27 DIAGNOSIS — E876 Hypokalemia: Secondary | ICD-10-CM | POA: Diagnosis not present

## 2023-06-28 ENCOUNTER — Telehealth: Payer: Self-pay | Admitting: Hematology and Oncology

## 2023-07-03 DIAGNOSIS — C9110 Chronic lymphocytic leukemia of B-cell type not having achieved remission: Secondary | ICD-10-CM | POA: Diagnosis not present

## 2023-07-03 DIAGNOSIS — D472 Monoclonal gammopathy: Secondary | ICD-10-CM | POA: Diagnosis not present

## 2023-07-03 DIAGNOSIS — Z9081 Acquired absence of spleen: Secondary | ICD-10-CM | POA: Diagnosis not present

## 2023-07-03 DIAGNOSIS — Z8042 Family history of malignant neoplasm of prostate: Secondary | ICD-10-CM | POA: Diagnosis not present

## 2023-07-03 DIAGNOSIS — Z800 Family history of malignant neoplasm of digestive organs: Secondary | ICD-10-CM | POA: Diagnosis not present

## 2023-07-03 DIAGNOSIS — I2510 Atherosclerotic heart disease of native coronary artery without angina pectoris: Secondary | ICD-10-CM | POA: Diagnosis not present

## 2023-07-03 DIAGNOSIS — E1140 Type 2 diabetes mellitus with diabetic neuropathy, unspecified: Secondary | ICD-10-CM | POA: Diagnosis not present

## 2023-07-03 DIAGNOSIS — I480 Paroxysmal atrial fibrillation: Secondary | ICD-10-CM | POA: Diagnosis not present

## 2023-07-03 DIAGNOSIS — I509 Heart failure, unspecified: Secondary | ICD-10-CM | POA: Diagnosis not present

## 2023-07-09 ENCOUNTER — Other Ambulatory Visit: Payer: Medicare PPO

## 2023-07-09 ENCOUNTER — Ambulatory Visit: Payer: Medicare PPO | Admitting: Hematology and Oncology

## 2023-07-09 DIAGNOSIS — R002 Palpitations: Secondary | ICD-10-CM | POA: Diagnosis not present

## 2023-07-09 DIAGNOSIS — Z720 Tobacco use: Secondary | ICD-10-CM | POA: Diagnosis not present

## 2023-07-09 DIAGNOSIS — R457 State of emotional shock and stress, unspecified: Secondary | ICD-10-CM | POA: Diagnosis not present

## 2023-07-09 DIAGNOSIS — I2510 Atherosclerotic heart disease of native coronary artery without angina pectoris: Secondary | ICD-10-CM | POA: Diagnosis not present

## 2023-07-09 DIAGNOSIS — R079 Chest pain, unspecified: Secondary | ICD-10-CM | POA: Diagnosis not present

## 2023-07-09 DIAGNOSIS — F1290 Cannabis use, unspecified, uncomplicated: Secondary | ICD-10-CM | POA: Diagnosis not present

## 2023-07-09 DIAGNOSIS — F410 Panic disorder [episodic paroxysmal anxiety] without agoraphobia: Secondary | ICD-10-CM | POA: Diagnosis not present

## 2023-07-09 DIAGNOSIS — F17210 Nicotine dependence, cigarettes, uncomplicated: Secondary | ICD-10-CM | POA: Diagnosis not present

## 2023-07-09 DIAGNOSIS — I509 Heart failure, unspecified: Secondary | ICD-10-CM | POA: Diagnosis not present

## 2023-07-09 DIAGNOSIS — I959 Hypotension, unspecified: Secondary | ICD-10-CM | POA: Diagnosis not present

## 2023-07-09 DIAGNOSIS — J441 Chronic obstructive pulmonary disease with (acute) exacerbation: Secondary | ICD-10-CM | POA: Diagnosis not present

## 2023-07-09 DIAGNOSIS — E119 Type 2 diabetes mellitus without complications: Secondary | ICD-10-CM | POA: Diagnosis not present

## 2023-07-09 DIAGNOSIS — R0689 Other abnormalities of breathing: Secondary | ICD-10-CM | POA: Diagnosis not present

## 2023-07-09 DIAGNOSIS — I517 Cardiomegaly: Secondary | ICD-10-CM | POA: Diagnosis not present

## 2023-07-09 DIAGNOSIS — R0789 Other chest pain: Secondary | ICD-10-CM | POA: Diagnosis not present

## 2023-07-09 DIAGNOSIS — Z955 Presence of coronary angioplasty implant and graft: Secondary | ICD-10-CM | POA: Diagnosis not present

## 2023-07-09 DIAGNOSIS — I700 Atherosclerosis of aorta: Secondary | ICD-10-CM | POA: Diagnosis not present

## 2023-07-31 DIAGNOSIS — F1290 Cannabis use, unspecified, uncomplicated: Secondary | ICD-10-CM | POA: Diagnosis not present

## 2023-07-31 DIAGNOSIS — Z23 Encounter for immunization: Secondary | ICD-10-CM | POA: Diagnosis not present

## 2023-07-31 DIAGNOSIS — F410 Panic disorder [episodic paroxysmal anxiety] without agoraphobia: Secondary | ICD-10-CM | POA: Diagnosis not present

## 2023-07-31 DIAGNOSIS — I5023 Acute on chronic systolic (congestive) heart failure: Secondary | ICD-10-CM | POA: Diagnosis not present

## 2023-07-31 DIAGNOSIS — E1165 Type 2 diabetes mellitus with hyperglycemia: Secondary | ICD-10-CM | POA: Diagnosis not present

## 2023-07-31 DIAGNOSIS — F411 Generalized anxiety disorder: Secondary | ICD-10-CM | POA: Diagnosis not present

## 2023-07-31 DIAGNOSIS — I252 Old myocardial infarction: Secondary | ICD-10-CM | POA: Diagnosis not present

## 2023-07-31 DIAGNOSIS — I480 Paroxysmal atrial fibrillation: Secondary | ICD-10-CM | POA: Diagnosis not present

## 2023-07-31 DIAGNOSIS — F330 Major depressive disorder, recurrent, mild: Secondary | ICD-10-CM | POA: Diagnosis not present

## 2023-08-02 ENCOUNTER — Ambulatory Visit: Payer: Medicare PPO | Admitting: Nurse Practitioner

## 2023-08-02 NOTE — Progress Notes (Deleted)
  Cardiology Office Note:  .   Date:  08/02/2023  ID:  Barry Mccoy, DOB Jun 14, 1951, MRN [id number] PCP: System, Provider Not In  [facility] Providers Cardiologist:  Barry [place], MD { Click to update primary MD,subspecialty MD or APP then REFRESH:1}   History of Present Illness: [place] Kitchen   Barry Mccoy is a 72 y.o. male with a PMH of CAD, s/p inferior STEMI in 2022, history of cardiomyopathy, PAF, type 2 diabetes, history of tobacco abuse, pancreatitis with pseudocyst, splenic abscess, s/p splenectomy, chronic lymphocytic leukemia, COPD, and history of DVT, who presents today for overdue follow-up.   Previously followed by [facility] [place]. Diagnosed with inferior STEMI in 2022, received successful PCI at that time with drug-eluting stent to the RCA.  He developed V-fib after reperfusion had successful cardioversion, was placed on amiodarone at the time.  Echocardiogram in 2022 revealed septal and inferior wall hypokinesis with a EF 35 to 40%, mildly reduced RV function.  GDMT was limited at the time due to low BPs.  Last seen by the heart failure clinic on May 03, 2021, and he said he was feeling better since leaving the hospital.  Denied any further chest pain or shortness of breath at the time.  Since last office visit, he has had several ED visits/hospital admissions at [facility]. Per review of records, was diagnosed with DVT of left deep femoral vein earlier this year, felt to be presumably chronic in etiology. Was tx with Lovenox, was stated would treat with Eliquis in outpatient setting.   ED visit at [facility] on July 09, 2023  Today he presents for overdue follow-up. He states...   ROS:   Studies Reviewed: .        *** Risk Assessment/Calculations:   {Does this patient have ATRIAL FIBRILLATION?:[phone]} No BP recorded.  {Refresh Note OR Click here to enter BP  :1}***       Physical Exam:   VS:  There were no vitals taken for this visit.   Wt  Readings from Last 3 Encounters:  09/06/21 198 lb 8 oz (90 kg)  08/11/21 209 lb 7 oz (95 kg)  06/24/21 210 lb (95.3 kg)    GEN: Well nourished, well developed in no acute distress NECK: No JVD; No carotid bruits CARDIAC: ***RRR, no murmurs, rubs, gallops RESPIRATORY:  Clear to auscultation without rales, wheezing or rhonchi  ABDOMEN: Soft, non-tender, non-distended EXTREMITIES:  No edema; No deformity   ASSESSMENT AND PLAN: .   ***    {Are you ordering a CV Procedure (e.g. stress test, cath, DCCV, TEE, etc)?   Press F2        :[id number]}  Dispo: ***  Signed, Sharlene Dory, NP

## 2023-08-07 DIAGNOSIS — Z9081 Acquired absence of spleen: Secondary | ICD-10-CM | POA: Diagnosis not present

## 2023-08-07 DIAGNOSIS — C9110 Chronic lymphocytic leukemia of B-cell type not having achieved remission: Secondary | ICD-10-CM | POA: Diagnosis not present

## 2023-08-28 DIAGNOSIS — M7989 Other specified soft tissue disorders: Secondary | ICD-10-CM | POA: Diagnosis not present

## 2023-08-28 DIAGNOSIS — R0602 Shortness of breath: Secondary | ICD-10-CM | POA: Diagnosis not present

## 2023-08-28 DIAGNOSIS — I509 Heart failure, unspecified: Secondary | ICD-10-CM | POA: Diagnosis not present

## 2023-08-28 DIAGNOSIS — I700 Atherosclerosis of aorta: Secondary | ICD-10-CM | POA: Diagnosis not present

## 2023-08-28 DIAGNOSIS — R609 Edema, unspecified: Secondary | ICD-10-CM | POA: Diagnosis not present

## 2023-08-28 DIAGNOSIS — R0600 Dyspnea, unspecified: Secondary | ICD-10-CM | POA: Diagnosis not present

## 2023-08-28 DIAGNOSIS — I517 Cardiomegaly: Secondary | ICD-10-CM | POA: Diagnosis not present

## 2023-08-28 DIAGNOSIS — J811 Chronic pulmonary edema: Secondary | ICD-10-CM | POA: Diagnosis not present

## 2023-08-28 DIAGNOSIS — J439 Emphysema, unspecified: Secondary | ICD-10-CM | POA: Diagnosis not present

## 2023-08-28 DIAGNOSIS — R000 Tachycardia, unspecified: Secondary | ICD-10-CM | POA: Diagnosis not present

## 2023-08-28 DIAGNOSIS — F1290 Cannabis use, unspecified, uncomplicated: Secondary | ICD-10-CM | POA: Diagnosis not present

## 2023-08-28 DIAGNOSIS — M79606 Pain in leg, unspecified: Secondary | ICD-10-CM | POA: Diagnosis not present

## 2023-08-28 DIAGNOSIS — J441 Chronic obstructive pulmonary disease with (acute) exacerbation: Secondary | ICD-10-CM | POA: Diagnosis not present

## 2023-08-28 DIAGNOSIS — F17210 Nicotine dependence, cigarettes, uncomplicated: Secondary | ICD-10-CM | POA: Diagnosis not present

## 2023-08-28 DIAGNOSIS — I82411 Acute embolism and thrombosis of right femoral vein: Secondary | ICD-10-CM | POA: Diagnosis not present

## 2023-08-28 DIAGNOSIS — R0689 Other abnormalities of breathing: Secondary | ICD-10-CM | POA: Diagnosis not present

## 2023-08-28 DIAGNOSIS — E119 Type 2 diabetes mellitus without complications: Secondary | ICD-10-CM | POA: Diagnosis not present

## 2023-08-28 DIAGNOSIS — R918 Other nonspecific abnormal finding of lung field: Secondary | ICD-10-CM | POA: Diagnosis not present

## 2023-08-28 DIAGNOSIS — Z20822 Contact with and (suspected) exposure to covid-19: Secondary | ICD-10-CM | POA: Diagnosis not present

## 2023-08-29 DIAGNOSIS — I82402 Acute embolism and thrombosis of unspecified deep veins of left lower extremity: Secondary | ICD-10-CM | POA: Diagnosis not present

## 2023-09-19 DIAGNOSIS — Z79899 Other long term (current) drug therapy: Secondary | ICD-10-CM | POA: Diagnosis not present

## 2023-09-19 DIAGNOSIS — F17210 Nicotine dependence, cigarettes, uncomplicated: Secondary | ICD-10-CM | POA: Diagnosis not present

## 2023-09-19 DIAGNOSIS — I959 Hypotension, unspecified: Secondary | ICD-10-CM | POA: Diagnosis not present

## 2023-09-19 DIAGNOSIS — R609 Edema, unspecified: Secondary | ICD-10-CM | POA: Diagnosis not present

## 2023-09-19 DIAGNOSIS — Z91013 Allergy to seafood: Secondary | ICD-10-CM | POA: Diagnosis not present

## 2023-09-19 DIAGNOSIS — R0902 Hypoxemia: Secondary | ICD-10-CM | POA: Diagnosis not present

## 2023-09-19 DIAGNOSIS — I82401 Acute embolism and thrombosis of unspecified deep veins of right lower extremity: Secondary | ICD-10-CM | POA: Diagnosis not present

## 2023-09-19 DIAGNOSIS — L03116 Cellulitis of left lower limb: Secondary | ICD-10-CM | POA: Diagnosis not present

## 2023-09-19 DIAGNOSIS — E119 Type 2 diabetes mellitus without complications: Secondary | ICD-10-CM | POA: Diagnosis not present

## 2023-09-19 DIAGNOSIS — Z86718 Personal history of other venous thrombosis and embolism: Secondary | ICD-10-CM | POA: Diagnosis not present

## 2023-09-19 DIAGNOSIS — F1290 Cannabis use, unspecified, uncomplicated: Secondary | ICD-10-CM | POA: Diagnosis not present

## 2023-10-08 DIAGNOSIS — M79672 Pain in left foot: Secondary | ICD-10-CM | POA: Diagnosis not present

## 2023-10-08 DIAGNOSIS — I825Y2 Chronic embolism and thrombosis of unspecified deep veins of left proximal lower extremity: Secondary | ICD-10-CM | POA: Diagnosis not present

## 2023-10-08 DIAGNOSIS — F17200 Nicotine dependence, unspecified, uncomplicated: Secondary | ICD-10-CM | POA: Diagnosis not present

## 2023-10-08 DIAGNOSIS — L03116 Cellulitis of left lower limb: Secondary | ICD-10-CM | POA: Diagnosis not present

## 2023-10-08 DIAGNOSIS — Z7901 Long term (current) use of anticoagulants: Secondary | ICD-10-CM | POA: Diagnosis not present

## 2023-10-15 DIAGNOSIS — R609 Edema, unspecified: Secondary | ICD-10-CM | POA: Diagnosis not present

## 2023-10-15 DIAGNOSIS — R2242 Localized swelling, mass and lump, left lower limb: Secondary | ICD-10-CM | POA: Diagnosis not present

## 2023-10-15 DIAGNOSIS — M79662 Pain in left lower leg: Secondary | ICD-10-CM | POA: Diagnosis not present

## 2023-10-15 DIAGNOSIS — F419 Anxiety disorder, unspecified: Secondary | ICD-10-CM | POA: Diagnosis not present

## 2023-10-15 DIAGNOSIS — I517 Cardiomegaly: Secondary | ICD-10-CM | POA: Diagnosis not present

## 2023-10-15 DIAGNOSIS — F17210 Nicotine dependence, cigarettes, uncomplicated: Secondary | ICD-10-CM | POA: Diagnosis not present

## 2023-10-15 DIAGNOSIS — R0989 Other specified symptoms and signs involving the circulatory and respiratory systems: Secondary | ICD-10-CM | POA: Diagnosis not present

## 2023-10-15 DIAGNOSIS — M7989 Other specified soft tissue disorders: Secondary | ICD-10-CM | POA: Diagnosis not present

## 2023-10-15 DIAGNOSIS — I509 Heart failure, unspecified: Secondary | ICD-10-CM | POA: Diagnosis not present

## 2023-10-15 DIAGNOSIS — Z856 Personal history of leukemia: Secondary | ICD-10-CM | POA: Diagnosis not present

## 2023-10-15 DIAGNOSIS — R7989 Other specified abnormal findings of blood chemistry: Secondary | ICD-10-CM | POA: Diagnosis not present

## 2023-10-15 DIAGNOSIS — Z79899 Other long term (current) drug therapy: Secondary | ICD-10-CM | POA: Diagnosis not present

## 2023-10-15 DIAGNOSIS — R6883 Chills (without fever): Secondary | ICD-10-CM | POA: Diagnosis not present

## 2023-10-15 DIAGNOSIS — M79605 Pain in left leg: Secondary | ICD-10-CM | POA: Diagnosis not present

## 2023-10-15 DIAGNOSIS — E119 Type 2 diabetes mellitus without complications: Secondary | ICD-10-CM | POA: Diagnosis not present

## 2023-10-16 DIAGNOSIS — R2242 Localized swelling, mass and lump, left lower limb: Secondary | ICD-10-CM | POA: Diagnosis not present

## 2023-10-16 DIAGNOSIS — M7989 Other specified soft tissue disorders: Secondary | ICD-10-CM | POA: Diagnosis not present

## 2023-10-16 DIAGNOSIS — Z7901 Long term (current) use of anticoagulants: Secondary | ICD-10-CM | POA: Diagnosis not present

## 2023-10-16 DIAGNOSIS — F419 Anxiety disorder, unspecified: Secondary | ICD-10-CM | POA: Diagnosis not present

## 2023-10-16 DIAGNOSIS — I480 Paroxysmal atrial fibrillation: Secondary | ICD-10-CM | POA: Diagnosis not present

## 2023-10-16 DIAGNOSIS — R600 Localized edema: Secondary | ICD-10-CM | POA: Diagnosis not present

## 2023-10-16 DIAGNOSIS — I82412 Acute embolism and thrombosis of left femoral vein: Secondary | ICD-10-CM | POA: Diagnosis not present

## 2023-10-16 DIAGNOSIS — C9110 Chronic lymphocytic leukemia of B-cell type not having achieved remission: Secondary | ICD-10-CM | POA: Diagnosis not present

## 2023-10-16 DIAGNOSIS — I959 Hypotension, unspecified: Secondary | ICD-10-CM | POA: Diagnosis not present

## 2023-10-16 DIAGNOSIS — I509 Heart failure, unspecified: Secondary | ICD-10-CM | POA: Diagnosis not present

## 2023-10-16 DIAGNOSIS — M79605 Pain in left leg: Secondary | ICD-10-CM | POA: Diagnosis not present

## 2023-10-16 DIAGNOSIS — E119 Type 2 diabetes mellitus without complications: Secondary | ICD-10-CM | POA: Diagnosis not present

## 2023-10-16 DIAGNOSIS — E8770 Fluid overload, unspecified: Secondary | ICD-10-CM | POA: Diagnosis not present

## 2023-10-16 DIAGNOSIS — F17210 Nicotine dependence, cigarettes, uncomplicated: Secondary | ICD-10-CM | POA: Diagnosis not present

## 2023-10-17 DIAGNOSIS — F17210 Nicotine dependence, cigarettes, uncomplicated: Secondary | ICD-10-CM | POA: Diagnosis not present

## 2023-10-17 DIAGNOSIS — E8770 Fluid overload, unspecified: Secondary | ICD-10-CM | POA: Diagnosis not present

## 2023-10-17 DIAGNOSIS — C9110 Chronic lymphocytic leukemia of B-cell type not having achieved remission: Secondary | ICD-10-CM | POA: Diagnosis not present

## 2023-10-17 DIAGNOSIS — Z79899 Other long term (current) drug therapy: Secondary | ICD-10-CM | POA: Diagnosis not present

## 2023-10-17 DIAGNOSIS — I480 Paroxysmal atrial fibrillation: Secondary | ICD-10-CM | POA: Diagnosis not present

## 2023-10-17 DIAGNOSIS — E119 Type 2 diabetes mellitus without complications: Secondary | ICD-10-CM | POA: Diagnosis not present

## 2023-10-17 DIAGNOSIS — I82412 Acute embolism and thrombosis of left femoral vein: Secondary | ICD-10-CM | POA: Diagnosis not present

## 2023-10-17 DIAGNOSIS — I959 Hypotension, unspecified: Secondary | ICD-10-CM | POA: Diagnosis not present

## 2023-10-17 DIAGNOSIS — I5030 Unspecified diastolic (congestive) heart failure: Secondary | ICD-10-CM | POA: Diagnosis not present

## 2023-10-18 DIAGNOSIS — Z7901 Long term (current) use of anticoagulants: Secondary | ICD-10-CM | POA: Diagnosis not present

## 2023-10-18 DIAGNOSIS — N4889 Other specified disorders of penis: Secondary | ICD-10-CM | POA: Diagnosis not present

## 2023-10-18 DIAGNOSIS — I825Y2 Chronic embolism and thrombosis of unspecified deep veins of left proximal lower extremity: Secondary | ICD-10-CM | POA: Diagnosis not present

## 2023-10-18 DIAGNOSIS — R600 Localized edema: Secondary | ICD-10-CM | POA: Diagnosis not present

## 2023-10-18 DIAGNOSIS — Z91148 Patient's other noncompliance with medication regimen for other reason: Secondary | ICD-10-CM | POA: Diagnosis not present

## 2023-10-18 DIAGNOSIS — N5089 Other specified disorders of the male genital organs: Secondary | ICD-10-CM | POA: Diagnosis not present

## 2023-10-20 DIAGNOSIS — R609 Edema, unspecified: Secondary | ICD-10-CM | POA: Diagnosis not present

## 2023-10-20 DIAGNOSIS — R0989 Other specified symptoms and signs involving the circulatory and respiratory systems: Secondary | ICD-10-CM | POA: Diagnosis not present

## 2023-10-20 DIAGNOSIS — I2489 Other forms of acute ischemic heart disease: Secondary | ICD-10-CM | POA: Diagnosis not present

## 2023-10-20 DIAGNOSIS — I480 Paroxysmal atrial fibrillation: Secondary | ICD-10-CM | POA: Diagnosis not present

## 2023-10-20 DIAGNOSIS — R531 Weakness: Secondary | ICD-10-CM | POA: Diagnosis not present

## 2023-10-20 DIAGNOSIS — T699XXA Effect of reduced temperature, unspecified, initial encounter: Secondary | ICD-10-CM | POA: Diagnosis not present

## 2023-10-20 DIAGNOSIS — I82409 Acute embolism and thrombosis of unspecified deep veins of unspecified lower extremity: Secondary | ICD-10-CM | POA: Diagnosis not present

## 2023-10-20 DIAGNOSIS — X130XXA Inhalation of steam and other hot vapors, initial encounter: Secondary | ICD-10-CM | POA: Diagnosis not present

## 2023-10-20 DIAGNOSIS — Z7901 Long term (current) use of anticoagulants: Secondary | ICD-10-CM | POA: Diagnosis not present

## 2023-10-20 DIAGNOSIS — I10 Essential (primary) hypertension: Secondary | ICD-10-CM | POA: Diagnosis not present

## 2023-10-20 DIAGNOSIS — Z7984 Long term (current) use of oral hypoglycemic drugs: Secondary | ICD-10-CM | POA: Diagnosis not present

## 2023-10-20 DIAGNOSIS — I110 Hypertensive heart disease with heart failure: Secondary | ICD-10-CM | POA: Diagnosis not present

## 2023-10-20 DIAGNOSIS — C9110 Chronic lymphocytic leukemia of B-cell type not having achieved remission: Secondary | ICD-10-CM | POA: Diagnosis not present

## 2023-10-20 DIAGNOSIS — R059 Cough, unspecified: Secondary | ICD-10-CM | POA: Diagnosis not present

## 2023-10-20 DIAGNOSIS — E1165 Type 2 diabetes mellitus with hyperglycemia: Secondary | ICD-10-CM | POA: Diagnosis not present

## 2023-10-20 DIAGNOSIS — J449 Chronic obstructive pulmonary disease, unspecified: Secondary | ICD-10-CM | POA: Diagnosis not present

## 2023-10-20 DIAGNOSIS — F17210 Nicotine dependence, cigarettes, uncomplicated: Secondary | ICD-10-CM | POA: Diagnosis not present

## 2023-10-20 DIAGNOSIS — I5023 Acute on chronic systolic (congestive) heart failure: Secondary | ICD-10-CM | POA: Diagnosis not present

## 2023-10-20 DIAGNOSIS — R600 Localized edema: Secondary | ICD-10-CM | POA: Diagnosis not present

## 2023-10-20 DIAGNOSIS — Z20822 Contact with and (suspected) exposure to covid-19: Secondary | ICD-10-CM | POA: Diagnosis not present

## 2023-10-20 DIAGNOSIS — Z79899 Other long term (current) drug therapy: Secondary | ICD-10-CM | POA: Diagnosis not present

## 2023-10-20 DIAGNOSIS — I82402 Acute embolism and thrombosis of unspecified deep veins of left lower extremity: Secondary | ICD-10-CM | POA: Diagnosis not present

## 2023-10-20 DIAGNOSIS — R000 Tachycardia, unspecified: Secondary | ICD-10-CM | POA: Diagnosis not present

## 2023-10-20 DIAGNOSIS — R601 Generalized edema: Secondary | ICD-10-CM | POA: Diagnosis not present

## 2023-10-20 DIAGNOSIS — I5040 Unspecified combined systolic (congestive) and diastolic (congestive) heart failure: Secondary | ICD-10-CM | POA: Diagnosis not present

## 2023-10-20 DIAGNOSIS — R0602 Shortness of breath: Secondary | ICD-10-CM | POA: Diagnosis not present

## 2023-10-20 DIAGNOSIS — I82412 Acute embolism and thrombosis of left femoral vein: Secondary | ICD-10-CM | POA: Diagnosis not present

## 2023-10-20 DIAGNOSIS — I340 Nonrheumatic mitral (valve) insufficiency: Secondary | ICD-10-CM | POA: Diagnosis not present

## 2023-10-20 DIAGNOSIS — E1140 Type 2 diabetes mellitus with diabetic neuropathy, unspecified: Secondary | ICD-10-CM | POA: Diagnosis not present

## 2023-10-20 DIAGNOSIS — I509 Heart failure, unspecified: Secondary | ICD-10-CM | POA: Diagnosis not present

## 2023-10-20 DIAGNOSIS — R42 Dizziness and giddiness: Secondary | ICD-10-CM | POA: Diagnosis not present

## 2023-10-20 DIAGNOSIS — Z720 Tobacco use: Secondary | ICD-10-CM | POA: Diagnosis not present

## 2023-10-21 DIAGNOSIS — I5023 Acute on chronic systolic (congestive) heart failure: Secondary | ICD-10-CM | POA: Diagnosis not present

## 2023-10-23 DIAGNOSIS — R42 Dizziness and giddiness: Secondary | ICD-10-CM | POA: Diagnosis not present

## 2023-10-23 DIAGNOSIS — E1140 Type 2 diabetes mellitus with diabetic neuropathy, unspecified: Secondary | ICD-10-CM | POA: Diagnosis not present

## 2023-10-23 DIAGNOSIS — R531 Weakness: Secondary | ICD-10-CM | POA: Diagnosis not present

## 2023-10-23 DIAGNOSIS — X130XXA Inhalation of steam and other hot vapors, initial encounter: Secondary | ICD-10-CM | POA: Diagnosis not present

## 2023-10-23 DIAGNOSIS — R600 Localized edema: Secondary | ICD-10-CM | POA: Diagnosis not present

## 2023-10-23 DIAGNOSIS — T699XXA Effect of reduced temperature, unspecified, initial encounter: Secondary | ICD-10-CM | POA: Diagnosis not present

## 2023-10-23 DIAGNOSIS — I480 Paroxysmal atrial fibrillation: Secondary | ICD-10-CM | POA: Diagnosis not present

## 2023-10-23 DIAGNOSIS — Z7984 Long term (current) use of oral hypoglycemic drugs: Secondary | ICD-10-CM | POA: Diagnosis not present

## 2023-10-23 DIAGNOSIS — I509 Heart failure, unspecified: Secondary | ICD-10-CM | POA: Diagnosis not present

## 2023-10-23 DIAGNOSIS — Z720 Tobacco use: Secondary | ICD-10-CM | POA: Diagnosis not present

## 2023-10-23 DIAGNOSIS — F17210 Nicotine dependence, cigarettes, uncomplicated: Secondary | ICD-10-CM | POA: Diagnosis not present

## 2023-10-23 DIAGNOSIS — I82412 Acute embolism and thrombosis of left femoral vein: Secondary | ICD-10-CM | POA: Diagnosis not present

## 2023-11-03 DIAGNOSIS — I959 Hypotension, unspecified: Secondary | ICD-10-CM | POA: Diagnosis not present

## 2023-11-03 DIAGNOSIS — R079 Chest pain, unspecified: Secondary | ICD-10-CM | POA: Diagnosis not present

## 2023-11-03 DIAGNOSIS — R0989 Other specified symptoms and signs involving the circulatory and respiratory systems: Secondary | ICD-10-CM | POA: Diagnosis not present

## 2023-11-03 DIAGNOSIS — C9190 Lymphoid leukemia, unspecified not having achieved remission: Secondary | ICD-10-CM | POA: Diagnosis not present

## 2023-11-03 DIAGNOSIS — F1020 Alcohol dependence, uncomplicated: Secondary | ICD-10-CM | POA: Diagnosis not present

## 2023-11-03 DIAGNOSIS — W1839XA Other fall on same level, initial encounter: Secondary | ICD-10-CM | POA: Diagnosis not present

## 2023-11-03 DIAGNOSIS — R040 Epistaxis: Secondary | ICD-10-CM | POA: Diagnosis not present

## 2023-11-03 DIAGNOSIS — J189 Pneumonia, unspecified organism: Secondary | ICD-10-CM | POA: Diagnosis not present

## 2023-11-03 DIAGNOSIS — R58 Hemorrhage, not elsewhere classified: Secondary | ICD-10-CM | POA: Diagnosis not present

## 2023-11-03 DIAGNOSIS — I517 Cardiomegaly: Secondary | ICD-10-CM | POA: Diagnosis not present

## 2023-11-05 DIAGNOSIS — R531 Weakness: Secondary | ICD-10-CM | POA: Diagnosis not present

## 2023-11-05 DIAGNOSIS — D72829 Elevated white blood cell count, unspecified: Secondary | ICD-10-CM | POA: Diagnosis not present

## 2023-11-05 DIAGNOSIS — I5033 Acute on chronic diastolic (congestive) heart failure: Secondary | ICD-10-CM | POA: Diagnosis not present

## 2023-11-05 DIAGNOSIS — Z8659 Personal history of other mental and behavioral disorders: Secondary | ICD-10-CM | POA: Diagnosis not present

## 2023-11-05 DIAGNOSIS — R0600 Dyspnea, unspecified: Secondary | ICD-10-CM | POA: Diagnosis not present

## 2023-11-05 DIAGNOSIS — I517 Cardiomegaly: Secondary | ICD-10-CM | POA: Diagnosis not present

## 2023-11-05 DIAGNOSIS — Z792 Long term (current) use of antibiotics: Secondary | ICD-10-CM | POA: Diagnosis not present

## 2023-11-05 DIAGNOSIS — R0902 Hypoxemia: Secondary | ICD-10-CM | POA: Diagnosis not present

## 2023-11-05 DIAGNOSIS — Z7901 Long term (current) use of anticoagulants: Secondary | ICD-10-CM | POA: Diagnosis not present

## 2023-11-05 DIAGNOSIS — W19XXXA Unspecified fall, initial encounter: Secondary | ICD-10-CM | POA: Diagnosis not present

## 2023-11-05 DIAGNOSIS — M79606 Pain in leg, unspecified: Secondary | ICD-10-CM | POA: Diagnosis not present

## 2023-11-05 DIAGNOSIS — J849 Interstitial pulmonary disease, unspecified: Secondary | ICD-10-CM | POA: Diagnosis not present

## 2023-11-05 DIAGNOSIS — E119 Type 2 diabetes mellitus without complications: Secondary | ICD-10-CM | POA: Diagnosis not present

## 2023-11-05 DIAGNOSIS — R918 Other nonspecific abnormal finding of lung field: Secondary | ICD-10-CM | POA: Diagnosis not present

## 2023-11-05 DIAGNOSIS — Z91199 Patient's noncompliance with other medical treatment and regimen due to unspecified reason: Secondary | ICD-10-CM | POA: Diagnosis not present

## 2023-11-05 DIAGNOSIS — I509 Heart failure, unspecified: Secondary | ICD-10-CM | POA: Diagnosis not present

## 2023-11-06 DIAGNOSIS — I5043 Acute on chronic combined systolic (congestive) and diastolic (congestive) heart failure: Secondary | ICD-10-CM | POA: Diagnosis not present

## 2023-11-06 DIAGNOSIS — I340 Nonrheumatic mitral (valve) insufficiency: Secondary | ICD-10-CM | POA: Diagnosis not present

## 2023-11-06 DIAGNOSIS — Z7901 Long term (current) use of anticoagulants: Secondary | ICD-10-CM | POA: Diagnosis not present

## 2023-11-06 DIAGNOSIS — D72829 Elevated white blood cell count, unspecified: Secondary | ICD-10-CM | POA: Diagnosis not present

## 2023-11-06 DIAGNOSIS — I5033 Acute on chronic diastolic (congestive) heart failure: Secondary | ICD-10-CM | POA: Diagnosis not present

## 2023-11-06 DIAGNOSIS — Z91199 Patient's noncompliance with other medical treatment and regimen due to unspecified reason: Secondary | ICD-10-CM | POA: Diagnosis not present

## 2023-11-06 DIAGNOSIS — I480 Paroxysmal atrial fibrillation: Secondary | ICD-10-CM | POA: Diagnosis not present

## 2023-11-06 DIAGNOSIS — Z86711 Personal history of pulmonary embolism: Secondary | ICD-10-CM | POA: Diagnosis not present

## 2023-11-06 DIAGNOSIS — I2510 Atherosclerotic heart disease of native coronary artery without angina pectoris: Secondary | ICD-10-CM | POA: Diagnosis not present

## 2023-11-06 DIAGNOSIS — Z720 Tobacco use: Secondary | ICD-10-CM | POA: Diagnosis not present

## 2023-11-06 DIAGNOSIS — Z8659 Personal history of other mental and behavioral disorders: Secondary | ICD-10-CM | POA: Diagnosis not present

## 2023-11-06 DIAGNOSIS — E119 Type 2 diabetes mellitus without complications: Secondary | ICD-10-CM | POA: Diagnosis not present

## 2023-11-06 DIAGNOSIS — J449 Chronic obstructive pulmonary disease, unspecified: Secondary | ICD-10-CM | POA: Diagnosis not present

## 2023-11-06 DIAGNOSIS — Z86718 Personal history of other venous thrombosis and embolism: Secondary | ICD-10-CM | POA: Diagnosis not present

## 2023-11-07 DIAGNOSIS — I4891 Unspecified atrial fibrillation: Secondary | ICD-10-CM | POA: Diagnosis not present

## 2023-11-07 DIAGNOSIS — Z792 Long term (current) use of antibiotics: Secondary | ICD-10-CM | POA: Diagnosis not present

## 2023-11-07 DIAGNOSIS — Z720 Tobacco use: Secondary | ICD-10-CM | POA: Diagnosis not present

## 2023-11-07 DIAGNOSIS — E119 Type 2 diabetes mellitus without complications: Secondary | ICD-10-CM | POA: Diagnosis not present

## 2023-11-07 DIAGNOSIS — I5033 Acute on chronic diastolic (congestive) heart failure: Secondary | ICD-10-CM | POA: Diagnosis not present

## 2023-11-07 DIAGNOSIS — I2510 Atherosclerotic heart disease of native coronary artery without angina pectoris: Secondary | ICD-10-CM | POA: Diagnosis not present

## 2023-11-07 DIAGNOSIS — I480 Paroxysmal atrial fibrillation: Secondary | ICD-10-CM | POA: Diagnosis not present

## 2023-11-07 DIAGNOSIS — I5043 Acute on chronic combined systolic (congestive) and diastolic (congestive) heart failure: Secondary | ICD-10-CM | POA: Diagnosis not present

## 2023-11-07 DIAGNOSIS — Z79899 Other long term (current) drug therapy: Secondary | ICD-10-CM | POA: Diagnosis not present

## 2023-11-07 DIAGNOSIS — Z7901 Long term (current) use of anticoagulants: Secondary | ICD-10-CM | POA: Diagnosis not present

## 2023-11-07 DIAGNOSIS — D72829 Elevated white blood cell count, unspecified: Secondary | ICD-10-CM | POA: Diagnosis not present

## 2023-11-07 DIAGNOSIS — Z8669 Personal history of other diseases of the nervous system and sense organs: Secondary | ICD-10-CM | POA: Diagnosis not present

## 2023-11-07 DIAGNOSIS — Z8659 Personal history of other mental and behavioral disorders: Secondary | ICD-10-CM | POA: Diagnosis not present

## 2023-11-07 DIAGNOSIS — Z91199 Patient's noncompliance with other medical treatment and regimen due to unspecified reason: Secondary | ICD-10-CM | POA: Diagnosis not present

## 2023-11-08 DIAGNOSIS — Z720 Tobacco use: Secondary | ICD-10-CM | POA: Diagnosis not present

## 2023-11-08 DIAGNOSIS — I5043 Acute on chronic combined systolic (congestive) and diastolic (congestive) heart failure: Secondary | ICD-10-CM | POA: Diagnosis not present

## 2023-11-08 DIAGNOSIS — Z8659 Personal history of other mental and behavioral disorders: Secondary | ICD-10-CM | POA: Diagnosis not present

## 2023-11-08 DIAGNOSIS — I700 Atherosclerosis of aorta: Secondary | ICD-10-CM | POA: Diagnosis not present

## 2023-11-08 DIAGNOSIS — Z86718 Personal history of other venous thrombosis and embolism: Secondary | ICD-10-CM | POA: Diagnosis not present

## 2023-11-08 DIAGNOSIS — I480 Paroxysmal atrial fibrillation: Secondary | ICD-10-CM | POA: Diagnosis not present

## 2023-11-08 DIAGNOSIS — R0989 Other specified symptoms and signs involving the circulatory and respiratory systems: Secondary | ICD-10-CM | POA: Diagnosis not present

## 2023-11-08 DIAGNOSIS — Z8679 Personal history of other diseases of the circulatory system: Secondary | ICD-10-CM | POA: Diagnosis not present

## 2023-11-08 DIAGNOSIS — Z86711 Personal history of pulmonary embolism: Secondary | ICD-10-CM | POA: Diagnosis not present

## 2023-11-08 DIAGNOSIS — I5033 Acute on chronic diastolic (congestive) heart failure: Secondary | ICD-10-CM | POA: Diagnosis not present

## 2023-11-08 DIAGNOSIS — J449 Chronic obstructive pulmonary disease, unspecified: Secondary | ICD-10-CM | POA: Diagnosis not present

## 2023-11-08 DIAGNOSIS — J90 Pleural effusion, not elsewhere classified: Secondary | ICD-10-CM | POA: Diagnosis not present

## 2023-11-08 DIAGNOSIS — Z91199 Patient's noncompliance with other medical treatment and regimen due to unspecified reason: Secondary | ICD-10-CM | POA: Diagnosis not present

## 2023-11-08 DIAGNOSIS — D72829 Elevated white blood cell count, unspecified: Secondary | ICD-10-CM | POA: Diagnosis not present

## 2023-11-08 DIAGNOSIS — I340 Nonrheumatic mitral (valve) insufficiency: Secondary | ICD-10-CM | POA: Diagnosis not present

## 2023-11-08 DIAGNOSIS — E119 Type 2 diabetes mellitus without complications: Secondary | ICD-10-CM | POA: Diagnosis not present

## 2023-11-08 DIAGNOSIS — J811 Chronic pulmonary edema: Secondary | ICD-10-CM | POA: Diagnosis not present

## 2023-11-09 DIAGNOSIS — Z79899 Other long term (current) drug therapy: Secondary | ICD-10-CM | POA: Diagnosis not present

## 2023-11-09 DIAGNOSIS — I5033 Acute on chronic diastolic (congestive) heart failure: Secondary | ICD-10-CM | POA: Diagnosis not present

## 2023-11-09 DIAGNOSIS — Z8659 Personal history of other mental and behavioral disorders: Secondary | ICD-10-CM | POA: Diagnosis not present

## 2023-11-09 DIAGNOSIS — Z86711 Personal history of pulmonary embolism: Secondary | ICD-10-CM | POA: Diagnosis not present

## 2023-11-09 DIAGNOSIS — Z86718 Personal history of other venous thrombosis and embolism: Secondary | ICD-10-CM | POA: Diagnosis not present

## 2023-11-09 DIAGNOSIS — Z91199 Patient's noncompliance with other medical treatment and regimen due to unspecified reason: Secondary | ICD-10-CM | POA: Diagnosis not present

## 2023-11-09 DIAGNOSIS — D72829 Elevated white blood cell count, unspecified: Secondary | ICD-10-CM | POA: Diagnosis not present

## 2023-11-09 DIAGNOSIS — E119 Type 2 diabetes mellitus without complications: Secondary | ICD-10-CM | POA: Diagnosis not present

## 2023-12-10 DEATH — deceased
